# Patient Record
Sex: Male | Born: 1945
Health system: Southern US, Community
[De-identification: ages and names within clinical notes are randomized; demographics above are authoritative.]

## PROBLEM LIST (undated history)

## (undated) ENCOUNTER — Emergency Department (HOSPITAL_COMMUNITY): Admission: EM | Payer: Medicare Other

## (undated) ENCOUNTER — Emergency Department (HOSPITAL_COMMUNITY): Payer: Medicare Other

## (undated) DIAGNOSIS — Z8719 Personal history of other diseases of the digestive system: Secondary | ICD-10-CM

## (undated) DIAGNOSIS — F1910 Other psychoactive substance abuse, uncomplicated: Secondary | ICD-10-CM

## (undated) DIAGNOSIS — I10 Essential (primary) hypertension: Secondary | ICD-10-CM

## (undated) DIAGNOSIS — E119 Type 2 diabetes mellitus without complications: Secondary | ICD-10-CM

## (undated) DIAGNOSIS — M1990 Unspecified osteoarthritis, unspecified site: Secondary | ICD-10-CM

## (undated) DIAGNOSIS — F32A Depression, unspecified: Secondary | ICD-10-CM

## (undated) DIAGNOSIS — F329 Major depressive disorder, single episode, unspecified: Secondary | ICD-10-CM

## (undated) DIAGNOSIS — R569 Unspecified convulsions: Secondary | ICD-10-CM

## (undated) DIAGNOSIS — Z8711 Personal history of peptic ulcer disease: Secondary | ICD-10-CM

## (undated) DIAGNOSIS — C801 Malignant (primary) neoplasm, unspecified: Secondary | ICD-10-CM

## (undated) DIAGNOSIS — D649 Anemia, unspecified: Secondary | ICD-10-CM

## (undated) HISTORY — DX: Personal history of other diseases of the digestive system: Z87.19

## (undated) HISTORY — DX: Other psychoactive substance abuse, uncomplicated: F19.10

## (undated) HISTORY — DX: Anemia, unspecified: D64.9

## (undated) HISTORY — DX: Major depressive disorder, single episode, unspecified: F32.9

## (undated) HISTORY — PX: OTHER SURGICAL HISTORY: SHX169

## (undated) HISTORY — DX: Depression, unspecified: F32.A

## (undated) HISTORY — DX: Unspecified convulsions: R56.9

## (undated) HISTORY — DX: Unspecified osteoarthritis, unspecified site: M19.90

## (undated) HISTORY — DX: Malignant (primary) neoplasm, unspecified: C80.1

## (undated) HISTORY — DX: Type 2 diabetes mellitus without complications: E11.9

## (undated) HISTORY — DX: Personal history of peptic ulcer disease: Z87.11

## (undated) HISTORY — DX: Essential (primary) hypertension: I10

## (undated) HISTORY — PX: KNEE SURGERY: SHX244

---

## 2004-10-24 ENCOUNTER — Ambulatory Visit: Payer: Self-pay | Admitting: Family Medicine

## 2004-10-24 ENCOUNTER — Ambulatory Visit: Payer: Self-pay | Admitting: Internal Medicine

## 2004-10-31 ENCOUNTER — Ambulatory Visit: Payer: Self-pay | Admitting: Internal Medicine

## 2004-10-31 ENCOUNTER — Ambulatory Visit (HOSPITAL_COMMUNITY): Admission: RE | Admit: 2004-10-31 | Discharge: 2004-10-31 | Payer: Self-pay | Admitting: Internal Medicine

## 2004-11-03 ENCOUNTER — Ambulatory Visit: Payer: Self-pay | Admitting: *Deleted

## 2004-11-28 ENCOUNTER — Ambulatory Visit: Payer: Self-pay | Admitting: Internal Medicine

## 2004-11-30 ENCOUNTER — Ambulatory Visit: Payer: Self-pay | Admitting: Internal Medicine

## 2005-05-19 ENCOUNTER — Ambulatory Visit: Payer: Self-pay | Admitting: Internal Medicine

## 2005-07-03 ENCOUNTER — Ambulatory Visit: Payer: Self-pay | Admitting: Internal Medicine

## 2006-02-09 ENCOUNTER — Ambulatory Visit: Payer: Self-pay | Admitting: Internal Medicine

## 2008-02-11 ENCOUNTER — Ambulatory Visit: Payer: Self-pay | Admitting: Internal Medicine

## 2008-02-11 DIAGNOSIS — K029 Dental caries, unspecified: Secondary | ICD-10-CM | POA: Insufficient documentation

## 2008-02-11 DIAGNOSIS — F1010 Alcohol abuse, uncomplicated: Secondary | ICD-10-CM | POA: Insufficient documentation

## 2008-02-11 DIAGNOSIS — M069 Rheumatoid arthritis, unspecified: Secondary | ICD-10-CM | POA: Insufficient documentation

## 2008-02-13 DIAGNOSIS — I10 Essential (primary) hypertension: Secondary | ICD-10-CM | POA: Insufficient documentation

## 2008-02-22 LAB — CONVERTED CEMR LAB
ANA Titer 1: 1:160 {titer} — ABNORMAL HIGH
Anti Nuclear Antibody(ANA): POSITIVE — AB
Rheumatoid fact SerPl-aCnc: <20 intl units/mL (ref 0–20)
Sed Rate: 7 mm/hr (ref 0–16)

## 2008-05-29 ENCOUNTER — Ambulatory Visit: Payer: Self-pay | Admitting: Internal Medicine

## 2008-05-29 DIAGNOSIS — R1013 Epigastric pain: Secondary | ICD-10-CM

## 2008-05-29 LAB — CONVERTED CEMR LAB
Bilirubin Urine: NEGATIVE
Blood in Urine, dipstick: NEGATIVE
Glucose, Urine, Semiquant: NEGATIVE
Ketones, urine, test strip: NEGATIVE
Nitrite: NEGATIVE
Protein, U semiquant: 30
Specific Gravity, Urine: 1.02
Urobilinogen, UA: 0.2
WBC Urine, dipstick: NEGATIVE
pH: 5

## 2008-06-05 LAB — CONVERTED CEMR LAB
ALT: 35 units/L (ref 0–53)
AST: 66 units/L — ABNORMAL HIGH (ref 0–37)
Albumin: 4.5 g/dL (ref 3.5–5.2)
Alkaline Phosphatase: 93 units/L (ref 39–117)
BUN: 5 mg/dL — ABNORMAL LOW (ref 6–23)
Basophils Absolute: 0 10*3/uL (ref 0.0–0.1)
Basophils Relative: 1 % (ref 0–1)
CO2: 21 meq/L (ref 19–32)
Calcium: 8.6 mg/dL (ref 8.4–10.5)
Chloride: 103 meq/L (ref 96–112)
Cholesterol: 147 mg/dL (ref 0–200)
Creatinine, Ser: 0.63 mg/dL (ref 0.40–1.50)
Eosinophils Absolute: 0.1 10*3/uL (ref 0.0–0.7)
Eosinophils Relative: 1 % (ref 0–5)
Glucose, Bld: 94 mg/dL (ref 70–99)
HCT: 36.2 % — ABNORMAL LOW (ref 39.0–52.0)
HDL: 63 mg/dL (ref >39)
Hemoglobin: 11.6 g/dL — ABNORMAL LOW (ref 13.0–17.0)
LDL Cholesterol: 21 mg/dL (ref 0–99)
Lymphocytes Relative: 65 % — ABNORMAL HIGH (ref 12–46)
Lymphs Abs: 3.8 10*3/uL (ref 0.7–4.0)
MCHC: 32 g/dL (ref 30.0–36.0)
MCV: 105.8 fL — ABNORMAL HIGH (ref 78.0–100.0)
Monocytes Absolute: 0.3 10*3/uL (ref 0.1–1.0)
Monocytes Relative: 6 % (ref 3–12)
Neutro Abs: 1.6 10*3/uL — ABNORMAL LOW (ref 1.7–7.7)
Neutrophils Relative %: 28 % — ABNORMAL LOW (ref 43–77)
PSA: 3 ng/mL (ref 0.10–4.00)
Platelets: 202 10*3/uL (ref 150–400)
Potassium: 4.2 meq/L (ref 3.5–5.3)
RBC: 3.42 M/uL — ABNORMAL LOW (ref 4.22–5.81)
RDW: 17.3 % — ABNORMAL HIGH (ref 11.5–15.5)
Sodium: 144 meq/L (ref 135–145)
Total Bilirubin: 1 mg/dL (ref 0.3–1.2)
Total CHOL/HDL Ratio: 2.3
Total Protein: 8.3 g/dL (ref 6.0–8.3)
Triglycerides: 315 mg/dL — ABNORMAL HIGH (ref <150)
VLDL: 63 mg/dL — ABNORMAL HIGH (ref 0–40)
WBC: 5.8 10*3/uL (ref 4.0–10.5)

## 2008-06-09 ENCOUNTER — Telehealth (INDEPENDENT_AMBULATORY_CARE_PROVIDER_SITE_OTHER): Payer: Self-pay | Admitting: Internal Medicine

## 2008-06-10 ENCOUNTER — Ambulatory Visit: Payer: Self-pay | Admitting: Internal Medicine

## 2008-06-10 LAB — CONVERTED CEMR LAB
Ferritin: 1386 ng/mL — ABNORMAL HIGH (ref 22–322)
Iron: 26 ug/dL — ABNORMAL LOW (ref 42–165)
Iron: 26 ug/dL — ABNORMAL LOW (ref 42–165)
RBC Folate: 413 ng/mL (ref 180–600)
Saturation Ratios: 11 % — ABNORMAL LOW (ref 20–55)
Saturation Ratios: 11 % — ABNORMAL LOW (ref 20–55)
TIBC: 242 ug/dL (ref 215–435)
TIBC: 242 ug/dL (ref 215–435)
UIBC: 216 ug/dL
UIBC: 216 ug/dL
Vitamin B-12: 920 pg/mL — ABNORMAL HIGH (ref 211–911)

## 2008-06-15 ENCOUNTER — Telehealth (INDEPENDENT_AMBULATORY_CARE_PROVIDER_SITE_OTHER): Payer: Self-pay | Admitting: Internal Medicine

## 2008-06-23 ENCOUNTER — Ambulatory Visit: Payer: Self-pay | Admitting: Internal Medicine

## 2008-06-23 DIAGNOSIS — D509 Iron deficiency anemia, unspecified: Secondary | ICD-10-CM

## 2008-06-26 ENCOUNTER — Telehealth (INDEPENDENT_AMBULATORY_CARE_PROVIDER_SITE_OTHER): Payer: Self-pay | Admitting: Internal Medicine

## 2008-07-09 ENCOUNTER — Emergency Department: Payer: Self-pay | Admitting: Emergency Medicine

## 2008-07-13 ENCOUNTER — Emergency Department (HOSPITAL_COMMUNITY): Admission: EM | Admit: 2008-07-13 | Discharge: 2008-07-13 | Payer: Self-pay | Admitting: Emergency Medicine

## 2008-07-14 ENCOUNTER — Telehealth (INDEPENDENT_AMBULATORY_CARE_PROVIDER_SITE_OTHER): Payer: Self-pay | Admitting: Internal Medicine

## 2008-07-14 ENCOUNTER — Encounter (INDEPENDENT_AMBULATORY_CARE_PROVIDER_SITE_OTHER): Payer: Self-pay | Admitting: *Deleted

## 2008-07-20 ENCOUNTER — Telehealth (INDEPENDENT_AMBULATORY_CARE_PROVIDER_SITE_OTHER): Payer: Self-pay | Admitting: Internal Medicine

## 2008-07-21 ENCOUNTER — Emergency Department (HOSPITAL_COMMUNITY): Admission: EM | Admit: 2008-07-21 | Discharge: 2008-07-22 | Payer: Self-pay | Admitting: Emergency Medicine

## 2008-07-21 ENCOUNTER — Emergency Department (HOSPITAL_COMMUNITY): Admission: EM | Admit: 2008-07-21 | Discharge: 2008-07-21 | Payer: Self-pay | Admitting: Emergency Medicine

## 2008-07-22 ENCOUNTER — Encounter (INDEPENDENT_AMBULATORY_CARE_PROVIDER_SITE_OTHER): Payer: Self-pay | Admitting: Internal Medicine

## 2008-08-06 ENCOUNTER — Ambulatory Visit: Payer: Self-pay | Admitting: Internal Medicine

## 2008-09-11 ENCOUNTER — Ambulatory Visit: Payer: Self-pay | Admitting: Internal Medicine

## 2008-10-06 LAB — CONVERTED CEMR LAB
ALT: 19 units/L (ref 0–53)
AST: 25 units/L (ref 0–37)
Albumin: 4 g/dL (ref 3.5–5.2)
Alkaline Phosphatase: 81 units/L (ref 39–117)
BUN: 8 mg/dL (ref 6–23)
Basophils Absolute: 0 10*3/uL (ref 0.0–0.1)
Basophils Relative: 1 % (ref 0–1)
CO2: 25 meq/L (ref 19–32)
Calcium: 9.2 mg/dL (ref 8.4–10.5)
Chloride: 103 meq/L (ref 96–112)
Creatinine, Ser: 0.7 mg/dL (ref 0.40–1.50)
Eosinophils Absolute: 0 10*3/uL (ref 0.0–0.7)
Eosinophils Relative: 1 % (ref 0–5)
Glucose, Bld: 86 mg/dL (ref 70–99)
HCT: 36.2 % — ABNORMAL LOW (ref 39.0–52.0)
Hemoglobin: 10.8 g/dL — ABNORMAL LOW (ref 13.0–17.0)
Lymphocytes Relative: 38 % (ref 12–46)
Lymphs Abs: 2.3 10*3/uL (ref 0.7–4.0)
MCHC: 29.8 g/dL — ABNORMAL LOW (ref 30.0–36.0)
MCV: 97.8 fL (ref 78.0–100.0)
Monocytes Absolute: 0.6 10*3/uL (ref 0.1–1.0)
Monocytes Relative: 10 % (ref 3–12)
Neutro Abs: 3.2 10*3/uL (ref 1.7–7.7)
Neutrophils Relative %: 52 % (ref 43–77)
Platelets: 229 10*3/uL (ref 150–400)
Potassium: 4.3 meq/L (ref 3.5–5.3)
RBC: 3.7 M/uL — ABNORMAL LOW (ref 4.22–5.81)
RDW: 14.1 % (ref 11.5–15.5)
Sodium: 143 meq/L (ref 135–145)
Total Bilirubin: 0.7 mg/dL (ref 0.3–1.2)
Total Protein: 7.3 g/dL (ref 6.0–8.3)
WBC: 6.2 10*3/uL (ref 4.0–10.5)

## 2009-06-11 ENCOUNTER — Encounter: Payer: Self-pay | Admitting: Family Medicine

## 2009-06-11 ENCOUNTER — Ambulatory Visit: Payer: Self-pay | Admitting: Internal Medicine

## 2009-06-11 DIAGNOSIS — M25569 Pain in unspecified knee: Secondary | ICD-10-CM | POA: Insufficient documentation

## 2009-06-11 DIAGNOSIS — M542 Cervicalgia: Secondary | ICD-10-CM | POA: Insufficient documentation

## 2009-06-11 DIAGNOSIS — R413 Other amnesia: Secondary | ICD-10-CM | POA: Insufficient documentation

## 2009-06-14 DIAGNOSIS — E079 Disorder of thyroid, unspecified: Secondary | ICD-10-CM | POA: Insufficient documentation

## 2009-06-16 ENCOUNTER — Encounter (INDEPENDENT_AMBULATORY_CARE_PROVIDER_SITE_OTHER): Payer: Self-pay | Admitting: Internal Medicine

## 2009-06-17 LAB — CONVERTED CEMR LAB
Free T4: 0.94 ng/dL
T3 Uptake Ratio: 32.7 %

## 2009-06-25 LAB — CONVERTED CEMR LAB
ALT: 22 units/L (ref 0–53)
AST: 25 units/L (ref 0–37)
Albumin: 3.7 g/dL (ref 3.5–5.2)
Alkaline Phosphatase: 65 units/L (ref 39–117)
BUN: 9 mg/dL (ref 6–23)
Basophils Absolute: 0 10*3/uL (ref 0.0–0.1)
Basophils Relative: 0 % (ref 0–1)
CO2: 26 meq/L (ref 19–32)
Calcium: 9.1 mg/dL (ref 8.4–10.5)
Chloride: 99 meq/L (ref 96–112)
Cholesterol: 144 mg/dL (ref 0–200)
Creatinine, Ser: 0.63 mg/dL (ref 0.40–1.50)
Eosinophils Absolute: 0.1 10*3/uL (ref 0.0–0.7)
Eosinophils Relative: 2 % (ref 0–5)
Glucose, Bld: 82 mg/dL (ref 70–99)
HCT: 37.3 % — ABNORMAL LOW (ref 39.0–52.0)
HDL: 27 mg/dL — ABNORMAL LOW (ref >39)
Hemoglobin: 11.7 g/dL — ABNORMAL LOW (ref 13.0–17.0)
LDL Cholesterol: 103 mg/dL — ABNORMAL HIGH (ref 0–99)
Lymphocytes Relative: 34 % (ref 12–46)
Lymphs Abs: 1.8 10*3/uL (ref 0.7–4.0)
MCHC: 31.4 g/dL (ref 30.0–36.0)
MCV: 100 fL (ref 78.0–100.0)
Monocytes Absolute: 0.6 10*3/uL (ref 0.1–1.0)
Monocytes Relative: 11 % (ref 3–12)
Neutro Abs: 2.9 10*3/uL (ref 1.7–7.7)
Neutrophils Relative %: 53 % (ref 43–77)
Platelets: 238 10*3/uL (ref 150–400)
Potassium: 3.9 meq/L (ref 3.5–5.3)
RBC: 3.73 M/uL — ABNORMAL LOW (ref 4.22–5.81)
RDW: 13.1 % (ref 11.5–15.5)
Sodium: 141 meq/L (ref 135–145)
TSH: 5.173 microintl units/mL — ABNORMAL HIGH (ref 0.350–4.500)
Total Bilirubin: 0.9 mg/dL (ref 0.3–1.2)
Total CHOL/HDL Ratio: 5.3
Total Protein: 7.7 g/dL (ref 6.0–8.3)
Triglycerides: 70 mg/dL (ref <150)
VLDL: 14 mg/dL (ref 0–40)
WBC: 5.5 10*3/uL (ref 4.0–10.5)

## 2009-06-29 ENCOUNTER — Ambulatory Visit: Payer: Self-pay | Admitting: Internal Medicine

## 2009-07-08 ENCOUNTER — Encounter (INDEPENDENT_AMBULATORY_CARE_PROVIDER_SITE_OTHER): Payer: Self-pay | Admitting: Internal Medicine

## 2009-07-22 LAB — CONVERTED CEMR LAB: T3, Free: 2.5 pg/mL (ref 2.3–4.2)

## 2011-09-15 LAB — CULTURE, BLOOD (ROUTINE X 2)
Culture: NO GROWTH
Culture: NO GROWTH

## 2011-09-15 LAB — DIFFERENTIAL
Basophils Absolute: 0.1
Basophils Absolute: 0.1
Basophils Relative: 1
Basophils Relative: 1
Eosinophils Absolute: 0
Eosinophils Absolute: 0.1
Eosinophils Relative: 0
Eosinophils Relative: 1
Lymphocytes Relative: 15
Lymphocytes Relative: 27
Lymphs Abs: 1.4
Lymphs Abs: 2.1
Monocytes Absolute: 1.4 — ABNORMAL HIGH
Monocytes Absolute: 0.5
Monocytes Relative: 15 — ABNORMAL HIGH
Monocytes Relative: 6
Neutro Abs: 6.5
Neutro Abs: 4.9
Neutrophils Relative %: 69
Neutrophils Relative %: 64

## 2011-09-15 LAB — POCT I-STAT, CHEM 8
BUN: 9
Calcium, Ion: 1.17
Chloride: 99
Creatinine, Ser: 0.8
Glucose, Bld: 132 — ABNORMAL HIGH
HCT: 31 — ABNORMAL LOW
Hemoglobin: 10.5 — ABNORMAL LOW
Potassium: 3.4 — ABNORMAL LOW
Sodium: 140
TCO2: 30

## 2011-09-15 LAB — CBC
HCT: 28.3 — ABNORMAL LOW
HCT: 27.6 — ABNORMAL LOW
Hemoglobin: 9.3 — ABNORMAL LOW
Hemoglobin: 9.1 — ABNORMAL LOW
MCHC: 33
MCHC: 32.8
MCV: 104.3 — ABNORMAL HIGH
MCV: 102 — ABNORMAL HIGH
Platelets: 196
Platelets: 503 — ABNORMAL HIGH
RBC: 2.71 — ABNORMAL LOW
RBC: 2.71 — ABNORMAL LOW
RDW: 18.1 — ABNORMAL HIGH
RDW: 19.6 — ABNORMAL HIGH
WBC: 9.3
WBC: 7.6

## 2011-09-15 LAB — SYNOVIAL CELL COUNT + DIFF, W/ CRYSTALS
Eosinophils-Synovial: 0
Lymphocytes-Synovial Fld: 1
Monocyte-Macrophage-Synovial Fluid: 19 — ABNORMAL LOW
Neutrophil, Synovial: 80 — ABNORMAL HIGH
Other Cells-SYN: 0
WBC, Synovial: 30000 — ABNORMAL HIGH

## 2011-09-15 LAB — PROTEIN, BODY FLUID: Total protein, fluid: 4.5

## 2011-09-15 LAB — URINALYSIS, ROUTINE W REFLEX MICROSCOPIC
Bilirubin Urine: NEGATIVE
Glucose, UA: NEGATIVE
Glucose, UA: NEGATIVE
Hgb urine dipstick: NEGATIVE
Ketones, ur: NEGATIVE
Ketones, ur: NEGATIVE
Nitrite: NEGATIVE
Nitrite: NEGATIVE
Protein, ur: NEGATIVE
Protein, ur: NEGATIVE
Specific Gravity, Urine: 1.011
Urobilinogen, UA: 4 — ABNORMAL HIGH
pH: 6
pH: 6.5

## 2011-09-15 LAB — COMPREHENSIVE METABOLIC PANEL
ALT: 21
AST: 53 — ABNORMAL HIGH
Albumin: 3.1 — ABNORMAL LOW
Alkaline Phosphatase: 65
BUN: 5 — ABNORMAL LOW
CO2: 26
Calcium: 7.2 — ABNORMAL LOW
Chloride: 88 — ABNORMAL LOW
Creatinine, Ser: 0.7
GFR calc Af Amer: >60
GFR calc non Af Amer: >60
Glucose, Bld: 112 — ABNORMAL HIGH
Potassium: 2.9 — ABNORMAL LOW
Sodium: 127 — ABNORMAL LOW
Total Bilirubin: 1.9 — ABNORMAL HIGH
Total Protein: 7.4

## 2011-09-15 LAB — GRAM STAIN

## 2011-09-15 LAB — BODY FLUID CULTURE: Culture: NO GROWTH

## 2011-09-15 LAB — GLUCOSE, SEROUS FLUID: Glucose, Fluid: 73

## 2011-09-15 LAB — OCCULT BLOOD X 1 CARD TO LAB, STOOL: Fecal Occult Bld: NEGATIVE

## 2015-12-19 DIAGNOSIS — C801 Malignant (primary) neoplasm, unspecified: Secondary | ICD-10-CM

## 2015-12-19 HISTORY — DX: Malignant (primary) neoplasm, unspecified: C80.1

## 2016-09-21 DIAGNOSIS — N401 Enlarged prostate with lower urinary tract symptoms: Secondary | ICD-10-CM | POA: Diagnosis not present

## 2016-09-21 DIAGNOSIS — R972 Elevated prostate specific antigen [PSA]: Secondary | ICD-10-CM | POA: Diagnosis not present

## 2016-09-28 ENCOUNTER — Encounter: Payer: Self-pay | Admitting: Physician Assistant

## 2016-09-28 DIAGNOSIS — R972 Elevated prostate specific antigen [PSA]: Secondary | ICD-10-CM | POA: Diagnosis not present

## 2016-10-05 DIAGNOSIS — C61 Malignant neoplasm of prostate: Secondary | ICD-10-CM | POA: Diagnosis not present

## 2016-10-11 DIAGNOSIS — C61 Malignant neoplasm of prostate: Secondary | ICD-10-CM | POA: Diagnosis not present

## 2016-10-24 DIAGNOSIS — C61 Malignant neoplasm of prostate: Secondary | ICD-10-CM | POA: Diagnosis not present

## 2016-10-26 DIAGNOSIS — C61 Malignant neoplasm of prostate: Secondary | ICD-10-CM | POA: Diagnosis not present

## 2016-10-27 DIAGNOSIS — C61 Malignant neoplasm of prostate: Secondary | ICD-10-CM | POA: Diagnosis not present

## 2016-10-30 DIAGNOSIS — C61 Malignant neoplasm of prostate: Secondary | ICD-10-CM | POA: Diagnosis not present

## 2016-10-31 DIAGNOSIS — C61 Malignant neoplasm of prostate: Secondary | ICD-10-CM | POA: Diagnosis not present

## 2017-04-20 DIAGNOSIS — M7071 Other bursitis of hip, right hip: Secondary | ICD-10-CM | POA: Diagnosis not present

## 2017-04-20 DIAGNOSIS — M25551 Pain in right hip: Secondary | ICD-10-CM | POA: Diagnosis not present

## 2017-04-20 DIAGNOSIS — I10 Essential (primary) hypertension: Secondary | ICD-10-CM | POA: Diagnosis not present

## 2017-04-20 DIAGNOSIS — M160 Bilateral primary osteoarthritis of hip: Secondary | ICD-10-CM | POA: Diagnosis not present

## 2017-07-13 ENCOUNTER — Encounter: Payer: Self-pay | Admitting: Physician Assistant

## 2017-07-13 ENCOUNTER — Ambulatory Visit (INDEPENDENT_AMBULATORY_CARE_PROVIDER_SITE_OTHER): Payer: Medicare Other | Admitting: Physician Assistant

## 2017-07-13 VITALS — BP 148/78 | HR 74 | Temp 98.7°F | Resp 18 | Ht 74.0 in | Wt 158.8 lb

## 2017-07-13 DIAGNOSIS — I10 Essential (primary) hypertension: Secondary | ICD-10-CM

## 2017-07-13 MED ORDER — AMLODIPINE BESYLATE 2.5 MG PO TABS: 2.5000 mg | ORAL_TABLET | Freq: Every day | ORAL | 0 refills | Status: DC

## 2017-07-13 NOTE — Progress Notes (Signed)
07/16/2017 8:52 AM   DOB: 07/23/1946 / MRN: [id number]  SUBJECTIVE:  Brad [name] is a 71 y.o. male presenting to establish care. Has a history of prostate cancer but does not know what has been done for him. Tells me that he has 1-2 episodes of nocturia nightly. Has a history of diabetes. Is not sure if he has been on medication for this as well.  Tells me he has not really lost much weight in the last six month. Denies an blood in the stool, heartburn, epigastric pain, nausea, emesis. He eats well.  He lives with his sister and is new to [place]. Has had multiple "seizures" however this was not observed.  Has 1-2 beers daily.  Has a history of alcoholism.   Has a history of arthritis in hand, knees and hips and takes OTC meds and takes Aleve for this.     He has No Known Allergies.   He  has a past medical history of Anemia; Arthritis; Cancer ([place]) (2017); Depression; Diabetes mellitus without complication ([place]); History of stomach ulcers; Hypertension; Seizures ([place]); and Substance abuse.    He  reports that he has been smoking.  He has never used smokeless tobacco. He reports that he drinks about 1.2 - 1.8 oz of alcohol per week . He reports that he uses drugs, including Marijuana. He  has no sexual activity history on file. The patient  has a past surgical history that includes herniated disc and Knee surgery (Right).  His family history is not on file.  Review of Systems  Constitutional: Negative for chills, diaphoresis and fever.  Eyes: Negative.   Respiratory: Negative for cough, hemoptysis, sputum production, shortness of breath and wheezing.   Cardiovascular: Negative for chest pain, orthopnea and leg swelling.  Gastrointestinal: Negative for abdominal pain, blood in stool, constipation, diarrhea, heartburn, melena, nausea and vomiting.  Genitourinary: Negative for flank pain.  Skin: Negative for rash.  Neurological: Negative for dizziness, sensory change, speech change,  focal weakness and headaches.    The problem list and medications were reviewed and updated by myself where necessary and exist elsewhere in the encounter.   OBJECTIVE:  BP (!) 148/78   Pulse 74   Temp 98.7 F (37.1 C) (Oral)   Resp 18   Ht '6\' 2"'  (1.88 m)   Wt 158 lb 12.8 oz (72 kg)   SpO2 99%   BMI 20.39 kg/m   Physical Exam  Constitutional: He is oriented to person, place, and time. He appears well-developed. He is active and cooperative.  Non-toxic appearance.  HENT:  Right Ear: Hearing, tympanic membrane, external ear and ear canal normal.  Left Ear: Hearing, tympanic membrane, external ear and ear canal normal.  Nose: Nose normal. Right sinus exhibits no maxillary sinus tenderness and no frontal sinus tenderness. Left sinus exhibits no maxillary sinus tenderness and no frontal sinus tenderness.  Mouth/Throat: Uvula is midline, oropharynx is clear and moist and mucous membranes are normal. No oropharyngeal exudate, posterior oropharyngeal edema or tonsillar abscesses.  Eyes: Pupils are equal, round, and reactive to light. Conjunctivae and EOM are normal.  Cardiovascular: Normal rate, regular rhythm, S1 normal, S2 normal, normal heart sounds, intact distal pulses and normal pulses.  Exam reveals no gallop and no friction rub.   No murmur heard. Pulmonary/Chest: Effort normal. No stridor. No tachypnea. No respiratory distress. He has no wheezes. He has no rales.  Abdominal: He exhibits no distension.  Musculoskeletal: He exhibits no edema.  Lymphadenopathy:  Head (right side): No submandibular and no tonsillar adenopathy present.       Head (left side): No submandibular and no tonsillar adenopathy present.    He has no cervical adenopathy.  Neurological: He is alert and oriented to person, place, and time. He has normal strength and normal reflexes. He is not disoriented. No cranial nerve deficit or sensory deficit. He exhibits normal muscle tone. Coordination and gait  normal.  Skin: Skin is warm and dry. He is not diaphoretic. No pallor.  Psychiatric: His behavior is normal. Thought content normal. His mood appears not anxious. His affect is not angry. He exhibits a depressed mood.  Vitals reviewed.   Results for orders placed or performed in visit on 07/13/17 (from the past 72 hour(s))  CBC     Status: Abnormal   Collection Time: 07/13/17 12:53 PM  Result Value Ref Range   WBC 7.1 3.4 - 10.8 x10E3/uL   RBC 4.07 (L) 4.14 - 5.80 x10E6/uL   Hemoglobin 12.5 (L) 13.0 - 17.7 g/dL   Hematocrit 38.7 37.5 - 51.0 %   MCV 95 79 - 97 fL   MCH 30.7 26.6 - 33.0 pg   MCHC 32.3 31.5 - 35.7 g/dL   RDW 12.6 12.3 - 15.4 %   Platelets 199 150 - 379 x10E3/uL  TSH     Status: None   Collection Time: 07/13/17 12:53 PM  Result Value Ref Range   TSH 3.610 0.450 - 4.500 uIU/mL  CMP14+EGFR     Status: None   Collection Time: 07/13/17 12:53 PM  Result Value Ref Range   Glucose 85 65 - 99 mg/dL   BUN 12 8 - 27 mg/dL   Creatinine, Ser 1.00 0.76 - 1.27 mg/dL   GFR calc non Af Amer 76 >59 mL/min/1.73   GFR calc Af Amer 88 >59 mL/min/1.73   BUN/Creatinine Ratio 12 10 - 24   Sodium 142 134 - 144 mmol/L   Potassium 4.0 3.5 - 5.2 mmol/L   Chloride 101 96 - 106 mmol/L   CO2 23 20 - 29 mmol/L   Calcium 9.6 8.6 - 10.2 mg/dL   Total Protein 7.8 6.0 - 8.5 g/dL   Albumin 4.5 3.6 - 4.8 g/dL   Globulin, Total 3.3 1.5 - 4.5 g/dL   Albumin/Globulin Ratio 1.4 1.2 - 2.2   Bilirubin Total 0.6 0.0 - 1.2 mg/dL   Alkaline Phosphatase 100 39 - 117 IU/L   AST 17 0 - 40 IU/L   ALT 11 0 - 44 IU/L  Lipid panel     Status: Abnormal   Collection Time: 07/13/17 12:53 PM  Result Value Ref Range   Cholesterol, Total 215 (H) 100 - 199 mg/dL   Triglycerides 66 0 - 149 mg/dL   HDL 57 >39 mg/dL   VLDL Cholesterol Cal 13 5 - 40 mg/dL   LDL Calculated 145 (H) 0 - 99 mg/dL   Chol/HDL Ratio 3.8 0.0 - 5.0 ratio    Comment:                                   T. Chol/HDL Ratio                                              Men  Women  1/2 Avg.Risk  3.4    3.3                                   Avg.Risk  5.0    4.4                                2X Avg.Risk  9.6    7.1                                3X Avg.Risk 23.4   11.0      No results found.  ASSESSMENT AND PLAN:  Alquan was seen today for establish care.  Diagnoses and all orders for this visit:  Hypertension, unspecified type: 86 year cachectic male here today to establish care.  He likely has some mild MR and has been under the care of his brothers and sisters his entire life.  I will check some basic labs and see him back in a week.  I have asked his sister who is with him today to gather as much medical information as possible as I have no previous work up for the problems discussed in HPI.  Will start him on low dose norvasc and recheck pressure next week.  -     CBC -     TSH -     CMP14+EGFR -     amLODipine (NORVASC) 2.5 MG tablet; Take 1 tablet (2.5 mg total) by mouth daily. -     Lipid panel -     Lipid panel    The patient is advised to call or return to clinic if he does not see an improvement in symptoms, or to seek the care of the closest emergency department if he worsens with the above plan.   Philis Fendt, MHS, PA-C [facility] at [facility] 07/16/2017 8:52 AM

## 2017-07-13 NOTE — Patient Instructions (Signed)
Take 1000 mg of tylenol every 8 hours for aches and pain as needed. Avoid Aleve, Ibuprofen, and aspirin and Goodies.

## 2017-07-14 LAB — CBC
HEMOGLOBIN: 12.5 g/dL — AB (ref 13.0–17.7)
Hematocrit: 38.7 % (ref 37.5–51.0)
MCH: 30.7 pg (ref 26.6–33.0)
MCHC: 32.3 g/dL (ref 31.5–35.7)
MCV: 95 fL (ref 79–97)
Platelets: 199 10*3/uL (ref 150–379)
RBC: 4.07 x10E6/uL — AB (ref 4.14–5.80)
RDW: 12.6 % (ref 12.3–15.4)
WBC: 7.1 10*3/uL (ref 3.4–10.8)

## 2017-07-14 LAB — CMP14+EGFR
ALK PHOS: 100 IU/L (ref 39–117)
ALT: 11 IU/L (ref 0–44)
AST: 17 IU/L (ref 0–40)
Albumin/Globulin Ratio: 1.4 (ref 1.2–2.2)
Albumin: 4.5 g/dL (ref 3.6–4.8)
BILIRUBIN TOTAL: 0.6 mg/dL (ref 0.0–1.2)
BUN/Creatinine Ratio: 12 (ref 10–24)
BUN: 12 mg/dL (ref 8–27)
CHLORIDE: 101 mmol/L (ref 96–106)
CO2: 23 mmol/L (ref 20–29)
Calcium: 9.6 mg/dL (ref 8.6–10.2)
Creatinine, Ser: 1 mg/dL (ref 0.76–1.27)
GFR calc Af Amer: 88 mL/min/{1.73_m2} (ref >59)
GFR calc non Af Amer: 76 mL/min/{1.73_m2} (ref >59)
GLOBULIN, TOTAL: 3.3 g/dL (ref 1.5–4.5)
Glucose: 85 mg/dL (ref 65–99)
POTASSIUM: 4 mmol/L (ref 3.5–5.2)
SODIUM: 142 mmol/L (ref 134–144)
Total Protein: 7.8 g/dL (ref 6.0–8.5)

## 2017-07-14 LAB — LIPID PANEL
CHOL/HDL RATIO: 3.8 ratio (ref 0.0–5.0)
Cholesterol, Total: 215 mg/dL — ABNORMAL HIGH (ref 100–199)
HDL: 57 mg/dL (ref >39)
LDL Calculated: 145 mg/dL — ABNORMAL HIGH (ref 0–99)
TRIGLYCERIDES: 66 mg/dL (ref 0–149)
VLDL Cholesterol Cal: 13 mg/dL (ref 5–40)

## 2017-07-14 LAB — TSH: TSH: 3.61 u[IU]/mL (ref 0.450–4.500)

## 2017-07-20 ENCOUNTER — Encounter: Payer: Self-pay | Admitting: Physician Assistant

## 2017-07-20 ENCOUNTER — Ambulatory Visit (INDEPENDENT_AMBULATORY_CARE_PROVIDER_SITE_OTHER): Payer: Medicare Other

## 2017-07-20 ENCOUNTER — Ambulatory Visit (INDEPENDENT_AMBULATORY_CARE_PROVIDER_SITE_OTHER): Payer: Medicare Other | Admitting: Physician Assistant

## 2017-07-20 VITALS — BP 132/70 | HR 79 | Temp 98.0°F | Resp 18 | Ht 71.65 in | Wt 160.0 lb

## 2017-07-20 DIAGNOSIS — G8929 Other chronic pain: Secondary | ICD-10-CM

## 2017-07-20 DIAGNOSIS — M79641 Pain in right hand: Secondary | ICD-10-CM | POA: Diagnosis not present

## 2017-07-20 DIAGNOSIS — Z136 Encounter for screening for cardiovascular disorders: Secondary | ICD-10-CM | POA: Diagnosis not present

## 2017-07-20 DIAGNOSIS — M79642 Pain in left hand: Secondary | ICD-10-CM | POA: Diagnosis not present

## 2017-07-20 DIAGNOSIS — M25561 Pain in right knee: Secondary | ICD-10-CM | POA: Diagnosis not present

## 2017-07-20 DIAGNOSIS — Z87898 Personal history of other specified conditions: Secondary | ICD-10-CM

## 2017-07-20 DIAGNOSIS — M25562 Pain in left knee: Secondary | ICD-10-CM | POA: Diagnosis not present

## 2017-07-20 MED ORDER — MELOXICAM 15 MG PO TABS
7.5000 mg | ORAL_TABLET | Freq: Every day | ORAL | 0 refills | Status: DC
Start: 2017-07-20 — End: 2017-10-17

## 2017-07-20 NOTE — Progress Notes (Signed)
07/20/2017 3:29 PM   DOB: October 13, 1946 / MRN: [id number]  SUBJECTIVE:  Brad [name] is a 71 y.o. male presenting for BP check.  Feels well today. Denies leg swelling today.  Has been taking amlodipine 2.5 mg since last visit.    Has been smoking for for 50 years but would only smoke 2-3 cigs daily.  Has a history of GERD but does not really have any symptoms today but has a difficulty time telling me what his symptoms are like.     Tells me he has a history of depression but this does not really bother him. Feels happy about 80% of the time.  He likes to fish and watch TV, particularly baseball and likes the Mets.  Goes to bed at about 11:30 pm nightly.  He tends to wake up at about 5:30 or 6.  Tells me he sleeps well.    He denies difficulty with ambulating long distances.   Tells me that he has been medicated for seizures in the past.   Takes tylenol and other OTC meds for pain.   He has No Known Allergies.   He  has a past medical history of Anemia; Arthritis; Cancer ([place]) (2017); Depression; Diabetes mellitus without complication ([place]); History of stomach ulcers; Hypertension; Seizures ([place]); and Substance abuse.    He  reports that he has been smoking.  He has never used smokeless tobacco. He reports that he drinks about 1.2 - 1.8 oz of alcohol per week . He reports that he uses drugs, including Marijuana. He  has no sexual activity history on file. The patient  has a past surgical history that includes herniated disc and Knee surgery (Right).  His family history is not on file.  Review of Systems  Constitutional: Negative for chills and fever.  Musculoskeletal: Positive for back pain and joint pain.  Skin: Negative for itching and rash.    The problem list and medications were reviewed and updated by myself where necessary and exist elsewhere in the encounter.   OBJECTIVE:  BP 132/70   Pulse 79   Temp 98 F (36.7 C) (Oral)   Resp 18   Ht 5' 11.65" (1.82 m)   Wt 160  lb (72.6 kg)   SpO2 98%   BMI 21.91 kg/m   BP Readings from Last 3 Encounters:  07/20/17 132/70  07/13/17 (!) 148/78  06/29/09 140/78   Lab Results  Component Value Date   WBC 7.1 07/13/2017   HGB 12.5 (L) 07/13/2017   HCT 38.7 07/13/2017   MCV 95 07/13/2017   PLT 199 07/13/2017    Lab Results  Component Value Date   NA 142 07/13/2017   K 4.0 07/13/2017   CL 101 07/13/2017   CO2 23 07/13/2017    Lab Results  Component Value Date   CREATININE 1.00 07/13/2017    Lab Results  Component Value Date   ALT 11 07/13/2017   AST 17 07/13/2017   ALKPHOS 100 07/13/2017   BILITOT 0.6 07/13/2017    Lab Results  Component Value Date   TSH 3.610 07/13/2017    Lab Results  Component Value Date   CHOL 215 (H) 07/13/2017   HDL 57 07/13/2017   LDLCALC 145 (H) 07/13/2017   TRIG 66 07/13/2017   CHOLHDL 3.8 07/13/2017    Physical Exam  Constitutional: He is oriented to person, place, and time. He appears well-developed. He is active and cooperative.  Non-toxic appearance.  Eyes: Pupils are equal,  round, and reactive to light. EOM are normal.  Cardiovascular: Normal rate.   Pulmonary/Chest: Effort normal. No stridor. No tachypnea. No respiratory distress. He has no wheezes. He has no rales.  Abdominal: Soft. Normal appearance and bowel sounds are normal. He exhibits no distension and no mass. There is no tenderness. There is no rigidity, no rebound, no guarding and no CVA tenderness. No hernia.  Musculoskeletal: He exhibits no edema, tenderness or deformity.       Legs: Neurological: He is alert and oriented to person, place, and time. He has normal strength and normal reflexes. He is not disoriented. No cranial nerve deficit or sensory deficit. He exhibits normal muscle tone. Coordination and gait normal.  Skin: Skin is warm and dry. He is not diaphoretic. No pallor.  Psychiatric: His behavior is normal.  Vitals reviewed.   No results found for this or any previous visit  (from the past 72 hour(s)).  Dg Knee 1-2 Views Left  Result Date: 07/20/2017 CLINICAL DATA:  Chronic left knee pain.  No reported injury. EXAM: LEFT KNEE - 1-2 VIEW COMPARISON:  None. FINDINGS: Elongated area calcification in the medullary cavity of the proximal tibia. Extensive arterial calcifications. No spur formation or effusion. IMPRESSION: 1. No acute abnormality. 2. Probable calcified infarct or enchondroma in the proximal tibia. 3. Extensive atheromatous arterial calcifications. Electronically Signed   By: Claudie Revering M.D.   On: 07/20/2017 15:05   Dg Knee 1-2 Views Right  Result Date: 07/20/2017 CLINICAL DATA:  Chronic knee pain EXAM: RIGHT KNEE - 1-2 VIEW COMPARISON:  None. FINDINGS: Previous patella fracture with ORIF including pins and cerclage wires. The fracture is healed. No noted accelerated patellofemoral osteoarthritis. There is geographic sclerosis in the proximal tibial metadiaphysis, also seen in the contralateral leg and consistent with bone infarct. Arterial calcification that is diffuse. IMPRESSION: 1. Remote patella fracture and ORIF. No evidence of posttraumatic osteoarthritis. 2. Proximal tibia bone infarct. Electronically Signed   By: Monte Fantasia M.D.   On: 07/20/2017 15:06   Dg Hand Complete Left  Result Date: 07/20/2017 CLINICAL DATA:  Chronic hand pain. EXAM: LEFT HAND - COMPLETE 3+ VIEW COMPARISON:  None. FINDINGS: Negative for fracture. No erosive changes or focal notable arthritic change. Osteopenia and arterial calcification IMPRESSION: 1. No acute or erosive finding.  No notable degenerative change. 2. Osteopenia and arterial calcification. Electronically Signed   By: Monte Fantasia M.D.   On: 07/20/2017 15:04   Dg Hand Complete Right  Result Date: 07/20/2017 CLINICAL DATA:  Chronic right hand pain.  No reported injury. EXAM: RIGHT HAND - COMPLETE 3+ VIEW COMPARISON:  None. FINDINGS: Extensive arterial calcifications. Otherwise, normal appearing bones and soft  tissues. IMPRESSION: No acute abnormality.  Atheromatous arterial calcifications. Electronically Signed   By: Claudie Revering M.D.   On: 07/20/2017 15:04    ASSESSMENT AND PLAN:  Thoma was seen today for medical management of chronic issues.  Diagnoses and all orders for this visit:  Other chronic pain: No findings of OA however he is showing osteopenia as well as arterial calcifications on all images.  [name] tells me he has been diagnosed with dementia and seizures in the past, however this may be secondary to alcohol.  I'd like him to see Dr. Jaynee Eagles or colleague for problem 3 and for further quantification of dementia so he can be followed. Of note, Hilmar does seem to get a little deffensive when trying to obtain an specific HPI as he will generally only answer in  generalities, however he is not upset, and tells me he just has a difficult time giving specifics.I continue to wait on his medical history to arrive.  -     DG Hand Complete Left; Future -     DG Hand Complete Right; Future -     DG Knee 1-2 Views Left; Future -     DG Knee 1-2 Views Right; Future       -     meloxicam (MOBIC) 15 MG tablet; Take 0.5-1 tablets (7.5-15 mg total) by mouth daily.       Take with food. Do not take Ibuprofen, Goody's, or Aleve while taking this medication.  Screening for cardiovascular condition: EKG NSR without hyptertorphy, infarction, q-waves.  -     EKG 12-Lead  History of seizure: See problem 1.  -     Ambulatory referral to Neurology     The patient is advised to call or return to clinic if he does not see an improvement in symptoms, or to seek the care of the closest emergency department if he worsens with the above plan.   Philis Fendt, MHS, PA-C [facility] at [facility] 07/20/2017 3:29 PM

## 2017-07-20 NOTE — Patient Instructions (Addendum)
     IF you received an x-ray today, you will receive an invoice from [facility]. Please contact [facility] at [phone] with questions or concerns regarding your invoice.   IF you received labwork today, you will receive an invoice from [facility]. Please contact [facility] at [phone] with questions or concerns regarding your invoice.   Our billing staff will not be able to assist you with questions regarding bills from these companies.  You will be contacted with the lab results as soon as they are available. The fastest way to get your results is to activate your My Chart account. Instructions are located on the last page of this paperwork. If you have not heard from us regarding the results in 2 weeks, please contact this office.     

## 2017-08-06 ENCOUNTER — Other Ambulatory Visit: Payer: Self-pay | Admitting: Physician Assistant

## 2017-08-06 DIAGNOSIS — C61 Malignant neoplasm of prostate: Secondary | ICD-10-CM

## 2017-08-06 NOTE — Progress Notes (Signed)
     Documentation from Brad [name] received confirming history of prostate cancer with biopsy.  Will refer to [facility].   Lab Results  Component Value Date   PSA 3.00 05/29/2008

## 2017-08-17 ENCOUNTER — Encounter: Payer: Self-pay | Admitting: Family Medicine

## 2017-08-17 ENCOUNTER — Ambulatory Visit (INDEPENDENT_AMBULATORY_CARE_PROVIDER_SITE_OTHER): Payer: Medicare Other | Admitting: Family Medicine

## 2017-08-17 VITALS — BP 162/80 | HR 64 | Temp 98.0°F | Resp 18 | Ht 71.6 in | Wt 160.0 lb

## 2017-08-17 DIAGNOSIS — R238 Other skin changes: Secondary | ICD-10-CM | POA: Diagnosis not present

## 2017-08-17 NOTE — Patient Instructions (Signed)
     IF you received an x-ray today, you will receive an invoice from [facility]. Please contact  Radiology at [phone] with questions or concerns regarding your invoice.   IF you received labwork today, you will receive an invoice from [facility]. Please contact [facility] at [phone] with questions or concerns regarding your invoice.   Our billing staff will not be able to assist you with questions regarding bills from these companies.  You will be contacted with the lab results as soon as they are available. The fastest way to get your results is to activate your My Chart account. Instructions are located on the last page of this paperwork. If you have not heard from us regarding the results in 2 weeks, please contact this office.     

## 2017-08-17 NOTE — Progress Notes (Signed)
8/31/20182:31 PM  Brad [name] [birth year], 71 y.o. male [id number]  Chief Complaint  Patient presents with  . Blisters    on ankles/feet, per patient sister, noticed appx 1 week ago; patient states that some have burst, white drainage    HPI:   Patient is a 71 y.o. male who presents today for about a week of blisters on his left ankle, originally just had 2-3 but during the past 3 days there has been significant increase in blisters. He states that they do not hurt nor itch. He has never had similar. He denies any recent exposures, trauma, new medications.  Depression screen [facility] 2/9 08/17/2017 07/20/2017 07/13/2017  Decreased Interest 0 0 1  Down, Depressed, Hopeless 1 0 1  PHQ - 2 Score 1 0 2  Altered sleeping 1 - 1  Tired, decreased energy 1 - 1  Change in appetite 1 - 1  Feeling bad or failure about yourself  0 - 0  Trouble concentrating 0 - 0  Moving slowly or fidgety/restless 0 - 0  Suicidal thoughts 0 - 0  PHQ-9 Score 4 - 5  Difficult doing work/chores Somewhat difficult - Somewhat difficult    No Known Allergies  Current Outpatient Prescriptions on File Prior to Visit  Medication Sig Dispense Refill  . amLODipine (NORVASC) 2.5 MG tablet Take 1 tablet (2.5 mg total) by mouth daily. 90 tablet 0  . meloxicam (MOBIC) 15 MG tablet Take 0.5-1 tablets (7.5-15 mg total) by mouth daily. Take with food. Do not take Ibuprofen, Goody's, or Aleve while taking this medication. 60 tablet 0   No current facility-administered medications on file prior to visit.     Past Medical History:  Diagnosis Date  . Anemia   . Arthritis   . Cancer [facility]) 2017   prostate  . Depression   . Diabetes mellitus without complication ([place])   . History of stomach ulcers   . Hypertension   . Seizures ([place])   . Substance abuse     Past Surgical History:  Procedure Laterality Date  . herniated disc    . KNEE SURGERY Right     Social History  Substance Use Topics  . Smoking status:  Light Tobacco Smoker  . Smokeless tobacco: Never Used     Comment: occasional  . Alcohol use 1.2 - 1.8 oz/week    2 - 3 Cans of beer per week     Comment: occasional    No family history on file.  Review of Systems  Constitutional: Negative for chills, fever and malaise/fatigue.  Respiratory: Negative for cough and shortness of breath.   Cardiovascular: Positive for leg swelling. Negative for chest pain and palpitations.  Skin: Positive for rash.     OBJECTIVE:  Blood pressure (!) 162/80, pulse 64, temperature 98 F (36.7 C), temperature source Oral, resp. rate 18, height 5' 11.6" (1.819 m), weight 160 lb (72.6 kg), SpO2 98 %.  Physical Exam  Constitutional: He is oriented to person, place, and time and well-developed, well-nourished, and in no distress.  Musculoskeletal: He exhibits edema (trace pitting bilaterally).  Neurological: He is alert and oriented to person, place, and time.  Skin: Skin is warm and dry. No erythema.  Scattered vesicles and bullae coalescing clear filled covering Left foot and ankle      ASSESSMENT and PLAN:  1. Skin bulla Unknown etiology. Referring to derm. Discussed keeping blisters intact. - Ambulatory referral to Dermatology       [place],  MD Primary Care at [facility] [place], [place] [address] Ph.  [phone] Fax [phone]

## 2017-09-03 ENCOUNTER — Ambulatory Visit: Payer: Medicare Other | Admitting: Physician Assistant

## 2017-09-14 DIAGNOSIS — L139 Bullous disorder, unspecified: Secondary | ICD-10-CM | POA: Diagnosis not present

## 2017-10-03 ENCOUNTER — Encounter: Payer: Self-pay | Admitting: Physician Assistant

## 2017-10-03 ENCOUNTER — Ambulatory Visit (INDEPENDENT_AMBULATORY_CARE_PROVIDER_SITE_OTHER): Payer: Medicare Other | Admitting: Physician Assistant

## 2017-10-03 VITALS — BP 122/68 | HR 76 | Temp 97.9°F | Resp 18 | Ht 71.6 in | Wt 159.6 lb

## 2017-10-03 DIAGNOSIS — F0390 Unspecified dementia without behavioral disturbance: Secondary | ICD-10-CM

## 2017-10-03 DIAGNOSIS — R55 Syncope and collapse: Secondary | ICD-10-CM

## 2017-10-03 DIAGNOSIS — Z23 Encounter for immunization: Secondary | ICD-10-CM

## 2017-10-03 NOTE — Patient Instructions (Addendum)
  Lets get the head CT first, then will likely be starting you on a medication for depression called duloxetine which will also help with pain.    If you pain pill is not helping you enough then take 1000 mg of tylenol every 8 hours for pain as needed.    IF you received an x-ray today, you will receive an invoice from [facility]. Please contact [facility] at [phone] with questions or concerns regarding your invoice.   IF you received labwork today, you will receive an invoice from [facility]. Please contact [facility] at [phone] with questions or concerns regarding your invoice.   Our billing staff will not be able to assist you with questions regarding bills from these companies.  You will be contacted with the lab results as soon as they are available. The fastest way to get your results is to activate your My Chart account. Instructions are located on the last page of this paperwork. If you have not heard from [place] regarding the results in 2 weeks, please contact this office.

## 2017-10-03 NOTE — Progress Notes (Signed)
10/03/2017 11:11 AM   DOB: Aug 08, 1946 / MRN: [id number]  SUBJECTIVE:  Brad [name] is a 71 y.o. male presenting for for multiple complaints.   Brad [name], his sister is with him today and does most of the talking, which is what Brad [name] wants.  Tells me that he "done fell out twice" in the last month.  Once while at home sitting in the recliner and once while they were out fishing.  She tells me that she can get Brad [name] to "come around" but he seems a little disoriented.  His chart does reveal a history of seizure.  Brad [name] has full memory of both episodes, and both episodes were negative for clonus. Brad [name] tells me that "I just fell asleep" in regard to both episodes.  Brad [name] also thinks Brad [name] is depressed because he "just gets up, goes and sits in the recliner and only gets up to use the bathroom."  He likes to watch sports all day and loves to read the paper.  He tried to get Brad [name] to do most of the chores. He sleeps very late into the day and goes to bed late at night.   Brad [name] complains of joint pain. This has been persistent.   Is taking meloxicam.    Immunization History  Administered Date(s) Administered  . Influenza,inj,Quad PF,6+ Mos 10/03/2017  . Pneumococcal Polysaccharide-23 06/17/2005  . Td 01/18/2006    He has No Known Allergies.   He  has a past medical history of Anemia; Arthritis; Cancer ([place]) (2017); Depression; Diabetes mellitus without complication ([place]); History of stomach ulcers; Hypertension; Seizures ([place]); and Substance abuse ([place]).    He  reports that he has been smoking.  He has never used smokeless tobacco. He reports that he drinks about 1.2 - 1.8 oz of alcohol per week . He reports that he uses drugs, including Marijuana. He  has no sexual activity history on file. The patient  has a past surgical history that includes herniated disc and Knee surgery (Right).  His family history is not on file.  Review of Systems  Constitutional: Negative for chills,  diaphoresis and fever.  Eyes: Negative.   Respiratory: Negative for cough, hemoptysis, sputum production, shortness of breath and wheezing.   Cardiovascular: Negative for chest pain, orthopnea and leg swelling.  Gastrointestinal: Negative for nausea.  Skin: Negative for rash.  Neurological: Negative for dizziness, sensory change, speech change, focal weakness and headaches.  Psychiatric/Behavioral: Positive for depression. Negative for hallucinations, memory loss, substance abuse and suicidal ideas. The patient is not nervous/anxious and does not have insomnia.     The problem list and medications were reviewed and updated by myself where necessary and exist elsewhere in the encounter.   OBJECTIVE:  BP 122/68 (BP Location: Left Arm, Patient Position: Sitting, Cuff Size: Normal)   Pulse 76   Temp 97.9 F (36.6 C) (Oral)   Resp 18   Ht 5' 11.6" (1.819 m)   Wt 159 lb 9.6 oz (72.4 kg)   SpO2 98%   BMI 21.89 kg/m   Physical Exam  Constitutional: He appears well-developed. He is active and cooperative.  Non-toxic appearance.  Cardiovascular: Normal rate and regular rhythm.   Pulmonary/Chest: Effort normal and breath sounds normal. No tachypnea.  Musculoskeletal: Normal range of motion.  Neurological: He is alert.  Skin: Skin is warm and dry. He is not diaphoretic. No pallor.  Vitals reviewed.      Lab Results  Component Value Date   WBC 7.1 07/13/2017  HGB 12.5 (L) 07/13/2017   HCT 38.7 07/13/2017   MCV 95 07/13/2017   PLT 199 07/13/2017    Lab Results  Component Value Date   CREATININE 1.00 07/13/2017   BUN 12 07/13/2017   NA 142 07/13/2017   K 4.0 07/13/2017   CL 101 07/13/2017   CO2 23 07/13/2017    Lab Results  Component Value Date   ALT 11 07/13/2017   AST 17 07/13/2017   ALKPHOS 100 07/13/2017   BILITOT 0.6 07/13/2017    Lab Results  Component Value Date   TSH 3.610 07/13/2017    Lab Results  Component Value Date   CHOL 215 (H) 07/13/2017    HDL 57 07/13/2017   LDLCALC 145 (H) 07/13/2017   TRIG 66 07/13/2017   CHOLHDL 3.8 07/13/2017       No results found for this or any previous visit (from the past 72 hour(s)).  No results found.  ASSESSMENT AND PLAN:  Brad [name] was seen today for follow-up.  Diagnoses and all orders for this visit:  Syncope, unspecified syncope type: Brad [name] tells me that has been going on for quite a while. I feel certain that Brad [name] has some dementia and this is evident on his [place].  Will get a CT scan to rule out a structural lesion that might be causing his syncope, however it sounds more like he is simply falling asleep. He has a history of alcoholism.  I need to check a B12, folate and an RPR.    -     CT Head Wo Contrast; Future  Dementia without behavioral disturbance, unspecified dementia type -     CT Head Wo Contrast; Future  Need for prophylactic vaccination and inoculation against influenza -     Flu Vaccine QUAD 36+ mos IM    The patient is advised to call or return to clinic if he does not see an improvement in symptoms, or to seek the care of the closest emergency department if he worsens with the above plan.   Philis Fendt, MHS, PA-C [facility] 10/03/2017 11:11 AM

## 2017-10-10 ENCOUNTER — Emergency Department (HOSPITAL_COMMUNITY)
Admission: EM | Admit: 2017-10-10 | Discharge: 2017-10-10 | Disposition: A | Discharge status: Home / Self Care | Payer: Medicare Other | Attending: Emergency Medicine | Admitting: Emergency Medicine

## 2017-10-10 ENCOUNTER — Emergency Department (HOSPITAL_COMMUNITY): Payer: Medicare Other

## 2017-10-10 ENCOUNTER — Encounter (HOSPITAL_COMMUNITY): Payer: Self-pay | Admitting: *Deleted

## 2017-10-10 DIAGNOSIS — Z79899 Other long term (current) drug therapy: Secondary | ICD-10-CM | POA: Diagnosis not present

## 2017-10-10 DIAGNOSIS — R4189 Other symptoms and signs involving cognitive functions and awareness: Secondary | ICD-10-CM

## 2017-10-10 DIAGNOSIS — I10 Essential (primary) hypertension: Secondary | ICD-10-CM | POA: Insufficient documentation

## 2017-10-10 DIAGNOSIS — R55 Syncope and collapse: Secondary | ICD-10-CM | POA: Diagnosis not present

## 2017-10-10 DIAGNOSIS — F17200 Nicotine dependence, unspecified, uncomplicated: Secondary | ICD-10-CM | POA: Insufficient documentation

## 2017-10-10 DIAGNOSIS — E119 Type 2 diabetes mellitus without complications: Secondary | ICD-10-CM | POA: Diagnosis not present

## 2017-10-10 DIAGNOSIS — R4020 Unspecified coma: Secondary | ICD-10-CM | POA: Diagnosis not present

## 2017-10-10 LAB — URINALYSIS, ROUTINE W REFLEX MICROSCOPIC
Bilirubin Urine: NEGATIVE
Glucose, UA: NEGATIVE mg/dL
Hgb urine dipstick: NEGATIVE
KETONES UR: NEGATIVE mg/dL
LEUKOCYTES UA: NEGATIVE
NITRITE: NEGATIVE
Protein, ur: NEGATIVE mg/dL
Specific Gravity, Urine: 1.021 (ref 1.005–1.030)
pH: 5 (ref 5.0–8.0)

## 2017-10-10 LAB — CBC
HCT: 38.3 % — ABNORMAL LOW (ref 39.0–52.0)
Hemoglobin: 12.4 g/dL — ABNORMAL LOW (ref 13.0–17.0)
MCH: 30.5 pg (ref 26.0–34.0)
MCHC: 32.4 g/dL (ref 30.0–36.0)
MCV: 94.3 fL (ref 78.0–100.0)
Platelets: 210 10*3/uL (ref 150–400)
RBC: 4.06 MIL/uL — ABNORMAL LOW (ref 4.22–5.81)
RDW: 12.8 % (ref 11.5–15.5)
WBC: 8.5 10*3/uL (ref 4.0–10.5)

## 2017-10-10 LAB — BASIC METABOLIC PANEL
Anion gap: 10 (ref 5–15)
BUN: 15 mg/dL (ref 6–20)
CHLORIDE: 104 mmol/L (ref 101–111)
CO2: 25 mmol/L (ref 22–32)
CREATININE: 1.04 mg/dL (ref 0.61–1.24)
Calcium: 9.4 mg/dL (ref 8.9–10.3)
GFR calc Af Amer: >60 mL/min (ref >60)
GFR calc non Af Amer: >60 mL/min (ref >60)
Glucose, Bld: 116 mg/dL — ABNORMAL HIGH (ref 65–99)
Potassium: 3.6 mmol/L (ref 3.5–5.1)
Sodium: 139 mmol/L (ref 135–145)

## 2017-10-10 LAB — CBG MONITORING, ED: GLUCOSE-CAPILLARY: 119 mg/dL — AB (ref 65–99)

## 2017-10-10 NOTE — ED Notes (Signed)
Patient transported to CT 

## 2017-10-10 NOTE — ED Notes (Signed)
Walked PT around POD E, no issues found however I do feel PT would benefit from a walking cain.

## 2017-10-10 NOTE — ED Notes (Signed)
Provided PT with Ginger Ale

## 2017-10-10 NOTE — ED Provider Notes (Signed)
[facility] EMERGENCY DEPARTMENT Provider Note   CSN: [id number] Arrival date & time: 10/10/17  0355     History   Chief Complaint Chief Complaint  Patient presents with  . Loss of Consciousness    possible seizure    HPI Brad [name] is a 71 y.o. male.  Patient with episode of unresponsiveness yesterday, while sitting at home.  Family was with him, walked away for a couple minutes, and when when returned pt was unresponsive.  Pt did not lose postural tone or fall. No generalized tonic clonic seizure activity was noted. They indicate pt remained poorly responsive/confused for 3-4 minutes but was able to go into house w assistance. Pt is very limited historian, does not recall events.  Family notes a few similar episodes over the course of the past 3-6 months.  Pt indicates today feels fine.  Denies headaches. No chest pain or sob. No cough or uri c/o. No abd pain. No vomiting or diarrhea. No gu c/o. No recent wt loss. No trauma or fall. No numbness/weakness or abrupt change in functional ability. No incontinence.    The history is provided by the patient and a relative. The history is limited by the condition of the patient.  Loss of Consciousness   Pertinent negatives include abdominal pain, back pain, chest pain, confusion, fever, headaches and weakness.    Past Medical History:  Diagnosis Date  . Anemia   . Arthritis   . Cancer [facility]) 2017   prostate  . Depression   . Diabetes mellitus without complication ([place])   . History of stomach ulcers   . Hypertension   . Seizures ([place])   . Substance abuse [facility])     Patient Active Problem List   Diagnosis Date Noted  . THYROID STIMULATING HORMONE, ABNORMAL 06/14/2009  . KNEE PAIN, RIGHT 06/11/2009  . NECK PAIN 06/11/2009  . MEMORY LOSS 06/11/2009  . ANEMIA, IRON DEFICIENCY 06/23/2008  . ABDOMINAL PAIN, EPIGASTRIC 05/29/2008  . HYPERTENSION 02/13/2008  . DENTAL CARIES 02/11/2008  . RHEUMATOID  ARTHRITIS, SERONEGATIVE 02/11/2008    Past Surgical History:  Procedure Laterality Date  . herniated disc    . KNEE SURGERY Right        Home Medications    Prior to Admission medications   Medication Sig Start Date End Date Taking? Authorizing Provider  amLODipine (NORVASC) 2.5 MG tablet Take 1 tablet (2.5 mg total) by mouth daily. 07/13/17   Tereasa Coop, PA-C    Family History No family history on file.  Social History Social History  Substance Use Topics  . Smoking status: Light Tobacco Smoker  . Smokeless tobacco: Never Used     Comment: occasional  . Alcohol use 1.2 - 1.8 oz/week    2 - 3 Cans of beer per week     Comment: occasional     Allergies   Patient has no known allergies.   Review of Systems Review of Systems  Constitutional: Negative for fever.  HENT: Negative for sore throat.   Eyes: Negative for visual disturbance.  Respiratory: Negative for shortness of breath.   Cardiovascular: Positive for syncope. Negative for chest pain.  Gastrointestinal: Negative for abdominal pain.  Genitourinary: Negative for flank pain.  Musculoskeletal: Negative for back pain and neck pain.  Skin: Negative for rash.  Neurological: Negative for weakness, numbness and headaches.  Hematological: Does not bruise/bleed easily.  Psychiatric/Behavioral: Negative for confusion.     Physical Exam Updated Vital Signs BP Marland Kitchen)  158/50   Pulse 71   Temp 98 F (36.7 C) (Oral)   Resp 16   Ht 1.803 m (5\' 11" )   Wt 72.1 kg (159 lb)   SpO2 93%   BMI 22.18 kg/m   Physical Exam  Constitutional: He appears well-developed and well-nourished. No distress.  HENT:  Head: Atraumatic.  Mouth/Throat: Oropharynx is clear and moist.  No oral/tongue injury.   Eyes: Pupils are equal, round, and reactive to light. Conjunctivae are normal.  Neck: Neck supple. No tracheal deviation present. No thyromegaly present.  No bruits.   Cardiovascular: Normal rate, regular rhythm, normal  heart sounds and intact distal pulses.  Exam reveals no gallop and no friction rub.   No murmur heard. Pulmonary/Chest: Effort normal and breath sounds normal. No accessory muscle usage. No respiratory distress.  Abdominal: Soft. Bowel sounds are normal. He exhibits no distension. There is no tenderness.  Genitourinary:  Genitourinary Comments: No cva tenderness  Musculoskeletal: He exhibits no edema.  Neurological: He is alert.  Speech quiet, but clear. Motor intact bil, stre 5/5. No pronator drift. sens grossly intact.   Skin: Skin is warm and dry. He is not diaphoretic.  Psychiatric: He has a normal mood and affect.  Nursing note and vitals reviewed.    ED Treatments / Results  Labs (all labs ordered are listed, but only abnormal results are displayed) Results for orders placed or performed during the hospital encounter of 29/56/21  Basic metabolic panel  Result Value Ref Range   Sodium 139 135 - 145 mmol/L   Potassium 3.6 3.5 - 5.1 mmol/L   Chloride 104 101 - 111 mmol/L   CO2 25 22 - 32 mmol/L   Glucose, Bld 116 (H) 65 - 99 mg/dL   BUN 15 6 - 20 mg/dL   Creatinine, Ser 1.04 0.61 - 1.24 mg/dL   Calcium 9.4 8.9 - 10.3 mg/dL   GFR calc non Af Amer >60 >60 mL/min   GFR calc Af Amer >60 >60 mL/min   Anion gap 10 5 - 15  CBC  Result Value Ref Range   WBC 8.5 4.0 - 10.5 K/uL   RBC 4.06 (L) 4.22 - 5.81 MIL/uL   Hemoglobin 12.4 (L) 13.0 - 17.0 g/dL   HCT 38.3 (L) 39.0 - 52.0 %   MCV 94.3 78.0 - 100.0 fL   MCH 30.5 26.0 - 34.0 pg   MCHC 32.4 30.0 - 36.0 g/dL   RDW 12.8 11.5 - 15.5 %   Platelets 210 150 - 400 K/uL  Urinalysis, Routine w reflex microscopic  Result Value Ref Range   Color, Urine YELLOW YELLOW   APPearance CLEAR CLEAR   Specific Gravity, Urine 1.021 1.005 - 1.030   pH 5.0 5.0 - 8.0   Glucose, UA NEGATIVE NEGATIVE mg/dL   Hgb urine dipstick NEGATIVE NEGATIVE   Bilirubin Urine NEGATIVE NEGATIVE   Ketones, ur NEGATIVE NEGATIVE mg/dL   Protein, ur NEGATIVE  NEGATIVE mg/dL   Nitrite NEGATIVE NEGATIVE   Leukocytes, UA NEGATIVE NEGATIVE  CBG monitoring, ED  Result Value Ref Range   Glucose-Capillary 119 (H) 65 - 99 mg/dL   Comment 1 Notify RN    Comment 2 Document in Chart     EKG  EKG Interpretation  Date/Time:  Wednesday October 10 2017 09:49:33 EDT Ventricular Rate:  83 PR Interval:  160 QRS Duration: 84 QT Interval:  400 QTC Calculation: 470 R Axis:   -12 Text Interpretation:  Normal sinus rhythm with sinus arrhythmia Nonspecific  T wave abnormality No previous tracing Confirmed by Lajean Saver [phone]) on 10/10/2017 11:05:14 AM       Radiology Ct Head Wo Contrast  Result Date: 10/10/2017 CLINICAL DATA:  71 year old male with multiple syncopal episodes over several months. Altered level of consciousness. EXAM: CT HEAD WITHOUT CONTRAST TECHNIQUE: Contiguous axial images were obtained from the base of the skull through the vertex without intravenous contrast. COMPARISON:  07/21/2008 head CT a FINDINGS: Brain: No evidence of acute infarction, hemorrhage, hydrocephalus, extra-axial collection or mass lesion/mass effect. Atrophy, chronic small-vessel white matter ischemic changes and remote basal ganglia infarcts again noted. Vascular: Atherosclerotic calcifications noted. Skull: Normal. Negative for fracture or focal lesion. Sinuses/Orbits: No acute finding. Other: None IMPRESSION: 1. No evidence of acute intracranial abnormality 2. Atrophy, chronic small-vessel white matter ischemic changes and remote basal ganglia infarcts. Electronically Signed   By: Margarette Canada M.D.   On: 10/10/2017 13:36    Procedures Procedures (including critical care time)  Medications Ordered in ED Medications - No data to display   Initial Impression / Assessment and Plan / ED Course  I have reviewed the triage vital signs and the nursing notes.  Pertinent labs & imaging results that were available during my care of the patient were reviewed by me and  considered in my medical decision making (see chart for details).  Iv ns. Labs. Ct.  Reviewed nursing notes and prior charts for additional history.   No seizure activity in ed.    pts mental status and functional ability remains c/w baseline.  Po fluids. Ambulates in hall.  Patient remains asymptomatic, and appears stable for d/c.  Will refer to neurology f/u.     Final Clinical Impressions(s) / ED Diagnoses   Final diagnoses:  None    New Prescriptions New Prescriptions   No medications on file     Lajean Saver, MD 10/10/17 1446

## 2017-10-10 NOTE — Discharge Instructions (Addendum)
It was our pleasure to provide your ER care today - we hope that you feel better.  Follow up with your doctor in the coming week.  Given recent symptoms, also follow up with neurology in 1 week - see referral - call office to arrange appointment.   Also, at times your heart rate is low (50's) - it is possible this is related to recent symptoms, follow up with cardiologist in 1-2 weeks, discuss possible outpatient monitor.   Return to ER if worse, new symptoms, fevers, trouble breathing, seizure, fainting, other concern.

## 2017-10-10 NOTE — ED Triage Notes (Signed)
Pt brought here by sister for multiple syncopal episodes x several months.  States in last 3 months pt would be sitting and just lose consciousness with eyes open x 7 min, and then when he came to he would be altered for quite some time.  Yesterday he states his legs gave out and he fell.  Bystanders state he was "spaced out".

## 2017-10-11 ENCOUNTER — Encounter: Payer: Self-pay | Admitting: Neurology

## 2017-10-11 ENCOUNTER — Ambulatory Visit (INDEPENDENT_AMBULATORY_CARE_PROVIDER_SITE_OTHER): Payer: Medicare Other | Admitting: Neurology

## 2017-10-11 ENCOUNTER — Telehealth: Payer: Self-pay | Admitting: *Deleted

## 2017-10-11 VITALS — BP 139/79 | HR 67 | Ht 74.0 in | Wt 159.0 lb

## 2017-10-11 DIAGNOSIS — G40909 Epilepsy, unspecified, not intractable, without status epilepticus: Secondary | ICD-10-CM

## 2017-10-11 MED ORDER — DIVALPROEX SODIUM 500 MG PO DR TAB: 500.0000 mg | DELAYED_RELEASE_TABLET | Freq: Every day | ORAL | 3 refills | Status: DC

## 2017-10-11 NOTE — Progress Notes (Signed)
Thanks for seeing him.  He has a base line dementia and last MOCA was about 22 if I remember correctly. His sister wants him treated for depression.

## 2017-10-11 NOTE — Patient Instructions (Signed)
Valproic Acid, Divalproex Sodium delayed or extended-release tablets What is this medicine? DIVALPROEX SODIUM (dye VAL pro ex SO dee um) is used to prevent seizures caused by some forms of epilepsy. It is also used to treat bipolar mania and to prevent migraine headaches. This medicine may be used for other purposes; ask your health care provider or pharmacist if you have questions. COMMON BRAND NAME(S): Depakote, Depakote ER What should I tell my health care provider before I take this medicine? They need to know if you have any of these conditions: -if you often drink alcohol -kidney disease -liver disease -low platelet counts -mitochondrial disease -suicidal thoughts, plans, or attempt; a previous suicide attempt by you or a family member -urea cycle disorder (UCD) -an unusual or allergic reaction to divalproex sodium, sodium valproate, valproic acid, other medicines, foods, dyes, or preservatives -pregnant or trying to get pregnant -breast-feeding How should I use this medicine? Take this medicine by mouth with a drink of water. Follow the directions on the prescription label. Do not cut, crush or chew this medicine. You can take it with or without food. If it upsets your stomach, take it with food. Take your medicine at regular intervals. Do not take it more often than directed. Do not stop taking except on your doctor's advice. A special MedGuide will be given to you by the pharmacist with each prescription and refill. Be sure to read this information carefully each time. Talk to your pediatrician regarding the use of this medicine in children. While this drug may be prescribed for children as young as 10 years for selected conditions, precautions do apply. Overdosage: If you think you have taken too much of this medicine contact a poison control center or emergency room at once. NOTE: This medicine is only for you. Do not share this medicine with others. What if I miss a dose? If you  miss a dose, take it as soon as you can. If it is almost time for your next dose, take only that dose. Do not take double or extra doses. What may interact with this medicine? Do not take this medicine with any of the following medications: -sodium phenylbutyrate This medicine may also interact with the following medications: -aspirin -certain antibiotics like ertapenem, imipenem, meropenem -certain medicines for depression, anxiety, or psychotic disturbances -certain medicines for seizures like carbamazepine, clonazepam, diazepam, ethosuximide, felbamate, lamotrigine, phenobarbital, phenytoin, primidone, rufinamide, topiramate -certain medicines that treat or prevent blood clots like warfarin -cholestyramine -male hormones, like estrogens and birth control pills, patches, or rings -propofol -rifampin -ritonavir -tolbutamide -zidovudine This list may not describe all possible interactions. Give your health care provider a list of all the medicines, herbs, non-prescription drugs, or dietary supplements you use. Also tell them if you smoke, drink alcohol, or use illegal drugs. Some items may interact with your medicine. What should I watch for while using this medicine? Tell your doctor or healthcare professional if your symptoms do not get better or they start to get worse. Wear a medical ID bracelet or chain, and carry a card that describes your disease and details of your medicine and dosage times. You may get drowsy, dizzy, or have blurred vision. Do not drive, use machinery, or do anything that needs mental alertness until you know how this medicine affects you. To reduce dizzy or fainting spells, do not sit or stand up quickly, especially if you are an older patient. Alcohol can increase drowsiness and dizziness. Avoid alcoholic drinks. This medicine can make you   more sensitive to the sun. Keep out of the sun. If you cannot avoid being in the sun, wear protective clothing and use  sunscreen. Do not use sun lamps or tanning beds/booths. Patients and their families should watch out for new or worsening depression or thoughts of suicide. Also watch out for sudden changes in feelings such as feeling anxious, agitated, panicky, irritable, hostile, aggressive, impulsive, severely restless, overly excited and hyperactive, or not being able to sleep. If this happens, especially at the beginning of treatment or after a change in dose, call your health care professional. Women should inform their doctor if they wish to become pregnant or think they might be pregnant. There is a potential for serious side effects to an unborn child. Talk to your health care professional or pharmacist for more information. Women who become pregnant while using this medicine may enroll in the [place] Antiepileptic Drug Pregnancy Registry by calling 1-888-233-2334. This registry collects information about the safety of antiepileptic drug use during pregnancy. What side effects may I notice from receiving this medicine? Side effects that you should report to your doctor or health care professional as soon as possible: -allergic reactions like skin rash, itching or hives, swelling of the face, lips, or tongue -changes in vision -redness, blistering, peeling or loosening of the skin, including inside the mouth -signs and symptoms of liver injury like dark yellow or brown urine; general ill feeling or flu-like symptoms; light-colored stools; loss of appetite; nausea; right upper belly pain; unusually weak or tired; yellowing of the eyes or skin -suicidal thoughts or other mood changes -unusual bleeding or bruising Side effects that usually do not require medical attention (report to your doctor or health care professional if they continue or are bothersome): -constipation -diarrhea -dizziness -hair loss -headache -loss of appetite -weight gain This list may not describe all possible side effects. Call  your doctor for medical advice about side effects. You may report side effects to FDA at 1-800-FDA-1088. Where should I keep my medicine? Keep out of reach of children. Store at room temperature between 15 and 30 degrees C (59 and 86 degrees F). Keep container tightly closed. Throw away any unused medicine after the expiration date. NOTE: This sheet is a summary. It may not cover all possible information. If you have questions about this medicine, talk to your doctor, pharmacist, or health care provider.  2018 Elsevier/Gold Standard (2016-03-09 07:11:40)  

## 2017-10-11 NOTE — Telephone Encounter (Addendum)
Needs appt 6-8wks with mm/np, call sister, [name].  Pt saw Dr. Jaynee Eagles today.

## 2017-10-11 NOTE — Progress Notes (Signed)
[facility]    Provider:  [name] Referring Provider: Tereasa Coop, PA-C Primary Care Physician:  Tereasa Coop, PA-C  CC:  Syncope and seizures  HPI:  Brad [name] is a 71 y.o. male here as a referral from Dr. Carlis Abbott for syncope and seizures. Past medical history of substance abuse, seizures, hypertension, diabetes, depression, arthritis, anemia, dementia. He is with his sister and brother in law. Patient says he is sitting around, he feels tired, he "blanks out". He denies remembering while he is blanked out. At least 3 times since July or august. Lasts a few minutes. He loses consciousness and falls. He becomes incoherant, "eyes open but not there" staring into space per brother in law. They help him off the ground and he comes to. Per sister he had "fallen out" in the past and one time he was taken to the local hospital for observation but unclear what the outcome was. She doesn't know, but has seen these staring, incoherant episodes. Last one was Tuesday. Previously 3-4 weeks ago. Then a month beforehand. The first one he was standing on the sidewalk and he just "started going down". No FHx of seizures. Previous drug and alcohol use not anymore. But not drug or alcoholr elated. No other focal neurologic deficits, associated symptoms, inciting events or modifiable factors.  Reviewed notes, labs and imaging from outside physicians, which showed:  Personally reviewed CT of the head images 10/10/2017 and agree with the following:  1. No evidence of acute intracranial abnormality 2. Atrophy, chronic small-vessel white matter ischemic changes and remote basal ganglia infarcts.  TSH July 2018 was normal 3.61 CBC 10/10/2017 showed anemia hemoglobin 12.4, BMP unremarkable.   Review of Systems: Patient complains of symptoms per HPI as well as the following symptoms memory loss, confusion, numbness, dizziness, seizure, passing out, depression, too much sleep,  disinterest in activities. Pertinent negatives and positives per HPI. All others negative.   Social History   Social History  . Marital status: Single    Spouse name: N/A  . Number of children: N/A  . Years of education: N/A   Occupational History  . Not on file.   Social History Main Topics  . Smoking status: Light Tobacco Smoker  . Smokeless tobacco: Never Used     Comment: occasional  . Alcohol use 1.2 - 1.8 oz/week    2 - 3 Cans of beer per week     Comment: occasional  . Drug use: Yes    Types: Marijuana     Comment: occasional  . Sexual activity: Not on file   Other Topics Concern  . Not on file   Social History Narrative  . No narrative on file    Family History  Problem Relation Age of Onset  . Seizures Neg Hx     Past Medical History:  Diagnosis Date  . Anemia   . Arthritis   . Cancer [facility] [place]) 2017   prostate  . Depression   . Diabetes mellitus without complication ([place])   . History of stomach ulcers   . Hypertension   . Seizures ([place])   . Substance abuse [facility])     Past Surgical History:  Procedure Laterality Date  . herniated disc    . KNEE SURGERY Right     Current Outpatient Prescriptions  Medication Sig Dispense Refill  . acetaminophen (TYLENOL) 325 MG tablet Take 650 mg by mouth every 6 (six) hours as needed for mild pain.    Marland Kitchen  amLODipine (NORVASC) 2.5 MG tablet Take 1 tablet (2.5 mg total) by mouth daily. 90 tablet 0  . lovastatin (MEVACOR) 10 MG tablet Take 10 mg by mouth at bedtime.    . divalproex (DEPAKOTE) 500 MG DR tablet Take 1 tablet (500 mg total) by mouth daily. 30 tablet 3   No current facility-administered medications for this visit.     Allergies as of 10/11/2017  . (No Known Allergies)    Vitals: BP 139/79   Pulse 67   Ht 6\' 2"  (1.88 m)   Wt 159 lb (72.1 kg)   BMI 20.41 kg/m  Last Weight:  Wt Readings from Last 1 Encounters:  10/11/17 159 lb (72.1 kg)   Last Height:   Ht Readings from Last 1 Encounters:    10/11/17 6\' 2"  (1.88 m)   Physical exam: Exam: Gen: NAD, , thin, poorly groomed                     CV: RRR, no MRG. No Carotid Bruits. No peripheral edema, warm, nontender Eyes: Conjunctivae clear without exudates or hemorrhage  Neuro: Detailed Neurologic Exam  Speech:    Speech is dysarthric due to adentulous Cognition:   .mmse    The patient is oriented to person    recent and remote memory impaired;     language fluent;     Impaired attention, concentration,  fund of knowledge Cranial Nerves:    The pupils are small and minimally reactive. Attempted fundoscopic exam could not visualize. . Visual fields are full to finger confrontation. Extraocular movements are intact saccades, impaired smooth pursuit. Trigeminal sensation is intact and the muscles of mastication are normal. The face is symmetric. The palate elevates in the midline. Hearing intact. Voice is normal. Shoulder shrug is normal. The tongue has normal motion without fasciculations.   Coordination:    Normal finger to nose    Gait:    Difficulty getting out of seat, uses a cane, short strides, mildly stooped  Motor Observation:    no involuntary movements noted. Tone:    Normal muscle tone.     Strength: right LE proximal weakness otherwise strength is V/V in the upper and lower limbs.      Sensation: intact to LT     Reflex Exam:  DTR's:    Deep tendon reflexes in the upper and lower extremities are symetricalbilaterally.   Toes:    The toes are withdrawal bilaterally.   Clonus:    Clonus is absent.       Assessment/Plan:  71 year old male here as a referral for syncope, family and patient state it is more that he "blamks out", loses consciousness, falls, becomes incoherent, eyes are open, no family history of seizures, there is a previous history of drug and alcohol use remotely but not coincident with symptoms.  MRI brain with and without contrast seizure protocol eeg routine and then possibly  an extended EEG Start Depakote, returned fairly soon to discuss results, discussed side effects of Depakote, will check labs on follow-up visit  Discussed possible sequela of untreated epilepsy, significant morbidity and mortality, discussed seizure precautions.  Patient is unable to drive, operate heavy machinery, perform activities at heights or participate in water activities until 6 months seizure free  Discussed Patients with epilepsy have a small risk of sudden unexpected death, a condition referred to as sudden unexpected death in epilepsy (SUDEP). SUDEP is defined specifically as the sudden, unexpected, witnessed or unwitnessed, nontraumatic and nondrowning death  in patients with epilepsy with or without evidence for a seizure, and excluding documented status epilepticus, in which post mortem examination does not reveal a structural or toxicologic cause for death    Orders Placed This Encounter  Procedures  . MR BRAIN W WO CONTRAST  . EEG      Sarina Ill, MD  [facility] [address] [place], [place] [address]  Phone [phone] Fax [phone]

## 2017-10-15 ENCOUNTER — Telehealth: Payer: Self-pay | Admitting: Physician Assistant

## 2017-10-15 ENCOUNTER — Encounter: Payer: Self-pay | Admitting: Neurology

## 2017-10-15 NOTE — Telephone Encounter (Signed)
Patient needs his amlodipine and meloxicam called into  [facility] on [address]. States he is out completely.

## 2017-10-16 NOTE — Telephone Encounter (Signed)
LMVM (ok per DPR) at home # Brad [name], sister that appt made for pt with CM/NP for 11/28/17 at 1045 be here 1015 for check in.  (6-8wk f/u for sz).

## 2017-10-17 ENCOUNTER — Other Ambulatory Visit: Payer: Self-pay | Admitting: Physician Assistant

## 2017-10-17 ENCOUNTER — Other Ambulatory Visit: Payer: Self-pay

## 2017-10-17 DIAGNOSIS — I10 Essential (primary) hypertension: Secondary | ICD-10-CM

## 2017-10-17 MED ORDER — AMLODIPINE BESYLATE 2.5 MG PO TABS: 2.5000 mg | ORAL_TABLET | Freq: Every day | ORAL | 0 refills | Status: DC

## 2017-10-17 NOTE — Progress Notes (Unsigned)
Amlodipine refilled.   No refill on Meloxicam.  Taken off his list at last visit.

## 2017-10-18 ENCOUNTER — Encounter: Payer: Self-pay | Admitting: *Deleted

## 2017-10-18 NOTE — Telephone Encounter (Signed)
Sent letter re: appointment.

## 2017-10-20 ENCOUNTER — Other Ambulatory Visit: Payer: Self-pay | Admitting: Physician Assistant

## 2017-10-20 MED ORDER — MELOXICAM 15 MG PO TABS: 7.5000 mg | ORAL_TABLET | Freq: Every day | ORAL | 1 refills | Status: DC | PRN

## 2017-10-20 NOTE — Progress Notes (Unsigned)
CBC Latest Ref Rng & Units 10/10/2017 07/13/2017 06/11/2009  WBC 4.0 - 10.5 K/uL 8.5 7.1 5.5  Hemoglobin 13.0 - 17.0 g/dL 12.4(L) 12.5(L) 11.7(L)  Hematocrit 39.0 - 52.0 % 38.3(L) 38.7 37.3(L)  Platelets 150 - 400 K/uL 210 199 238

## 2017-10-24 ENCOUNTER — Ambulatory Visit
Admission: RE | Admit: 2017-10-24 | Discharge: 2017-10-24 | Disposition: A | Discharge status: Home / Self Care | Payer: Medicare Other | Source: Ambulatory Visit | Attending: Neurology | Admitting: Neurology

## 2017-10-24 ENCOUNTER — Telehealth: Payer: Self-pay

## 2017-10-24 DIAGNOSIS — G40909 Epilepsy, unspecified, not intractable, without status epilepticus: Secondary | ICD-10-CM

## 2017-10-24 MED ORDER — GADOBENATE DIMEGLUMINE 529 MG/ML IV SOLN
14.0000 mL | Freq: Once | INTRAVENOUS | Status: AC | PRN
  Administered 2017-10-24: 14 mL via INTRAVENOUS

## 2017-10-24 NOTE — Telephone Encounter (Signed)
Called pt to schedule Medicare Annual Wellness Visit with Nurse Health Advisor anytime for any available appt with [facility].   Josepha Pigg, B.A.  Care Guide - [facility] at [place]

## 2017-10-29 ENCOUNTER — Ambulatory Visit (INDEPENDENT_AMBULATORY_CARE_PROVIDER_SITE_OTHER): Payer: Medicare Other | Admitting: Neurology

## 2017-10-29 ENCOUNTER — Telehealth: Payer: Self-pay | Admitting: Neurology

## 2017-10-29 DIAGNOSIS — I639 Cerebral infarction, unspecified: Secondary | ICD-10-CM

## 2017-10-29 DIAGNOSIS — I480 Paroxysmal atrial fibrillation: Secondary | ICD-10-CM

## 2017-10-29 DIAGNOSIS — G40909 Epilepsy, unspecified, not intractable, without status epilepticus: Secondary | ICD-10-CM | POA: Diagnosis not present

## 2017-10-29 DIAGNOSIS — I679 Cerebrovascular disease, unspecified: Secondary | ICD-10-CM

## 2017-10-29 NOTE — Telephone Encounter (Signed)
EEG normal, but given history seizures still a possibility continue Depakot  Discussed with his sister, MRI shows multiple strokes, one subacute and multiple chronic likely due to small vessel disease as he has many vascular risk factors. Need to complete stroke workup.  He is seeing Philis Fendt in a few days, they deny he is taking Lovastatin even though that is on his med list. Will defer to Philis Fendt to start a cholesterol medication and hopefully order a hgba1c since they are seeing him very soon. Will cc Phil[facility] Fendt on this.  He is not on ASA, no known contraindications per sister. He should take ASA 325mg  daily for stroke prevention, review with Philis Fendt for any contraindications  I will order CTA of the head and neck and echo with bubble.    IMPRESSION: 1. No acute intracranial abnormality. 2. Chronic small vessel ischemic disease with lacunar infarcts as above. Suspected small late subacute infarct in the right corona radiata. 3. Chronic ventriculomegaly favored to reflect cerebral atrophy rather than hydrocephalus.  EEG normal  Orders Placed This Encounter  Procedures  . CT ANGIO HEAD W OR WO CONTRAST  . CT ANGIO NECK W OR WO CONTRAST  . ECHOCARDIOGRAM COMPLETE BUBBLE STUDY

## 2017-10-29 NOTE — Telephone Encounter (Signed)
EEG normal, but given history seizures still a possibility continue Depakot  Discussed with his sister, MRI shows multiple strokes, one subacute and multiple chronic likely due to small vessel disease as he has many vascular risk factors. Need to complete stroke workup.  He is seeing Philis Fendt in a few days, they deny he is taking Lovastatin even though that is on his med list. Will defer to Philis Fendt to start a cholesterol medication and hopefully order a hgba1c since they are seeing him very soon. Will cc Philis Fendt on this.  He is not on ASA, no known contraindications per sister. He should take ASA 325mg  daily for stroke prevention.   I will order CTA of the head and neck and echo with bubble.      IMPRESSION: 1. No acute intracranial abnormality. 2. Chronic small vessel ischemic disease with lacunar infarcts as above. Suspected small late subacute infarct in the right corona radiata. 3. Chronic ventriculomegaly favored to reflect cerebral atrophy rather than hydrocephalus.  EEG normal

## 2017-10-29 NOTE — Procedures (Signed)
    History:  Brad [name] is a 71 year old gentleman with a history of substance abuse, seizures, and diabetes.  The patient is having episodes where he will "blank out" and have a lapse for memory during several minutes.  The patient is being evaluated for these events.  This is a routine EEG.  No skull defects are noted.  Medications include Tylenol, Norvasc, Mevacor, and Depakote.  EEG classification: Normal awake  Description of the recording: The background rhythms of this recording consists of a fairly well modulated medium amplitude alpha rhythm of 8 Hz that is reactive to eye opening and closure. As the record progresses, the patient appears to remain in the waking state throughout the recording. Photic stimulation was performed, resulting in a bilateral and symmetric photic driving response. Hyperventilation was also performed, resulting in a minimal buildup of the background rhythm activities without significant slowing seen. At no time during the recording does there appear to be evidence of spike or spike wave discharges or evidence of focal slowing. EKG monitor shows no evidence of cardiac rhythm abnormalities with a heart rate of 56.  Impression: This is a normal EEG recording in the waking state. No evidence of ictal or interictal discharges are seen.

## 2017-10-30 NOTE — Telephone Encounter (Signed)
FYI. I spoke to scheduling and they are going to get him in with you.

## 2017-11-01 ENCOUNTER — Ambulatory Visit (INDEPENDENT_AMBULATORY_CARE_PROVIDER_SITE_OTHER): Payer: Medicare Other

## 2017-11-01 ENCOUNTER — Ambulatory Visit (INDEPENDENT_AMBULATORY_CARE_PROVIDER_SITE_OTHER): Payer: Medicare Other | Admitting: Physician Assistant

## 2017-11-01 VITALS — BP 132/80 | HR 92 | Ht 74.0 in | Wt 161.1 lb

## 2017-11-01 VITALS — BP 138/84

## 2017-11-01 DIAGNOSIS — Z23 Encounter for immunization: Secondary | ICD-10-CM | POA: Diagnosis not present

## 2017-11-01 DIAGNOSIS — R4189 Other symptoms and signs involving cognitive functions and awareness: Secondary | ICD-10-CM | POA: Diagnosis not present

## 2017-11-01 DIAGNOSIS — Z87898 Personal history of other specified conditions: Secondary | ICD-10-CM | POA: Diagnosis not present

## 2017-11-01 DIAGNOSIS — Z0000 Encounter for general adult medical examination without abnormal findings: Secondary | ICD-10-CM

## 2017-11-01 NOTE — Progress Notes (Signed)
Subjective:   Brad [name] is a 71 y.o. male who presents for an Initial Medicare Annual Wellness Visit.  Review of Systems  N/A Cardiac Risk Factors include: advanced age (>44men, >55 women);hypertension;smoking/ tobacco exposure;sedentary lifestyle;male gender    Objective:    Today's Vitals   11/01/17 1127 11/01/17 1134  BP: 132/80   Pulse: 92   SpO2: 100%   Weight: 161 lb 2 oz (73.1 kg)   Height: 6\' 2"  (1.88 m)   PainSc:  5    Body mass index is 20.69 kg/m.  Current Medications (verified) Outpatient Encounter Medications as of 11/01/2017  Medication Sig  . acetaminophen (TYLENOL) 325 MG tablet Take 650 mg by mouth every 6 (six) hours as needed for mild pain.  Marland Kitchen amLODipine (NORVASC) 2.5 MG tablet Take 1 tablet (2.5 mg total) by mouth daily.  . divalproex (DEPAKOTE) 500 MG DR tablet Take 1 tablet (500 mg total) by mouth daily.  Marland Kitchen lovastatin (MEVACOR) 10 MG tablet Take 10 mg by mouth at bedtime.  . meloxicam (MOBIC) 15 MG tablet Take 0.5-1 tablets (7.5-15 mg total) by mouth daily as needed for pain. Always take the lowest effective dose.   No facility-administered encounter medications on file as of 11/01/2017.     Allergies (verified) Patient has no known allergies.   History: Past Medical History:  Diagnosis Date  . Anemia   . Arthritis   . Cancer [facility]) 2017   prostate  . Depression   . Diabetes mellitus without complication ([place])   . History of stomach ulcers   . Hypertension   . Seizures ([place])   . Substance abuse [facility])    Past Surgical History:  Procedure Laterality Date  . herniated disc    . KNEE SURGERY Right    Family History  Problem Relation Age of Onset  . Seizures Neg Hx    Social History   Occupational History  . Occupation: retired  Tobacco Use  . Smoking status: Light Tobacco Smoker    Types: Cigarettes  . Smokeless tobacco: Never Used  . Tobacco comment: occasional- patient smokes some marijauna from time to time  Substance  and Sexual Activity  . Alcohol use: Yes    Alcohol/week: 1.2 - 1.8 oz    Types: 2 - 3 Cans of beer per week    Comment: occasional  . Drug use: Yes    Types: Marijuana    Comment: occasional  . Sexual activity: Not on file   Tobacco Counseling Ready to quit: No Counseling given: Not Answered Comment: occasional- patient smokes some marijauna from time to time   Activities of Daily Living In your present state of health, do you have any difficulty performing the following activities: 11/01/2017  Hearing? N  Vision? N  Difficulty concentrating or making decisions? Y  Comment Patient states that he has some memory issues  Walking or climbing stairs? Y  Comment Patient has arthritis  Dressing or bathing? N  Doing errands, shopping? N  Preparing Food and eating ? N  Using the Toilet? N  In the past six months, have you accidently leaked urine? Y  Comment Patient has issues with urine leakage  Do you have problems with loss of bowel control? N  Managing your Medications? N  Managing your Finances? N  Housekeeping or managing your Housekeeping? N  Some recent data might be hidden    Immunizations and Health Maintenance Immunization History  Administered Date(s) Administered  . Influenza,inj,Quad PF,6+ Mos 10/03/2017  .  Pneumococcal Conjugate-13 11/01/2017  . Pneumococcal Polysaccharide-23 06/17/2005  . Td 01/18/2006   Health Maintenance Due  Topic Date Due  . Hepatitis C Screening  1946/08/08  . COLONOSCOPY  09/28/1997    Patient Care Team: Hillis Range as PCP - General (Urgent Care)  Indicate any recent Medical Services you may have received from other than [facility] providers in the past year (date may be approximate).    Assessment:   This is a routine wellness examination for Brad [name].   Hearing/Vision screen Hearing Screening Comments: Patient had a hearing exam about 15 years ago through his employer Vision Screening Comments: Patient saw his eye doctor  about 2 months ago and received his eye glasses. He will try to follow up yearly.   Dietary issues and exercise activities discussed: Current Exercise Habits: The patient does not participate in regular exercise at present, Exercise limited by: None identified  Goals    None     Depression Screen PHQ 2/9 Scores 11/01/2017 10/03/2017 08/17/2017 07/20/2017  PHQ - 2 Score 2 1 1  0  PHQ- 9 Score 6 4 4  -    Fall Risk Fall Risk  11/01/2017 10/03/2017 08/17/2017 07/20/2017 07/13/2017  Falls in the past year? Yes Yes Yes No Yes  Number falls in past yr: 2 or more 1 1 - 1  Injury with Fall? No No No - No  Risk Factor Category  High Fall Risk - - - -  Risk for fall due to : History of fall(s);Impaired balance/gait - - - -  Follow up Falls prevention discussed - - - -    Cognitive Function:     6CIT Screen 11/01/2017  What Year? 0 points  What month? 0 points  What time? 0 points  Count back from 20 0 points  Months in reverse 0 points  Repeat phrase 4 points  Total Score 4    Screening Tests Health Maintenance  Topic Date Due  . Hepatitis C Screening  Aug 15, 1946  . COLONOSCOPY  09/28/1997  . TETANUS/TDAP  11/01/2018 (Originally 01/19/2016)  . PNA vac Low Risk Adult (2 of 2 - PPSV23) 11/01/2018  . INFLUENZA VACCINE  Completed        Plan:   I have personally reviewed and noted the following in the patient's chart:   . Medical and social history . Use of alcohol, tobacco or illicit drugs  . Current medications and supplements . Functional ability and status . Nutritional status . Physical activity . Advanced directives . List of other physicians . Hospitalizations, surgeries, and ER visits in previous 12 months . Vitals . Screenings to include cognitive, depression, and falls . Referrals and appointments  In addition, I have reviewed and discussed with patient certain preventive protocols, quality metrics, and best practice recommendations. A written personalized care plan  for preventive services as well as general preventive health recommendations were provided to patient.    Patient states that he had a colonoscopy done about 2 years ago and he will contact the GI doctor and have them send over the report so we can update our system.  1. Encounter for Medicare annual wellness exam  2. Need for vaccination with 13-polyvalent pneumococcal conjugate vaccine - PCV 13 (Prevnar)    Andrez Grime, [place]   20/94/7096

## 2017-11-01 NOTE — Progress Notes (Addendum)
PRIMARY CARE AT [place], [place] [address] [phone]  Date:  11/01/2017   Name:  Brad [name]   DOB:  01/29/1946   MRN:  [id number]  PCP:  Tereasa Coop, PA-C    History of Present Illness:  Brad [name] is a 71 y.o. male patient who presents to PCP with  Chief Complaint  Patient presents with  . Follow-up    syncope     Patient is here for recheck of symptoms, and follow up after ED visit for unresponsive episode 10/10/2017.  After obtaining a history by neurology of episodes where... They have not started the Depakote medication at this time   He is with his sister and brother in law. Patient says he is sitting around, he feels tired, he "blanks out". He denies remembering while he is blanked out. At least 3 times since July or august. Lasts a few minutes. He loses consciousness and falls. He becomes incoherent, "eyes open but not there" staring into space per brother in law. They help him off the ground and he comes to. Per sister he had "fallen out" in the past and one time he was taken to the local hospital for observation but unclear what the outcome was. She doesn't know, but has seen these staring, incoherent episodes. Last one was Tuesday. Previously 3-4 weeks ago. " He reports that he has not had another episode since this prior visit to ED.  This was diagnosed as seizures, and he was advised to start Depakote, and return to his primary care for hemoglobin a1c and lipid panel, Brad [name].  They have scheduled with me at the request of Clark, in his absence.  Patient and sister report that they have not started the Depakote as of yet.  He has not had any syncopal episodes with family present.  Denies any recent history of incontinence.   Patient reports that he is not taking Mevacor as listed on medication list.   He is taking the novasc daily.   Patient is not on any aspirins.  He reports that decades ago, he was advised not to take aspirin, and only  tylenol--though he can not note why.  Denies any blood or black stool.  He does have a history of stomach ulcers noted in history.   Denies chest pain, palpitations, numbness or tingling. Currently has a little joint pain.  He has not picked up the meloxicam as of yet.  He is not taking any aspirin.    Patient Active Problem List   Diagnosis Date Noted  . THYROID STIMULATING HORMONE, ABNORMAL 06/14/2009  . KNEE PAIN, RIGHT 06/11/2009  . NECK PAIN 06/11/2009  . MEMORY LOSS 06/11/2009  . ANEMIA, IRON DEFICIENCY 06/23/2008  . ABDOMINAL PAIN, EPIGASTRIC 05/29/2008  . HYPERTENSION 02/13/2008  . DENTAL CARIES 02/11/2008  . RHEUMATOID ARTHRITIS, SERONEGATIVE 02/11/2008    Past Medical History:  Diagnosis Date  . Anemia   . Arthritis   . Cancer [facility]) 2017   prostate  . Depression   . Diabetes mellitus without complication ([place])   . History of stomach ulcers   . Hypertension   . Seizures ([place])   . Substance abuse [facility])     Past Surgical History:  Procedure Laterality Date  . herniated disc    . KNEE SURGERY Right     Social History   Tobacco Use  . Smoking status: Light Tobacco Smoker    Types: Cigarettes  . Smokeless tobacco: Never Used  .  Tobacco comment: occasional- patient smokes some marijauna from time to time  Substance Use Topics  . Alcohol use: Yes    Alcohol/week: 1.2 - 1.8 oz    Types: 2 - 3 Cans of beer per week    Comment: occasional  . Drug use: Yes    Types: Marijuana    Comment: occasional    Family History  Problem Relation Age of Onset  . Seizures Neg Hx     No Known Allergies  Medication list has been reviewed and updated.  Current Outpatient Medications on File Prior to Visit  Medication Sig Dispense Refill  . acetaminophen (TYLENOL) 325 MG tablet Take 650 mg by mouth every 6 (six) hours as needed for mild pain.    Marland Kitchen amLODipine (NORVASC) 2.5 MG tablet Take 1 tablet (2.5 mg total) by mouth daily. 90 tablet 0  . divalproex (DEPAKOTE) 500 MG  DR tablet Take 1 tablet (500 mg total) by mouth daily. 30 tablet 3  . meloxicam (MOBIC) 15 MG tablet Take 0.5-1 tablets (7.5-15 mg total) by mouth daily as needed for pain. Always take the lowest effective dose. 60 tablet 1   No current facility-administered medications on file prior to visit.     ROS ROS otherwise unremarkable unless listed above.  Physical Examination: BP 138/84  Ideal Body Weight:    Physical Exam  Constitutional: He is oriented to person, place, and time. He appears well-developed and well-nourished. No distress.  HENT:  Head: Normocephalic and atraumatic.  Eyes: Conjunctivae and EOM are normal. Pupils are equal, round, and reactive to light.  Cardiovascular: Normal rate.  Pulmonary/Chest: Effort normal. No respiratory distress.  Neurological: He is alert and oriented to person, place, and time.  Skin: Skin is warm and dry. He is not diaphoretic.  Psychiatric: He has a normal mood and affect. His behavior is normal.  vitals obtained today at medicare annual wellness visit.  BP: 132/80   Pulse: 92   SpO2: 100%   Weight: 161 lb 2 oz (73.1 kg)   Height: 6\' 2"  (1.88 m)   PainSc:       Assessment and Plan: Brad [name] is a 71 y.o. male who is here today for cc of  Chief Complaint  Patient presents with  . Follow-up    syncope  I am obtaining the lipid and the a1c today.   I have advised hydration will decide on additional statin and possible diabetic medication pending labs. There is concern for the Depakote.  The patient and the family are unsure that this is the proper diagnosis.  I am concerned that if he does not take the medication consistently, threshold of episodes may lower, and increase of episodes will ensue.  They report that they are willing to  Take any medication to avoid stroke risk.  Unresponsive episode - Plan: Hemoglobin A1c, Lipid panel  History of seizure  Ivar Drape, PA-C [facility] 11/16/201810:59 AM  Addendum: contacted to start atorvastatin 20mg  daily, as well as a baby aspirin.   Advised to follow the instruction of neurologist in terms of his start of anti-epileptics, and if there is concern, please revisit this with neurologist at next visit which is within couple weeks away.

## 2017-11-01 NOTE — Patient Instructions (Signed)
     IF you received an x-ray today, you will receive an invoice from [facility]. Please contact [facility] at [phone] with questions or concerns regarding your invoice.   IF you received labwork today, you will receive an invoice from [facility]. Please contact [facility] at [phone] with questions or concerns regarding your invoice.   Our billing staff will not be able to assist you with questions regarding bills from these companies.  You will be contacted with the lab results as soon as they are available. The fastest way to get your results is to activate your My Chart account. Instructions are located on the last page of this paperwork. If you have not heard from us regarding the results in 2 weeks, please contact this office.     

## 2017-11-01 NOTE — Patient Instructions (Addendum)
Brad [name] , Thank you for taking time to come for your Medicare Wellness Visit. I appreciate your ongoing commitment to your health goals. Please review the following plan we discussed and let me know if I can assist you in the future.   Screening recommendations/referrals: Colonoscopy: You state you have this done about 2 years ago, Check with your GI doctor and have them send [place] the report Recommended yearly ophthalmology/optometry visit for glaucoma screening and checkup Recommended yearly dental visit for hygiene and checkup  Vaccinations: Influenza vaccine: up to date Pneumococcal vaccine: administered today  Tdap vaccine: declined due to insurance Shingles vaccine: Check with your pharmacy about receiving this vaccine    Advanced directives: in chart  Conditions/risks identified:Try to eat more healthier foods.   Next appointment: today @ 1:40 pm with Brad [name]   Preventive Care 70 Years and Older, Male Preventive care refers to lifestyle choices and visits with your health care provider that can promote health and wellness. What does preventive care include?  A yearly physical exam. This is also called an annual well check.  Dental exams once or twice a year.  Routine eye exams. Ask your health care provider how often you should have your eyes checked.  Personal lifestyle choices, including:  Daily care of your teeth and gums.  Regular physical activity.  Eating a healthy diet.  Avoiding tobacco and drug use.  Limiting alcohol use.  Practicing safe sex.  Taking low doses of aspirin every day.  Taking vitamin and mineral supplements as recommended by your health care provider. What happens during an annual well check? The services and screenings done by your health care provider during your annual well check will depend on your age, overall health, lifestyle risk factors, and family history of disease. Counseling  Your health care provider may ask you  questions about your:  Alcohol use.  Tobacco use.  Drug use.  Emotional well-being.  Home and relationship well-being.  Sexual activity.  Eating habits.  History of falls.  Memory and ability to understand (cognition).  Work and work Statistician. Screening  You may have the following tests or measurements:  Height, weight, and BMI.  Blood pressure.  Lipid and cholesterol levels. These may be checked every 5 years, or more frequently if you are over 35 years old.  Skin check.  Lung cancer screening. You may have this screening every year starting at age 54 if you have a 30-pack-year history of smoking and currently smoke or have quit within the past 15 years.  Fecal occult blood test (FOBT) of the stool. You may have this test every year starting at age 39.  Flexible sigmoidoscopy or colonoscopy. You may have a sigmoidoscopy every 5 years or a colonoscopy every 10 years starting at age 58.  Prostate cancer screening. Recommendations will vary depending on your family history and other risks.  Hepatitis C blood test.  Hepatitis B blood test.  Sexually transmitted disease (STD) testing.  Diabetes screening. This is done by checking your blood sugar (glucose) after you have not eaten for a while (fasting). You may have this done every 1-3 years.  Abdominal aortic aneurysm (AAA) screening. You may need this if you are a current or former smoker.  Osteoporosis. You may be screened starting at age 25 if you are at high risk. Talk with your health care provider about your test results, treatment options, and if necessary, the need for more tests. Vaccines  Your health care provider may recommend  certain vaccines, such as:  Influenza vaccine. This is recommended every year.  Tetanus, diphtheria, and acellular pertussis (Tdap, Td) vaccine. You may need a Td booster every 10 years.  Zoster vaccine. You may need this after age 52.  Pneumococcal 13-valent conjugate  (PCV13) vaccine. One dose is recommended after age 56.  Pneumococcal polysaccharide (PPSV23) vaccine. One dose is recommended after age 34. Talk to your health care provider about which screenings and vaccines you need and how often you need them. This information is not intended to replace advice given to you by your health care provider. Make sure you discuss any questions you have with your health care provider. Document Released: 12/31/2015 Document Revised: 08/23/2016 Document Reviewed: 10/05/2015 Elsevier Interactive Patient Education  2017 [place] Prevention in the Home Falls can cause injuries. They can happen to people of all ages. There are many things you can do to make your home safe and to help prevent falls. What can I do on the outside of my home?  Regularly fix the edges of walkways and driveways and fix any cracks.  Remove anything that might make you trip as you walk through a door, such as a raised step or threshold.  Trim any bushes or trees on the path to your home.  Use bright outdoor lighting.  Clear any walking paths of anything that might make someone trip, such as rocks or tools.  Regularly check to see if handrails are loose or broken. Make sure that both sides of any steps have handrails.  Any raised decks and porches should have guardrails on the edges.  Have any leaves, snow, or ice cleared regularly.  Use sand or salt on walking paths during winter.  Clean up any spills in your garage right away. This includes oil or grease spills. What can I do in the bathroom?  Use night lights.  Install grab bars by the toilet and in the tub and shower. Do not use towel bars as grab bars.  Use non-skid mats or decals in the tub or shower.  If you need to sit down in the shower, use a plastic, non-slip stool.  Keep the floor dry. Clean up any water that spills on the floor as soon as it happens.  Remove soap buildup in the tub or shower  regularly.  Attach bath mats securely with double-sided non-slip rug tape.  Do not have throw rugs and other things on the floor that can make you trip. What can I do in the bedroom?  Use night lights.  Make sure that you have a light by your bed that is easy to reach.  Do not use any sheets or blankets that are too big for your bed. They should not hang down onto the floor.  Have a firm chair that has side arms. You can use this for support while you get dressed.  Do not have throw rugs and other things on the floor that can make you trip. What can I do in the kitchen?  Clean up any spills right away.  Avoid walking on wet floors.  Keep items that you use a lot in easy-to-reach places.  If you need to reach something above you, use a strong step stool that has a grab bar.  Keep electrical cords out of the way.  Do not use floor polish or wax that makes floors slippery. If you must use wax, use non-skid floor wax.  Do not have throw rugs and other  things on the floor that can make you trip. What can I do with my stairs?  Do not leave any items on the stairs.  Make sure that there are handrails on both sides of the stairs and use them. Fix handrails that are broken or loose. Make sure that handrails are as long as the stairways.  Check any carpeting to make sure that it is firmly attached to the stairs. Fix any carpet that is loose or worn.  Avoid having throw rugs at the top or bottom of the stairs. If you do have throw rugs, attach them to the floor with carpet tape.  Make sure that you have a light switch at the top of the stairs and the bottom of the stairs. If you do not have them, ask someone to add them for you. What else can I do to help prevent falls?  Wear shoes that:  Do not have high heels.  Have rubber bottoms.  Are comfortable and fit you well.  Are closed at the toe. Do not wear sandals.  If you use a stepladder:  Make sure that it is fully  opened. Do not climb a closed stepladder.  Make sure that both sides of the stepladder are locked into place.  Ask someone to hold it for you, if possible.  Clearly mark and make sure that you can see:  Any grab bars or handrails.  First and last steps.  Where the edge of each step is.  Use tools that help you move around (mobility aids) if they are needed. These include:  Canes.  Walkers.  Scooters.  Crutches.  Turn on the lights when you go into a dark area. Replace any light bulbs as soon as they burn out.  Set up your furniture so you have a clear path. Avoid moving your furniture around.  If any of your floors are uneven, fix them.  If there are any pets around you, be aware of where they are.  Review your medicines with your doctor. Some medicines can make you feel dizzy. This can increase your chance of falling. Ask your doctor what other things that you can do to help prevent falls. This information is not intended to replace advice given to you by your health care provider. Make sure you discuss any questions you have with your health care provider. Document Released: 09/30/2009 Document Revised: 05/11/2016 Document Reviewed: 01/08/2015 Elsevier Interactive Patient Education  2017 [employer].

## 2017-11-02 ENCOUNTER — Encounter: Payer: Self-pay | Admitting: Physician Assistant

## 2017-11-02 LAB — HEMOGLOBIN A1C
ESTIMATED AVERAGE GLUCOSE: 80 mg/dL
Hgb A1c MFr Bld: 4.4 % — ABNORMAL LOW (ref 4.8–5.6)

## 2017-11-02 LAB — LIPID PANEL
CHOL/HDL RATIO: 5 ratio (ref 0.0–5.0)
CHOLESTEROL TOTAL: 211 mg/dL — AB (ref 100–199)
HDL: 42 mg/dL (ref >39)
LDL CALC: 142 mg/dL — AB (ref 0–99)
Triglycerides: 137 mg/dL (ref 0–149)
VLDL CHOLESTEROL CAL: 27 mg/dL (ref 5–40)

## 2017-11-17 ENCOUNTER — Other Ambulatory Visit: Payer: Self-pay | Admitting: Physician Assistant

## 2017-11-17 ENCOUNTER — Telehealth: Payer: Self-pay | Admitting: Physician Assistant

## 2017-11-17 DIAGNOSIS — E7800 Pure hypercholesterolemia, unspecified: Secondary | ICD-10-CM

## 2017-11-17 MED ORDER — ATORVASTATIN CALCIUM 20 MG PO TABS: 20.0000 mg | ORAL_TABLET | Freq: Every day | ORAL | 1 refills | Status: DC

## 2017-11-17 NOTE — Telephone Encounter (Signed)
mychart message

## 2017-11-23 ENCOUNTER — Ambulatory Visit
Admission: RE | Admit: 2017-11-23 | Discharge: 2017-11-23 | Disposition: A | Discharge status: Home / Self Care | Payer: Medicare Other | Source: Ambulatory Visit | Attending: Neurology | Admitting: Neurology

## 2017-11-23 DIAGNOSIS — I679 Cerebrovascular disease, unspecified: Secondary | ICD-10-CM

## 2017-11-23 DIAGNOSIS — I639 Cerebral infarction, unspecified: Secondary | ICD-10-CM

## 2017-11-23 MED ORDER — IOPAMIDOL (ISOVUE-370) INJECTION 76%
75.0000 mL | Freq: Once | INTRAVENOUS | Status: AC | PRN
  Administered 2017-11-23: 75 mL via INTRAVENOUS

## 2017-11-28 ENCOUNTER — Ambulatory Visit: Payer: Self-pay | Admitting: Nurse Practitioner

## 2017-11-28 NOTE — Progress Notes (Deleted)
[facility]  PATIENT: Brad [name] DOB: 28-Oct-1946   REASON FOR VISIT: *** HISTORY FROM:    HISTORY OF PRESENT ILLNESS:  10/11/17 Brad [name] is a 71 y.o. male here as a referral from Dr. Carlis Abbott for syncope and seizures. Past medical history of substance abuse, seizures, hypertension, diabetes, depression, arthritis, anemia, dementia. He is with his sister and brother in law. Patient says he is sitting around, he feels tired, he "blanks out". He denies remembering while he is blanked out. At least 3 times since July or august. Lasts a few minutes. He loses consciousness and falls. He becomes incoherant, "eyes open but not there" staring into space per brother in law. They help him off the ground and he comes to. Per sister he had "fallen out" in the past and one time he was taken to the local hospital for observation but unclear what the outcome was. She doesn't know, but has seen these staring, incoherant episodes. Last one was Tuesday. Previously 3-4 weeks ago. Then a month beforehand. The first one he was standing on the sidewalk and he just "started going down". No FHx of seizures. Previous drug and alcohol use not anymore. But not drug or alcoholr elated. No other focal neurologic deficits, associated symptoms, inciting events or modifiable factors.   REVIEW OF SYSTEMS: Full 14 system review of systems performed and notable only for those listed, all others are neg:  Constitutional: neg  Cardiovascular: neg Ear/Nose/Throat: neg  Skin: neg Eyes: neg Respiratory: neg Gastroitestinal: neg  Hematology/Lymphatic: neg  Endocrine: neg Musculoskeletal:neg Allergy/Immunology: neg Neurological: neg Psychiatric: neg Sleep : neg   ALLERGIES: No Known Allergies  HOME MEDICATIONS: Outpatient Medications Prior to Visit  Medication Sig Dispense Refill  . acetaminophen (TYLENOL) 325 MG tablet Take 650 mg by mouth every 6 (six) hours as needed for mild pain.     Marland Kitchen amLODipine (NORVASC) 2.5 MG tablet Take 1 tablet (2.5 mg total) by mouth daily. 90 tablet 0  . atorvastatin (LIPITOR) 20 MG tablet Take 1 tablet (20 mg total) by mouth daily. 90 tablet 1  . divalproex (DEPAKOTE) 500 MG DR tablet Take 1 tablet (500 mg total) by mouth daily. 30 tablet 3  . meloxicam (MOBIC) 15 MG tablet Take 0.5-1 tablets (7.5-15 mg total) by mouth daily as needed for pain. Always take the lowest effective dose. 60 tablet 1   No facility-administered medications prior to visit.     PAST MEDICAL HISTORY: Past Medical History:  Diagnosis Date  . Anemia   . Arthritis   . Cancer [facility]) 2017   prostate  . Depression   . Diabetes mellitus without complication ([place])   . History of stomach ulcers   . Hypertension   . Seizures ([place])   . Substance abuse ([place])     PAST SURGICAL HISTORY: Past Surgical History:  Procedure Laterality Date  . herniated disc    . KNEE SURGERY Right     FAMILY HISTORY: Family History  Problem Relation Age of Onset  . Seizures Neg Hx     SOCIAL HISTORY: Social History   Socioeconomic History  . Marital status: Single    Spouse name: Not on file  . Number of children: Not on file  . Years of education: Not on file  . Highest education level: High school graduate  Social Needs  . Financial resource strain: Somewhat hard  . Food insecurity - worry: Never true  . Food insecurity - inability: Never true  .  Transportation needs - medical: No  . Transportation needs - non-medical: No  Occupational History  . Occupation: retired  Tobacco Use  . Smoking status: Light Tobacco Smoker    Types: Cigarettes  . Smokeless tobacco: Never Used  . Tobacco comment: occasional- patient smokes some marijauna from time to time  Substance and Sexual Activity  . Alcohol use: Yes    Alcohol/week: 1.2 - 1.8 oz    Types: 2 - 3 Cans of beer per week    Comment: occasional  . Drug use: Yes    Types: Marijuana    Comment: occasional  . Sexual  activity: Not on file  Other Topics Concern  . Not on file  Social History Narrative  . Not on file     PHYSICAL EXAM  There were no vitals filed for this visit. There is no height or weight on file to calculate BMI.  Generalized: Well developed, in no acute distress  Head: normocephalic and atraumatic,. Oropharynx benign  Neck: Supple, no carotid bruits  Cardiac: Regular rate rhythm, no murmur  Musculoskeletal: No deformity   Neurological examination   Mentation: Alert oriented to time, place, history taking. Attention span and concentration appropriate. Recent and remote memory intact.  Follows all commands speech and language fluent.   Cranial nerve II-XII: Fundoscopic exam reveals sharp disc margins.Pupils were equal round reactive to light extraocular movements were full, visual field were full on confrontational test. Facial sensation and strength were normal. hearing was intact to finger rubbing bilaterally. Uvula tongue midline. head turning and shoulder shrug were normal and symmetric.Tongue protrusion into cheek strength was normal. Motor: normal bulk and tone, full strength in the BUE, BLE, fine finger movements normal, no pronator drift. No focal weakness Sensory: normal and symmetric to light touch, pinprick, and  Vibration, proprioception  Coordination: finger-nose-finger, heel-to-shin bilaterally, no dysmetria Reflexes: Brachioradialis 2/2, biceps 2/2, triceps 2/2, patellar 2/2, Achilles 2/2, plantar responses were flexor bilaterally. Gait and Station: Rising up from seated position without assistance, normal stance,  moderate stride, good arm swing, smooth turning, able to perform tiptoe, and heel walking without difficulty. Tandem gait is steady  DIAGNOSTIC DATA (LABS, IMAGING, TESTING) - I reviewed patient records, labs, notes, testing and imaging myself where available.  Lab Results  Component Value Date   WBC 8.5 10/10/2017   HGB 12.4 (L) 10/10/2017   HCT 38.3  (L) 10/10/2017   MCV 94.3 10/10/2017   PLT 210 10/10/2017      Component Value Date/Time   NA 139 10/10/2017 1009   NA 142 07/13/2017 1253   K 3.6 10/10/2017 1009   CL 104 10/10/2017 1009   CO2 25 10/10/2017 1009   GLUCOSE 116 (H) 10/10/2017 1009   BUN 15 10/10/2017 1009   BUN 12 07/13/2017 1253   CREATININE 1.04 10/10/2017 1009   CALCIUM 9.4 10/10/2017 1009   PROT 7.8 07/13/2017 1253   ALBUMIN 4.5 07/13/2017 1253   AST 17 07/13/2017 1253   ALT 11 07/13/2017 1253   ALKPHOS 100 07/13/2017 1253   BILITOT 0.6 07/13/2017 1253   GFRNONAA >60 10/10/2017 1009   GFRAA >60 10/10/2017 1009   Lab Results  Component Value Date   CHOL 211 (H) 11/01/2017   HDL 42 11/01/2017   LDLCALC 142 (H) 11/01/2017   TRIG 137 11/01/2017   CHOLHDL 5.0 11/01/2017   Lab Results  Component Value Date   HGBA1C 4.4 (L) 11/01/2017   Lab Results  Component Value Date   VITAMINB12 920 (H)  06/10/2008   Lab Results  Component Value Date   TSH 3.610 07/13/2017    ***  ASSESSMENT AND PLAN  71 y.o. year old male  has a past medical history of Anemia, Arthritis, Cancer ([place]) (2017), Depression, Diabetes mellitus without complication ([place]), History of stomach ulcers, Hypertension, Seizures ([place]), and Substance abuse ([place]). here with *** 71 year old male here as a referral for syncope, family and patient state it is more that he "blamks out", loses consciousness, falls, becomes incoherent, eyes are open, no family history of seizures, there is a previous history of drug and alcohol use remotely but not coincident with symptoms.  MRI brain with and without contrast seizure protocol eeg routine and then possibly an extended EEG Start Depakote, returned fairly soon to discuss results, discussed side effects of Depakote, will check labs on follow-up visit  Discussed possible sequela of untreated epilepsy, significant morbidity and mortality, discussed seizure precautions.  Patient is unable to drive,  operate heavy machinery, perform activities at heights or participate in water activities until 6 months seizure free  Discussed Patients with epilepsy have a small risk of sudden unexpected death, a condition referred to as sudden unexpected death in epilepsy (SUDEP). SUDEP is defined specifically as the sudden, unexpected, witnessed or unwitnessed, nontraumatic and nondrowning death in patients with epilepsy with or without evidence for a seizure, and excluding documented status epilepticus, in which post mortem examination does not reveal a structural or toxicologic cause for death    Dennie Bible, [facility], [facility], [facility] [address], [place], [place] [address] [phone]

## 2017-11-29 ENCOUNTER — Telehealth: Payer: Self-pay | Admitting: Neurology

## 2017-11-29 NOTE — Telephone Encounter (Signed)
Was going to discuss at last appointment but he did not come, no showed.   - CTA of the head and neck showed that his carotid artery had some atherosclerosis on the left, about 60% so we need to repeat imaging yearly to follow. In the meantime he had to closely manage vascular risk factors such as DM, HTN, cholesterol with pcp.  He should also be on a daily aspirin prefer 325mg  as long as he has no contarindications like previous GI bleed or allergy or any other.   - however another significant problem may have been found.  it was seen that he may have a problem with his neck.  Looks like he has arthritis which may be pushing down on his spinal cord. If significant, could cause paralysis. Unfortunately CT is not as good at looking at his neck, recommend MRI of the cervical spine to better evaluate or direct referral to neurosurgery to decide what tests to do next or if NSY just ants to monitor. thanks    IMPRESSION: 1. Cervical carotid atherosclerosis with 60% left ICA stenosis. No flow limiting stenosis or definite ulceration on the right. 2. No proximal subclavian or vertebral stenosis. 3. Limited intracranial atherosclerosis without stenosis. 4. Atrophy and extensive chronic small vessel ischemia in the cerebral white matter. 5. Prominent cervical disc degeneration and ligamentum flavum thickening. Spinal stenosis with cord impingement at C4-5 and C5-6.

## 2017-12-03 NOTE — Telephone Encounter (Signed)
Called and spoke with pt's sister [name] answered the phone (on [place]). She is aware that per Dr. Jaynee Eagles, pt needs to f/u. She was very apologetic for missing the last appointment and said they will come. I r/s with NP for 12/12/17 @ 10:45 in the morning with an arrival time of 10:15. She verbalized understanding and appreciation for the call.

## 2017-12-07 ENCOUNTER — Encounter: Payer: Self-pay | Admitting: Physician Assistant

## 2017-12-11 NOTE — Progress Notes (Signed)
[facility]  PATIENT: Brad [name] DOB: 1946/01/27   REASON FOR VISIT: History of seizure disorder and lacunar infarcts on MRI of the brain, hyperlipidemia HISTORY FROM: Patient and sister   HISTORY OF PRESENT ILLNESS:UPDATE 12/26/2018CM Mr. Brad [name], 71 year old male returns for follow-up with his sister.  He was initially evaluated in October by Dr. Jaynee Eagles for seizures and syncope.  MRI of the brain on 11/7/20181. No acute intracranial abnormality. 2. Chronic small vessel ischemic disease with lacunar infarcts as above. Suspected small late subacute infarct in the right corona radiata. 3. Chronic ventriculomegaly favored to reflect cerebral atrophy rather than hydrocephalus. CTA head Anterior circulation: Mild atheromatous plaque on the carotid siphons. No branch occlusion or flow limiting stenosis, beading, or aneurysm. Posterior circulation: Codominant vertebral arteries. Dominant left PICA and right AICA. Small left AICA. Basilar and vertebral arteries are smooth and widely patent. Hypoplastic right and aplastic left P1 segments. No branch occlusion or flow limiting stenosis. Negative for aneurysm. CTA neck Cervical carotid atherosclerosis with 60% left ICA stenosis. No flow limiting stenosis or definite ulceration on the right. 2. No proximal subclavian or vertebral stenosis. 3. Limited intracranial atherosclerosis without stenosis. 4. Atrophy and extensive chronic small vessel ischemia in the cerebral white matter. 5. Prominent cervical disc degeneration and ligamentum flavum thickening. Spinal stenosis with cord impingement at C4-5 and C5-6. He is currently on aspirin for secondary stroke prevention.  He has no bruising and no bleeding cholesterol drawn by primary care on 11/01/2017 with LDL 142.  He was placed on Lipitor.  No complaints of myalgias.  Hemoglobin A1c 4.4.  Blood pressure in the office today 152/84.  He is on Depakote for seizure activity  and needs level drawn today.  EEG was normal.  No recent seizure activity per sister.  He does not exercise, pretty much sits in the chair all day and watches TV.  He has not had echocardiogram done.  He returns for reevaluation  10/11/17 Brad [name] is a 71 y.o. male here as a referral from Dr. Carlis Abbott for syncope and seizures. Past medical history of substance abuse, seizures, hypertension, diabetes, depression, arthritis, anemia, dementia. He is with his sister and brother in law. Patient says he is sitting around, he feels tired, he "blanks out". He denies remembering while he is blanked out. At least 3 times since July or august. Lasts a few minutes. He loses consciousness and falls. He becomes incoherant, "eyes open but not there" staring into space per brother in law. They help him off the ground and he comes to. Per sister he had "fallen out" in the past and one time he was taken to the local hospital for observation but unclear what the outcome was. She doesn't know, but has seen these staring, incoherant episodes. Last one was Tuesday. Previously 3-4 weeks ago. Then a month beforehand. The first one he was standing on the sidewalk and he just "started going down". No FHx of seizures. Previous drug and alcohol use not anymore. But not drug or alcoholr elated. No other focal neurologic deficits, associated symptoms, inciting events or modifiable factors.   REVIEW OF SYSTEMS: Full 14 system review of systems performed and notable only for those listed, all others are neg:  Constitutional: neg  Cardiovascular: neg Ear/Nose/Throat: neg  Skin: neg Eyes: neg Respiratory: neg Gastroitestinal: neg  Hematology/Lymphatic: neg  Endocrine: neg Musculoskeletal:neg Allergy/Immunology: neg Neurological: neg Psychiatric: neg Sleep : neg   ALLERGIES: No Known Allergies  HOME MEDICATIONS: Outpatient Medications  Prior to Visit  Medication Sig Dispense Refill  . acetaminophen (TYLENOL) 325 MG  tablet Take 650 mg by mouth every 6 (six) hours as needed for mild pain.    Marland Kitchen amLODipine (NORVASC) 2.5 MG tablet Take 1 tablet (2.5 mg total) by mouth daily. 90 tablet 0  . atorvastatin (LIPITOR) 20 MG tablet Take 1 tablet (20 mg total) by mouth daily. 90 tablet 1  . divalproex (DEPAKOTE) 500 MG DR tablet Take 1 tablet (500 mg total) by mouth daily. 30 tablet 3  . meloxicam (MOBIC) 15 MG tablet Take 0.5-1 tablets (7.5-15 mg total) by mouth daily as needed for pain. Always take the lowest effective dose. 60 tablet 1   No facility-administered medications prior to visit.     PAST MEDICAL HISTORY: Past Medical History:  Diagnosis Date  . Anemia   . Arthritis   . Cancer [facility]) 2017   prostate  . Depression   . Diabetes mellitus without complication ([place])   . History of stomach ulcers   . Hypertension   . Seizures ([place])   . Substance abuse ([place])     PAST SURGICAL HISTORY: Past Surgical History:  Procedure Laterality Date  . herniated disc    . KNEE SURGERY Right     FAMILY HISTORY: Family History  Problem Relation Age of Onset  . Seizures Neg Hx     SOCIAL HISTORY: Social History   Socioeconomic History  . Marital status: Single    Spouse name: Not on file  . Number of children: Not on file  . Years of education: Not on file  . Highest education level: High school graduate  Social Needs  . Financial resource strain: Somewhat hard  . Food insecurity - worry: Never true  . Food insecurity - inability: Never true  . Transportation needs - medical: No  . Transportation needs - non-medical: No  Occupational History  . Occupation: retired  Tobacco Use  . Smoking status: Light Tobacco Smoker    Types: Cigarettes  . Smokeless tobacco: Never Used  . Tobacco comment: occasional- patient smokes some marijauna from time to time  Substance and Sexual Activity  . Alcohol use: Yes    Alcohol/week: 1.2 - 1.8 oz    Types: 2 - 3 Cans of beer per week    Comment: occasional  .  Drug use: Yes    Types: Marijuana    Comment: occasional  . Sexual activity: Not on file  Other Topics Concern  . Not on file  Social History Narrative  . Not on file     PHYSICAL EXAM  Vitals:   12/12/17 1011  BP: (!) 152/84  Pulse: 64  Weight: 161 lb 9.6 oz (73.3 kg)  Height: 6\' 2"  (1.88 m)   Body mass index is 20.75 kg/m.  Generalized: Well developed, in no acute distress  Head: normocephalic and atraumatic,. Oropharynx benign  Neck: Supple, no carotid bruits  Cardiac: Regular rate rhythm, no murmur  Musculoskeletal: No deformity   Neurological examination   Mentation: Alert oriented to time, place, history taking. Attention span and concentration appropriate. Recent and remote memory intact.  Follows all commands speech is mildly dysarthric due to adentulous. Cranial nerve II-XII: Fundoscopic exam unable to visualize .Pupils were equal round reactive to light extraocular movements were full, visual field were full on confrontational test. Facial sensation and strength were normal. hearing was intact to finger rubbing bilaterally. Uvula tongue midline. head turning and shoulder shrug were normal and symmetric.Tongue protrusion into  cheek strength was normal. Motor: normal bulk and tone, full strength in the BUE, BLE, fine finger movements normal,  Sensory: normal and symmetric to light touch, pinprick, and  Vibration, in the upper and lower extremities Coordination: finger-nose-finger, heel-to-shin bilaterally, no dysmetria, no tremor Reflexes: Symmetric upper and lower, plantar responses were flexor bilaterally. Gait and Station: Rising up from seated position without assistance, mildly stooped posture no difficulty with turns does not use assistive device  DIAGNOSTIC DATA (LABS, IMAGING, TESTING) - I reviewed patient records, labs, notes, testing and imaging myself where available.  Lab Results  Component Value Date   WBC 8.5 10/10/2017   HGB 12.4 (L) 10/10/2017    HCT 38.3 (L) 10/10/2017   MCV 94.3 10/10/2017   PLT 210 10/10/2017      Component Value Date/Time   NA 139 10/10/2017 1009   NA 142 07/13/2017 1253   K 3.6 10/10/2017 1009   CL 104 10/10/2017 1009   CO2 25 10/10/2017 1009   GLUCOSE 116 (H) 10/10/2017 1009   BUN 15 10/10/2017 1009   BUN 12 07/13/2017 1253   CREATININE 1.04 10/10/2017 1009   CALCIUM 9.4 10/10/2017 1009   PROT 7.8 07/13/2017 1253   ALBUMIN 4.5 07/13/2017 1253   AST 17 07/13/2017 1253   ALT 11 07/13/2017 1253   ALKPHOS 100 07/13/2017 1253   BILITOT 0.6 07/13/2017 1253   GFRNONAA >60 10/10/2017 1009   GFRAA >60 10/10/2017 1009   Lab Results  Component Value Date   CHOL 211 (H) 11/01/2017   HDL 42 11/01/2017   LDLCALC 142 (H) 11/01/2017   TRIG 137 11/01/2017   CHOLHDL 5.0 11/01/2017   Lab Results  Component Value Date   HGBA1C 4.4 (L) 11/01/2017    Lab Results  Component Value Date   TSH 3.610 07/13/2017      ASSESSMENT AND PLAN 71 year old male here as a referral for syncope, family and patient state it is more that he "blamks out", loses consciousness, falls, becomes incoherent, eyes are open, no family history of seizures, there is a previous history of drug and alcohol use remotely but not coincident with symptoms.MRI of the brain on 11/7/20181. No acute intracranial abnormality. 2. Chronic small vessel ischemic disease with lacunar infarcts as above. Suspected small late subacute infarct in the right corona radiata. 3. Chronic ventriculomegaly favored to reflect cerebral atrophy rather than hydrocephalus. EEG normal CTA head Anterior circulation: Mild atheromatous plaque on the carotid siphons. No branch occlusion or flow limiting stenosis, beading, or aneurysm. Posterior circulation: Codominant vertebral arteries. Dominant left PICA and right AICA. Small left AICA. Basilar and vertebral arteries are smooth and widely patent. Hypoplastic right and aplastic left P1 segments. No branch  occlusion or flow limiting stenosis. Negative for aneurysm. CTA neck Cervical carotid atherosclerosis with 60% left ICA stenosis. No flow limiting stenosis or definite ulceration on the right. 2. No proximal subclavian or vertebral stenosis. 3. Limited intracranial atherosclerosis without stenosis. 4. Atrophy and extensive chronic small vessel ischemia in the cerebral white matter. 5. Prominent cervical disc degeneration and ligamentum flavum thickening. Spinal stenosis with cord impingement at C4-5 and C5-6.  PLAN: Stressed the importance of management of risk factors to prevent further stroke Continue aspirin for secondary stroke prevention Maintain strict control of hypertension with blood pressure goal below 130/90, today's reading 152/84 continue antihypertensive medications Control of diabetes with hemoglobin A1c below 6.5 followed by primary care most recent hemoglobin A1c4.4 Cholesterol with LDL cholesterol less than 70, followed by primary care,  most recent 142 continue statin drugs Lipitor  Exercise by walking, at least 30 min daily eat healthy diet with whole grains,  fresh fruits and vegetables lean meats  Continue Depakote for seizure activity will refill when level is  back We will check Depakote level today Reviewed recent hemoglobin A1C  and lipid panel drawn on 11/01/17 CBC and basic metabolic panel drawn on 5366/44 Follow-up in 6 months Patient has not had echo will check to see why this has not been scheduled Patient is unable to drive, operate heavy machinery, perform activities at heights or participate in water activities until 6 months seizure free I spent 30 minutes in total face to face time with the patient more than 50% of which was spent counseling and coordination of care, reviewing test results reviewing medications and discussing and reviewing the diagnosis of stroke and management of risk factors along with seizure disorder and further treatment options.  , Rayburn Ma, [facility], APRN  [facility] [address] [place], [place] [address] [phone]

## 2017-12-12 ENCOUNTER — Ambulatory Visit: Payer: Medicare Other | Admitting: Nurse Practitioner

## 2017-12-12 ENCOUNTER — Encounter: Payer: Self-pay | Admitting: Nurse Practitioner

## 2017-12-12 VITALS — BP 152/84 | HR 64 | Ht 74.0 in | Wt 161.6 lb

## 2017-12-12 DIAGNOSIS — G40909 Epilepsy, unspecified, not intractable, without status epilepticus: Secondary | ICD-10-CM | POA: Diagnosis not present

## 2017-12-12 DIAGNOSIS — I10 Essential (primary) hypertension: Secondary | ICD-10-CM

## 2017-12-12 DIAGNOSIS — Z8673 Personal history of transient ischemic attack (TIA), and cerebral infarction without residual deficits: Secondary | ICD-10-CM

## 2017-12-12 DIAGNOSIS — E785 Hyperlipidemia, unspecified: Secondary | ICD-10-CM | POA: Insufficient documentation

## 2017-12-12 DIAGNOSIS — Z5181 Encounter for therapeutic drug level monitoring: Secondary | ICD-10-CM

## 2017-12-12 NOTE — Patient Instructions (Signed)
Stressed the importance of management of risk factors to prevent further stroke Continue aspirin for secondary stroke prevention Maintain strict control of hypertension with blood pressure goal below 130/90, today's reading 152/84 continue antihypertensive medications Control of diabetes with hemoglobin A1c below 6.5 followed by primary care most recent hemoglobin A1c4.4 Cholesterol with LDL cholesterol less than 70, followed by primary care,  most recent 142 continue statin drugs Lipitor  Exercise by walking, at least 30 min daily eat healthy diet with whole grains,  fresh fruits and vegetables lean meats  Continue Depakote for seizure activity We will check Depakote level today Reviewed recent hemoglobin and lipid panel drawn on 11/01/17 CBC and basic metabolic panel drawn on 3220/25 Follow-up in 6 months

## 2017-12-13 ENCOUNTER — Telehealth: Payer: Self-pay | Admitting: *Deleted

## 2017-12-13 LAB — VALPROIC ACID LEVEL: VALPROIC ACID LVL: 68 ug/mL (ref 50–100)

## 2017-12-13 NOTE — Telephone Encounter (Signed)
Called patient's sister, Ms Colbert Coyer on [place] and advised her his lab showed a good level of Depakote, no change in his medication. She stated she gives him to him every day, verbalized understanding, appreciation of call.

## 2017-12-13 NOTE — Progress Notes (Addendum)
Brad [name], if you look at the CTA you will see the following: "Prominent cervical disc degeneration and ligamentum flavum thickening. Spinal stenosis with cord impingement at C4-5 and C5-6.".  Patient needs an MRI of his cervical spine. I am out until next week would you please call and discuss with him?   Personally  participated in, made any corrections needed, and agree with history, physical, neuro exam,assessment and plan as stated above.     Brad Ill, MD [facility]

## 2017-12-14 ENCOUNTER — Telehealth: Payer: Self-pay | Admitting: Nurse Practitioner

## 2017-12-14 DIAGNOSIS — M4802 Spinal stenosis, cervical region: Secondary | ICD-10-CM

## 2017-12-14 DIAGNOSIS — R9389 Abnormal findings on diagnostic imaging of other specified body structures: Secondary | ICD-10-CM

## 2017-12-14 NOTE — Telephone Encounter (Signed)
Brad [name] , Dr. Jaynee Eagles reviewed my last visit note and reviewed all the films again. CTA of the neck shows spinal stenosis with cord impingement she wants patient to get MRI of the cervical spine to bettter define this. Please call the pts family the pt has dementia, This was an incidental finding . Thanks.  I placed order

## 2017-12-14 NOTE — Telephone Encounter (Signed)
Called the patient's sister Ebubechukwu Jedlicka (on [place]) and informed her that Brad [name]'s CT of the neck showed spinal stenosis with cord impingement and that an MRI of his cervical spine was ordered to better define this. Her questions were answered, she verbalized understanding and will await a call for scheduling the MRI.

## 2017-12-31 ENCOUNTER — Ambulatory Visit (HOSPITAL_COMMUNITY)
Admission: RE | Admit: 2017-12-31 | Discharge: 2017-12-31 | Disposition: A | Discharge status: Home / Self Care | Payer: Medicare Other | Source: Ambulatory Visit | Attending: Neurology | Admitting: Neurology

## 2017-12-31 DIAGNOSIS — I480 Paroxysmal atrial fibrillation: Secondary | ICD-10-CM | POA: Insufficient documentation

## 2017-12-31 DIAGNOSIS — I351 Nonrheumatic aortic (valve) insufficiency: Secondary | ICD-10-CM | POA: Diagnosis not present

## 2017-12-31 DIAGNOSIS — I639 Cerebral infarction, unspecified: Secondary | ICD-10-CM | POA: Insufficient documentation

## 2017-12-31 DIAGNOSIS — I679 Cerebrovascular disease, unspecified: Secondary | ICD-10-CM | POA: Diagnosis not present

## 2017-12-31 NOTE — Progress Notes (Signed)
  Echocardiogram 2D Echocardiogram has been performed.  Brad [name] F 12/31/2017, 5:24 PM

## 2018-01-01 ENCOUNTER — Telehealth: Payer: Self-pay | Admitting: *Deleted

## 2018-01-01 NOTE — Telephone Encounter (Addendum)
Called and spoke with [name] (on [facility]). She is aware that Tamim's echo was unremarkable. She had no questions and verbalized appreciation.   ----- Message from Melvenia Beam, MD sent at 12/31/2017  6:28 PM EST ----- Echo unremarkable thanks

## 2018-01-03 ENCOUNTER — Inpatient Hospital Stay: Admission: RE | Admit: 2018-01-03 | Payer: Medicare Other | Source: Ambulatory Visit

## 2018-01-04 ENCOUNTER — Ambulatory Visit
Admission: RE | Admit: 2018-01-04 | Discharge: 2018-01-04 | Disposition: A | Discharge status: Home / Self Care | Payer: Medicare Other | Source: Ambulatory Visit | Attending: Nurse Practitioner | Admitting: Nurse Practitioner

## 2018-01-04 DIAGNOSIS — R9389 Abnormal findings on diagnostic imaging of other specified body structures: Secondary | ICD-10-CM

## 2018-01-04 DIAGNOSIS — M4802 Spinal stenosis, cervical region: Secondary | ICD-10-CM

## 2018-01-07 ENCOUNTER — Encounter: Payer: Self-pay | Admitting: Physician Assistant

## 2018-01-07 ENCOUNTER — Other Ambulatory Visit: Payer: Self-pay

## 2018-01-07 ENCOUNTER — Telehealth: Payer: Self-pay | Admitting: Nurse Practitioner

## 2018-01-07 ENCOUNTER — Ambulatory Visit (INDEPENDENT_AMBULATORY_CARE_PROVIDER_SITE_OTHER): Payer: Medicare Other | Admitting: Physician Assistant

## 2018-01-07 VITALS — BP 110/68 | HR 69 | Temp 99.2°F | Resp 18 | Ht 74.0 in | Wt 157.6 lb

## 2018-01-07 DIAGNOSIS — M4802 Spinal stenosis, cervical region: Secondary | ICD-10-CM

## 2018-01-07 DIAGNOSIS — B351 Tinea unguium: Secondary | ICD-10-CM | POA: Diagnosis not present

## 2018-01-07 DIAGNOSIS — R4584 Anhedonia: Secondary | ICD-10-CM

## 2018-01-07 MED ORDER — MELOXICAM 15 MG PO TABS: 7.5000 mg | ORAL_TABLET | Freq: Every day | ORAL | 1 refills | Status: DC | PRN

## 2018-01-07 MED ORDER — FLUCONAZOLE 150 MG PO TABS: 150.0000 mg | ORAL_TABLET | ORAL | 0 refills | Status: DC

## 2018-01-07 NOTE — Telephone Encounter (Signed)
Called sister again to leave  MRI results.  Had to leave message

## 2018-01-07 NOTE — Progress Notes (Signed)
01/07/2018 2:41 PM   DOB: Mar 23, 1946 / MRN: [id number]  SUBJECTIVE:  Brad [name] is a 72 y.o. male presenting for for follow-up of syncope.  Since being in neurology has been placed on Depakote and has had no episodes of LOC.  He takes his medication daily.  His sister would like to discuss his ongoing anhedonia today.  She tells me that he does not get up at any specific time during the day, often misses meals and less she sees him take his meds he may miss medications.  She says that he sits in front of the TV at the dinner table for hours and may not move.  He has been offered free haircuts and free shaves from extended friends of the family and he refuses these.  He may not eat because he does not want to prepare food.  He is not bathing regularly.  Does not have much to say about this and says that "sometimes I just do not feel like eating."He feels that his sister is nagging him him too much and the more he hears her telling him what to do the more he does not want to participate in daily life.  Brad [name] is agreeable to creating a daily schedule that he will try to follow on most days of the week.  Patient's sister would like me to look at his toenails today.  He has No Known Allergies.   He  has a past medical history of Anemia, Arthritis, Cancer ([place]) (2017), Depression, Diabetes mellitus without complication ([place]), History of stomach ulcers, Hypertension, Seizures ([place]), and Substance abuse ([place]).    He  reports that he has been smoking cigarettes.  he has never used smokeless tobacco. He reports that he drinks about 1.2 - 1.8 oz of alcohol per week. He reports that he uses drugs. Drug: Marijuana. He  has no sexual activity history on file. The patient  has a past surgical history that includes herniated disc and Knee surgery ([place]).  His family history is not on file.  Review of Systems  Psychiatric/Behavioral: Negative for hallucinations, memory loss, substance abuse and  suicidal ideas. The patient is not nervous/anxious and does not have insomnia.     The problem list and medications were reviewed and updated by myself where necessary and exist elsewhere in the encounter.   OBJECTIVE:  BP 110/68 (BP Location: Right Arm, Patient Position: Sitting, Cuff Size: Normal)   Pulse 69   Temp 99.2 F (37.3 C) (Oral)   Resp 18   Ht 6\' 2"  (1.88 m)   Wt 157 lb 9.6 oz (71.5 kg)   SpO2 98%   BMI 20.23 kg/m   Physical Exam  Psychiatric: His mood appears not anxious. His affect is not angry, not blunt, not labile and not inappropriate. His speech is not rapid and/or pressured, not delayed, not tangential and not slurred. He is slowed and withdrawn. He is not agitated, not aggressive, not hyperactive, not actively hallucinating and not combative. Thought content is not paranoid and not delusional. Cognition and memory are not impaired. He does not express impulsivity or inappropriate judgment. He expresses no homicidal and no suicidal ideation. He expresses no suicidal plans and no homicidal plans. He is communicative. He exhibits normal recent memory and normal remote memory.  Soft-spoken with good eye contact.  He is mildly argumentative at times. He is attentive.    Lab Results  Component Value Date   ALT 11 07/13/2017   AST 17  07/13/2017   ALKPHOS 100 07/13/2017   BILITOT 0.6 07/13/2017   Lab Results  Component Value Date   TSH 3.610 07/13/2017   Lab Results  Component Value Date   WBC 8.5 10/10/2017   HGB 12.4 (L) 10/10/2017   HCT 38.3 (L) 10/10/2017   MCV 94.3 10/10/2017   PLT 210 10/10/2017      No results found for this or any previous visit (from the past 72 hour(s)).  No results found.  ASSESSMENT AND PLAN:  Brad [name] was seen today for loss of consciousness.  Diagnoses and all orders for this visit:  Anhedonia: I will see him back in about a month.  If he seems to be doing well we will try to avoid medication.  Fortunately he is taking  Depakote now and thus would likely do fine on an SSRI, potentially Prozac, given that this can be somewhat activating.  He has no history of SI/HI.  Onychomycosis -     fluconazole (DIFLUCAN) 150 MG tablet; Take 1 tablet (150 mg total) by mouth once a week. Repeat if needed    The patient is advised to call or return to clinic if he does not see an improvement in symptoms, or to seek the care of the closest emergency department if he worsens with the above plan.   Brad [name], MHS, PA-C [facility] 01/07/2018 2:41 PM

## 2018-01-07 NOTE — Patient Instructions (Addendum)
  1.  Get up at 9 am every day and take your meds brush your teeth  2.  9:30ish go get the paper then eat breakfast.   3. Lunch around noon and then go for a walk 3-4 days a week and on days you don't walk you need to be in [employer] or senior center.   4: Try to eat at about 5-6 every day.   5: Personal care time.  From 4 to 9 pm you need to be taking a bath or shower every other day.     IF you received an x-ray today, you will receive an invoice from [facility]. Please contact [facility] at [phone] with questions or concerns regarding your invoice.   IF you received labwork today, you will receive an invoice from [facility]. Please contact [facility] at [phone] with questions or concerns regarding your invoice.   Our billing staff will not be able to assist you with questions regarding bills from these companies.  You will be contacted with the lab results as soon as they are available. The fastest way to get your results is to activate your My Chart account. Instructions are located on the last page of this paperwork. If you have not heard from [place] regarding the results in 2 weeks, please contact this office.

## 2018-01-07 NOTE — Telephone Encounter (Signed)
Reviewed MRI of cervical spine with Dr.Ahern.  Telephone call to sister regarding MRI.  Left message I would call her back.  MRI shows severe spinal stenosis and  cord impingement.  Patient needs to be seen by neurosurgery.  Will need to make referral

## 2018-01-08 NOTE — Telephone Encounter (Signed)
Please call sister regarding MRI results.  I have already called her twice.  I will set up referral to neurosurgery

## 2018-01-09 NOTE — Telephone Encounter (Signed)
Called [name] Bey-Yellock, pt's sister (on [place]) and discussed that Norwin's MRI of his cervical spine did show severe spinal stenosis with impingement of his spinal cord. A referral has been placed to Neurosurgery and they will be calling her soon to set up an appointment. She verbalized understanding and appreciation for the call.

## 2018-02-04 ENCOUNTER — Other Ambulatory Visit: Payer: Self-pay | Admitting: Physician Assistant

## 2018-02-04 DIAGNOSIS — I10 Essential (primary) hypertension: Secondary | ICD-10-CM

## 2018-02-08 ENCOUNTER — Ambulatory Visit (INDEPENDENT_AMBULATORY_CARE_PROVIDER_SITE_OTHER): Payer: Medicare Other | Admitting: Physician Assistant

## 2018-02-08 ENCOUNTER — Encounter: Payer: Self-pay | Admitting: Physician Assistant

## 2018-02-08 VITALS — BP 132/64 | HR 72 | Temp 97.4°F | Resp 17 | Ht 71.5 in | Wt 153.0 lb

## 2018-02-08 DIAGNOSIS — R4584 Anhedonia: Secondary | ICD-10-CM | POA: Diagnosis not present

## 2018-02-08 DIAGNOSIS — Z8546 Personal history of malignant neoplasm of prostate: Secondary | ICD-10-CM

## 2018-02-08 MED ORDER — FLUOXETINE HCL 20 MG PO CAPS: 20.0000 mg | ORAL_CAPSULE | Freq: Every day | ORAL | 0 refills | Status: DC

## 2018-02-08 NOTE — Patient Instructions (Addendum)
Start the Prozac  today.  Come back in 6 weeks for recheck.  Come back sooner if you are having any problems.  Please await phone call regarding urology.
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
## 2018-02-09 LAB — VALPROIC ACID LEVEL: Valproic Acid Lvl: 27 ug/mL — ABNORMAL LOW (ref 50–100)

## 2018-03-20 ENCOUNTER — Encounter: Payer: Self-pay | Admitting: Physician Assistant

## 2018-03-22 ENCOUNTER — Ambulatory Visit (INDEPENDENT_AMBULATORY_CARE_PROVIDER_SITE_OTHER): Payer: Medicare Other | Admitting: Physician Assistant

## 2018-03-22 ENCOUNTER — Encounter: Payer: Self-pay | Admitting: Physician Assistant

## 2018-03-22 VITALS — BP 156/88 | HR 80 | Temp 98.0°F | Resp 16 | Ht 71.0 in | Wt 148.0 lb

## 2018-03-22 DIAGNOSIS — R4584 Anhedonia: Secondary | ICD-10-CM

## 2018-03-22 DIAGNOSIS — R54 Age-related physical debility: Secondary | ICD-10-CM | POA: Diagnosis not present

## 2018-03-22 DIAGNOSIS — Z9181 History of falling: Secondary | ICD-10-CM | POA: Diagnosis not present

## 2018-03-22 MED ORDER — FLUOXETINE HCL 20 MG PO CAPS: 40.0000 mg | ORAL_CAPSULE | Freq: Every day | ORAL | 0 refills | Status: DC

## 2018-03-22 NOTE — Patient Instructions (Signed)
     IF you received an x-ray today, you will receive an invoice from [facility]. Please contact [facility] at [phone] with questions or concerns regarding your invoice.   IF you received labwork today, you will receive an invoice from [facility]. Please contact [facility] at [phone] with questions or concerns regarding your invoice.   Our billing staff will not be able to assist you with questions regarding bills from these companies.  You will be contacted with the lab results as soon as they are available. The fastest way to get your results is to activate your My Chart account. Instructions are located on the last page of this paperwork. If you have not heard from us regarding the results in 2 weeks, please contact this office.     

## 2018-03-22 NOTE — Progress Notes (Signed)
03/22/2018 2:15 PM   DOB: Sep 26, 1946 / MRN: [id number]  SUBJECTIVE:  Brad [name] is a 72 y.o. male presenting for recheck of anhednia.  His sister Brad [name] is with him today and delivers most of the HPI.  She tells me "Brad [name] is not any better, and still wants to lay in bed and keep away from the family." Brad [name] is concerned about Brad [name]'s safety in the home.  He has a shuffling gait and he has poor balance.  He go tripped up and fell on his left hip a few weeks ago.  Brad [name] would like someone to come into the home to provide medical modifications as needed.   He has No Known Allergies.   He  has a past medical history of Anemia, Arthritis, Cancer ([place]) (2017), Depression, Diabetes mellitus without complication ([place]), History of stomach ulcers, Hypertension, Seizures ([place]), and Substance abuse ([place]).    He  reports that he has been smoking cigarettes.  He has never used smokeless tobacco. He reports that he drinks about 1.2 - 1.8 oz of alcohol per week. He reports that he has current or past drug history. Drug: Marijuana. He  has no sexual activity history on file. The patient  has a past surgical history that includes herniated disc and Knee surgery ([place]).  His family history is not on file.  Review of Systems  Constitutional: Negative for chills, diaphoresis and fever.  Eyes: Negative.   Respiratory: Negative for cough, hemoptysis, sputum production, shortness of breath and wheezing.   Cardiovascular: Negative for chest pain, orthopnea and leg swelling.  Gastrointestinal: Negative for nausea.  Skin: Negative for rash.  Neurological: Negative for dizziness, sensory change, speech change, focal weakness and headaches.    The problem list and medications were reviewed and updated by myself where necessary and exist elsewhere in the encounter.   OBJECTIVE:  BP (!) 156/88   Pulse 80   Temp 98 F (36.7 C) (Oral)   Resp 16   Ht 5\' 11"  (1.803 m)   Wt 148 lb (67.1 kg)   SpO2  98%   BMI 20.64 kg/m   Wt Readings from Last 3 Encounters:  03/22/18 148 lb (67.1 kg)  02/08/18 153 lb (69.4 kg)  01/07/18 157 lb 9.6 oz (71.5 kg)     Physical Exam  Constitutional: He appears well-developed. He is active and cooperative.  Non-toxic appearance.  Cardiovascular: Normal rate, regular rhythm, S1 normal, S2 normal, normal heart sounds, intact distal pulses and normal pulses. Exam reveals no gallop and no friction rub.  No murmur heard. Pulmonary/Chest: Effort normal. No stridor. No tachypnea. No respiratory distress. He has no wheezes. He has no rales.  Abdominal: He exhibits no distension.  Musculoskeletal: He exhibits no edema.  Neurological: He is alert.  Shuffling gait.  He moves slowly and appears to be unsteady. He is frail and weak.   Skin: Skin is warm and dry. He is not diaphoretic. No pallor.  Vitals reviewed.   No results found for this or any previous visit (from the past 72 hour(s)).  No results found.  ASSESSMENT AND PLAN:  Brad [name] was seen today for anhedonia.  Diagnoses and all orders for this visit:  Anhedonia: He needs a higher dose of prozac.  Given physical exam he would benefit from PT.  He also needs to have home health come in and provide him with safety aids within the home.   -     FLUoxetine (PROZAC) 20 MG capsule; Take  2 capsules (40 mg total) by mouth daily. -     Ambulatory referral to Physical Therapy  Frailty -     Ambulatory referral to [place] for falls -     Ambulatory referral to [facility]    The patient is advised to call or return to clinic if he does not see an improvement in symptoms, or to seek the care of the closest emergency department if he worsens with the above plan.   Philis Fendt, MHS, PA-C [facility] 03/22/2018 2:15 PM

## 2018-04-03 ENCOUNTER — Telehealth: Payer: Self-pay | Admitting: Physician Assistant

## 2018-04-03 NOTE — Telephone Encounter (Signed)
Copied from [place]. Topic: Quick Communication - See Telephone Encounter >> Apr 03, 2018  4:04 PM Rutherford Nail, NT wrote: CRM for notification. See Telephone encounter for: 04/03/18. Patient's sister, Oren Binet, calling to see when Physical therapy would be out to work with her brother. Also wondering when someone would be out to bring shower bars? States that they have not heard anything from anyone about physical therapy or home health. Please advise. CB#: [phone]

## 2018-04-04 ENCOUNTER — Telehealth: Payer: Self-pay

## 2018-04-04 NOTE — Telephone Encounter (Signed)
Referral for both Home Health and PT were originally sent to [facility], however, they were unable to take pt's insurance so they cross referred to [facility]. Peter Congo spoke with [facility] and they said they did not receive the referral, so she has re-faxed this. I left pt's sister Oren Binet a message letting her know we had re-sent the referral and [facility] should contact them soon. Thanks!

## 2018-04-04 NOTE — Telephone Encounter (Signed)
Please advise 
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
## 2018-04-08 ENCOUNTER — Telehealth: Payer: Self-pay

## 2018-04-08 NOTE — Telephone Encounter (Signed)
Verbal given.  Copied from [place] [phone]. Topic: General - Other >> Apr 08, 2018 11:50 AM Carolyn Stare wrote:  Dan Europe a PT with [facility] call to ask for verbal orders for 1 time a week for 1 week 2 times a week for 4 weeks effective 04/06/18     Speech and language evaul,home health aide to assist with bath and dressing for 2 times a week for 4 weeks   [phone]  can leave a voice mail

## 2018-04-15 ENCOUNTER — Other Ambulatory Visit: Payer: Self-pay | Admitting: Neurology

## 2018-04-17 ENCOUNTER — Other Ambulatory Visit: Payer: Self-pay | Admitting: *Deleted

## 2018-04-19 ENCOUNTER — Ambulatory Visit (INDEPENDENT_AMBULATORY_CARE_PROVIDER_SITE_OTHER): Payer: Medicare Other | Admitting: Physician Assistant

## 2018-04-19 ENCOUNTER — Encounter: Payer: Self-pay | Admitting: Physician Assistant

## 2018-04-19 VITALS — BP 118/64 | HR 68 | Temp 97.9°F | Ht 73.0 in | Wt 144.2 lb

## 2018-04-19 DIAGNOSIS — R4584 Anhedonia: Secondary | ICD-10-CM

## 2018-04-19 DIAGNOSIS — M2550 Pain in unspecified joint: Secondary | ICD-10-CM | POA: Diagnosis not present

## 2018-04-19 DIAGNOSIS — G8929 Other chronic pain: Secondary | ICD-10-CM

## 2018-04-19 DIAGNOSIS — I10 Essential (primary) hypertension: Secondary | ICD-10-CM

## 2018-04-19 DIAGNOSIS — E7800 Pure hypercholesterolemia, unspecified: Secondary | ICD-10-CM | POA: Diagnosis not present

## 2018-04-19 DIAGNOSIS — L603 Nail dystrophy: Secondary | ICD-10-CM | POA: Diagnosis not present

## 2018-04-19 MED ORDER — ASPIRIN EC 81 MG PO TBEC: 81.0000 mg | DELAYED_RELEASE_TABLET | Freq: Every day | ORAL | 1 refills | Status: DC

## 2018-04-19 MED ORDER — ATORVASTATIN CALCIUM 20 MG PO TABS: 20.0000 mg | ORAL_TABLET | Freq: Every day | ORAL | 1 refills | Status: AC

## 2018-04-19 MED ORDER — MELOXICAM 15 MG PO TABS: 7.5000 mg | ORAL_TABLET | Freq: Every day | ORAL | 1 refills | Status: DC | PRN

## 2018-04-19 MED ORDER — FLUOXETINE HCL 20 MG PO CAPS: 40.0000 mg | ORAL_CAPSULE | Freq: Every day | ORAL | 1 refills | Status: AC

## 2018-04-19 MED ORDER — AMLODIPINE BESYLATE 2.5 MG PO TABS: 2.5000 mg | ORAL_TABLET | Freq: Every day | ORAL | 1 refills | Status: AC

## 2018-04-19 NOTE — Patient Instructions (Addendum)
  Please make an appointment with neurology as you are about to run out of your Depakote.   Please call [facility] [phone] to set up your apppointment.   You will get a call from podiatry to get an appointment.     IF you received an x-ray today, you will receive an invoice from [facility]. Please contact [facility] at [phone] with questions or concerns regarding your invoice.   IF you received labwork today, you will receive an invoice from [facility] [facility]. Please contact [facility] at [phone] with questions or concerns regarding your invoice.   Our billing staff will not be able to assist you with questions regarding bills from these companies.  You will be contacted with the lab results as soon as they are available. The fastest way to get your results is to activate your My Chart account. Instructions are located on the last page of this paperwork. If you have not heard from [place] regarding the results in 2 weeks, please contact this office.

## 2018-04-19 NOTE — Progress Notes (Signed)
04/19/2018 4:55 PM   DOB: Nov 15, 1946 / MRN: [id number]  SUBJECTIVE:  Brad [name] is a 72 y.o. male presenting for medication check.  Patient has a history of anhedonia and depression has been taking 40 mg of Prozac daily and notes no difference in his symptoms.  He denies SI and HI.  He has been having physical therapy come to the house along other home health services if this has been helpful for him.  Has a history of seizure and microvascular ischemic changes seen on head CT.  Patient is compliant with aspirin therapy, statin therapy.  Takes a low dose of Norvasc.  His sister is with him today and ensures that he does not miss any medications.  I have been trying to get them and to urology for over a year now given [name]'s history of prostate cancer however they have not presented despite 2 referrals.  I have asked the sister to call today so she can get him into urology.  He has No Known Allergies.   He  has a past medical history of Anemia, Arthritis, Cancer ([place]) (2017), Depression, Diabetes mellitus without complication ([place]), History of stomach ulcers, Hypertension, Seizures ([place]), and Substance abuse ([place]).    He  reports that he has been smoking cigarettes.  He has never used smokeless tobacco. He reports that he drinks about 1.2 - 1.8 oz of alcohol per week. He reports that he has current or past drug history. Drug: Marijuana. He  has no sexual activity history on file. The patient  has a past surgical history that includes herniated disc and Knee surgery (Right).  His family history is not on file.  Review of Systems  Constitutional: Negative for chills, diaphoresis and fever.  Eyes: Negative.   Respiratory: Negative for cough, hemoptysis, sputum production, shortness of breath and wheezing.   Cardiovascular: Negative for chest pain, orthopnea and leg swelling.  Gastrointestinal: Negative for blood in stool, melena and nausea.  Genitourinary: Negative for dysuria, flank  pain, frequency, hematuria and urgency.  Musculoskeletal: Positive for back pain, joint pain, myalgias and neck pain. Negative for falls.  Skin: Negative for rash.  Neurological: Negative for dizziness, sensory change, speech change, focal weakness and headaches.    The problem list and medications were reviewed and updated by myself where necessary and exist elsewhere in the encounter.   OBJECTIVE:  BP 118/64 (BP Location: Left Arm, Patient Position: Sitting, Cuff Size: Normal)   Pulse 68   Temp 97.9 F (36.6 C) (Oral)   Ht 6\' 1"  (1.854 m)   Wt 144 lb 3.2 oz (65.4 kg)   SpO2 97%   BMI 19.02 kg/m   Physical Exam  Constitutional: He is oriented to person, place, and time. He appears well-developed. He does not appear ill.  Eyes: Pupils are equal, round, and reactive to light. Conjunctivae and EOM are normal.  Cardiovascular: Normal rate, regular rhythm, S1 normal, S2 normal, normal heart sounds, intact distal pulses and normal pulses. Exam reveals no gallop and no friction rub.  No murmur heard. Pulmonary/Chest: Effort normal. No stridor. No respiratory distress. He has no wheezes. He has no rales.  Abdominal: He exhibits no distension.  Musculoskeletal: Normal range of motion. He exhibits no edema.  Neurological: He is alert and oriented to person, place, and time. No cranial nerve deficit or sensory deficit. Coordination normal.  Skin: Skin is warm and dry. He is not diaphoretic.  Psychiatric: Thought content normal. His mood appears not anxious.  His affect is not angry and not inappropriate. His speech is delayed. His speech is not rapid and/or pressured, not tangential and not slurred. He is slowed and withdrawn. He is not actively hallucinating. Cognition and memory are normal. He does not exhibit a depressed mood. He expresses no homicidal and no suicidal ideation. He expresses no suicidal plans and no homicidal plans. He is communicative. He is attentive.  Nursing note and vitals  reviewed.  CBC Latest Ref Rng & Units 10/10/2017 07/13/2017 06/11/2009  WBC 4.0 - 10.5 K/uL 8.5 7.1 5.5  Hemoglobin 13.0 - 17.0 g/dL 12.4(L) 12.5(L) 11.7(L)  Hematocrit 39.0 - 52.0 % 38.3(L) 38.7 37.3(L)  Platelets 150 - 400 K/uL 210 199 238     No results found for this or any previous visit (from the past 72 hour(s)).  No results found.  ASSESSMENT AND PLAN:  Brad [name] was seen today for anhednia and chronic conditions.  Diagnoses and all orders for this visit:  Hypertension, unspecified type Comments: Controlled.  Will continue current plan. Orders: -     amLODipine (NORVASC) 2.5 MG tablet; Take 1 tablet (2.5 mg total) by mouth daily. -     CBC -     Hemoglobin A1c -     Basic metabolic panel  Anhedonia Comments: The patient seems to be feeling somewhat better today.  He has little insight into this problem.  I will continue him at 20 of Prozac for now. Orders: -     FLUoxetine (PROZAC) 20 MG capsule; Take 2 capsules (40 mg total) by mouth daily.  Pure hypercholesterolemia Comments: We will recheck his lipid panel today. Orders: -     atorvastatin (LIPITOR) 20 MG tablet; Take 1 tablet (20 mg total) by mouth daily. -     aspirin EC 81 MG tablet; Take 1 tablet (81 mg total) by mouth daily. -     Hepatic function panel -     Lipid panel  Dystrophic nail -     Ambulatory referral to Podiatry  Chronic joint pain Comments: I am continuing his meloxicam 7.5-15.  He is never complained of any GERD. Orders: -     meloxicam (MOBIC) 15 MG tablet; Take 0.5-1 tablets (7.5-15 mg total) by mouth daily as needed for pain. Always take the lowest effective dose.    The patient is advised to call or return to clinic if he does not see an improvement in symptoms, or to seek the care of the closest emergency department if he worsens with the above plan.   Philis Fendt, MHS, PA-C [facility] 04/19/2018 4:55 PM

## 2018-04-20 NOTE — Progress Notes (Signed)
Please call patient's sister [name] and advise that we need to stop Zarcone Aspirin and Meloxicam due to a mildly decreased hemoglobin and hematocrit. For pain he can take Tylenol 1000 mg every 8 hours as needed. I want to see him back this coming Friday for a recheck H and H. If he becomes dizzy, Sob or starts having chest pain he should proceed to the ED. Please document phone call and route back to me.

## 2018-04-21 LAB — LIPID PANEL
Chol/HDL Ratio: 2.5 ratio (ref 0.0–5.0)
Cholesterol, Total: 109 mg/dL (ref 100–199)
HDL: 44 mg/dL (ref >39)
LDL Calculated: 55 mg/dL (ref 0–99)
Triglycerides: 49 mg/dL (ref 0–149)
VLDL Cholesterol Cal: 10 mg/dL (ref 5–40)

## 2018-04-21 LAB — BASIC METABOLIC PANEL
BUN / CREAT RATIO: 24 (ref 10–24)
BUN: 21 mg/dL (ref 8–27)
CO2: 25 mmol/L (ref 20–29)
CREATININE: 0.86 mg/dL (ref 0.76–1.27)
Calcium: 9.1 mg/dL (ref 8.6–10.2)
Chloride: 103 mmol/L (ref 96–106)
GFR, EST AFRICAN AMERICAN: 102 mL/min/{1.73_m2} (ref >59)
GFR, EST NON AFRICAN AMERICAN: 88 mL/min/{1.73_m2} (ref >59)
Glucose: 85 mg/dL (ref 65–99)
POTASSIUM: 4.2 mmol/L (ref 3.5–5.2)
SODIUM: 142 mmol/L (ref 134–144)

## 2018-04-21 LAB — HEPATIC FUNCTION PANEL
ALT: 14 IU/L (ref 0–44)
AST: 16 IU/L (ref 0–40)
Albumin: 4.2 g/dL (ref 3.5–4.8)
Alkaline Phosphatase: 64 IU/L (ref 39–117)
Bilirubin Total: 0.5 mg/dL (ref 0.0–1.2)
Bilirubin, Direct: 0.18 mg/dL (ref 0.00–0.40)
Total Protein: 6.9 g/dL (ref 6.0–8.5)

## 2018-04-21 LAB — CBC
Hematocrit: 31.6 % — ABNORMAL LOW (ref 37.5–51.0)
Hemoglobin: 10.1 g/dL — ABNORMAL LOW (ref 13.0–17.7)
MCH: 30.2 pg (ref 26.6–33.0)
MCHC: 32 g/dL (ref 31.5–35.7)
MCV: 95 fL (ref 79–97)
PLATELETS: 168 10*3/uL (ref 150–379)
RBC: 3.34 x10E6/uL — AB (ref 4.14–5.80)
RDW: 13.3 % (ref 12.3–15.4)
WBC: 4.7 10*3/uL (ref 3.4–10.8)

## 2018-04-21 LAB — HEMOGLOBIN A1C
Est. average glucose Bld gHb Est-mCnc: 85 mg/dL
Hgb A1c MFr Bld: 4.6 % — ABNORMAL LOW (ref 4.8–5.6)

## 2018-04-22 ENCOUNTER — Telehealth: Payer: Self-pay | Admitting: *Deleted

## 2018-04-22 NOTE — Telephone Encounter (Signed)
-----   Message from Tereasa Coop, PA-C sent at 04/20/2018 11:08 AM EDT ----- Please call patient's sister [name] and advise that we need to stop Gift Aspirin and Meloxicam due to a mildly decreased hemoglobin and hematocrit. For pain he can take Tylenol 1000 mg every 8 hours as needed. I want to see him back this coming Friday for a recheck H and H. If he becomes dizzy, Sob or starts having chest pain he should proceed to the ED. Please document phone call and route back to me.

## 2018-04-22 NOTE — Telephone Encounter (Signed)
I left a detailed message on Brad [name]'s voice mail with a number to call back with any questions.   Crm was put it.    Advised her to to call back with any questions she needs clarification on.

## 2018-04-26 ENCOUNTER — Encounter: Payer: Self-pay | Admitting: Physician Assistant

## 2018-04-26 ENCOUNTER — Ambulatory Visit (INDEPENDENT_AMBULATORY_CARE_PROVIDER_SITE_OTHER): Payer: Medicare Other | Admitting: Physician Assistant

## 2018-04-26 VITALS — BP 102/60 | HR 59 | Temp 97.8°F | Resp 17 | Ht 73.0 in | Wt 145.2 lb

## 2018-04-26 DIAGNOSIS — R7989 Other specified abnormal findings of blood chemistry: Secondary | ICD-10-CM | POA: Diagnosis not present

## 2018-04-26 LAB — POCT CBC
Granulocyte percent: 55.8 %G (ref 37–80)
HEMATOCRIT: 31.7 % — AB (ref 43.5–53.7)
HEMOGLOBIN: 10.3 g/dL — AB (ref 14.1–18.1)
Lymph, poc: 2.2 (ref 0.6–3.4)
MCH: 30.5 pg (ref 27–31.2)
MCHC: 32.5 g/dL (ref 31.8–35.4)
MCV: 93.6 fL (ref 80–97)
MID (cbc): 0.1 (ref 0–0.9)
MPV: 8.7 fL (ref 0–99.8)
POC GRANULOCYTE: 2.8 (ref 2–6.9)
POC LYMPH PERCENT: 43.1 %L (ref 10–50)
POC MID %: 1.1 % (ref 0–12)
Platelet Count, POC: 157 10*3/uL (ref 142–424)
RBC: 3.39 M/uL — AB (ref 4.69–6.13)
RDW, POC: 13.7 %
WBC: 5 10*3/uL (ref 4.6–10.2)

## 2018-04-26 MED ORDER — TRAMADOL-ACETAMINOPHEN 37.5-325 MG PO TABS
1.0000 | ORAL_TABLET | Freq: Two times a day (BID) | ORAL | 0 refills | Status: AC
Start: 2018-04-26 — End: 2018-05-03

## 2018-04-26 MED ORDER — PANTOPRAZOLE SODIUM 40 MG PO TBEC: 40.0000 mg | DELAYED_RELEASE_TABLET | Freq: Every day | ORAL | 0 refills | Status: DC

## 2018-04-26 NOTE — Patient Instructions (Signed)
     IF you received an x-ray today, you will receive an invoice from [facility]. Please contact [facility] at [phone] with questions or concerns regarding your invoice.   IF you received labwork today, you will receive an invoice from [facility]. Please contact [facility] at [phone] with questions or concerns regarding your invoice.   Our billing staff will not be able to assist you with questions regarding bills from these companies.  You will be contacted with the lab results as soon as they are available. The fastest way to get your results is to activate your My Chart account. Instructions are located on the last page of this paperwork. If you have not heard from us regarding the results in 2 weeks, please contact this office.     

## 2018-04-26 NOTE — Progress Notes (Signed)
04/26/2018 5:57 PM   DOB: 1946-08-14 / MRN: [id number]  SUBJECTIVE:  Brad [name] is a 72 y.o. male presenting for recheck of anemia.  Since his last visit here he stopped taking his ASA and meloxicam.  He denies dizziness, malaise, melena and hematochezia.  He tells me he feels well today.  He has No Known Allergies.   He  has a past medical history of Anemia, Arthritis, Cancer ([place]) (2017), Depression, Diabetes mellitus without complication ([place]), History of stomach ulcers, Hypertension, Seizures ([place]), and Substance abuse ([place]).    He  reports that he has quit smoking. His smoking use included cigarettes. He has never used smokeless tobacco. He reports that he drinks about 1.2 - 1.8 oz of alcohol per week. He reports that he has current or past drug history. Drug: Marijuana. He  has no sexual activity history on file. The patient  has a past surgical history that includes herniated disc and Knee surgery (Right).  His family history is not on file.  ROS  The problem list and medications were reviewed and updated by myself where necessary and exist elsewhere in the encounter.   OBJECTIVE:  BP 102/60 (BP Location: Left Arm, Patient Position: Sitting, Cuff Size: Normal)   Pulse (!) 59   Temp 97.8 F (36.6 C) (Oral)   Resp 17   Ht 6\' 1"  (1.854 m)   Wt 145 lb 3.2 oz (65.9 kg)   SpO2 97%   BMI 19.16 kg/m   CBC Latest Ref Rng & Units 04/26/2018 04/19/2018 10/10/2017  WBC 4.6 - 10.2 K/uL 5.0 4.7 8.5  Hemoglobin 14.1 - 18.1 g/dL 10.3(A) 10.1(L) 12.4(L)  Hematocrit 43.5 - 53.7 % 31.7(A) 31.6(L) 38.3(L)  Platelets 150 - 379 x10E3/uL - 168 210   CMP Latest Ref Rng & Units 04/19/2018 10/10/2017 07/13/2017  Glucose 65 - 99 mg/dL 85 116(H) 85  BUN 8 - 27 mg/dL 21 15 12   Creatinine 0.76 - 1.27 mg/dL 0.86 1.04 1.00  Sodium 134 - 144 mmol/L 142 139 142  Potassium 3.5 - 5.2 mmol/L 4.2 3.6 4.0  Chloride 96 - 106 mmol/L 103 104 101  CO2 20 - 29 mmol/L 25 25 23   Calcium 8.6 - 10.2 mg/dL 9.1  9.4 9.6  Total Protein 6.0 - 8.5 g/dL 6.9 - 7.8  Total Bilirubin 0.0 - 1.2 mg/dL 0.5 - 0.6  Alkaline Phos 39 - 117 IU/L 64 - 100  AST 0 - 40 IU/L 16 - 17  ALT 0 - 44 IU/L 14 - 11    Physical Exam  Constitutional: He is oriented to person, place, and time. He appears well-developed. He does not appear ill.  Eyes: Pupils are equal, round, and reactive to light. Conjunctivae and EOM are normal.  Cardiovascular: Normal rate.  Pulmonary/Chest: Effort normal.  Abdominal: He exhibits no distension.  Musculoskeletal: Normal range of motion.  Neurological: He is alert and oriented to person, place, and time. No cranial nerve deficit. Coordination normal.  Skin: Skin is warm and dry. He is not diaphoretic. No pallor.  Psychiatric: He has a normal mood and affect.  Nursing note and vitals reviewed.   Results for orders placed or performed in visit on 04/26/18 (from the past 72 hour(s))  POCT CBC     Status: Abnormal   Collection Time: 04/26/18  5:56 PM  Result Value Ref Range   WBC 5.0 4.6 - 10.2 K/uL   Lymph, poc 2.2 0.6 - 3.4   POC LYMPH PERCENT 43.1  10 - 50 %L   MID (cbc) 0.1 0 - 0.9   POC MID % 1.1 0 - 12 %M   POC Granulocyte 2.8 2 - 6.9   Granulocyte percent 55.8 37 - 80 %G   RBC 3.39 (A) 4.69 - 6.13 M/uL   Hemoglobin 10.3 (A) 14.1 - 18.1 g/dL   HCT, POC 31.7 (A) 43.5 - 53.7 %   MCV 93.6 80 - 97 fL   MCH, POC 30.5 27 - 31.2 pg   MCHC 32.5 31.8 - 35.4 g/dL   RDW, POC 13.7 %   Platelet Count, POC 157 142 - 424 K/uL   MPV 8.7 0 - 99.8 fL    No results found.  ASSESSMENT AND PLAN:  Brad [name] was seen today for medication recheck and bloodwork, dcontinued mobic.  Diagnoses and all orders for this visit:  Abnormal CBC: Patient asymptomatic and without bruising here.  CBC is stable and actually slightly improved since stopping ASA meloxicam.  I will start him on a 14-day course of pantoprazole along with tramadol as he needs for pain.  I will see him back in about 2 weeks for  recheck. -     POCT CBC    The patient is advised to call or return to clinic if he does not see an improvement in symptoms, or to seek the care of the closest emergency department if he worsens with the above plan.   Philis Fendt, MHS, PA-C [facility] 04/26/2018 5:57 PM

## 2018-05-06 ENCOUNTER — Telehealth: Payer: Self-pay | Admitting: Physician Assistant

## 2018-05-06 NOTE — Telephone Encounter (Signed)
Copied from [place] [id number]. Topic: Quick Communication - See Telephone Encounter >> May 06, 2018 11:42 AM Cleaster Corin, NT wrote: CRM for notification. See Telephone encounter for: 05/06/18. Marylin calling from [facility] home health to request verbal orders for pt. For PT 2 week 2 to start this week. And also wanted to let Dr. Nyoka Cowden know that pt. And family would like to start the pace program. Doran Clay can be reached at [phone] (can leave a vm)

## 2018-05-07 NOTE — Telephone Encounter (Signed)
Phone call to Leda Gauze at [facility], verbal orders given for PT.  Provider, FYI regarding PACE program.

## 2018-05-07 NOTE — Telephone Encounter (Signed)
[facility] would be wonderful for [employer]. I can sign an order or referral once it comes my way. Philis Fendt, MS, PA-C 4:35 PM, 05/07/2018

## 2018-05-16 ENCOUNTER — Ambulatory Visit: Payer: Medicare Other | Admitting: Podiatry

## 2018-05-16 DIAGNOSIS — M79609 Pain in unspecified limb: Secondary | ICD-10-CM

## 2018-05-16 DIAGNOSIS — B351 Tinea unguium: Secondary | ICD-10-CM

## 2018-05-16 NOTE — Progress Notes (Signed)
  Subjective:  Patient ID: Brad [name], male    DOB: 02/23/1946,  MRN: [id number]  Chief Complaint  Patient presents with  . Nail Problem    bilateral thick painful toenails   72 y.o. male returns for the above complaint. Reports thick painful toenails that he can't care for himself.  Past Medical History:  Diagnosis Date  . Anemia   . Arthritis   . Cancer [facility]) 2017   prostate  . Depression   . Diabetes mellitus without complication ([place])   . History of stomach ulcers   . Hypertension   . Seizures ([place])   . Substance abuse [facility])    Past Surgical History:  Procedure Laterality Date  . herniated disc    . KNEE SURGERY Right     Current Outpatient Medications:  .  amLODipine (NORVASC) 2.5 MG tablet, Take 1 tablet (2.5 mg total) by mouth daily., Disp: 90 tablet, Rfl: 1 .  atorvastatin (LIPITOR) 20 MG tablet, Take 1 tablet (20 mg total) by mouth daily., Disp: 90 tablet, Rfl: 1 .  divalproex (DEPAKOTE ER) 500 MG 24 hr tablet, Take 1 tablet (500 mg total) by mouth daily. **NEEDS APPOINTMENT FOR FURTHER REFILLS**, Disp: 30 tablet, Rfl: 2 .  FLUoxetine (PROZAC) 20 MG capsule, Take 2 capsules (40 mg total) by mouth daily., Disp: 180 capsule, Rfl: 1 .  pantoprazole (PROTONIX) 40 MG tablet, Take 1 tablet (40 mg total) by mouth daily., Disp: 14 tablet, Rfl: 0  No Known Allergies   Objective:  There were no vitals filed for this visit. General AA&O x3. Normal mood and affect.  Vascular Pedal pulses palpable.  Neurologic Epicritic sensation grossly intact.  Dermatologic No open lesions. Skin normal texture and turgor. Toenails x 10 elongated, thickened, dystrophic.  Orthopedic: Pain to palpation about the toenails.   Assessment & Plan:  Patient was evaluated and treated and all questions answered.  Onychomycosis with pain  -Nails palliatively debrided as below. -Educated on self-care  Procedure: Nail Debridement Rationale: pain  Type of Debridement: manual, sharp  debridement. Instrumentation: Nail nipper, rotary burr. Number of Nails: 10  No follow-ups on file.

## 2018-05-17 ENCOUNTER — Ambulatory Visit: Payer: Medicare Other | Admitting: Physician Assistant

## 2018-05-22 ENCOUNTER — Other Ambulatory Visit: Payer: Self-pay | Admitting: Neurology

## 2018-05-22 ENCOUNTER — Other Ambulatory Visit: Payer: Self-pay

## 2018-05-22 ENCOUNTER — Encounter: Payer: Self-pay | Admitting: Physician Assistant

## 2018-05-22 ENCOUNTER — Ambulatory Visit (INDEPENDENT_AMBULATORY_CARE_PROVIDER_SITE_OTHER): Payer: Medicare Other | Admitting: Physician Assistant

## 2018-05-22 VITALS — BP 114/60 | HR 72 | Temp 97.9°F | Ht 74.0 in | Wt 140.4 lb

## 2018-05-22 DIAGNOSIS — F0151 Vascular dementia with behavioral disturbance: Secondary | ICD-10-CM

## 2018-05-22 DIAGNOSIS — F01518 Vascular dementia, unspecified severity, with other behavioral disturbance: Secondary | ICD-10-CM

## 2018-05-22 DIAGNOSIS — F324 Major depressive disorder, single episode, in partial remission: Secondary | ICD-10-CM | POA: Diagnosis not present

## 2018-05-22 DIAGNOSIS — D649 Anemia, unspecified: Secondary | ICD-10-CM

## 2018-05-22 LAB — POCT CBC
GRANULOCYTE PERCENT: 62.9 % (ref 37–80)
HEMATOCRIT: 35 % — AB (ref 43.5–53.7)
Hemoglobin: 11.4 g/dL — AB (ref 14.1–18.1)
Lymph, poc: 1.7 (ref 0.6–3.4)
MCH: 30.4 pg (ref 27–31.2)
MCHC: 32.6 g/dL (ref 31.8–35.4)
MCV: 93.1 fL (ref 80–97)
MID (CBC): 0.4 (ref 0–0.9)
MPV: 9.7 fL (ref 0–99.8)
PLATELET COUNT, POC: 142 10*3/uL (ref 142–424)
POC GRANULOCYTE: 3.5 (ref 2–6.9)
POC LYMPH PERCENT: 30.6 %L (ref 10–50)
POC MID %: 6.5 %M (ref 0–12)
RBC: 3.76 M/uL — AB (ref 4.69–6.13)
RDW, POC: 13.1 %
WBC: 5.6 10*3/uL (ref 4.6–10.2)

## 2018-05-22 NOTE — Patient Instructions (Addendum)
Come back in 6 weeks.     IF you received an x-ray today, you will receive an invoice from [facility]. Please contact [facility] at [phone] with questions or concerns regarding your invoice.   IF you received labwork today, you will receive an invoice from [facility]. Please contact [facility] at [phone] with questions or concerns regarding your invoice.   Our billing staff will not be able to assist you with questions regarding bills from these companies.  You will be contacted with the lab results as soon as they are available. The fastest way to get your results is to activate your My Chart account. Instructions are located on the last page of this paperwork. If you have not heard from [place] regarding the results in 2 weeks, please contact this office.

## 2018-05-22 NOTE — Progress Notes (Signed)
05/22/2018 11:51 AM   DOB: 1946-08-26 / MRN: [id number]  SUBJECTIVE:  Brad [name] is a 72 y.o. male presenting for follow-up of low-grade anemia, depression, early dementia.  Patient has been losing some weight however recently had his teeth extracted secondary to poor dentition.  States he likes eating peaches and will eat pizza and likes soft fruits.  He does admit his appetite dropping somewhat.  His anhedonic lifestyle continues despite 40 mg of Prozac daily, coaching from his sister, as well as home health.  He will often not changes diaper daily and his sister who is with him today tells me "it is like pulling teeth to get him to take a bath."  He is recently been enrolled in the pace program and there is a start date set and Brad [name] is arguing with his sister at this time because he does not want to go.  He has well-controlled hypertension and takes very low-dose of Norvasc.  His aspirin was recently stopped secondary to a asymptomatic anemia which appears to be improving with the cessation of aspirin.  He was also taking meloxicam for chronic joint pain at that time.  This was also stopped.  He does have vascular plaques on most of the  x-rays that we have taken here.     He has No Known Allergies.   He  has a past medical history of Anemia, Arthritis, Cancer (Brad [name]) (2017), Depression, Diabetes mellitus without complication (Brad [name]), History of stomach ulcers, Hypertension, Seizures (Brad [name]), and Substance abuse (Brad [name]).    He  reports that he has quit smoking. His smoking use included cigarettes. He has never used smokeless tobacco. He reports that he drinks about 1.2 - 1.8 oz of alcohol per week. He reports that he has current or past drug history. Drug: Marijuana. He  has no sexual activity history on file. The patient  has a past surgical history that includes herniated disc and Knee surgery (Right).  His family history is not on file.  Review of Systems  Constitutional: Negative for  chills, diaphoresis and fever.  Gastrointestinal: Negative for nausea.  Skin: Negative for rash.  Neurological: Negative for dizziness.  Psychiatric/Behavioral: Positive for depression. Negative for hallucinations, memory loss, substance abuse and suicidal ideas. The patient is not nervous/anxious and does not have insomnia.     The problem list and medications were reviewed and updated by myself where necessary and exist elsewhere in the encounter.   OBJECTIVE:  BP 114/60 (BP Location: Left Arm, Patient Position: Sitting, Cuff Size: Normal)   Pulse 72   Temp 97.9 F (36.6 C) (Oral)   Ht 6\' 2"  (1.88 m)   Wt 140 lb 6.4 oz (63.7 kg)   SpO2 100%   BMI 18.03 kg/m   BP Readings from Last 3 Encounters:  05/22/18 114/60  04/26/18 102/60  04/19/18 118/64   Wt Readings from Last 3 Encounters:  05/22/18 140 lb 6.4 oz (63.7 kg)  04/26/18 145 lb 3.2 oz (65.9 kg)  04/19/18 144 lb 3.2 oz (65.4 kg)   Pulse Readings from Last 3 Encounters:  05/22/18 72  04/26/18 (!) 59  04/19/18 68     Physical Exam  Constitutional: He is oriented to person, place, and time. He appears well-developed. He does not appear ill.  Eyes: Pupils are equal, round, and reactive to light. Conjunctivae and EOM are normal.  Cardiovascular: Normal rate.  Pulmonary/Chest: Effort normal.  Abdominal: He exhibits no distension.  Musculoskeletal: Normal range of motion.  Neurological: He is alert and oriented to person, place, and time. No cranial nerve deficit. Coordination normal.  Skin: Skin is warm and dry. He is not diaphoretic.  Psychiatric: He has a normal mood and affect. His speech is delayed. His speech is not tangential and not slurred. He is slowed and withdrawn. He is not agitated, not aggressive, not hyperactive and not combative. Thought content is not paranoid and not delusional. Cognition and memory are not impaired. He expresses no homicidal and no suicidal ideation. He expresses no suicidal plans and  no homicidal plans. He is communicative. He exhibits normal recent memory and normal remote memory.  Seen smiling often during the interview on exam.  Nursing note and vitals reviewed.   CBC Latest Ref Rng & Units 05/22/2018 04/26/2018 04/19/2018  WBC 4.6 - 10.2 K/uL 5.6 5.0 4.7  Hemoglobin 14.1 - 18.1 g/dL 11.4(A) 10.3(A) 10.1(L)  Hematocrit 43.5 - 53.7 % 35.0(A) 31.7(A) 31.6(L)  Platelets 150 - 379 x10E3/uL - - 168     Lab Results  Component Value Date   WBC 5.6 05/22/2018   HGB 11.4 (A) 05/22/2018   HCT 35.0 (A) 05/22/2018   MCV 93.1 05/22/2018   PLT 168 04/19/2018    Lab Results  Component Value Date   CREATININE 0.86 04/19/2018   BUN 21 04/19/2018   NA 142 04/19/2018   K 4.2 04/19/2018   CL 103 04/19/2018   CO2 25 04/19/2018    Lab Results  Component Value Date   ALT 14 04/19/2018   AST 16 04/19/2018   ALKPHOS 64 04/19/2018   BILITOT 0.5 04/19/2018    Lab Results  Component Value Date   TSH 3.610 07/13/2017    Lab Results  Component Value Date   HGBA1C 4.6 (L) 04/19/2018    Lab Results  Component Value Date   CHOL 109 04/19/2018   HDL 44 04/19/2018   LDLCALC 55 04/19/2018   TRIG 49 04/19/2018   CHOLHDL 2.5 04/19/2018     Results for orders placed or performed in visit on 05/22/18 (from the past 72 hour(s))  POCT CBC     Status: Abnormal   Collection Time: 05/22/18 11:05 AM  Result Value Ref Range   WBC 5.6 4.6 - 10.2 K/uL   Lymph, poc 1.7 0.6 - 3.4   POC LYMPH PERCENT 30.6 10 - 50 %L   MID (cbc) 0.4 0 - 0.9   POC MID % 6.5 0 - 12 %M   POC Granulocyte 3.5 2 - 6.9   Granulocyte percent 62.9 37 - 80 %G   RBC 3.76 (A) 4.69 - 6.13 M/uL   Hemoglobin 11.4 (A) 14.1 - 18.1 g/dL   HCT, POC 35.0 (A) 43.5 - 53.7 %   MCV 93.1 80 - 97 fL   MCH, POC 30.4 27 - 31.2 pg   MCHC 32.6 31.8 - 35.4 g/dL   RDW, POC 13.1 %   Platelet Count, POC 142 142 - 424 K/uL   MPV 9.7 0 - 99.8 fL    No results found.  ASSESSMENT AND PLAN:  Brad [name] was seen today for  medication check.  Diagnoses and all orders for this visit:  Anemia, unspecified type -     POCT CBC  Major depressive disorder with single episode, in partial remission Brad [name])  Vascular dementia with behavior disturbance: His sister [name] stopped home health because Brison was not putting any effort into getting better.  This was mostly related to physical therapy it sounds.  I  worried that Lucciano may not be taking proper hygiene measures and I have encouraged Vyrnette to allow skilled nursing more home health aide to come in and care for Grigor at least 3 days a week.  She requests that I referred the patient.  The patient is advised to call or return to clinic if he does not see an improvement in symptoms, or to seek the care of the closest emergency department if he worsens with the above plan.   Philis Fendt, MHS, PA-C [facility] 05/22/2018 11:51 AM

## 2018-07-03 ENCOUNTER — Ambulatory Visit: Payer: Medicare Other | Admitting: Physician Assistant

## 2018-07-24 ENCOUNTER — Ambulatory Visit: Payer: Medicare Other | Admitting: Physician Assistant

## 2018-10-16 ENCOUNTER — Ambulatory Visit
Admission: RE | Admit: 2018-10-16 | Discharge: 2018-10-16 | Disposition: A | Discharge status: Home / Self Care | Payer: Medicare Other | Source: Ambulatory Visit | Attending: Family Medicine | Admitting: Family Medicine

## 2018-10-16 ENCOUNTER — Other Ambulatory Visit: Payer: Self-pay | Admitting: Family Medicine

## 2018-10-16 DIAGNOSIS — M25561 Pain in right knee: Secondary | ICD-10-CM

## 2018-10-21 ENCOUNTER — Telehealth: Payer: Self-pay | Admitting: *Deleted

## 2018-10-21 NOTE — Telephone Encounter (Signed)
Received call from Dr Lenise Herald, [facility] who stated she is now the patient's PCP.  She asked what ever happened to his referral to a neurosurgeon.  This RN advised her it was sent the day it was ordered to [place] [facility]. Advised her that sometimes the patient fails to follow up with this office if they don't hear back about the referal, or the patient doesn't return the call from the office to schedule the new appointment.  Advised her this RN is unable to see the neurosurgery office's notes or appointments. She then asked about his FU here. This RN stated he was to be seen in 6 months but no appointment was made. She stated she will have [facility] call and schedule his FU. This RN advised her his seizure medication was refilled in June x 3 months with note to call for appt. She stated she is now refilling his seizure medication.  She asked to be listed as his PCP, verbalized understanding, appreciation of information. She stated she is going to call neurosurgeon's office to check on referral.  She asked to be added as patient's PCP with her direct number [phone].

## 2018-10-24 ENCOUNTER — Other Ambulatory Visit: Payer: Self-pay | Admitting: Physician Assistant

## 2018-10-24 DIAGNOSIS — G8929 Other chronic pain: Secondary | ICD-10-CM

## 2018-10-24 DIAGNOSIS — M2550 Pain in unspecified joint: Principal | ICD-10-CM

## 2019-02-18 ENCOUNTER — Other Ambulatory Visit: Payer: Self-pay | Admitting: Gerontology

## 2019-02-18 DIAGNOSIS — M4802 Spinal stenosis, cervical region: Secondary | ICD-10-CM

## 2019-02-22 ENCOUNTER — Ambulatory Visit
Admission: RE | Admit: 2019-02-22 | Discharge: 2019-02-22 | Disposition: A | Discharge status: Home / Self Care | Payer: No Typology Code available for payment source | Source: Ambulatory Visit | Attending: Gerontology | Admitting: Gerontology

## 2019-02-22 DIAGNOSIS — M4802 Spinal stenosis, cervical region: Secondary | ICD-10-CM

## 2019-02-26 ENCOUNTER — Other Ambulatory Visit: Payer: Self-pay

## 2019-05-08 DIAGNOSIS — Z1159 Encounter for screening for other viral diseases: Secondary | ICD-10-CM | POA: Diagnosis not present

## 2019-05-09 ENCOUNTER — Encounter: Payer: Self-pay | Admitting: Internal Medicine

## 2019-05-09 NOTE — Progress Notes (Signed)
This encounter was created in error - please disregard.

## 2019-05-13 ENCOUNTER — Other Ambulatory Visit (HOSPITAL_COMMUNITY): Payer: Self-pay | Admitting: Gerontology

## 2019-05-13 ENCOUNTER — Other Ambulatory Visit: Payer: Self-pay | Admitting: Gerontology

## 2019-05-13 DIAGNOSIS — R1319 Other dysphagia: Secondary | ICD-10-CM

## 2019-05-15 ENCOUNTER — Other Ambulatory Visit (HOSPITAL_COMMUNITY): Payer: Self-pay

## 2019-05-15 DIAGNOSIS — R1310 Dysphagia, unspecified: Secondary | ICD-10-CM

## 2019-05-19 ENCOUNTER — Other Ambulatory Visit: Payer: Self-pay

## 2019-05-19 ENCOUNTER — Encounter (HOSPITAL_COMMUNITY): Payer: Self-pay

## 2019-05-19 ENCOUNTER — Ambulatory Visit (HOSPITAL_COMMUNITY)
Admission: RE | Admit: 2019-05-19 | Discharge: 2019-05-19 | Disposition: A | Discharge status: Home / Self Care | Payer: Medicaid Other | Source: Ambulatory Visit | Attending: Gerontology | Admitting: Gerontology

## 2019-05-19 DIAGNOSIS — R1319 Other dysphagia: Secondary | ICD-10-CM

## 2019-05-21 DIAGNOSIS — G8929 Other chronic pain: Secondary | ICD-10-CM | POA: Diagnosis not present

## 2019-05-21 DIAGNOSIS — M4722 Other spondylosis with radiculopathy, cervical region: Secondary | ICD-10-CM | POA: Diagnosis not present

## 2019-05-21 DIAGNOSIS — M4802 Spinal stenosis, cervical region: Secondary | ICD-10-CM | POA: Diagnosis not present

## 2019-05-21 DIAGNOSIS — R1312 Dysphagia, oropharyngeal phase: Secondary | ICD-10-CM | POA: Diagnosis not present

## 2019-05-21 DIAGNOSIS — R1310 Dysphagia, unspecified: Secondary | ICD-10-CM | POA: Diagnosis not present

## 2019-05-21 DIAGNOSIS — G9529 Other cord compression: Secondary | ICD-10-CM | POA: Diagnosis not present

## 2019-05-21 DIAGNOSIS — M4712 Other spondylosis with myelopathy, cervical region: Secondary | ICD-10-CM | POA: Diagnosis not present

## 2019-05-21 DIAGNOSIS — R569 Unspecified convulsions: Secondary | ICD-10-CM | POA: Diagnosis not present

## 2019-05-21 DIAGNOSIS — I10 Essential (primary) hypertension: Secondary | ICD-10-CM | POA: Diagnosis not present

## 2019-05-21 DIAGNOSIS — I69891 Dysphagia following other cerebrovascular disease: Secondary | ICD-10-CM | POA: Diagnosis not present

## 2019-05-21 DIAGNOSIS — R2681 Unsteadiness on feet: Secondary | ICD-10-CM | POA: Diagnosis not present

## 2019-05-21 DIAGNOSIS — E440 Moderate protein-calorie malnutrition: Secondary | ICD-10-CM | POA: Diagnosis not present

## 2019-05-29 ENCOUNTER — Other Ambulatory Visit (HOSPITAL_COMMUNITY): Payer: Self-pay

## 2019-05-29 ENCOUNTER — Other Ambulatory Visit: Payer: Self-pay

## 2019-05-29 ENCOUNTER — Ambulatory Visit (HOSPITAL_COMMUNITY)
Admission: RE | Admit: 2019-05-29 | Discharge: 2019-05-29 | Disposition: A | Discharge status: Home / Self Care | Payer: Medicare (Managed Care) | Source: Ambulatory Visit | Attending: Internal Medicine | Admitting: Internal Medicine

## 2019-05-29 ENCOUNTER — Ambulatory Visit (HOSPITAL_COMMUNITY)
Admission: RE | Admit: 2019-05-29 | Discharge: 2019-05-29 | Disposition: A | Discharge status: Home / Self Care | Payer: Medicare (Managed Care) | Source: Ambulatory Visit | Attending: Gerontology | Admitting: Gerontology

## 2019-05-29 ENCOUNTER — Other Ambulatory Visit (HOSPITAL_COMMUNITY): Payer: Self-pay | Admitting: Gerontology

## 2019-05-29 DIAGNOSIS — R1319 Other dysphagia: Secondary | ICD-10-CM

## 2019-05-29 DIAGNOSIS — R1310 Dysphagia, unspecified: Secondary | ICD-10-CM

## 2019-05-30 ENCOUNTER — Other Ambulatory Visit (HOSPITAL_COMMUNITY): Payer: Self-pay

## 2019-05-30 DIAGNOSIS — R1310 Dysphagia, unspecified: Secondary | ICD-10-CM

## 2019-06-04 ENCOUNTER — Encounter (HOSPITAL_COMMUNITY): Payer: Self-pay

## 2019-06-04 ENCOUNTER — Ambulatory Visit (HOSPITAL_COMMUNITY): Payer: Medicaid Other

## 2019-06-04 ENCOUNTER — Encounter (HOSPITAL_COMMUNITY): Payer: Medicaid Other

## 2019-09-16 DIAGNOSIS — Z79899 Other long term (current) drug therapy: Secondary | ICD-10-CM | POA: Diagnosis not present

## 2019-09-16 DIAGNOSIS — Z1159 Encounter for screening for other viral diseases: Secondary | ICD-10-CM | POA: Diagnosis not present

## 2019-10-13 DIAGNOSIS — R2681 Unsteadiness on feet: Secondary | ICD-10-CM | POA: Diagnosis not present

## 2019-10-13 DIAGNOSIS — Z636 Dependent relative needing care at home: Secondary | ICD-10-CM | POA: Diagnosis not present

## 2019-10-13 DIAGNOSIS — R5381 Other malaise: Secondary | ICD-10-CM | POA: Diagnosis not present

## 2019-11-03 DIAGNOSIS — N401 Enlarged prostate with lower urinary tract symptoms: Secondary | ICD-10-CM | POA: Diagnosis not present

## 2019-11-03 DIAGNOSIS — R3915 Urgency of urination: Secondary | ICD-10-CM | POA: Diagnosis not present

## 2019-11-03 DIAGNOSIS — R350 Frequency of micturition: Secondary | ICD-10-CM | POA: Diagnosis not present

## 2019-11-03 DIAGNOSIS — C61 Malignant neoplasm of prostate: Secondary | ICD-10-CM | POA: Diagnosis not present

## 2020-02-13 ENCOUNTER — Ambulatory Visit: Payer: Medicaid Other

## 2020-02-19 ENCOUNTER — Ambulatory Visit: Payer: Medicare Other

## 2020-03-11 ENCOUNTER — Encounter: Payer: Self-pay | Admitting: Gastroenterology

## 2020-03-25 ENCOUNTER — Telehealth: Payer: Self-pay | Admitting: *Deleted

## 2020-03-25 NOTE — Telephone Encounter (Signed)
Per Dr. Loletha Carrow, I called this pt's sister.  According to referral note, pt does not take care of himself- he does not bathe, change clothes, or even brush his teeth.  He only walks from his bed to the bathroom and kitchen.  Dr. Loletha Carrow reviewed all of this and asked me to call pt's sister to see if she feels he is even interested in having this procedure.  I explained the prep to her and she says he will definitely not do it.  I told her to call us back if he has any issues in the future and understanding voiced.

## 2020-04-19 ENCOUNTER — Encounter: Payer: Medicare Other | Admitting: Gastroenterology

## 2020-04-22 ENCOUNTER — Encounter (HOSPITAL_COMMUNITY): Payer: Self-pay

## 2020-04-22 ENCOUNTER — Emergency Department (HOSPITAL_COMMUNITY): Payer: Medicare Other

## 2020-04-22 ENCOUNTER — Other Ambulatory Visit: Payer: Self-pay

## 2020-04-22 ENCOUNTER — Emergency Department (HOSPITAL_COMMUNITY)
Admission: EM | Admit: 2020-04-22 | Discharge: 2020-04-23 | Disposition: A | Discharge status: Home / Self Care | Payer: Medicare Other | Attending: Emergency Medicine | Admitting: Emergency Medicine

## 2020-04-22 DIAGNOSIS — R569 Unspecified convulsions: Secondary | ICD-10-CM | POA: Insufficient documentation

## 2020-04-22 DIAGNOSIS — Z79899 Other long term (current) drug therapy: Secondary | ICD-10-CM | POA: Diagnosis not present

## 2020-04-22 DIAGNOSIS — I10 Essential (primary) hypertension: Secondary | ICD-10-CM | POA: Insufficient documentation

## 2020-04-22 DIAGNOSIS — M25559 Pain in unspecified hip: Secondary | ICD-10-CM

## 2020-04-22 DIAGNOSIS — M25552 Pain in left hip: Secondary | ICD-10-CM | POA: Insufficient documentation

## 2020-04-22 DIAGNOSIS — Z7984 Long term (current) use of oral hypoglycemic drugs: Secondary | ICD-10-CM | POA: Insufficient documentation

## 2020-04-22 DIAGNOSIS — E119 Type 2 diabetes mellitus without complications: Secondary | ICD-10-CM | POA: Insufficient documentation

## 2020-04-22 LAB — COMPREHENSIVE METABOLIC PANEL
ALT: 10 U/L (ref 0–44)
AST: 19 U/L (ref 15–41)
Albumin: 3.3 g/dL — ABNORMAL LOW (ref 3.5–5.0)
Alkaline Phosphatase: 54 U/L (ref 38–126)
Anion gap: 9 (ref 5–15)
BUN: 15 mg/dL (ref 8–23)
CO2: 23 mmol/L (ref 22–32)
Calcium: 7.7 mg/dL — ABNORMAL LOW (ref 8.9–10.3)
Chloride: 110 mmol/L (ref 98–111)
Creatinine, Ser: 0.9 mg/dL (ref 0.61–1.24)
GFR calc Af Amer: >60 mL/min (ref >60)
GFR calc non Af Amer: >60 mL/min (ref >60)
Glucose, Bld: 139 mg/dL — ABNORMAL HIGH (ref 70–99)
Potassium: 2.9 mmol/L — ABNORMAL LOW (ref 3.5–5.1)
Sodium: 142 mmol/L (ref 135–145)
Total Bilirubin: 0.4 mg/dL (ref 0.3–1.2)
Total Protein: 6 g/dL — ABNORMAL LOW (ref 6.5–8.1)

## 2020-04-22 LAB — CBC WITH DIFFERENTIAL/PLATELET
Abs Immature Granulocytes: 0.01 10*3/uL (ref 0.00–0.07)
Basophils Absolute: 0 10*3/uL (ref 0.0–0.1)
Basophils Relative: 0 %
Eosinophils Absolute: 0 10*3/uL (ref 0.0–0.5)
Eosinophils Relative: 1 %
HCT: 33.1 % — ABNORMAL LOW (ref 39.0–52.0)
Hemoglobin: 10.2 g/dL — ABNORMAL LOW (ref 13.0–17.0)
Immature Granulocytes: 0 %
Lymphocytes Relative: 16 %
Lymphs Abs: 0.8 10*3/uL (ref 0.7–4.0)
MCH: 30.4 pg (ref 26.0–34.0)
MCHC: 30.8 g/dL (ref 30.0–36.0)
MCV: 98.8 fL (ref 80.0–100.0)
Monocytes Absolute: 0.3 10*3/uL (ref 0.1–1.0)
Monocytes Relative: 6 %
Neutro Abs: 3.9 10*3/uL (ref 1.7–7.7)
Neutrophils Relative %: 77 %
Platelets: 138 10*3/uL — ABNORMAL LOW (ref 150–400)
RBC: 3.35 MIL/uL — ABNORMAL LOW (ref 4.22–5.81)
RDW: 12.5 % (ref 11.5–15.5)
WBC: 5.1 10*3/uL (ref 4.0–10.5)
nRBC: 0 % (ref 0.0–0.2)

## 2020-04-22 LAB — TROPONIN I (HIGH SENSITIVITY): Troponin I (High Sensitivity): 4 ng/L (ref <18)

## 2020-04-22 LAB — ETHANOL: Alcohol, Ethyl (B): <10 mg/dL (ref <10)

## 2020-04-22 LAB — VALPROIC ACID LEVEL: Valproic Acid Lvl: 13 ug/mL — ABNORMAL LOW (ref 50.0–100.0)

## 2020-04-22 MED ORDER — POTASSIUM CHLORIDE CRYS ER 20 MEQ PO TBCR
40.0000 meq | EXTENDED_RELEASE_TABLET | Freq: Once | ORAL | Status: AC
  Administered 2020-04-22: 20:00:00 40 meq via ORAL
  Filled 2020-04-22: qty 2

## 2020-04-22 MED ORDER — DIVALPROEX SODIUM ER 500 MG PO TB24: 500.0000 mg | ORAL_TABLET | Freq: Every day | ORAL | 1 refills | Status: DC

## 2020-04-22 MED ORDER — DIVALPROEX SODIUM ER 500 MG PO TB24
500.0000 mg | ORAL_TABLET | Freq: Every day | ORAL | Status: DC
  Administered 2020-04-22: 500 mg via ORAL
  Filled 2020-04-22: qty 1

## 2020-04-22 MED ORDER — ACETAMINOPHEN 500 MG PO TABS
1000.0000 mg | ORAL_TABLET | Freq: Once | ORAL | Status: AC
  Administered 2020-04-22: 1000 mg via ORAL
  Filled 2020-04-22: qty 2

## 2020-04-22 NOTE — ED Notes (Signed)
Called lab to see why labs weren't in process

## 2020-04-22 NOTE — ED Notes (Addendum)
This RN called and updated pts sister and confirmed address and some one will be home.    Pt provided with sandwich and water.

## 2020-04-22 NOTE — ED Triage Notes (Signed)
Pt arrived via [facility] from home CC syncope episode. Pts family reports "period of unresponsiveness and eyes rolling back in head" denies any convulsion, or falls/trauma. Pt reports left hip pain un related to recent injury.   20G left hand/wrist   Hx Keppra medication, CVA with lower extremity weakness, bursitis of left hip, diabetes

## 2020-04-22 NOTE — ED Provider Notes (Signed)
[facility] DEPT Provider Note   CSN: [id number] Arrival date & time: 04/22/20  1528     History Chief Complaint  Patient presents with  . Possible Seizure  . Near Syncope    Brad [name] is a 74 y.o. male.  HPI Patient had an episode while sitting at the kitchen table whereby he started leaning back really far in the chair and was not responding to his sister.  Patient reports the last thing he recalls is he was sitting at the kitchen table and thinking about going into his room.  His sister reports she had said some Tylenol out for him to take and then came back and checked on him.  He seemed to be sitting in a very odd position with his head turned slightly.  She then went to try to help him sit upright and he seemed to be collapsing back but pushing somewhat with his foot.  He reports his eyes were open but he could not respond at all.  She was able to get him to the floor without injury.  She reports the whole episode seem to last about 10 minutes until EMS came.  Patient reports he does not recall any of those things happening.  He reports his main concern is that he has a lot of pain in his left hip.  He does report that is been a chronic and persistent problem.  He reports sometimes the leg gives out on him.  His sister reports that has been an ongoing issue.  She tries to give him Tylenol for pain which he takes sometimes but not consistently.    Past Medical History:  Diagnosis Date  . Anemia   . Arthritis   . Cancer [facility]) 2017   prostate  . Depression   . Diabetes mellitus without complication ([place])   . History of stomach ulcers   . Hypertension   . Seizures ([place])   . Substance abuse [facility])     Patient Active Problem List   Diagnosis Date Noted  . Cervical spinal stenosis 12/14/2017  . Abnormal CT scan, neck 12/14/2017  . History of stroke 12/12/2017  . Hyperlipidemia 12/12/2017  . Seizure disorder ([place]) 12/12/2017  . KNEE PAIN,  RIGHT 06/11/2009  . NECK PAIN 06/11/2009  . Hypertension 02/13/2008  . DENTAL CARIES 02/11/2008    Past Surgical History:  Procedure Laterality Date  . herniated disc    . KNEE SURGERY Right        Family History  Problem Relation Age of Onset  . Seizures Neg Hx     Social History   Tobacco Use  . Smoking status: Former Smoker    Types: Cigarettes  . Smokeless tobacco: Never Used  . Tobacco comment: occasional- patient smokes some marijauna from time to time  Substance Use Topics  . Alcohol use: Yes    Alcohol/week: 2.0 - 3.0 standard drinks    Types: 2 - 3 Cans of beer per week    Comment: occasional  . Drug use: Yes    Types: Marijuana    Comment: occasional    Home Medications Prior to Admission medications   Medication Sig Start Date End Date Taking? Authorizing Provider  amLODipine (NORVASC) 2.5 MG tablet Take 1 tablet (2.5 mg total) by mouth daily. 04/19/18  Yes Tereasa Coop, PA-C  atorvastatin (LIPITOR) 20 MG tablet Take 1 tablet (20 mg total) by mouth daily. 04/19/18  Yes Tereasa Coop, PA-C  diclofenac Sodium (  VOLTAREN) 1 % GEL Apply 2 g topically 4 (four) times daily as needed (pain).  12/31/19  Yes [provider]  divalproex (DEPAKOTE ER) 500 MG 24 hr tablet Take 1 tablet (500 mg total) by mouth daily. Pt needs to call [phone] to schedule an appt for continued refills. 05/22/18  Yes Melvenia Beam, MD  FLUoxetine (PROZAC) 20 MG capsule Take 2 capsules (40 mg total) by mouth daily. 04/19/18  Yes Tereasa Coop, PA-C  meloxicam (MOBIC) 15 MG tablet Take 15 mg by mouth daily. 12/31/19  Yes [provider]  mirtazapine (REMERON) 15 MG tablet Take 15 mg by mouth at bedtime. 10/30/19  Yes [provider]  divalproex (DEPAKOTE ER) 500 MG 24 hr tablet Take 1 tablet (500 mg total) by mouth daily. 04/22/20   Charlesetta Shanks, MD  pantoprazole (PROTONIX) 40 MG tablet Take 1 tablet (40 mg total) by mouth daily. Patient not taking: Reported on  04/22/2020 04/26/18   Tereasa Coop, PA-C    Allergies    Patient has no known allergies.  Review of Systems   Review of Systems 10 Systems reviewed and are negative for acute change except as noted in the HPI.  Physical Exam Updated Vital Signs BP (!) 144/67   Pulse 62   Temp 98.4 F (36.9 C) (Oral)   Resp 15   SpO2 100%   Physical Exam Constitutional:      Comments: Alert and nontoxic.  No respiratory distress.  Mental status clear.  HENT:     Head: Normocephalic and atraumatic.     Mouth/Throat:     Mouth: Mucous membranes are moist.     Pharynx: Oropharynx is clear.  Eyes:     Extraocular Movements: Extraocular movements intact.     Pupils: Pupils are equal, round, and reactive to light.  Cardiovascular:     Rate and Rhythm: Normal rate and regular rhythm.  Pulmonary:     Effort: Pulmonary effort is normal.     Breath sounds: Normal breath sounds.  Abdominal:     General: There is no distension.     Palpations: Abdomen is soft.     Tenderness: There is no abdominal tenderness. There is no guarding.  Musculoskeletal:     Cervical back: Neck supple.     Comments: Patient reports he does have pain to some compression over the left hip.  He can however spontaneous flex and extend.  No significant contusions or abrasions to extremities.  No deformities.  Skin:    General: Skin is warm and dry.  Neurological:     General: No focal deficit present.     Mental Status: He is oriented to person, place, and time.     Cranial Nerves: No cranial nerve deficit.     Coordination: Coordination normal.  Psychiatric:        Mood and Affect: Mood normal.     ED Results / Procedures / Treatments   Labs (all labs ordered are listed, but only abnormal results are displayed) Labs Reviewed  COMPREHENSIVE METABOLIC PANEL - Abnormal; Notable for the following components:      Result Value   Potassium 2.9 (*)    Glucose, Bld 139 (*)    Calcium 7.7 (*)    Total Protein 6.0 (*)     Albumin 3.3 (*)    All other components within normal limits  CBC WITH DIFFERENTIAL/PLATELET - Abnormal; Notable for the following components:   RBC 3.35 (*)    Hemoglobin  10.2 (*)    HCT 33.1 (*)    Platelets 138 (*)    All other components within normal limits  VALPROIC ACID LEVEL - Abnormal; Notable for the following components:   Valproic Acid Lvl 13 (*)    All other components within normal limits  ETHANOL  URINALYSIS, ROUTINE W REFLEX MICROSCOPIC  RAPID URINE DRUG SCREEN, HOSP PERFORMED  TROPONIN I (HIGH SENSITIVITY)  TROPONIN I (HIGH SENSITIVITY)    EKG None  Radiology DG Hip Unilat With Pelvis 2-3 Views Left  Result Date: 04/22/2020 CLINICAL DATA:  Left hip pain EXAM: DG HIP (WITH OR WITHOUT PELVIS) 2-3V LEFT COMPARISON:  None. FINDINGS: Frontal view of the pelvis as well as frontal and frogleg lateral views of the left hip are obtained. There is bilateral hip osteoarthritis, left greater than right, moderate in severity. No fracture, subluxation, or dislocation within either hip. There is extensive spondylosis at the lumbosacral junction, with marked osteophyte formation at L5/S1. Sacroiliac joints are normal. IMPRESSION: 1. No acute fracture. 2. Significant bilateral hip osteoarthritis, left greater than right. 3. Significant lower lumbar spondylosis. Electronically Signed   By: Randa Ngo M.D.   On: 04/22/2020 17:35    Procedures Procedures (including critical care time)  Medications Ordered in ED Medications  divalproex (DEPAKOTE ER) 24 hr tablet 500 mg (500 mg Oral Given 04/22/20 2159)  potassium chloride SA (KLOR-CON) CR tablet 40 mEq (40 mEq Oral Given 04/22/20 2025)  acetaminophen (TYLENOL) tablet 1,000 mg (1,000 mg Oral Given 04/22/20 2159)    ED Course  I have reviewed the triage vital signs and the nursing notes.  Pertinent labs & imaging results that were available during my care of the patient were reviewed by me and considered in my medical decision  making (see chart for details).    MDM Rules/Calculators/A&P                      Patient was supposed to have Depakote per review of EMR and Dr. Chong Sicilian last note.  Patient sister reviewed his medications that she has and notes that he does not have any Depakote.  She reports that they had to change doctors couple of times due to doctors retiring and she does not think it was ever represcribed.  Patient reports he has not had a follow-up with Dr. Lavell Anchors for several years.  At this time the description of the event sounds most consistent with seizure.  Patient has been alert since arrival to the emergency department.  No focal motor deficits.  Patient's main focus is problems with back and hip pain.  This has been longstanding and persistent.  Have counseled the patient on necessity to follow-up with PCP and schedule appointment with [facility] neurosurgery.  Patient sister is his main caregiver.  All this has been reviewed with the sister over the phone. Final Clinical Impression(s) / ED Diagnoses Final diagnoses:  Seizure ([place])  Hip pain    Rx / DC Orders ED Discharge Orders         Ordered    divalproex (DEPAKOTE ER) 500 MG 24 hr tablet  Daily     04/22/20 2234    Ambulatory referral to Neurology    Comments: An appointment is requested in approximately:1 week   04/22/20 2235           Charlesetta Shanks, MD 04/22/20 2248

## 2020-04-22 NOTE — ED Notes (Signed)
Pt aware urine sample needed. Provided bedside urinal

## 2020-04-22 NOTE — ED Notes (Signed)
[facility] called for transport. Paperwork at beside. Awaiting DC paperwork from MD.

## 2020-04-22 NOTE — ED Notes (Signed)
Seizure pads placed on bed

## 2020-04-22 NOTE — Discharge Instructions (Signed)
1.  Start taking Depakote as prescribed.  This is your seizure medication.  Make an appointment to see Dr. Lavell Anchors for a follow-up as soon as possible. 2.  You need to be seen by your family doctor and [facility] for further evaluation of your hip and back pain.  Call the number in your discharge instructions to schedule a follow-up appointment. 3.  Take extra strength Tylenol per package instructions for back and hip pain.  You may also use over-the-counter pain patches.  Return to the emergency department if you develop weakness or numbness of your leg, loss of control of your bladder or other worsening and concerning symptoms.

## 2020-04-26 ENCOUNTER — Other Ambulatory Visit: Payer: Self-pay

## 2020-04-26 ENCOUNTER — Encounter: Payer: Self-pay | Admitting: Podiatrist

## 2020-04-26 ENCOUNTER — Ambulatory Visit (INDEPENDENT_AMBULATORY_CARE_PROVIDER_SITE_OTHER): Payer: Medicare Other | Admitting: Podiatrist

## 2020-04-26 DIAGNOSIS — M79609 Pain in unspecified limb: Secondary | ICD-10-CM

## 2020-04-26 DIAGNOSIS — M79676 Pain in unspecified toe(s): Secondary | ICD-10-CM

## 2020-04-26 DIAGNOSIS — B351 Tinea unguium: Secondary | ICD-10-CM

## 2020-04-26 NOTE — Patient Instructions (Signed)

## 2020-04-28 NOTE — Progress Notes (Signed)
  Chief Complaint  Patient presents with  . Nail Problem    pt is here for a nail trim, pt is also concerned about long and thick toenails as well.     HPI: Patient is 74 y.o. male who presents today for the concerns as listed above.    Review of Systems No fevers, chills, nausea, muscle aches, no difficulty breathing, no calf pain, no chest pain or shortness of breath.   Physical Exam  GENERAL APPEARANCE: Alert, conversant. Appropriately groomed. No acute distress.   VASCULAR: Pedal pulses palpable DP and PT bilateral.  Capillary refill time is immediate to all digits,  Proximal to distal cooling it warm to warm.  Digital hair growth is present bilateral   NEUROLOGIC: sensation is intact epicritically and protectively to 5.07 monofilament at 5/5 sites bilateral.  Light touch is intact bilateral, vibratory sensation intact bilateral, achilles tendon reflex is intact bilateral.   MUSCULOSKELETAL: acceptable muscle strength, tone and stability bilateral.  No gross boney pedal deformities noted.  No pain, crepitus or limitation noted with foot and ankle range of motion bilateral.   DERMATOLOGIC: skin is warm, supple, and dry.  Generalized xerosis of bilateral feet and lower legs is noted.  Patient's digital nails are significantly thickened, discolored, dystrophic, incurvated and discolored.  They are painful with palpation and debridement.    Assessment     ICD-10-CM   1. Pain due to onychomycosis of nail  B35.1    M79.609      Plan  Debridement of toenails was recommended.  Onychoreduction of symptomatic toenails was performed via nail nipper and power burr without iatrogenic incident.  Patient was instructed on signs and symptoms of infection and was told to call immediately should any of these arise.

## 2020-05-19 ENCOUNTER — Telehealth: Payer: Self-pay | Admitting: Neurology

## 2020-05-19 NOTE — Telephone Encounter (Signed)
Yes that is fine we have seen him before for seizures so he can go to NP thanks

## 2020-05-19 NOTE — Telephone Encounter (Signed)
He has a one year follow up with Amy already scheduled for 06/10/20 at 2:00 pm. Wonder if this would be acceptable since he has been seen for seizures here before?

## 2020-05-19 NOTE — Telephone Encounter (Signed)
Dr. Jaynee Eagles pt is being referred urgently back to you for Seizures. Could you please review the patients chart and advise when patient can be seen?  Thank you

## 2020-05-19 NOTE — Telephone Encounter (Signed)
Noted TY

## 2020-05-29 ENCOUNTER — Encounter (HOSPITAL_COMMUNITY): Payer: Self-pay

## 2020-05-29 ENCOUNTER — Emergency Department (HOSPITAL_COMMUNITY): Payer: Medicare Other

## 2020-05-29 ENCOUNTER — Other Ambulatory Visit: Payer: Self-pay

## 2020-05-29 ENCOUNTER — Emergency Department (HOSPITAL_COMMUNITY)
Admission: EM | Admit: 2020-05-29 | Discharge: 2020-05-30 | Disposition: A | Discharge status: Home / Self Care | Payer: Medicare Other | Attending: Emergency Medicine | Admitting: Emergency Medicine

## 2020-05-29 DIAGNOSIS — E11649 Type 2 diabetes mellitus with hypoglycemia without coma: Secondary | ICD-10-CM | POA: Insufficient documentation

## 2020-05-29 DIAGNOSIS — I10 Essential (primary) hypertension: Secondary | ICD-10-CM | POA: Insufficient documentation

## 2020-05-29 DIAGNOSIS — Z79899 Other long term (current) drug therapy: Secondary | ICD-10-CM | POA: Insufficient documentation

## 2020-05-29 DIAGNOSIS — Z8546 Personal history of malignant neoplasm of prostate: Secondary | ICD-10-CM | POA: Diagnosis not present

## 2020-05-29 DIAGNOSIS — W19XXXA Unspecified fall, initial encounter: Secondary | ICD-10-CM

## 2020-05-29 DIAGNOSIS — Y92019 Unspecified place in single-family (private) house as the place of occurrence of the external cause: Secondary | ICD-10-CM | POA: Diagnosis not present

## 2020-05-29 DIAGNOSIS — S93401A Sprain of unspecified ligament of right ankle, initial encounter: Secondary | ICD-10-CM

## 2020-05-29 DIAGNOSIS — E162 Hypoglycemia, unspecified: Secondary | ICD-10-CM

## 2020-05-29 DIAGNOSIS — Z87891 Personal history of nicotine dependence: Secondary | ICD-10-CM | POA: Diagnosis not present

## 2020-05-29 DIAGNOSIS — Y939 Activity, unspecified: Secondary | ICD-10-CM | POA: Insufficient documentation

## 2020-05-29 DIAGNOSIS — Y999 Unspecified external cause status: Secondary | ICD-10-CM | POA: Diagnosis not present

## 2020-05-29 DIAGNOSIS — S0990XA Unspecified injury of head, initial encounter: Secondary | ICD-10-CM | POA: Diagnosis present

## 2020-05-29 DIAGNOSIS — S92404A Nondisplaced unspecified fracture of right great toe, initial encounter for closed fracture: Secondary | ICD-10-CM

## 2020-05-29 LAB — COMPREHENSIVE METABOLIC PANEL
ALT: 14 U/L (ref 0–44)
AST: 25 U/L (ref 15–41)
Albumin: 3.9 g/dL (ref 3.5–5.0)
Alkaline Phosphatase: 107 U/L (ref 38–126)
Anion gap: 12 (ref 5–15)
BUN: 21 mg/dL (ref 8–23)
CO2: 26 mmol/L (ref 22–32)
Calcium: 8.8 mg/dL — ABNORMAL LOW (ref 8.9–10.3)
Chloride: 104 mmol/L (ref 98–111)
Creatinine, Ser: 0.78 mg/dL (ref 0.61–1.24)
GFR calc Af Amer: >60 mL/min (ref >60)
GFR calc non Af Amer: >60 mL/min (ref >60)
Glucose, Bld: 63 mg/dL — ABNORMAL LOW (ref 70–99)
Potassium: 3.6 mmol/L (ref 3.5–5.1)
Sodium: 142 mmol/L (ref 135–145)
Total Bilirubin: 1 mg/dL (ref 0.3–1.2)
Total Protein: 7.3 g/dL (ref 6.5–8.1)

## 2020-05-29 LAB — VALPROIC ACID LEVEL: Valproic Acid Lvl: 40 ug/mL — ABNORMAL LOW (ref 50.0–100.0)

## 2020-05-29 LAB — CBC WITH DIFFERENTIAL/PLATELET
Abs Immature Granulocytes: 0.01 10*3/uL (ref 0.00–0.07)
Basophils Absolute: 0 10*3/uL (ref 0.0–0.1)
Basophils Relative: 0 %
Eosinophils Absolute: 0 10*3/uL (ref 0.0–0.5)
Eosinophils Relative: 0 %
HCT: 34.6 % — ABNORMAL LOW (ref 39.0–52.0)
Hemoglobin: 10.6 g/dL — ABNORMAL LOW (ref 13.0–17.0)
Immature Granulocytes: 0 %
Lymphocytes Relative: 21 %
Lymphs Abs: 1.1 10*3/uL (ref 0.7–4.0)
MCH: 30.1 pg (ref 26.0–34.0)
MCHC: 30.6 g/dL (ref 30.0–36.0)
MCV: 98.3 fL (ref 80.0–100.0)
Monocytes Absolute: 0.3 10*3/uL (ref 0.1–1.0)
Monocytes Relative: 7 %
Neutro Abs: 3.6 10*3/uL (ref 1.7–7.7)
Neutrophils Relative %: 72 %
Platelets: 159 10*3/uL (ref 150–400)
RBC: 3.52 MIL/uL — ABNORMAL LOW (ref 4.22–5.81)
RDW: 12.8 % (ref 11.5–15.5)
WBC: 5.1 10*3/uL (ref 4.0–10.5)
nRBC: 0 % (ref 0.0–0.2)

## 2020-05-29 LAB — CBG MONITORING, ED: Glucose-Capillary: 90 mg/dL (ref 70–99)

## 2020-05-29 NOTE — Discharge Instructions (Signed)
Work-up here today without any acute findings.  No evidence of fracture to the right ankle but there is probably likely an ankle sprain.  There is a fracture to the right great toe.  Keep the cam walker on at all times except for when bathing.  Make an appointment to follow-up with orthopedics.  Make an appointment to follow-up with your primary care doctor to have your blood pressures rechecked.  You will need to call for the follow-up with orthopedics.  Return for any new or worse symptoms.  Also recommend to have your blood sugar rechecked by your primary care doctor.

## 2020-05-29 NOTE — ED Provider Notes (Signed)
[facility] DEPT Provider Note   CSN: [id number] Arrival date & time: 05/29/20  1411     History Chief Complaint  Patient presents with  . Weakness    Brad [name] is a 74 y.o. male.  Patient brought in by EMS.  Patient's had multiple falls recently.  Patient had a fall last night denied hitting his head at that time.  But he said he had a fall about a week ago where he did hit the back of his head.  There has not been any loss of consciousness with any of the falls.  Patient was brought in from home.  Patient's sister called out due to failure to thrive.  Patient becoming weaker concerns over patient falling.  Family wanted him evaluated.  Patient denies any complaints other than pain to his right ankle and right great toe.  Also with some pain to his hip area.  Patient alert and cooperative.  Able to answer questions.        Past Medical History:  Diagnosis Date  . Anemia   . Arthritis   . Cancer [facility]) 2017   prostate  . Depression   . Diabetes mellitus without complication ([place])   . History of stomach ulcers   . Hypertension   . Seizures ([place])   . Substance abuse [facility])     Patient Active Problem List   Diagnosis Date Noted  . Cervical spinal stenosis 12/14/2017  . Abnormal CT scan, neck 12/14/2017  . History of stroke 12/12/2017  . Hyperlipidemia 12/12/2017  . Seizure disorder ([place]) 12/12/2017  . KNEE PAIN, RIGHT 06/11/2009  . NECK PAIN 06/11/2009  . Hypertension 02/13/2008  . DENTAL CARIES 02/11/2008    Past Surgical History:  Procedure Laterality Date  . herniated disc    . KNEE SURGERY Right        Family History  Problem Relation Age of Onset  . Seizures Neg Hx     Social History   Tobacco Use  . Smoking status: Former Smoker    Types: Cigarettes  . Smokeless tobacco: Never Used  . Tobacco comment: occasional- patient smokes some marijauna from time to time  Vaping Use  . Vaping Use: Never used  Substance  Use Topics  . Alcohol use: Yes    Alcohol/week: 2.0 - 3.0 standard drinks    Types: 2 - 3 Cans of beer per week    Comment: occasional  . Drug use: Yes    Types: Marijuana    Comment: occasional    Home Medications Prior to Admission medications   Medication Sig Start Date End Date Taking? Authorizing Provider  acetaminophen (TYLENOL) 500 MG tablet Take 1,000 mg by mouth every 6 (six) hours as needed for moderate pain.   Yes [provider]  amLODipine (NORVASC) 2.5 MG tablet Take 1 tablet (2.5 mg total) by mouth daily. 04/19/18  Yes Tereasa Coop, PA-C  atorvastatin (LIPITOR) 20 MG tablet Take 1 tablet (20 mg total) by mouth daily. 04/19/18  Yes Tereasa Coop, PA-C  clotrimazole (LOTRIMIN) 1 % cream Apply 1 application topically daily.  05/04/20  Yes [provider]  diclofenac Sodium (VOLTAREN) 1 % GEL Apply 2 g topically 4 (four) times daily as needed (pain).  12/31/19  Yes [provider]  divalproex (DEPAKOTE ER) 500 MG 24 hr tablet Take 1 tablet (500 mg total) by mouth daily. Pt needs to call [phone] to schedule an appt for continued refills. Patient taking differently:  Take 500 mg by mouth daily.  05/22/18  Yes Melvenia Beam, MD  FLUoxetine (PROZAC) 20 MG capsule Take 2 capsules (40 mg total) by mouth daily. Patient taking differently: Take 20 mg by mouth daily.  04/19/18  Yes Tereasa Coop, PA-C  meloxicam (MOBIC) 15 MG tablet Take 15 mg by mouth daily. 12/31/19  Yes [provider]  mirtazapine (REMERON) 15 MG tablet Take 15 mg by mouth at bedtime. 10/30/19  Yes [provider]  pantoprazole (PROTONIX) 40 MG tablet Take 1 tablet (40 mg total) by mouth daily. 04/26/18  Yes Tereasa Coop, PA-C  tamsulosin (FLOMAX) 0.4 MG CAPS capsule Take 0.4 mg by mouth daily. 05/03/20  Yes [provider]  divalproex (DEPAKOTE ER) 500 MG 24 hr tablet Take 1 tablet (500 mg total) by mouth daily. Patient not taking: Reported on 05/29/2020  04/22/20   Charlesetta Shanks, MD    Allergies    Patient has no known allergies.  Review of Systems   Review of Systems  Constitutional: Negative for chills and fever.  HENT: Negative for congestion, rhinorrhea and sore throat.   Eyes: Negative for visual disturbance.  Respiratory: Negative for cough and shortness of breath.   Cardiovascular: Negative for chest pain and leg swelling.  Gastrointestinal: Negative for abdominal pain, diarrhea, nausea and vomiting.  Genitourinary: Negative for dysuria.  Musculoskeletal: Positive for joint swelling. Negative for back pain and neck pain.  Skin: Negative for rash.  Neurological: Negative for dizziness, light-headedness and headaches.  Hematological: Does not bruise/bleed easily.  Psychiatric/Behavioral: Negative for confusion.    Physical Exam Updated Vital Signs BP (!) 184/76   Pulse 63   Temp 97.7 F (36.5 C) (Oral)   Resp 15   Ht 1.854 m (6\' 1" )   Wt 70.3 kg   SpO2 100%   BMI 20.45 kg/m   Physical Exam Vitals and nursing note reviewed.  Constitutional:      Appearance: Normal appearance. He is well-developed.  HENT:     Head: Normocephalic and atraumatic.  Eyes:     Conjunctiva/sclera: Conjunctivae normal.     Pupils: Pupils are equal, round, and reactive to light.  Cardiovascular:     Rate and Rhythm: Normal rate and regular rhythm.     Heart sounds: No murmur heard.   Pulmonary:     Effort: Pulmonary effort is normal. No respiratory distress.     Breath sounds: Normal breath sounds.  Abdominal:     Palpations: Abdomen is soft.     Tenderness: There is no abdominal tenderness.  Musculoskeletal:        General: Tenderness present. Normal range of motion.     Cervical back: Normal range of motion and neck supple. No tenderness.     Comments: Some tenderness and swelling to the right ankle laterally.  Good cap refill to toes.  Right great toe with some bruising.  And tenderness to palpation.  No proximal leg tenderness.   Some questionable left hip discomfort with range of motion.  No tenderness to the thoracic or lumbar spine.  No posterior tenderness to cervical spine.  Skin:    General: Skin is warm and dry.  Neurological:     General: No focal deficit present.     Mental Status: He is alert and oriented to person, place, and time.     ED Results / Procedures / Treatments   Labs (all labs ordered are listed, but only abnormal results are displayed) Labs Reviewed  CBC  WITH DIFFERENTIAL/PLATELET - Abnormal; Notable for the following components:      Result Value   RBC 3.52 (*)    Hemoglobin 10.6 (*)    HCT 34.6 (*)    All other components within normal limits  COMPREHENSIVE METABOLIC PANEL - Abnormal; Notable for the following components:   Glucose, Bld 63 (*)    Calcium 8.8 (*)    All other components within normal limits  URINALYSIS, ROUTINE W REFLEX MICROSCOPIC    EKG EKG Interpretation  Date/Time:  Saturday May 29 2020 16:16:26 EDT Ventricular Rate:  58 PR Interval:    QRS Duration: 91 QT Interval:  460 QTC Calculation: 452 R Axis:   13 Text Interpretation: Sinus rhythm Atrial premature complexes in couplets Probable left atrial enlargement Abnormal R-wave progression, early transition ST elevation, consider inferior injury Baseline wander in lead(s) V4 Confirmed by Fredia Sorrow [phone]) on 05/29/2020 4:40:58 PM    ED ECG REPORT   Date: 05/29/2020  Rate: 58  Rhythm: premature atrial contractions (PAC)  QRS Axis: normal  Intervals: normal  ST/T Wave abnormalities: ST elevations inferiorly  Conduction Disutrbances:none  Narrative Interpretation:   Old EKG Reviewed: none available EKG has baseline wandering particularly in lead four. There is artifact as well. Making it difficult to ascertain the inferior ST segment elevation but does not appear to represent acute infarct. Recent old EKGs are not opening up.   I have personally reviewed the EKG tracing and agree with the  computerized printout as noted.   Radiology DG Chest 2 View  Result Date: 05/29/2020 CLINICAL DATA:  Multiple falls recently. EXAM: CHEST - 2 VIEW COMPARISON:  Chest x-ray dated July 21, 2008. FINDINGS: The heart size and mediastinal contours are within normal limits. Both lungs are clear. The visualized skeletal structures are unremarkable. IMPRESSION: No active cardiopulmonary disease. Electronically Signed   By: Titus Dubin M.D.   On: 05/29/2020 16:27   DG Ankle Complete Right  Result Date: 05/29/2020 CLINICAL DATA:  Multiple falls recently.  Lateral ankle swelling. EXAM: RIGHT ANKLE - COMPLETE 3+ VIEW COMPARISON:  None. FINDINGS: No acute fracture or dislocation. The ankle mortise is symmetric. The talar dome is intact. No tibiotalar joint effusion. Joint spaces are preserved. Osteopenia. Vascular calcifications. IMPRESSION: 1. No acute osseous abnormality. Electronically Signed   By: Titus Dubin M.D.   On: 05/29/2020 16:31   CT Head Wo Contrast  Result Date: 05/29/2020 CLINICAL DATA:  Multiple falls recently. EXAM: CT HEAD WITHOUT CONTRAST TECHNIQUE: Contiguous axial images were obtained from the base of the skull through the vertex without intravenous contrast. COMPARISON:  CTA head and neck dated November 24, 2017. MRI brain dated October 24, 2017. FINDINGS: Brain: No evidence of acute infarction, hemorrhage, hydrocephalus, extra-axial collection or mass lesion/mass effect. Unchanged moderate atrophy with ventriculomegaly. Stable chronic microvascular ischemic changes and old lacunar infarcts in both basal ganglia and the right thalamus. Vascular: No hyperdense vessel or unexpected calcification. Skull: Normal. Negative for fracture or focal lesion. Sinuses/Orbits: No acute finding. Other: None. IMPRESSION: 1. No acute intracranial abnormality. 2. Stable atrophy and chronic microvascular ischemic changes. Electronically Signed   By: Titus Dubin M.D.   On: 05/29/2020 16:20   DG Hips  Bilat W or Wo Pelvis 3-4 Views  Result Date: 05/29/2020 CLINICAL DATA:  Multiple falls recently. EXAM: DG HIP (WITH OR WITHOUT PELVIS) 3-4V BILAT COMPARISON:  Pelvis and left hip x-rays dated Apr 22, 2020. FINDINGS: No acute fracture or dislocation. Mild left greater than right hip  osteoarthritis. The pubic symphysis and sacroiliac joints are intact. IMPRESSION: 1.  No acute osseous abnormality. Electronically Signed   By: Titus Dubin M.D.   On: 05/29/2020 16:30    Procedures Procedures (including critical care time)  Medications Ordered in ED Medications - No data to display  ED Course  I have reviewed the triage vital signs and the nursing notes.  Pertinent labs & imaging results that were available during my care of the patient were reviewed by me and considered in my medical decision making (see chart for details).    MDM Rules/Calculators/A&P                         Patient urinating here.  But keeps urinating and were not able to get a sample.  Work-up which was extensive without acute findings.  Included x-rays of hip and pelvis the right ankle which shows no evidence of fracture.  There is evidence of a fracture of the right great toe.  Head CT without any acute findings.  Chest x-ray negative.  Patient nontoxic no acute distress.  No leukocytosis.  No significant anemia.  Initially patient's blood sugar was a little low.  Patient was fed here.  Blood sugar stabilized out and is now normal.  No significant electrolyte abnormalities.  Patient's blood pressure here has been consistently high.  So close follow-up with primary care provider for recheck of that will be important.  Patient nontoxic no indication for admission.  Patient stable for discharge home.  Patient followed by [facility].  Patient family could contact them may be for some home health needs.  As needed.  Final Clinical Impression(s) / ED Diagnoses Final diagnoses:  Fall, initial encounter  Hypoglycemia  Sprain of right  ankle, unspecified ligament, initial encounter    Rx / DC Orders ED Discharge Orders    None       Fredia Sorrow, MD 05/29/20 2235

## 2020-05-29 NOTE — ED Triage Notes (Addendum)
Pt states that he has had multiple falls recently. Pt had a fall last night, denies hitting his head or LOC. Pt did state he had a fall apprx one week ago and hit the back of his head, but denies LOC. Pt Right ankle appears to be swollen, and possible deformity.

## 2020-05-29 NOTE — ED Notes (Signed)
Urinal placed between pt's legs and pt given water to help facilitate urinae sample

## 2020-05-29 NOTE — ED Notes (Signed)
Patient is aware that urine sample is needed. Urinal at bedside. 

## 2020-05-29 NOTE — ED Notes (Signed)
[facility] called  

## 2020-05-29 NOTE — ED Triage Notes (Signed)
Per EMS, Pt is coming from home. Pts sister called out due to failure to thrive. Pt becoming weaker, concerns over pt falling. Pt is also becoming incontinent. Family of pt wanted him evaluated.

## 2020-06-10 ENCOUNTER — Encounter: Payer: Self-pay | Admitting: Family Medicine

## 2020-06-10 ENCOUNTER — Telehealth: Payer: Self-pay | Admitting: Family Medicine

## 2020-06-10 ENCOUNTER — Ambulatory Visit (INDEPENDENT_AMBULATORY_CARE_PROVIDER_SITE_OTHER): Payer: Medicare Other | Admitting: Family Medicine

## 2020-06-10 ENCOUNTER — Other Ambulatory Visit: Payer: Self-pay

## 2020-06-10 VITALS — BP 141/74 | HR 75

## 2020-06-10 DIAGNOSIS — R627 Adult failure to thrive: Secondary | ICD-10-CM

## 2020-06-10 DIAGNOSIS — R9389 Abnormal findings on diagnostic imaging of other specified body structures: Secondary | ICD-10-CM

## 2020-06-10 DIAGNOSIS — R413 Other amnesia: Secondary | ICD-10-CM

## 2020-06-10 DIAGNOSIS — R296 Repeated falls: Secondary | ICD-10-CM

## 2020-06-10 DIAGNOSIS — R29898 Other symptoms and signs involving the musculoskeletal system: Secondary | ICD-10-CM

## 2020-06-10 DIAGNOSIS — G40909 Epilepsy, unspecified, not intractable, without status epilepticus: Secondary | ICD-10-CM

## 2020-06-10 MED ORDER — DIVALPROEX SODIUM ER 500 MG PO TB24: 500.0000 mg | ORAL_TABLET | Freq: Every day | ORAL | 3 refills | Status: DC

## 2020-06-10 NOTE — Patient Instructions (Signed)
I will call in Depakote. Please take medication every day and avoid missed doses.   Stay well hydrated. I need you to drink 40-50 ounces of water daily.  I am going to refer you to PT. I am also going to order a scan of your brain. We will call with results once we have those.   Follow up in 3 months, sooner if needed   Fall Prevention in the Home, Adult Falls can cause injuries. They can happen to people of all ages. There are many things you can do to make your home safe and to help prevent falls. Ask for help when making these changes, if needed. What actions can I take to prevent falls? General Instructions  Use good lighting in all rooms. Replace any light bulbs that burn out.  Turn on the lights when you go into a dark area. Use night-lights.  Keep items that you use often in easy-to-reach places. Lower the shelves around your home if necessary.  Set up your furniture so you have a clear path. Avoid moving your furniture around.  Do not have throw rugs and other things on the floor that can make you trip.  Avoid walking on wet floors.  If any of your floors are uneven, fix them.  Add color or contrast paint or tape to clearly mark and help you see: ? Any grab bars or handrails. ? First and last steps of stairways. ? Where the edge of each step is.  If you use a stepladder: ? Make sure that it is fully opened. Do not climb a closed stepladder. ? Make sure that both sides of the stepladder are locked into place. ? Ask someone to hold the stepladder for you while you use it.  If there are any pets around you, be aware of where they are. What can I do in the bathroom?      Keep the floor dry. Clean up any water that spills onto the floor as soon as it happens.  Remove soap buildup in the tub or shower regularly.  Use non-skid mats or decals on the floor of the tub or shower.  Attach bath mats securely with double-sided, non-slip rug tape.  If you need to sit  down in the shower, use a plastic, non-slip stool.  Install grab bars by the toilet and in the tub and shower. Do not use towel bars as grab bars. What can I do in the bedroom?  Make sure that you have a light by your bed that is easy to reach.  Do not use any sheets or blankets that are too big for your bed. They should not hang down onto the floor.  Have a firm chair that has side arms. You can use this for support while you get dressed. What can I do in the kitchen?  Clean up any spills right away.  If you need to reach something above you, use a strong step stool that has a grab bar.  Keep electrical cords out of the way.  Do not use floor polish or wax that makes floors slippery. If you must use wax, use non-skid floor wax. What can I do with my stairs?  Do not leave any items on the stairs.  Make sure that you have a light switch at the top of the stairs and the bottom of the stairs. If you do not have them, ask someone to add them for you.  Make sure that there  are handrails on both sides of the stairs, and use them. Fix handrails that are broken or loose. Make sure that handrails are as long as the stairways.  Install non-slip stair treads on all stairs in your home.  Avoid having throw rugs at the top or bottom of the stairs. If you do have throw rugs, attach them to the floor with carpet tape.  Choose a carpet that does not hide the edge of the steps on the stairway.  Check any carpeting to make sure that it is firmly attached to the stairs. Fix any carpet that is loose or worn. What can I do on the outside of my home?  Use bright outdoor lighting.  Regularly fix the edges of walkways and driveways and fix any cracks.  Remove anything that might make you trip as you walk through a door, such as a raised step or threshold.  Trim any bushes or trees on the path to your home.  Regularly check to see if handrails are loose or broken. Make sure that both sides of any  steps have handrails.  Install guardrails along the edges of any raised decks and porches.  Clear walking paths of anything that might make someone trip, such as tools or rocks.  Have any leaves, snow, or ice cleared regularly.  Use sand or salt on walking paths during winter.  Clean up any spills in your garage right away. This includes grease or oil spills. What other actions can I take?  Wear shoes that: ? Have a low heel. Do not wear high heels. ? Have rubber bottoms. ? Are comfortable and fit you well. ? Are closed at the toe. Do not wear open-toe sandals.  Use tools that help you move around (mobility aids) if they are needed. These include: ? Canes. ? Walkers. ? Scooters. ? Crutches.  Review your medicines with your doctor. Some medicines can make you feel dizzy. This can increase your chance of falling. Ask your doctor what other things you can do to help prevent falls. Where to find more information  Centers for Disease Control and Prevention, [facility]: https://garcia.biz/  [employer] on Aging: BrainJudge.co.uk Contact a doctor if:  You are afraid of falling at home.  You feel weak, drowsy, or dizzy at home.  You fall at home. Summary  There are many simple things that you can do to make your home safe and to help prevent falls.  Ways to make your home safe include removing tripping hazards and installing grab bars in the bathroom.  Ask for help when making these changes in your home. This information is not intended to replace advice given to you by your health care provider. Make sure you discuss any questions you have with your health care provider. Document Revised: 03/27/2019 Document Reviewed: 07/19/2017 Elsevier Patient Education  [employer].   It is important to avoid accidents which may result in broken bones.  Here are a few ideas on how to make your home safer so you will be less likely to trip or fall.  1. Use nonskid mats  or non slip strips in your shower or tub, on your bathroom floor and around sinks.  If you know that you have spilled water, wipe it up! 2. In the bathroom, it is important to have properly installed grab bars on the walls or on the edge of the tub.  Towel racks are NOT strong enough for you to hold onto or to pull on for  support. 3. Stairs and hallways should have enough light.  Add lamps or night lights if you need ore light. 4. It is good to have handrails on both sides of the stairs if possible.  Always fix broken handrails right away. 5. It is important to see the edges of steps.  Paint the edges of outdoor steps white so you can see them better.  Put colored tape on the edge of inside steps. 6. Throw-rugs are dangerous because they can slide.  Removing the rugs is the best idea, but if they must stay, add adhesive carpet tape to prevent slipping. 7. Do not keep things on stairs or in the halls.  Remove small furniture that blocks the halls as it may cause you to trip.  Keep telephone and electrical cords out of the way where you walk. 8. Always were sturdy, rubber-soled shoes for good support.  Never wear just socks, especially on the stairs.  Socks may cause you to slip or fall.  Do not wear full-length housecoats as you can easily trip on the bottom.  9. Place the things you use the most on the shelves that are the easiest to reach.  If you use a stepstool, make sure it is in good condition.  If you feel unsteady, DO NOT climb, ask for help. 10. If a health professional advises you to use a cane or walker, do not be ashamed.  These items can keep you from falling and breaking your bones.  Seizure, Adult A seizure is a sudden burst of abnormal electrical activity in the brain. Seizures usually last from 30 seconds to 2 minutes. They can cause many different symptoms. Usually, seizures are not harmful unless they last a long time. What are the causes? Common causes of this condition  include:  Fever or infection.  Conditions that affect the brain, such as: ? A brain abnormality that you were born with. ? A brain or head injury. ? Bleeding in the brain. ? A tumor. ? Stroke. ? Brain disorders such as autism or cerebral palsy.  Low blood sugar.  Conditions that are passed from parent to child (are inherited).  Problems with substances, such as: ? Having a reaction to a drug or a medicine. ? Suddenly stopping the use of a substance (withdrawal). In some cases, the cause may not be known. A person who has repeated seizures over time without a clear cause has a condition called epilepsy. What increases the risk? You are more likely to get this condition if you have:  A family history of epilepsy.  Had a seizure in the past.  A brain disorder.  A history of head injury, lack of oxygen at birth, or strokes. What are the signs or symptoms? There are many types of seizures. The symptoms vary depending on the type of seizure you have. Examples of symptoms during a seizure include:  Shaking (convulsions).  Stiffness in the body.  Passing out (losing consciousness).  Head nodding.  Staring.  Not responding to sound or touch.  Loss of bladder control and bowel control. Some people have symptoms right before and right after a seizure happens. Symptoms before a seizure may include:  Fear.  Worry (anxiety).  Feeling like you may vomit (nauseous).  Feeling like the room is spinning (vertigo).  Feeling like you saw or heard something before (dj vu).  Odd tastes or smells.  Changes in how you see. You may see flashing lights or spots. Symptoms after a seizure happens  can include:  Confusion.  Sleepiness.  Headache.  Weakness on one side of the body. How is this treated? Most seizures will stop on their own in under 5 minutes. In these cases, no treatment is needed. Seizures that last longer than 5 minutes will usually need treatment.  Treatment can include:  Medicines given through an IV tube.  Avoiding things that are known to cause your seizures. These can include medicines that you take for another condition.  Medicines to treat epilepsy.  Surgery to stop the seizures. This may be needed if medicines do not help. Follow these instructions at home: Medicines  Take over-the-counter and prescription medicines only as told by your doctor.  Do not eat or drink anything that may keep your medicine from working, such as alcohol. Activity  Do not do any activities that would be dangerous if you had another seizure, like driving or swimming. Wait until your doctor says it is safe for you to do them.  If you live in the [place] ask your local DMV (department of motor vehicles) when you can drive.  Get plenty of rest. Teaching others Teach friends and family what to do when you have a seizure. They should:  Lay you on the ground.  Protect your head and body.  Loosen any tight clothing around your neck.  Turn you on your side.  Not hold you down.  Not put anything into your mouth.  Know whether or not you need emergency care.  Stay with you until you are better.  General instructions  Contact your doctor each time you have a seizure.  Avoid anything that gives you seizures.  Keep a seizure diary. Write down: ? What you think caused each seizure. ? What you remember about each seizure.  Keep all follow-up visits as told by your doctor. This is important. Contact a doctor if:  You have another seizure.  You have seizures more often.  There is any change in what happens during your seizures.  You keep having seizures with treatment.  You have symptoms of being sick or having an infection. Get help right away if:  You have a seizure that: ? Lasts longer than 5 minutes. ? Is different than seizures you had before. ? Makes it harder to breathe. ? Happens after you hurt your head.  You have any  of these symptoms after a seizure: ? Not being able to speak. ? Not being able to use a part of your body. ? Confusion. ? A bad headache.  You have two or more seizures in a row.  You do not wake up right after a seizure.  You get hurt during a seizure. These symptoms may be an emergency. Do not wait to see if the symptoms will go away. Get medical help right away. Call your local emergency services (911 in the [place] Do not drive yourself to the hospital. Summary  Seizures usually last from 30 seconds to 2 minutes. Usually, they are not harmful unless they last a long time.  Do not eat or drink anything that may keep your medicine from working, such as alcohol.  Teach friends and family what to do when you have a seizure.  Contact your doctor each time you have a seizure. This information is not intended to replace advice given to you by your health care provider. Make sure you discuss any questions you have with your health care provider. Document Revised: 02/21/2019 Document Reviewed: 02/21/2019 Elsevier Patient Education  2020  [employer].

## 2020-06-10 NOTE — Telephone Encounter (Signed)
[facility] medicare/medicaid order sent to [facility]. No auth they will reach out to the patient to schedule.

## 2020-06-10 NOTE — Progress Notes (Addendum)
PATIENT: Brad [name] DOB: March 20, 1946  REASON FOR VISIT: follow up HISTORY FROM: patient  Chief Complaint  Patient presents with  . Follow-up    Rm 2 here for a f/u on seizures. Pts sister says he is a little slow when responding     HISTORY OF PRESENT ILLNESS: Today 06/10/20 Brad [name] is a 74 y.o. male here today for follow up for seizure.  He was last seen in our office in December 2018.  At that time there were concerns for continued seizure and syncopal episodes.  MRI performed in 2018 showed chronic small vessel ischemia with lacunar infarcts and chronic ventriculomegaly favored to reflect cerebral atrophy.  He was prescribed Depakote ER 500 mg daily.  His sister is here with him today and aids in history.  She states that he had taken this medication fairly consistently but over the past year dosing has been inconsistent.  Last prescription was written in 6 of 2019.  She reports that she will find medication plan by his chair in the living room.  Unfortunately, he did have a seizure in May, 2021.  ER evaluation was unremarkable and he was restarted on Depakote.  He reports that he had taken medication consistently until 3 days ago when he ran out.  He was also seen in June for a fall with head injury.  CT scan was unremarkable.  His sister reports that he has a very difficult time with strength.  She states that over the past year he has been less interested in doing things around the home.  She reports that he is either sitting or lying down for 23 hours a day.  He does have some right-sided knee pain and today in a walking boot due to the fall in June.  She reports that he is eating well.  He states that he may have 2 glasses of some sort of fluid throughout the day.  He does say that he feels sad sometimes but overall does not feel depressed.  He is followed closely by primary care.  He was just seen a couple weeks ago.  There was some concerns of hypoglycemia.  He has had  multiple falls over the past year.  He was previously working with physical therapy in 2019 but his sister states that he refused to continue care.  He is interested in restarting therapy at this time.  He is able to walk around the home with his walker.  He feels that all of his falls are due to feeling off balance.  He denies dizziness with standing.  He lives at home with his sister.  He does not drive.  He does have some memory loss.  HISTORY: (copied from Brad [name] note on 12/12/2017)  UPDATE 12/26/2018CM Brad [name], 74 year old male returns for follow-up with his sister.  He was initially evaluated in October by Brad [name] for seizures and syncope.  MRI of the brain on 11/7/20181. No acute intracranial abnormality. 2. Chronic small vessel ischemic disease with lacunar infarcts as above. Suspected small late subacute infarct in the right corona radiata. 3. Chronic ventriculomegaly favored to reflect cerebral atrophy rather than hydrocephalus. CTA head Anterior circulation: Mild atheromatous plaque on the carotid siphons. No branch occlusion or flow limiting stenosis, beading, or aneurysm. Posterior circulation: Codominant vertebral arteries. Dominant left PICA and right AICA. Small left AICA. Basilar and vertebral arteries are smooth and widely patent. Hypoplastic right and aplastic left P1 segments. No branch occlusion or flow  limiting stenosis. Negative for aneurysm. CTA neck Cervical carotid atherosclerosis with 60% left ICA stenosis. No flow limiting stenosis or definite ulceration on the right. 2. No proximal subclavian or vertebral stenosis. 3. Limited intracranial atherosclerosis without stenosis. 4. Atrophy and extensive chronic small vessel ischemia in the cerebral white matter. 5. Prominent cervical disc degeneration and ligamentum flavum thickening. Spinal stenosis with cord impingement at C4-5 and C5-6. He is currently on aspirin for secondary stroke prevention.  He  has no bruising and no bleeding cholesterol drawn by primary care on 11/01/2017 with LDL 142.  He was placed on Lipitor.  No complaints of myalgias.  Hemoglobin A1c 4.4.  Blood pressure in the office today 152/84.  He is on Depakote for seizure activity and needs level drawn today.  EEG was normal.  No recent seizure activity per sister.  He does not exercise, pretty much sits in the chair all day and watches TV.  He has not had echocardiogram done.  He returns for reevaluation  10/11/17 Brad [name] a 74 y.o.malehere as a referral from Brad [name] syncopeand seizures.Past medical history of substance abuse, seizures, hypertension, diabetes, depression, arthritis, anemia, dementia.He is with his sister and brother in law. Patient says he is sitting around, he feels tired, he "blanks out". He denies remembering while he is blanked out. At least 3 times since July or august. Lasts a few minutes. He loses consciousness and falls. He becomes incoherant, "eyes open but not there" staring into space per brother in law. They help him off the ground and he comes to. Per sister he had "fallen out" in the past and one time he was taken to the local hospital for observation but unclear what the outcome was. She doesn't know, but has seen these staring, incoherant episodes. Last one was Tuesday. Previously 3-4 weeks ago. Then a month beforehand. The first one he was standing on the sidewalk and he just "started going down". No FHx of seizures. Previous drug and alcohol use not anymore. But not drug or alcoholr elated.No other focal neurologic deficits, associated symptoms, inciting events or modifiable factors.   REVIEW OF SYSTEMS: Out of a complete 14 system review of symptoms, the patient complains only of the following symptoms, seizure, right-sided weakness, right knee pain, fatigue, memory loss and all other reviewed systems are negative.  ALLERGIES: No Known Allergies  HOME  MEDICATIONS: Outpatient Medications Prior to Visit  Medication Sig Dispense Refill  . acetaminophen (TYLENOL) 500 MG tablet Take 1,000 mg by mouth every 6 (six) hours as needed for moderate pain.    Marland Kitchen amLODipine (NORVASC) 2.5 MG tablet Take 1 tablet (2.5 mg total) by mouth daily. 90 tablet 1  . atorvastatin (LIPITOR) 20 MG tablet Take 1 tablet (20 mg total) by mouth daily. 90 tablet 1  . clotrimazole (LOTRIMIN) 1 % cream Apply 1 application topically daily.     . diclofenac Sodium (VOLTAREN) 1 % GEL Apply 2 g topically 4 (four) times daily as needed (pain).     Marland Kitchen FLUoxetine (PROZAC) 20 MG capsule Take 2 capsules (40 mg total) by mouth daily. (Patient taking differently: Take 20 mg by mouth daily. ) 180 capsule 1  . meloxicam (MOBIC) 15 MG tablet Take 15 mg by mouth daily.    . mirtazapine (REMERON) 15 MG tablet Take 15 mg by mouth at bedtime.    . tamsulosin (FLOMAX) 0.4 MG CAPS capsule Take 0.4 mg by mouth daily.    . divalproex (DEPAKOTE ER) 500 MG  24 hr tablet Take 1 tablet (500 mg total) by mouth daily. Pt needs to call [phone] to schedule an appt for continued refills. (Patient taking differently: Take 500 mg by mouth daily. ) 90 tablet 0  . divalproex (DEPAKOTE ER) 500 MG 24 hr tablet Take 1 tablet (500 mg total) by mouth daily. 30 tablet 1  . pantoprazole (PROTONIX) 40 MG tablet Take 1 tablet (40 mg total) by mouth daily. 14 tablet 0   No facility-administered medications prior to visit.    PAST MEDICAL HISTORY: Past Medical History:  Diagnosis Date  . Anemia   . Arthritis   . Cancer [facility]) 2017   prostate  . Depression   . Diabetes mellitus without complication ([place])   . History of stomach ulcers   . Hypertension   . Seizures ([place])   . Substance abuse ([place])     PAST SURGICAL HISTORY: Past Surgical History:  Procedure Laterality Date  . herniated disc    . KNEE SURGERY Right     FAMILY HISTORY: Family History  Problem Relation Age of Onset  . Seizures Neg Hx      SOCIAL HISTORY: Social History   Socioeconomic History  . Marital status: Single    Spouse name: Not on file  . Number of children: Not on file  . Years of education: Not on file  . Highest education level: High school graduate  Occupational History  . Occupation: retired  Tobacco Use  . Smoking status: Former Smoker    Types: Cigarettes  . Smokeless tobacco: Never Used  . Tobacco comment: occasional- patient smokes some marijauna from time to time  Vaping Use  . Vaping Use: Never used  Substance and Sexual Activity  . Alcohol use: Yes    Alcohol/week: 2.0 - 3.0 standard drinks    Types: 2 - 3 Cans of beer per week    Comment: occasional  . Drug use: Yes    Types: Marijuana    Comment: occasional  . Sexual activity: Not on file  Other Topics Concern  . Not on file  Social History Narrative  . Not on file   Social Determinants of Health   Financial Resource Strain:   . Difficulty of Paying Living Expenses:   Food Insecurity:   . Worried About Charity fundraiser in the Last Year:   . Arboriculturist in the Last Year:   Transportation Needs:   . Film/video editor (Medical):   Marland Kitchen Lack of Transportation (Non-Medical):   Physical Activity:   . Days of Exercise per Week:   . Minutes of Exercise per Session:   Stress:   . Feeling of Stress :   Social Connections:   . Frequency of Communication with Friends and Family:   . Frequency of Social Gatherings with Friends and Family:   . Attends Religious Services:   . Active Member of Clubs or Organizations:   . Attends Archivist Meetings:   Marland Kitchen Marital Status:   Intimate Partner Violence:   . Fear of Current or Ex-Partner:   . Emotionally Abused:   Marland Kitchen Physically Abused:   . Sexually Abused:       PHYSICAL EXAM  Vitals:   06/10/20 1320  BP: (!) 141/74  Pulse: 75   There is no height or weight on file to calculate BMI.  Generalized: Well developed, in no acute distress  Cardiology: normal  rate and rhythm, no murmur noted Respiratory: clear to auscultation bilaterally  Neurological examination  Mentation: Alert oriented to month and year, not oriented to date, place, history taking. Follows all commands speech and language fluent Cranial nerve II-XII: Pupils were equal round reactive to light. Extraocular movements were full, visual field were full on confrontational test. Facial sensation and strength were normal. Uvula tongue midline. Head turning and shoulder shrug  were normal and symmetric. Motor: The motor testing reveals 5 over 5 strength of bilateral upper and left lower extremity, 3+ right hip flexion, 4/5 right extension and flexion. Sensory: Sensory testing is intact to soft touch on all 4 extremities. No evidence of extinction is noted.  Coordination: Cerebellar testing reveals good finger-nose-finger bilaterally and of left lower extremity, reduced on the right lower extremity Gait and station: Not assessed today as patient is in a wheelchair due to weakness and has a walking boot placed on the right lower extremity. Reflexes: Deep tendon reflexes are symmetric and normal bilaterally.   DIAGNOSTIC DATA (LABS, IMAGING, TESTING) - I reviewed patient records, labs, notes, testing and imaging myself where available.  No flowsheet data found.   Lab Results  Component Value Date   WBC 5.1 05/29/2020   HGB 10.6 (L) 05/29/2020   HCT 34.6 (L) 05/29/2020   MCV 98.3 05/29/2020   PLT 159 05/29/2020      Component Value Date/Time   NA 142 05/29/2020 1618   NA 142 04/19/2018 1629   K 3.6 05/29/2020 1618   CL 104 05/29/2020 1618   CO2 26 05/29/2020 1618   GLUCOSE 63 (L) 05/29/2020 1618   BUN 21 05/29/2020 1618   BUN 21 04/19/2018 1629   CREATININE 0.78 05/29/2020 1618   CALCIUM 8.8 (L) 05/29/2020 1618   PROT 7.3 05/29/2020 1618   PROT 6.9 04/19/2018 1629   ALBUMIN 3.9 05/29/2020 1618   ALBUMIN 4.2 04/19/2018 1629   AST 25 05/29/2020 1618   ALT 14 05/29/2020 1618    ALKPHOS 107 05/29/2020 1618   BILITOT 1.0 05/29/2020 1618   BILITOT 0.5 04/19/2018 1629   GFRNONAA >60 05/29/2020 1618   GFRAA >60 05/29/2020 1618   Lab Results  Component Value Date   CHOL 109 04/19/2018   HDL 44 04/19/2018   LDLCALC 55 04/19/2018   TRIG 49 04/19/2018   CHOLHDL 2.5 04/19/2018   Lab Results  Component Value Date   HGBA1C 4.6 (L) 04/19/2018   Lab Results  Component Value Date   VITAMINB12 920 (H) 06/10/2008   Lab Results  Component Value Date   TSH 3.610 07/13/2017       ASSESSMENT AND PLAN 74 y.o. year old male  has a past medical history of Anemia, Arthritis, Cancer ([place]) (2017), Depression, Diabetes mellitus without complication ([place]), History of stomach ulcers, Hypertension, Seizures ([place]), and Substance abuse ([place]). here with     ICD-10-CM   1. Seizure disorder ([place])  G40.909 MR BRAIN W WO CONTRAST  2. Right leg weakness  R29.898 Ambulatory referral to Physical Therapy    MR BRAIN W WO CONTRAST  3. Failure to thrive in adult  R62.7 Ambulatory referral to Physical Therapy  4. Multiple falls  R29.6   5. Memory loss  R41.3   6. Abnormal MRI  R93.89     Smitty presents today with his sister with breakthrough seizure in the setting of noncompliance and concerns of weakness, specifically of the right leg.  He has had increased memory loss and loss of interest over the past 12 to 18 months.  I have discussed recent ER  evaluations and current concerns with he and his sister.  I have educated them on the importance of medication compliance to prevent breakthrough seizures.  We will continue Depakote ER 500 mg daily.  He was encouraged to take this medication every day and avoid missed doses.  I have also discussed concerns of right-sided weakness.  I do feel that this may be related to pain as he is reporting significant right-sided knee pain today from a recent fall.  He has a walking boot placed on the right foot.  Due to the concerns mentioned in HPI and  history of abnormal MRI, I would like to repeat imaging.  Orders for MRI with and without place today.  He does state that he is willing to participate in physical therapy.  I feel that this would be very helpful for strengthening.  His sister asked that physical therapy be performed at home as they have patient concerns.  We have discussed safety concerns in detail.  Seizure and fall precautions reviewed.  Additional information provided in AVS.  I have encouraged him to continue very close follow-up with primary care.  We have discussed healthy lifestyle habits including well-balanced diet and regular exercise.  I have asked that he try to drink 40 to 50 ounces of water daily.  I will have him return to see [place] in 3 months, sooner if needed.  He and his sister both verbalized understanding and agreement with this plan.   Orders Placed This Encounter  Procedures  . MR BRAIN W WO CONTRAST    Standing Status:   Future    Standing Expiration Date:   06/10/2021    Order Specific Question:   If indicated for the ordered procedure, I authorize the administration of contrast media per Radiology protocol    Answer:   Yes    Order Specific Question:   What is the patient's sedation requirement?    Answer:   No Sedation    Order Specific Question:   Does the patient have a pacemaker or implanted devices?    Answer:   No    Order Specific Question:   Radiology Contrast Protocol - do NOT remove file path    Answer:   [email or web address]    Order Specific Question:   Preferred imaging location?    Answer:   External  . Ambulatory referral to Physical Therapy    Referral Priority:   Routine    Referral Type:   Physical Medicine    Referral Reason:   Specialty Services Required    Requested Specialty:   Physical Therapy    Number of Visits Requested:   1     Meds ordered this encounter  Medications  . divalproex (DEPAKOTE ER) 500 MG 24 hr tablet    Sig: Take 1 tablet (500 mg  total) by mouth daily.    Dispense:  90 tablet    Refill:  3    Pt needs to call [phone] to schedule an appt for continued refills.    Order Specific Question:   Supervising Provider    Answer:   Melvenia Beam [[id number]]      I spent 45 minutes with the patient. 50% of this time was spent counseling and educating patient on plan of care and medications.    Debbora Presto, FNP-C 06/10/2020, 2:31 PM [facility] [address], [place], [place] [address] [phone]   Made any corrections needed, and agree with history, physical, neuro exam,assessment and  plan as stated.     Sarina Ill, MD [facility]

## 2020-07-06 ENCOUNTER — Other Ambulatory Visit: Payer: Medicare Other

## 2020-09-12 NOTE — Progress Notes (Deleted)
[facility]    Provider:  [name] Referring Provider: [name], DO Primary Care Physician:  [name], DO  CC:  Syncope and seizures  This is a patient is here for follow-up for seizure, he was initially seen in our office in December 2018, last visit was with NP [name] approximately 3 months ago June 10, 2020, to summarize when he was seen in 2018 there were concerns for continued seizure and syncopal episodes, MRI 2018 showed chronic small vessel ischemia with lacunar infarcts and chronic ventriculomegaly favored to reflect cerebral atrophy, he was prescribed Depakote extended release 500 mg daily, he had taken his medication fairly consistently but over the past year dosing had been inconsistent, last prescription was written in June 2019, sister would find medication by his chair in the living room, he did have a seizure in May 2021, he was restarted on Depakote, when he was seen by Debbora Presto he reported he had taken the medication consistently until 3 days prior when he ran out, he was also seen in June for a fall with head injury, CT scan was negative for acute abnormality, having a very difficult time with strength, over the past year has been less interested in doing things around the home, not interested in doing things, adjusting at home all day, decrease fluid intake but eating well despite some concerns for hypoglycemia he stated he was eating well, multiple falls over the past year, he refused to continue care with physical therapy in 2019, [name] restarted therapy, he stated imbalance.  [name] gave him refills of the Depakote, she discussed medication compliance, his weakness may be related to pain due to right-sided knee pain from a recent fall, she repeated imaging, ordered physical therapy at home and they discussed safety concerns.  HPI 11/2017:  Brad [name] is a 74 y.o. male here as a referral from Dr. Posey Pronto for syncope and seizures. Past  medical history of substance abuse, seizures, hypertension, diabetes, depression, arthritis, anemia, dementia. He is with his sister and brother in law. Patient says he is sitting around, he feels tired, he "blanks out". He denies remembering while he is blanked out. At least 3 times since July or august. Lasts a few minutes. He loses consciousness and falls. He becomes incoherant, "eyes open but not there" staring into space per brother in law. They help him off the ground and he comes to. Per sister he had "fallen out" in the past and one time he was taken to the local hospital for observation but unclear what the outcome was. She doesn't know, but has seen these staring, incoherant episodes. Last one was Tuesday. Previously 3-4 weeks ago. Then a month beforehand. The first one he was standing on the sidewalk and he just "started going down". No FHx of seizures. Previous drug and alcohol use not anymore. But not drug or alcoholr elated. No other focal neurologic deficits, associated symptoms, inciting events or modifiable factors.  Reviewed notes, labs and imaging from outside physicians, which showed:  Personally reviewed CT of the head images 10/10/2017 and agree with the following:  1. No evidence of acute intracranial abnormality 2. Atrophy, chronic small-vessel white matter ischemic changes and remote basal ganglia infarcts.  TSH July 2018 was normal 3.61 CBC 10/10/2017 showed anemia hemoglobin 12.4, BMP unremarkable.   Review of Systems: Patient complains of symptoms per HPI as well as the following symptoms memory loss, confusion, numbness, dizziness, seizure, passing out, depression, too much sleep,  disinterest in activities. Pertinent negatives and positives per HPI. All others negative.   Social History   Socioeconomic History  . Marital status: Single    Spouse name: Not on file  . Number of children: Not on file  . Years of education: Not on file  . Highest education level:  High school graduate  Occupational History  . Occupation: retired  Tobacco Use  . Smoking status: Former Smoker    Types: Cigarettes  . Smokeless tobacco: Never Used  . Tobacco comment: occasional- patient smokes some marijauna from time to time  Vaping Use  . Vaping Use: Never used  Substance and Sexual Activity  . Alcohol use: Yes    Alcohol/week: 2.0 - 3.0 standard drinks    Types: 2 - 3 Cans of beer per week    Comment: occasional  . Drug use: Yes    Types: Marijuana    Comment: occasional  . Sexual activity: Not on file  Other Topics Concern  . Not on file  Social History Narrative  . Not on file   Social Determinants of Health   Financial Resource Strain:   . Difficulty of Paying Living Expenses: Not on file  Food Insecurity:   . Worried About Charity fundraiser in the Last Year: Not on file  . Ran Out of Food in the Last Year: Not on file  Transportation Needs:   . Lack of Transportation (Medical): Not on file  . Lack of Transportation (Non-Medical): Not on file  Physical Activity:   . Days of Exercise per Week: Not on file  . Minutes of Exercise per Session: Not on file  Stress:   . Feeling of Stress : Not on file  Social Connections:   . Frequency of Communication with Friends and Family: Not on file  . Frequency of Social Gatherings with Friends and Family: Not on file  . Attends Religious Services: Not on file  . Active Member of Clubs or Organizations: Not on file  . Attends Archivist Meetings: Not on file  . Marital Status: Not on file  Intimate Partner Violence:   . Fear of Current or Ex-Partner: Not on file  . Emotionally Abused: Not on file  . Physically Abused: Not on file  . Sexually Abused: Not on file    Family History  Problem Relation Age of Onset  . Seizures Neg Hx     Past Medical History:  Diagnosis Date  . Anemia   . Arthritis   . Cancer [facility]) 2017   prostate  . Depression   . Diabetes mellitus without complication  ([place])   . History of stomach ulcers   . Hypertension   . Seizures ([place])   . Substance abuse [facility])     Past Surgical History:  Procedure Laterality Date  . herniated disc    . KNEE SURGERY Right     Current Outpatient Medications  Medication Sig Dispense Refill  . acetaminophen (TYLENOL) 500 MG tablet Take 1,000 mg by mouth every 6 (six) hours as needed for moderate pain.    Marland Kitchen amLODipine (NORVASC) 2.5 MG tablet Take 1 tablet (2.5 mg total) by mouth daily. 90 tablet 1  . atorvastatin (LIPITOR) 20 MG tablet Take 1 tablet (20 mg total) by mouth daily. 90 tablet 1  . clotrimazole (LOTRIMIN) 1 % cream Apply 1 application topically daily.     . diclofenac Sodium (VOLTAREN) 1 % GEL Apply 2 g topically 4 (four) times daily as needed (  pain).     . divalproex (DEPAKOTE ER) 500 MG 24 hr tablet Take 1 tablet (500 mg total) by mouth daily. 90 tablet 3  . FLUoxetine (PROZAC) 20 MG capsule Take 2 capsules (40 mg total) by mouth daily. (Patient taking differently: Take 20 mg by mouth daily. ) 180 capsule 1  . meloxicam (MOBIC) 15 MG tablet Take 15 mg by mouth daily.    . mirtazapine (REMERON) 15 MG tablet Take 15 mg by mouth at bedtime.    . tamsulosin (FLOMAX) 0.4 MG CAPS capsule Take 0.4 mg by mouth daily.     No current facility-administered medications for this visit.    Allergies as of 09/13/2020  . (No Known Allergies)    Vitals: There were no vitals taken for this visit. Last Weight:  Wt Readings from Last 1 Encounters:  05/29/20 155 lb (70.3 kg)   Last Height:   Ht Readings from Last 1 Encounters:  05/29/20 6\' 1"  (1.854 m)   Physical exam: Exam: Gen: NAD, , thin, poorly groomed                     CV: RRR, no MRG. No Carotid Bruits. No peripheral edema, warm, nontender Eyes: Conjunctivae clear without exudates or hemorrhage  Neuro: Detailed Neurologic Exam  Speech:    Speech is dysarthric due to adentulous Cognition:   .mmse    The patient is oriented to person     recent and remote memory impaired;     language fluent;     Impaired attention, concentration,  fund of knowledge Cranial Nerves:    The pupils are small and minimally reactive. Attempted fundoscopic exam could not visualize. . Visual fields are full to finger confrontation. Extraocular movements are intact saccades, impaired smooth pursuit. Trigeminal sensation is intact and the muscles of mastication are normal. The face is symmetric. The palate elevates in the midline. Hearing intact. Voice is normal. Shoulder shrug is normal. The tongue has normal motion without fasciculations.   Coordination:    Normal finger to nose    Gait:    Difficulty getting out of seat, uses a cane, short strides, mildly stooped  Motor Observation:    no involuntary movements noted. Tone:    Normal muscle tone.     Strength: right LE proximal weakness otherwise strength is V/V in the upper and lower limbs.      Sensation: intact to LT     Reflex Exam:  DTR's:    Deep tendon reflexes in the upper and lower extremities are symetricalbilaterally.   Toes:    The toes are withdrawal bilaterally.   Clonus:    Clonus is absent.       Assessment/Plan: 74 year old male here as a referral for syncope, family and patient state it is more that he "blamks out", loses consciousness, falls, becomes incoherent, eyes are open, no family history of seizures, there is a previous history of drug and alcohol use remotely but not coincident with symptoms.  MRI brain with and without contrast seizure protocol eeg routine and then possibly an extended EEG Start Depakote, returned fairly soon to discuss results, discussed side effects of Depakote, will check labs on follow-up visit  Discussed possible sequela of untreated epilepsy, significant morbidity and mortality, discussed seizure precautions.  Patient is unable to drive, operate heavy machinery, perform activities at heights or participate in water activities until  6 months seizure free  Discussed Patients with epilepsy have a small risk of sudden  unexpected death, a condition referred to as sudden unexpected death in epilepsy (SUDEP). SUDEP is defined specifically as the sudden, unexpected, witnessed or unwitnessed, nontraumatic and nondrowning death in patients with epilepsy with or without evidence for a seizure, and excluding documented status epilepticus, in which post mortem examination does not reveal a structural or toxicologic cause for death    No orders of the defined types were placed in this encounter.     Sarina Ill, MD  [facility] [address] [place], [place] [address]  Phone [phone] Fax [phone]

## 2020-09-13 ENCOUNTER — Encounter: Payer: Self-pay | Admitting: Neurology

## 2020-09-13 ENCOUNTER — Ambulatory Visit: Payer: Medicare Other | Admitting: Neurology

## 2020-09-17 ENCOUNTER — Ambulatory Visit (HOSPITAL_COMMUNITY)
Admission: EM | Admit: 2020-09-17 | Discharge: 2020-09-17 | Disposition: A | Discharge status: Home / Self Care | Payer: Medicare Other | Attending: Family Medicine | Admitting: Family Medicine

## 2020-09-17 ENCOUNTER — Other Ambulatory Visit: Payer: Self-pay

## 2020-09-17 ENCOUNTER — Encounter (HOSPITAL_COMMUNITY): Payer: Self-pay | Admitting: *Deleted

## 2020-09-17 DIAGNOSIS — R319 Hematuria, unspecified: Secondary | ICD-10-CM | POA: Insufficient documentation

## 2020-09-17 DIAGNOSIS — M7989 Other specified soft tissue disorders: Secondary | ICD-10-CM | POA: Diagnosis not present

## 2020-09-17 DIAGNOSIS — N390 Urinary tract infection, site not specified: Secondary | ICD-10-CM | POA: Diagnosis present

## 2020-09-17 LAB — POCT URINALYSIS DIPSTICK, ED / UC
Bilirubin Urine: NEGATIVE
Glucose, UA: NEGATIVE mg/dL
Ketones, ur: NEGATIVE mg/dL
Nitrite: POSITIVE — AB
Protein, ur: NEGATIVE mg/dL
Specific Gravity, Urine: 1.025 (ref 1.005–1.030)
Urobilinogen, UA: 2 mg/dL — ABNORMAL HIGH (ref 0.0–1.0)
pH: 7.5 (ref 5.0–8.0)

## 2020-09-17 LAB — CBC
HCT: 37 % — ABNORMAL LOW (ref 39.0–52.0)
Hemoglobin: 11.3 g/dL — ABNORMAL LOW (ref 13.0–17.0)
MCH: 29.1 pg (ref 26.0–34.0)
MCHC: 30.5 g/dL (ref 30.0–36.0)
MCV: 95.4 fL (ref 80.0–100.0)
Platelets: 179 10*3/uL (ref 150–400)
RBC: 3.88 MIL/uL — ABNORMAL LOW (ref 4.22–5.81)
RDW: 12.2 % (ref 11.5–15.5)
WBC: 6.1 10*3/uL (ref 4.0–10.5)
nRBC: 0 % (ref 0.0–0.2)

## 2020-09-17 LAB — BASIC METABOLIC PANEL
Anion gap: 10 (ref 5–15)
BUN: 13 mg/dL (ref 8–23)
CO2: 30 mmol/L (ref 22–32)
Calcium: 8.9 mg/dL (ref 8.9–10.3)
Chloride: 101 mmol/L (ref 98–111)
Creatinine, Ser: 0.82 mg/dL (ref 0.61–1.24)
GFR calc Af Amer: >60 mL/min (ref >60)
GFR calc non Af Amer: >60 mL/min (ref >60)
Glucose, Bld: 78 mg/dL (ref 70–99)
Potassium: 3.7 mmol/L (ref 3.5–5.1)
Sodium: 141 mmol/L (ref 135–145)

## 2020-09-17 MED ORDER — SULFAMETHOXAZOLE-TRIMETHOPRIM 800-160 MG PO TABS: 1.0000 | ORAL_TABLET | Freq: Two times a day (BID) | ORAL | 0 refills | Status: AC

## 2020-09-17 NOTE — ED Triage Notes (Signed)
Patient in with blood in urine on yesterday. Bilateral foot swelling x 3 months. Fx to right foot 4-5 months ago. Patient denies any pain.

## 2020-09-17 NOTE — ED Provider Notes (Signed)
[place]    CSN: [id number] Arrival date & time: 09/17/20  1133      History   Chief Complaint Chief Complaint  Patient presents with  . Hematuria  . Bilateral Foot Swelling    HPI Brad [name] is a 74 y.o. male.   HPI  Pleasant 74 year old gentleman who is here with his sister.  She does most of the talking.  He has multiple diagnoses including failure to thrive.  He has had multiple falls.  He does very little walking, with a wheeled chair or walker.  He has apathy, is being treated for depression, and also some memory impairment. He is here for a couple of problems.  He had blood in his urine yesterday.  When asked if he had blood in his urine today he said "I have a lot".  No history of kidney stones or kidney problems.  He does have a remote history of prostate cancer.  Does not know when his prostate has been checked last. He also has bilateral foot swelling for about 3 months.  It is minor.  Sister is concerned because it is a new finding.  He has close follow-up with his primary care doctor.  Recently saw neurology as well. Currently compliant in taking all of his medications including Depakote for seizure disorder.  Past Medical History:  Diagnosis Date  . Anemia   . Arthritis   . Cancer [facility]) 2017   prostate  . Depression   . Diabetes mellitus without complication ([place])   . History of stomach ulcers   . Hypertension   . Seizures ([place])   . Substance abuse [facility])     Patient Active Problem List   Diagnosis Date Noted  . Cervical spinal stenosis 12/14/2017  . Abnormal CT scan, neck 12/14/2017  . History of stroke 12/12/2017  . Hyperlipidemia 12/12/2017  . Seizure disorder ([place]) 12/12/2017  . KNEE PAIN, RIGHT 06/11/2009  . NECK PAIN 06/11/2009  . Hypertension 02/13/2008  . DENTAL CARIES 02/11/2008    Past Surgical History:  Procedure Laterality Date  . herniated disc    . KNEE SURGERY Right        Home Medications    Prior to  Admission medications   Medication Sig Start Date End Date Taking? Authorizing Provider  acetaminophen (TYLENOL) 500 MG tablet Take 1,000 mg by mouth every 6 (six) hours as needed for moderate pain.   Yes [provider]  amLODipine (NORVASC) 2.5 MG tablet Take 1 tablet (2.5 mg total) by mouth daily. 04/19/18  Yes Tereasa Coop, PA-C  atorvastatin (LIPITOR) 20 MG tablet Take 1 tablet (20 mg total) by mouth daily. 04/19/18  Yes Tereasa Coop, PA-C  clotrimazole (LOTRIMIN) 1 % cream Apply 1 application topically daily.  05/04/20  Yes [provider]  divalproex (DEPAKOTE ER) 500 MG 24 hr tablet Take 1 tablet (500 mg total) by mouth daily. 06/10/20  Yes Lomax, Amy, NP  FLUoxetine (PROZAC) 20 MG capsule Take 2 capsules (40 mg total) by mouth daily. Patient taking differently: Take 20 mg by mouth daily.  04/19/18  Yes Tereasa Coop, PA-C  meloxicam (MOBIC) 15 MG tablet Take 15 mg by mouth daily. 12/31/19  Yes [provider]  mirtazapine (REMERON) 15 MG tablet Take 15 mg by mouth at bedtime. 10/30/19  Yes [provider]  tamsulosin (FLOMAX) 0.4 MG CAPS capsule Take 0.4 mg by mouth daily. 05/03/20  Yes [provider]  diclofenac Sodium (VOLTAREN) 1 %  GEL Apply 2 g topically 4 (four) times daily as needed (pain).  12/31/19   [provider]  sulfamethoxazole-trimethoprim (BACTRIM DS) 800-160 MG tablet Take 1 tablet by mouth 2 (two) times daily for 7 days. 09/17/20 09/24/20  Raylene Everts, MD    Family History Family History  Problem Relation Age of Onset  . Seizures Neg Hx     Social History Social History   Tobacco Use  . Smoking status: Former Smoker    Types: Cigarettes  . Smokeless tobacco: Never Used  . Tobacco comment: occasional- patient smokes some marijauna from time to time  Vaping Use  . Vaping Use: Never used  Substance Use Topics  . Alcohol use: Not Currently    Alcohol/week: 2.0 - 3.0 standard drinks    Types: 2 - 3  Cans of beer per week    Comment: occasional  . Drug use: Not Currently    Types: Marijuana    Comment: occasional     Allergies   Patient has no known allergies.   Review of Systems Review of Systems See HPI  Physical Exam Triage Vital Signs ED Triage Vitals  Enc Vitals Group     BP 09/17/20 1311 (!) 195/91     Pulse Rate 09/17/20 1311 (!) 105     Resp 09/17/20 1311 16     Temp 09/17/20 1311 98.2 F (36.8 C)     Temp Source 09/17/20 1311 Oral     SpO2 09/17/20 1311 100 %     Weight --      Height --      Head Circumference --      Peak Flow --      Pain Score 09/17/20 1315 0     Pain Loc --      Pain Edu? --      Excl. in [place]? --    No data found.  Updated Vital Signs BP (!) 195/91 (BP Location: Right Arm)   Pulse (!) 105   Temp 98.2 F (36.8 C) (Oral)   Resp 16   SpO2 100%      Physical Exam Constitutional:      General: He is not in acute distress.    Appearance: He is well-developed.     Comments: Very thin man.  Muscle wasting.  Appears almost cachectic.  Lacks facial expression.  In wheelchair.  Very little vocalization but will answer direct questions  HENT:     Head: Normocephalic and atraumatic.     Nose: Nose normal. No congestion.     Mouth/Throat:     Mouth: Mucous membranes are moist.  Eyes:     Conjunctiva/sclera: Conjunctivae normal.     Pupils: Pupils are equal, round, and reactive to light.  Cardiovascular:     Rate and Rhythm: Rhythm irregular.     Heart sounds: No murmur heard.   Pulmonary:     Effort: Pulmonary effort is normal. No respiratory distress.     Breath sounds: Normal breath sounds. No rhonchi or rales.  Abdominal:     General: There is no distension.     Palpations: Abdomen is soft.     Tenderness: There is no abdominal tenderness.  Musculoskeletal:        General: Normal range of motion.     Cervical back: Normal range of motion.     Comments: Right hand has flexion deformities at all the MP joints.  Patient  states this is from arthritis and not because of  stroke or paresis.  Otherwise moves all extremities well Trace pedal edema bilaterally  Skin:    General: Skin is warm and dry.  Neurological:     Mental Status: He is alert.     Motor: Weakness present.     Gait: Gait abnormal.  Psychiatric:     Comments: Bland affect, apathetic      UC Treatments / Results  Labs (all labs ordered are listed, but only abnormal results are displayed) Labs Reviewed  CBC - Abnormal; Notable for the following components:      Result Value   RBC 3.88 (*)    Hemoglobin 11.3 (*)    HCT 37.0 (*)    All other components within normal limits  POCT URINALYSIS DIPSTICK, ED / UC - Abnormal; Notable for the following components:   Hgb urine dipstick TRACE (*)    Urobilinogen, UA 2.0 (*)    Nitrite POSITIVE (*)    Leukocytes,Ua SMALL (*)    All other components within normal limits  URINE CULTURE  BASIC METABOLIC PANEL    EKG-normal rate and rhythm.  No ST or T wave changes.  PACs noted   Radiology No results found.  Procedures Procedures (including critical care time)  Medications Ordered in UC Medications - No data to display  Initial Impression / Assessment and Plan / UC Course  I have reviewed the triage vital signs and the nursing notes.  Pertinent labs & imaging results that were available during my care of the patient were reviewed by me and considered in my medical decision making (see chart for details).     CBC shows mild anemia.  Improved from prior.  Basic metabolic panel is normal.  Urinalysis is positive for nitrites and blood.  We will do urine culture.  Results discussed with patient and sister Final Clinical Impressions(s) / UC Diagnoses   Final diagnoses:  Urinary tract infection with hematuria, site unspecified     Discharge Instructions     Drink more water Take the antibiotic as directed Follow up with your primary care doctor    ED Prescriptions     Medication Sig Dispense Auth. Provider   sulfamethoxazole-trimethoprim (BACTRIM DS) 800-160 MG tablet Take 1 tablet by mouth 2 (two) times daily for 7 days. 14 tablet Raylene Everts, MD     PDMP not reviewed this encounter.   Raylene Everts, MD 09/17/20 [phone]

## 2020-09-17 NOTE — Discharge Instructions (Signed)
Drink more water Take the antibiotic as directed Follow up with your primary care doctor

## 2020-09-17 NOTE — ED Notes (Signed)
Patient attempted to urinate but was unable to at this time.  

## 2020-09-19 LAB — URINE CULTURE
Culture: >=100000 — AB
Special Requests: NORMAL

## 2020-11-19 ENCOUNTER — Institutional Professional Consult (permissible substitution): Payer: Medicare Other | Admitting: Neurology

## 2020-11-29 ENCOUNTER — Encounter: Payer: Self-pay | Admitting: Family Medicine

## 2020-11-29 ENCOUNTER — Institutional Professional Consult (permissible substitution): Payer: Medicare Other | Admitting: Family Medicine

## 2020-11-29 NOTE — Progress Notes (Deleted)
No chief complaint on file.    HISTORY OF PRESENT ILLNESS: Today 11/29/20  Brad [name] is a 74 y.o. male here today for follow up for seizures.    HISTORY (copied from previous note)  Brad [name] is a 74 y.o. male here today for follow up for seizure.  He was last seen in our office in December 2018.  At that time there were concerns for continued seizure and syncopal episodes.  MRI performed in 2018 showed chronic small vessel ischemia with lacunar infarcts and chronic ventriculomegaly favored to reflect cerebral atrophy.  He was prescribed Depakote ER 500 mg daily.  His sister is here with him today and aids in history.  She states that he had taken this medication fairly consistently but over the past year dosing has been inconsistent.  Last prescription was written in 6 of 2019.  She reports that she will find medication plan by his chair in the living room.  Unfortunately, he did have a seizure in May, 2021.  ER evaluation was unremarkable and he was restarted on Depakote.  He reports that he had taken medication consistently until 3 days ago when he ran out.  He was also seen in June for a fall with head injury.  CT scan was unremarkable.  His sister reports that he has a very difficult time with strength.  She states that over the past year he has been less interested in doing things around the home.  She reports that he is either sitting or lying down for 23 hours a day.  He does have some right-sided knee pain and today in a walking boot due to the fall in June.  She reports that he is eating well.  He states that he may have 2 glasses of some sort of fluid throughout the day.  He does say that he feels sad sometimes but overall does not feel depressed.  He is followed closely by primary care.  He was just seen a couple weeks ago.  There was some concerns of hypoglycemia.  He has had multiple falls over the past year.  He was previously working with physical therapy in 2019 but his  sister states that he refused to continue care.  He is interested in restarting therapy at this time.  He is able to walk around the home with his walker.  He feels that all of his falls are due to feeling off balance.  He denies dizziness with standing.  He lives at home with his sister.  He does not drive.  He does have some memory loss.  HISTORY: (copied from [employer] note on 12/12/2017)  UPDATE12/26/2018CMMr. [name],74 year old male returns for follow-up with his sister. He was initially evaluated in October by Dr. Jaynee Eagles forseizures and syncope.MRI of the brain on 11/7/20181. No acute intracranial abnormality. 2. Chronic small vessel ischemic disease with lacunar infarcts as above. Suspected small late subacute infarct in the right corona radiata. 3. Chronic ventriculomegaly favored to reflect cerebral atrophy rather than hydrocephalus. CTA headAnterior circulation: Mild atheromatous plaque on the carotid siphons. No branch occlusion or flow limiting stenosis, beading, or aneurysm. Posterior circulation: Codominant vertebral arteries. Dominant left PICA and right AICA. Small left AICA. Basilar and vertebral arteries are smooth and widely patent. Hypoplastic right and aplastic left P1 segments. No branch occlusion or flow limiting stenosis. Negative for aneurysm. CTA neckCervical carotid atherosclerosis with 60% left ICA stenosis. No flow limiting stenosis or definite ulceration on the right. 2.  No proximal subclavian or vertebral stenosis. 3. Limited intracranial atherosclerosis without stenosis. 4. Atrophy and extensive chronic small vessel ischemia in the cerebral white matter. 5. Prominent cervical disc degeneration and ligamentum flavum thickening. Spinal stenosis with cord impingement at C4-5 and C5-6. He is currently on aspirin for secondary stroke prevention. He has no bruising and no bleeding cholesterol drawn by primary care on 11/01/2017 with LDL 142. He  was placed on Lipitor. No complaints of myalgias. Hemoglobin A1c 4.4. Blood pressure in the office today 152/84. He is on Depakote for seizure activity and needs level drawn today. EEG was normal. No recent seizure activity per sister. He does not exercise, pretty much sits in the chair all day and watches TV.He has not had echocardiogram done. He returns for reevaluation  10/11/17 Brad [name] a 74 y.o.malehere as a referral from Dr. Anthoney Harada syncopeand seizures.Past medical history of substance abuse, seizures, hypertension, diabetes, depression, arthritis, anemia, dementia.He is with his sister and brother in law. Patient says he is sitting around, he feels tired, he "blanks out". He denies remembering while he is blanked out. At least 3 times since July or august. Lasts a few minutes. He loses consciousness and falls. He becomes incoherant, "eyes open but not there" staring into space per brother in law. They help him off the ground and he comes to. Per sister he had "fallen out" in the past and one time he was taken to the local hospital for observation but unclear what the outcome was. She doesn't know, but has seen these staring, incoherant episodes. Last one was Tuesday. Previously 3-4 weeks ago. Then a month beforehand. The first one he was standing on the sidewalk and he just "started going down". No FHx of seizures. Previous drug and alcohol use not anymore. But not drug or alcoholr elated.No other focal neurologic deficits, associated symptoms, inciting events or modifiable factors.   REVIEW OF SYSTEMS: Out of a complete 14 system review of symptoms, the patient complains only of the following symptoms, and all other reviewed systems are negative.   ALLERGIES: No Known Allergies   HOME MEDICATIONS: Outpatient Medications Prior to Visit  Medication Sig Dispense Refill  . acetaminophen (TYLENOL) 500 MG tablet Take 1,000 mg by mouth every 6 (six) hours as needed for  moderate pain.    Marland Kitchen amLODipine (NORVASC) 2.5 MG tablet Take 1 tablet (2.5 mg total) by mouth daily. 90 tablet 1  . atorvastatin (LIPITOR) 20 MG tablet Take 1 tablet (20 mg total) by mouth daily. 90 tablet 1  . clotrimazole (LOTRIMIN) 1 % cream Apply 1 application topically daily.     . diclofenac Sodium (VOLTAREN) 1 % GEL Apply 2 g topically 4 (four) times daily as needed (pain).     Marland Kitchen divalproex (DEPAKOTE ER) 500 MG 24 hr tablet Take 1 tablet (500 mg total) by mouth daily. 90 tablet 3  . FLUoxetine (PROZAC) 20 MG capsule Take 2 capsules (40 mg total) by mouth daily. (Patient taking differently: Take 20 mg by mouth daily. ) 180 capsule 1  . meloxicam (MOBIC) 15 MG tablet Take 15 mg by mouth daily.    . mirtazapine (REMERON) 15 MG tablet Take 15 mg by mouth at bedtime.    . tamsulosin (FLOMAX) 0.4 MG CAPS capsule Take 0.4 mg by mouth daily.     No facility-administered medications prior to visit.     PAST MEDICAL HISTORY: Past Medical History:  Diagnosis Date  . Anemia   . Arthritis   .  Cancer [facility]) 2017   prostate  . Depression   . Diabetes mellitus without complication ([place])   . History of stomach ulcers   . Hypertension   . Seizures ([place])   . Substance abuse ([place])      PAST SURGICAL HISTORY: Past Surgical History:  Procedure Laterality Date  . herniated disc    . KNEE SURGERY Right      FAMILY HISTORY: Family History  Problem Relation Age of Onset  . Seizures Neg Hx      SOCIAL HISTORY: Social History   Socioeconomic History  . Marital status: Single    Spouse name: Not on file  . Number of children: Not on file  . Years of education: Not on file  . Highest education level: High school graduate  Occupational History  . Occupation: retired  Tobacco Use  . Smoking status: Former Smoker    Types: Cigarettes  . Smokeless tobacco: Never Used  . Tobacco comment: occasional- patient smokes some marijauna from time to time  Vaping Use  . Vaping Use: Never used   Substance and Sexual Activity  . Alcohol use: Not Currently    Alcohol/week: 2.0 - 3.0 standard drinks    Types: 2 - 3 Cans of beer per week    Comment: occasional  . Drug use: Not Currently    Types: Marijuana    Comment: occasional  . Sexual activity: Not on file  Other Topics Concern  . Not on file  Social History Narrative  . Not on file   Social Determinants of Health   Financial Resource Strain: Not on file  Food Insecurity: Not on file  Transportation Needs: Not on file  Physical Activity: Not on file  Stress: Not on file  Social Connections: Not on file  Intimate Partner Violence: Not on file      PHYSICAL EXAM  There were no vitals filed for this visit. There is no height or weight on file to calculate BMI.   Generalized: Well developed, in no acute distress  Cardiology: normal rate and rhythm, no murmur auscultated  Respiratory: clear to auscultation bilaterally    Neurological examination  Mentation: Alert oriented to time, place, history taking. Follows all commands speech and language fluent Cranial nerve II-XII: Pupils were equal round reactive to light. Extraocular movements were full, visual field were full on confrontational test. Facial sensation and strength were normal. Uvula tongue midline. Head turning and shoulder shrug  were normal and symmetric. Motor: The motor testing reveals 5 over 5 strength of all 4 extremities. Good symmetric motor tone is noted throughout.  Sensory: Sensory testing is intact to soft touch on all 4 extremities. No evidence of extinction is noted.  Coordination: Cerebellar testing reveals good finger-nose-finger and heel-to-shin bilaterally.  Gait and station: Gait is normal. Tandem gait is normal. Romberg is negative. No drift is seen.  Reflexes: Deep tendon reflexes are symmetric and normal bilaterally.     DIAGNOSTIC DATA (LABS, IMAGING, TESTING) - I reviewed patient records, labs, notes, testing and imaging myself  where available.  Lab Results  Component Value Date   WBC 6.1 09/17/2020   HGB 11.3 (L) 09/17/2020   HCT 37.0 (L) 09/17/2020   MCV 95.4 09/17/2020   PLT 179 09/17/2020      Component Value Date/Time   NA 141 09/17/2020 1346   NA 142 04/19/2018 1629   K 3.7 09/17/2020 1346   CL 101 09/17/2020 1346   CO2 30 09/17/2020 1346   GLUCOSE  78 09/17/2020 1346   BUN 13 09/17/2020 1346   BUN 21 04/19/2018 1629   CREATININE 0.82 09/17/2020 1346   CALCIUM 8.9 09/17/2020 1346   PROT 7.3 05/29/2020 1618   PROT 6.9 04/19/2018 1629   ALBUMIN 3.9 05/29/2020 1618   ALBUMIN 4.2 04/19/2018 1629   AST 25 05/29/2020 1618   ALT 14 05/29/2020 1618   ALKPHOS 107 05/29/2020 1618   BILITOT 1.0 05/29/2020 1618   BILITOT 0.5 04/19/2018 1629   GFRNONAA >60 09/17/2020 1346   GFRAA >60 09/17/2020 1346   Lab Results  Component Value Date   CHOL 109 04/19/2018   HDL 44 04/19/2018   LDLCALC 55 04/19/2018   TRIG 49 04/19/2018   CHOLHDL 2.5 04/19/2018   Lab Results  Component Value Date   HGBA1C 4.6 (L) 04/19/2018   Lab Results  Component Value Date   VITAMINB12 920 (H) 06/10/2008   Lab Results  Component Value Date   TSH 3.610 07/13/2017    No flowsheet data found.   ASSESSMENT AND PLAN  74 y.o. year old male  has a past medical history of Anemia, Arthritis, Cancer ([place]) (2017), Depression, Diabetes mellitus without complication ([place]), History of stomach ulcers, Hypertension, Seizures ([place]), and Substance abuse ([place]). here with   No diagnosis found.    No orders of the defined types were placed in this encounter.     I spent 20 minutes of face-to-face and non-face-to-face time with patient.  This included previsit chart review, lab review, study review, order entry, electronic health record documentation, patient education.    Debbora Presto, MSN, FNP-C 11/29/2020, 7:31 AM  [facility] [address], [place], [place] [address] [phone]

## 2020-12-04 ENCOUNTER — Inpatient Hospital Stay (HOSPITAL_COMMUNITY)
Admission: EM | Admit: 2020-12-04 | Discharge: 2020-12-31 | DRG: 100 | Disposition: A | Discharge status: Skilled Nursing Facility | Payer: Medicare Other | Attending: Infectious Disease | Admitting: Infectious Disease

## 2020-12-04 ENCOUNTER — Emergency Department (HOSPITAL_COMMUNITY): Payer: Medicare Other

## 2020-12-04 DIAGNOSIS — I10 Essential (primary) hypertension: Secondary | ICD-10-CM | POA: Diagnosis present

## 2020-12-04 DIAGNOSIS — G40919 Epilepsy, unspecified, intractable, without status epilepticus: Secondary | ICD-10-CM | POA: Diagnosis present

## 2020-12-04 DIAGNOSIS — R1310 Dysphagia, unspecified: Secondary | ICD-10-CM | POA: Diagnosis present

## 2020-12-04 DIAGNOSIS — G40909 Epilepsy, unspecified, not intractable, without status epilepticus: Principal | ICD-10-CM | POA: Diagnosis present

## 2020-12-04 DIAGNOSIS — N400 Enlarged prostate without lower urinary tract symptoms: Secondary | ICD-10-CM | POA: Diagnosis present

## 2020-12-04 DIAGNOSIS — R627 Adult failure to thrive: Secondary | ICD-10-CM | POA: Diagnosis present

## 2020-12-04 DIAGNOSIS — L89516 Pressure-induced deep tissue damage of right ankle: Secondary | ICD-10-CM | POA: Diagnosis not present

## 2020-12-04 DIAGNOSIS — R4701 Aphasia: Secondary | ICD-10-CM | POA: Diagnosis present

## 2020-12-04 DIAGNOSIS — Z87891 Personal history of nicotine dependence: Secondary | ICD-10-CM

## 2020-12-04 DIAGNOSIS — R159 Full incontinence of feces: Secondary | ICD-10-CM | POA: Diagnosis present

## 2020-12-04 DIAGNOSIS — F05 Delirium due to known physiological condition: Secondary | ICD-10-CM | POA: Diagnosis not present

## 2020-12-04 DIAGNOSIS — L8990 Pressure ulcer of unspecified site, unspecified stage: Secondary | ICD-10-CM | POA: Diagnosis present

## 2020-12-04 DIAGNOSIS — Z8546 Personal history of malignant neoplasm of prostate: Secondary | ICD-10-CM

## 2020-12-04 DIAGNOSIS — T426X6A Underdosing of other antiepileptic and sedative-hypnotic drugs, initial encounter: Secondary | ICD-10-CM | POA: Diagnosis present

## 2020-12-04 DIAGNOSIS — R569 Unspecified convulsions: Secondary | ICD-10-CM | POA: Diagnosis not present

## 2020-12-04 DIAGNOSIS — R471 Dysarthria and anarthria: Secondary | ICD-10-CM | POA: Diagnosis present

## 2020-12-04 DIAGNOSIS — Z7401 Bed confinement status: Secondary | ICD-10-CM

## 2020-12-04 DIAGNOSIS — R54 Age-related physical debility: Secondary | ICD-10-CM | POA: Diagnosis present

## 2020-12-04 DIAGNOSIS — R414 Neurologic neglect syndrome: Secondary | ICD-10-CM | POA: Diagnosis present

## 2020-12-04 DIAGNOSIS — G8191 Hemiplegia, unspecified affecting right dominant side: Secondary | ICD-10-CM | POA: Diagnosis present

## 2020-12-04 DIAGNOSIS — R64 Cachexia: Secondary | ICD-10-CM | POA: Diagnosis present

## 2020-12-04 DIAGNOSIS — Z1611 Resistance to penicillins: Secondary | ICD-10-CM | POA: Diagnosis present

## 2020-12-04 DIAGNOSIS — L89626 Pressure-induced deep tissue damage of left heel: Secondary | ICD-10-CM | POA: Diagnosis not present

## 2020-12-04 DIAGNOSIS — E785 Hyperlipidemia, unspecified: Secondary | ICD-10-CM | POA: Diagnosis present

## 2020-12-04 DIAGNOSIS — Z79899 Other long term (current) drug therapy: Secondary | ICD-10-CM

## 2020-12-04 DIAGNOSIS — E876 Hypokalemia: Secondary | ICD-10-CM | POA: Diagnosis present

## 2020-12-04 DIAGNOSIS — I160 Hypertensive urgency: Secondary | ICD-10-CM | POA: Diagnosis present

## 2020-12-04 DIAGNOSIS — D638 Anemia in other chronic diseases classified elsewhere: Secondary | ICD-10-CM | POA: Diagnosis present

## 2020-12-04 DIAGNOSIS — Z66 Do not resuscitate: Secondary | ICD-10-CM | POA: Diagnosis not present

## 2020-12-04 DIAGNOSIS — B961 Klebsiella pneumoniae [K. pneumoniae] as the cause of diseases classified elsewhere: Secondary | ICD-10-CM | POA: Diagnosis not present

## 2020-12-04 DIAGNOSIS — E43 Unspecified severe protein-calorie malnutrition: Secondary | ICD-10-CM | POA: Insufficient documentation

## 2020-12-04 DIAGNOSIS — N390 Urinary tract infection, site not specified: Secondary | ICD-10-CM | POA: Diagnosis not present

## 2020-12-04 DIAGNOSIS — D696 Thrombocytopenia, unspecified: Secondary | ICD-10-CM | POA: Diagnosis present

## 2020-12-04 DIAGNOSIS — Z681 Body mass index (BMI) 19 or less, adult: Secondary | ICD-10-CM

## 2020-12-04 DIAGNOSIS — J9811 Atelectasis: Secondary | ICD-10-CM | POA: Diagnosis not present

## 2020-12-04 DIAGNOSIS — R0602 Shortness of breath: Secondary | ICD-10-CM

## 2020-12-04 DIAGNOSIS — Z20822 Contact with and (suspected) exposure to covid-19: Secondary | ICD-10-CM | POA: Diagnosis present

## 2020-12-04 DIAGNOSIS — Z91138 Patient's unintentional underdosing of medication regimen for other reason: Secondary | ICD-10-CM

## 2020-12-04 DIAGNOSIS — M1990 Unspecified osteoarthritis, unspecified site: Secondary | ICD-10-CM | POA: Diagnosis present

## 2020-12-04 DIAGNOSIS — I6522 Occlusion and stenosis of left carotid artery: Secondary | ICD-10-CM | POA: Diagnosis present

## 2020-12-04 DIAGNOSIS — E871 Hypo-osmolality and hyponatremia: Secondary | ICD-10-CM | POA: Diagnosis present

## 2020-12-04 DIAGNOSIS — L89152 Pressure ulcer of sacral region, stage 2: Secondary | ICD-10-CM | POA: Diagnosis present

## 2020-12-04 DIAGNOSIS — L89616 Pressure-induced deep tissue damage of right heel: Secondary | ICD-10-CM | POA: Diagnosis not present

## 2020-12-04 DIAGNOSIS — F0390 Unspecified dementia without behavioral disturbance: Secondary | ICD-10-CM | POA: Diagnosis present

## 2020-12-04 DIAGNOSIS — F32A Depression, unspecified: Secondary | ICD-10-CM | POA: Diagnosis present

## 2020-12-04 LAB — COMPREHENSIVE METABOLIC PANEL
ALT: 17 U/L (ref 0–44)
AST: 28 U/L (ref 15–41)
Albumin: 3.6 g/dL (ref 3.5–5.0)
Alkaline Phosphatase: 70 U/L (ref 38–126)
Anion gap: 15 (ref 5–15)
BUN: 20 mg/dL (ref 8–23)
CO2: 22 mmol/L (ref 22–32)
Calcium: 9.1 mg/dL (ref 8.9–10.3)
Chloride: 101 mmol/L (ref 98–111)
Creatinine, Ser: 0.94 mg/dL (ref 0.61–1.24)
GFR, Estimated: >60 mL/min (ref >60)
Glucose, Bld: 75 mg/dL (ref 70–99)
Potassium: 4.4 mmol/L (ref 3.5–5.1)
Sodium: 138 mmol/L (ref 135–145)
Total Bilirubin: 1.1 mg/dL (ref 0.3–1.2)
Total Protein: 7 g/dL (ref 6.5–8.1)

## 2020-12-04 LAB — DIFFERENTIAL
Abs Immature Granulocytes: 0.05 10*3/uL (ref 0.00–0.07)
Basophils Absolute: 0 10*3/uL (ref 0.0–0.1)
Basophils Relative: 0 %
Eosinophils Absolute: 0 10*3/uL (ref 0.0–0.5)
Eosinophils Relative: 0 %
Immature Granulocytes: 1 %
Lymphocytes Relative: 13 %
Lymphs Abs: 1.3 10*3/uL (ref 0.7–4.0)
Monocytes Absolute: 0.3 10*3/uL (ref 0.1–1.0)
Monocytes Relative: 3 %
Neutro Abs: 8.3 10*3/uL — ABNORMAL HIGH (ref 1.7–7.7)
Neutrophils Relative %: 83 %

## 2020-12-04 LAB — CBC
HCT: 36.5 % — ABNORMAL LOW (ref 39.0–52.0)
Hemoglobin: 11.7 g/dL — ABNORMAL LOW (ref 13.0–17.0)
MCH: 31.4 pg (ref 26.0–34.0)
MCHC: 32.1 g/dL (ref 30.0–36.0)
MCV: 97.9 fL (ref 80.0–100.0)
Platelets: 206 10*3/uL (ref 150–400)
RBC: 3.73 MIL/uL — ABNORMAL LOW (ref 4.22–5.81)
RDW: 12.3 % (ref 11.5–15.5)
WBC: 9.9 10*3/uL (ref 4.0–10.5)
nRBC: 0 % (ref 0.0–0.2)

## 2020-12-04 LAB — I-STAT CHEM 8, ED
BUN: 28 mg/dL — ABNORMAL HIGH (ref 8–23)
Calcium, Ion: 1.2 mmol/L (ref 1.15–1.40)
Chloride: 102 mmol/L (ref 98–111)
Creatinine, Ser: 0.9 mg/dL (ref 0.61–1.24)
Glucose, Bld: 73 mg/dL (ref 70–99)
HCT: 38 % — ABNORMAL LOW (ref 39.0–52.0)
Hemoglobin: 12.9 g/dL — ABNORMAL LOW (ref 13.0–17.0)
Potassium: 4.5 mmol/L (ref 3.5–5.1)
Sodium: 140 mmol/L (ref 135–145)
TCO2: 30 mmol/L (ref 22–32)

## 2020-12-04 LAB — APTT: aPTT: 28 seconds (ref 24–36)

## 2020-12-04 LAB — PROTIME-INR
INR: 1.2 (ref 0.8–1.2)
Prothrombin Time: 14.3 seconds (ref 11.4–15.2)

## 2020-12-04 MED ORDER — SODIUM CHLORIDE 0.9% FLUSH
3.0000 mL | Freq: Once | INTRAVENOUS | Status: AC
  Administered 2020-12-05: 3 mL via INTRAVENOUS

## 2020-12-04 MED ORDER — IOHEXOL 350 MG/ML SOLN
110.0000 mL | Freq: Once | INTRAVENOUS | Status: AC | PRN
  Administered 2020-12-04: 110 mL via INTRAVENOUS

## 2020-12-04 NOTE — ED Notes (Signed)
Patient transported to CT 

## 2020-12-04 NOTE — ED Triage Notes (Signed)
Pt coming with EMS from home after family called due to pt having AMS. LKW was 0745 this morning and per sister he was slowly acting differently; last checked on pt at 2130. Sister states she went to go check on him at that time to ask him to eat and he was just staring at her and not responding. Sister states he was difficult to understand and pt was unable to get out of the bed.

## 2020-12-04 NOTE — ED Notes (Signed)
Pt arrived in CT 

## 2020-12-04 NOTE — ED Provider Notes (Addendum)
TIME SEEN: 11:16 PM  CHIEF COMPLAINT: Code stroke  HPI: Patient is a 74 year old male with history of hypertension, diabetes, seizures on Depakote, substance abuse who presents to the emergency department with EMS as a code stroke. Last seen normal at 9 PM. Family told EMS that patient had garbled speech. They report that he seemed weaker throughout the day. It seems that patient is bedbound at baseline. EMS reported right-sided weakness. Patient having a hard time answering questions. Blood glucose in the 90s with EMS. Blood pressure only 160s/100s.  EMS reports patient felt warm to touch and "smelled like a UTI".  ROS: Level five caveat for acuity  PAST MEDICAL HISTORY/PAST SURGICAL HISTORY:  Past Medical History:  Diagnosis Date   Anemia    Arthritis    Cancer ([place]) 2017   prostate   Depression    Diabetes mellitus without complication ([place])    History of stomach ulcers    Hypertension    Seizures ([place])    Substance abuse ([place])     MEDICATIONS:  Prior to Admission medications   Medication Sig Start Date End Date Taking? Authorizing Provider  acetaminophen (TYLENOL) 500 MG tablet Take 1,000 mg by mouth every 6 (six) hours as needed for moderate pain.    [provider]  amLODipine (NORVASC) 2.5 MG tablet Take 1 tablet (2.5 mg total) by mouth daily. 04/19/18   Tereasa Coop, PA-C  atorvastatin (LIPITOR) 20 MG tablet Take 1 tablet (20 mg total) by mouth daily. 04/19/18   Tereasa Coop, PA-C  clotrimazole (LOTRIMIN) 1 % cream Apply 1 application topically daily.  05/04/20   [provider]  diclofenac Sodium (VOLTAREN) 1 % GEL Apply 2 g topically 4 (four) times daily as needed (pain).  12/31/19   [provider]  divalproex (DEPAKOTE ER) 500 MG 24 hr tablet Take 1 tablet (500 mg total) by mouth daily. 06/10/20   Lomax, Amy, NP  FLUoxetine (PROZAC) 20 MG capsule Take 2 capsules (40 mg total) by mouth daily. Patient taking differently: Take 20 mg by  mouth daily.  04/19/18   Tereasa Coop, PA-C  meloxicam (MOBIC) 15 MG tablet Take 15 mg by mouth daily. 12/31/19   [provider]  mirtazapine (REMERON) 15 MG tablet Take 15 mg by mouth at bedtime. 10/30/19   [provider]  tamsulosin (FLOMAX) 0.4 MG CAPS capsule Take 0.4 mg by mouth daily. 05/03/20   [provider]    ALLERGIES:  No Known Allergies  SOCIAL HISTORY:  Social History   Tobacco Use   Smoking status: Former Smoker    Types: Cigarettes   Smokeless tobacco: Never Used   Tobacco comment: occasional- patient smokes some marijauna from time to time  Substance Use Topics   Alcohol use: Not Currently    Alcohol/week: 2.0 - 3.0 standard drinks    Types: 2 - 3 Cans of beer per week    Comment: occasional    FAMILY HISTORY: Family History  Problem Relation Age of Onset   Seizures Neg Hx     EXAM: BP (!) 178/98    Pulse 83    Temp 97.8 F (36.6 C) (Rectal)    Resp 17    Ht 6\' 1"  (1.854 m)    Wt 58.3 kg    SpO2 94%    BMI 16.96 kg/m  CONSTITUTIONAL: Alert and oriented to person but does not answer all questions appropriately or follow commands. Elderly. Thin. HEAD: Normocephalic, appears atraumatic EYES: Conjunctivae clear,  pupils appear equal, EOM appear intact ENT: normal nose; moist mucous membranes NECK: Supple, normal ROM CARD: RRR; S1 and S2 appreciated; no murmurs, no clicks, no rubs, no gallops RESP: Normal chest excursion without splinting or tachypnea; breath sounds clear and equal bilaterally; no wheezes, no rhonchi, no rales, no hypoxia or respiratory distress, speaking full sentences ABD/GI: Normal bowel sounds; non-distended; soft, non-tender, no rebound, no guarding, no peritoneal signs, no hepatosplenomegaly BACK:  The back appears normal EXT: Normal ROM in all joints; no deformity noted, no edema; no cyanosis SKIN: Normal color for age and race; warm; no rash on exposed skin NEURO: Normal movement of the left upper  extremity but unable to move the right arm. No obvious facial asymmetry. Speech does appear garbled. Patient has a difficult time following commands.  Patient appears to have right-sided neglect.  His extremities are very rigid. PSYCH: The patient's mood and manner are appropriate.   MEDICAL DECISION MAKING: Patient here as a code stroke. Dr. Cheral Marker with neurology at bedside upon patient's arrival. We will take emergently to CT imaging. Blood glucose here 73.  ED PROGRESS: Patient's CT imaging is unremarkable. Dr. Cheral Marker had further discussion with patient's family and this sounds more like a seizure.  He suspects his right-sided weakness, neglect and rigidity are due to postictal state and being bedbound.  Patient missed his home Depakote this morning. Depakote level pending. Dr. Cheral Marker will place patient on extended release Depakote and will load with valproic acid in the ED and recommends admission to medicine for an EEG in the morning.   12:58 AM Discussed patient's case with hospitalist, Dr. Cheral Marker.  I have recommended admission and patient (and family if present) agree with this plan. Admitting physician will place admission orders.   I reviewed all nursing notes, vitals, pertinent previous records and reviewed/interpreted all EKGs, lab and urine results, imaging (as available).    Date: 12/05/2020 00:57  Rate: 91  Rhythm: normal sinus rhythm  QRS Axis: normal  Intervals: IVCD  ST/T Wave abnormalities: anterior ST flattening  Conduction Disutrbances: none  Narrative Interpretation: IVCD, artifact, ST flattening anteriorly, no change compared to prior       Brad [name] was evaluated in Emergency Department on 12/04/2020 for the symptoms described in the history of present illness. He was evaluated in the context of the global COVID-19 pandemic, which necessitated consideration that the patient might be at risk for infection with the SARS-CoV-2 virus that causes COVID-19.  Institutional protocols and algorithms that pertain to the evaluation of patients at risk for COVID-19 are in a state of rapid change based on information released by regulatory bodies including the CDC and federal and state organizations. These policies and algorithms were followed during the patient's care in the ED.      Velvie Thomaston, Delice Bison, DO 12/05/20 [place], Delice Bison, DO 12/05/20 1517

## 2020-12-04 NOTE — Consult Note (Signed)
Referring Physician: Dr. Leonides Schanz    Chief Complaint: Acute onset of right sided weakness and AMS  HPI: Brad [name] is an 74 y.o. male with a PMHx of prostate cancer, anemia, arthritis, dementia, depression, DM, HTN, seizures and substance abuse who presents with acute onset of an awake unresponsive state. He was LKN this morning at 0730 when he asked for some cookies. He was noted to be abnormal at 8 to 9 PM when his sister gave him some food to eat - she put his food down for him at that time but he was not responding well. When she came back at 9:30 PM he was in his bed lying back with his right arm shaking. His eyes were partially closed. When she asked him to look at her, he opened tham a little bit but his eyes appeared glassy and foggy. He was unable to say anything to her at that time - some verbal output but was unintelligible except for "yeah" in response to "are you ok?". She spent several minutes trying to feed him but he continued to be poorly responsive, so she called EMS. His hands were "clenched together" resembling "kind of shivering". This continued until EMS arrived. Total duration of witnessed seizure-like activity was about 20 minutes. She felt that it was probably a seizure. He has been incontinent of urine and bowel for about the last year and a half.   Per EMS, he was not paying his attention to his right side when they arrived. No gaze deviation or active jerking/twitching noted by EMS. Vitals were as follows: BP 174/100, HR 78 irregular, SPO2 89 on RA, temp 98.9. On arrival to the ED, he continued to be poorly responsive. No seizure-like activity noted.   For months, he has been non-ambulatory and bedbound except for daily transfers to a chair with help of family. He no longer ambulates. He was last ambulatory in 2018; in the Winter of that year he "seemed to lose all motivation" and stopped walking. He used to be active and would go fishing with his sister. Per his sister, he was  an "alcoholic for years". Currently his main activity during the day is watching TV. He essentially refuses to engage in all activities offered to him by family. Per sister, his gait was not magnetic or shuffling prior to becoming bedbound.   For his seizures he is on Depakote. He did not take his medication today.   Sister states that he is a full code.   LSN: 0730 tPA Given: No: Out of time window. Presentation most consistent with breakthrough seizure followed by postictal state.   Past Medical History:  Diagnosis Date  . Anemia   . Arthritis   . Cancer [facility]) 2017   prostate  . Depression   . Diabetes mellitus without complication ([place])   . History of stomach ulcers   . Hypertension   . Seizures ([place])   . Substance abuse [facility])     Past Surgical History:  Procedure Laterality Date  . herniated disc    . KNEE SURGERY Right     Family History  Problem Relation Age of Onset  . Seizures Neg Hx    Social History:  reports that he has quit smoking. His smoking use included cigarettes. He has never used smokeless tobacco. He reports previous alcohol use of about 2.0 - 3.0 standard drinks of alcohol per week. He reports previous drug use. Drug: Marijuana.  Allergies: No Known Allergies  Medications:  No  current facility-administered medications on file prior to encounter.   Current Outpatient Medications on File Prior to Encounter  Medication Sig Dispense Refill  . acetaminophen (TYLENOL) 500 MG tablet Take 1,000 mg by mouth every 6 (six) hours as needed for moderate pain.    Marland Kitchen amLODipine (NORVASC) 2.5 MG tablet Take 1 tablet (2.5 mg total) by mouth daily. 90 tablet 1  . atorvastatin (LIPITOR) 20 MG tablet Take 1 tablet (20 mg total) by mouth daily. 90 tablet 1  . clotrimazole (LOTRIMIN) 1 % cream Apply 1 application topically daily.     . diclofenac Sodium (VOLTAREN) 1 % GEL Apply 2 g topically 4 (four) times daily as needed (pain).     Marland Kitchen divalproex (DEPAKOTE ER) 500 MG 24  hr tablet Take 1 tablet (500 mg total) by mouth daily. 90 tablet 3  . FLUoxetine (PROZAC) 20 MG capsule Take 2 capsules (40 mg total) by mouth daily. (Patient taking differently: Take 20 mg by mouth daily. ) 180 capsule 1  . meloxicam (MOBIC) 15 MG tablet Take 15 mg by mouth daily.    . mirtazapine (REMERON) 15 MG tablet Take 15 mg by mouth at bedtime.    . tamsulosin (FLOMAX) 0.4 MG CAPS capsule Take 0.4 mg by mouth daily.      ROS: Unable to obtain due to AMS.   Physical Examination: Weight 58.3 kg.  HEENT: Ethan/AT Lungs: Respirations unlabored Ext: No edema Skin: Dry skin with diffusely distributed small hypopigmented patches throughout. Onychomycosis is also noted.   Neurologic Examination: Mental Status: Awake. Right sided neglect. Will verbalize with hypophonic utterances that are unintelligible. Does not follow commands but will attempt to verbalize an answer some questions. Will track examiner when examiner walks side to side while facing patient.  Cranial Nerves: II:  Blinks to threat on the left, but not on the right. PERRL.  III,IV, VI: Left sided gaze preference. Will track conjugately to the right with some difficulty. No nystagmus. No ptosis. Eye movements during tracking are saccadic.  V,VII: Face is symmetric. Hypomimia noted. Reacts to tactile stimulation bilaterally.  VIII: hearing intact to voice.  IX,X: Hypophonic speech XI: Head preferentially rotated to the left.  XII: midline tongue is noted when mouth is open, but does not protrude to command.  Motor: Flexor and extensor rigidity noted in all tested muscle groups of upper and lower extremities bilaterally. Waxy rigidity of upper extremities is noted; when arms are passively raised, they remain antigravity where they are positioned for > 60 seconds after examiner releases them.  Does not follow any motor commands. Some slow volitional movements of upper extremities are noted. Withdraws BLE to noxious plantar  stimulation, left > right. Movement to tactile stimulation of upper extremities is less frequent on the right compared to the left.   Deep Tendon Reflexes:  Hypoactive x 4 in the context of limb rigidity.  Plantars: Mute bilaterally  Cerebellar/Gait: Unable to assess   CT head: 1. No acute intracranial abnormality. 2. ASPECTS is 10.  CTA of head and neck with CTP: 1. No emergent large vessel occlusion or high-grade stenosis of the intracranial arteries. 2. Unchanged 60% stenosis of the proximal left internal carotid artery secondary to mixed density atherosclerosis. 3. Normal CT perfusion.  Assessment: 74 y.o. male presenting after seizure-like activity at home.  1. No seizure activity seen during ED assessment.  2. Exam reveals diffuse rigidity, including waxy rigidity, suggestive of a catatonic state. This would be consistent with his chronic bedbound state  of nearly complete passivity as described by his sister over the telephone.  3. Components of his exam, including unintelligible speech, right sided neglect and decreased right sided movement to noxious, are most likely secondary to postictal state. Of note, seizure-like motor activity described by sister as occurring at home involved his RUE, most consistent with a partial-complex seizure.   Recommendations: 1. Obtaining valproic acid level.  2. Continue scheduled Depakote ER 500 mg qAM.  3. As the patient missed his AM dose of Depakote today, will administer a supplemental IV load of 10 mg/kg x 1 now.  4. Seizure precautions.  5. EEG in AM (ordered).  6. May benefit from a trial of Ambien for possible catatonic state if he has not improved by tomorrow and if EEG shows no seizure recurrence.  7. IV Ativan 2 mg PRN seizure and call Neurology.   @Electronically  signed: Dr. Kerney Elbe  12/04/2020, 11:15 PM

## 2020-12-04 NOTE — ED Notes (Signed)
Pt labwork walked to mini lab

## 2020-12-05 ENCOUNTER — Other Ambulatory Visit: Payer: Self-pay

## 2020-12-05 ENCOUNTER — Observation Stay (HOSPITAL_COMMUNITY): Payer: Medicare Other

## 2020-12-05 DIAGNOSIS — G40919 Epilepsy, unspecified, intractable, without status epilepticus: Secondary | ICD-10-CM | POA: Diagnosis present

## 2020-12-05 DIAGNOSIS — F32A Depression, unspecified: Secondary | ICD-10-CM | POA: Diagnosis present

## 2020-12-05 DIAGNOSIS — R54 Age-related physical debility: Secondary | ICD-10-CM | POA: Diagnosis present

## 2020-12-05 DIAGNOSIS — E43 Unspecified severe protein-calorie malnutrition: Secondary | ICD-10-CM | POA: Diagnosis present

## 2020-12-05 DIAGNOSIS — G40909 Epilepsy, unspecified, not intractable, without status epilepticus: Secondary | ICD-10-CM | POA: Diagnosis present

## 2020-12-05 DIAGNOSIS — Z20822 Contact with and (suspected) exposure to covid-19: Secondary | ICD-10-CM | POA: Diagnosis present

## 2020-12-05 DIAGNOSIS — Z1611 Resistance to penicillins: Secondary | ICD-10-CM | POA: Diagnosis present

## 2020-12-05 DIAGNOSIS — M1990 Unspecified osteoarthritis, unspecified site: Secondary | ICD-10-CM | POA: Diagnosis present

## 2020-12-05 DIAGNOSIS — E119 Type 2 diabetes mellitus without complications: Secondary | ICD-10-CM | POA: Insufficient documentation

## 2020-12-05 DIAGNOSIS — Z66 Do not resuscitate: Secondary | ICD-10-CM | POA: Diagnosis not present

## 2020-12-05 DIAGNOSIS — R5381 Other malaise: Secondary | ICD-10-CM | POA: Diagnosis not present

## 2020-12-05 DIAGNOSIS — J9811 Atelectasis: Secondary | ICD-10-CM | POA: Diagnosis not present

## 2020-12-05 DIAGNOSIS — F05 Delirium due to known physiological condition: Secondary | ICD-10-CM | POA: Diagnosis not present

## 2020-12-05 DIAGNOSIS — D638 Anemia in other chronic diseases classified elsewhere: Secondary | ICD-10-CM | POA: Diagnosis present

## 2020-12-05 DIAGNOSIS — R1310 Dysphagia, unspecified: Secondary | ICD-10-CM | POA: Diagnosis not present

## 2020-12-05 DIAGNOSIS — E871 Hypo-osmolality and hyponatremia: Secondary | ICD-10-CM | POA: Diagnosis present

## 2020-12-05 DIAGNOSIS — Z515 Encounter for palliative care: Secondary | ICD-10-CM | POA: Diagnosis not present

## 2020-12-05 DIAGNOSIS — R64 Cachexia: Secondary | ICD-10-CM | POA: Diagnosis present

## 2020-12-05 DIAGNOSIS — F0390 Unspecified dementia without behavioral disturbance: Secondary | ICD-10-CM | POA: Diagnosis present

## 2020-12-05 DIAGNOSIS — R627 Adult failure to thrive: Secondary | ICD-10-CM | POA: Diagnosis not present

## 2020-12-05 DIAGNOSIS — R4701 Aphasia: Secondary | ICD-10-CM | POA: Diagnosis present

## 2020-12-05 DIAGNOSIS — R414 Neurologic neglect syndrome: Secondary | ICD-10-CM | POA: Diagnosis present

## 2020-12-05 DIAGNOSIS — I160 Hypertensive urgency: Secondary | ICD-10-CM | POA: Diagnosis present

## 2020-12-05 DIAGNOSIS — Z681 Body mass index (BMI) 19 or less, adult: Secondary | ICD-10-CM | POA: Diagnosis not present

## 2020-12-05 DIAGNOSIS — R471 Dysarthria and anarthria: Secondary | ICD-10-CM | POA: Diagnosis present

## 2020-12-05 DIAGNOSIS — E785 Hyperlipidemia, unspecified: Secondary | ICD-10-CM | POA: Diagnosis present

## 2020-12-05 DIAGNOSIS — I10 Essential (primary) hypertension: Secondary | ICD-10-CM | POA: Diagnosis present

## 2020-12-05 DIAGNOSIS — G8191 Hemiplegia, unspecified affecting right dominant side: Secondary | ICD-10-CM | POA: Diagnosis present

## 2020-12-05 DIAGNOSIS — L89152 Pressure ulcer of sacral region, stage 2: Secondary | ICD-10-CM | POA: Diagnosis present

## 2020-12-05 DIAGNOSIS — N390 Urinary tract infection, site not specified: Secondary | ICD-10-CM | POA: Diagnosis not present

## 2020-12-05 DIAGNOSIS — R569 Unspecified convulsions: Secondary | ICD-10-CM | POA: Diagnosis present

## 2020-12-05 LAB — URINALYSIS, ROUTINE W REFLEX MICROSCOPIC
Bacteria, UA: NONE SEEN
Bilirubin Urine: NEGATIVE
Glucose, UA: NEGATIVE mg/dL
Ketones, ur: 5 mg/dL — AB
Leukocytes,Ua: NEGATIVE
Nitrite: NEGATIVE
Protein, ur: NEGATIVE mg/dL
Specific Gravity, Urine: >1.046 — ABNORMAL HIGH (ref 1.005–1.030)
pH: 5 (ref 5.0–8.0)

## 2020-12-05 LAB — HEMOGLOBIN A1C
Hgb A1c MFr Bld: 4.3 % — ABNORMAL LOW (ref 4.8–5.6)
Mean Plasma Glucose: 76.71 mg/dL

## 2020-12-05 LAB — RAPID URINE DRUG SCREEN, HOSP PERFORMED
Amphetamines: NOT DETECTED
Barbiturates: NOT DETECTED
Benzodiazepines: NOT DETECTED
Cocaine: NOT DETECTED
Opiates: NOT DETECTED
Tetrahydrocannabinol: NOT DETECTED

## 2020-12-05 LAB — VALPROIC ACID LEVEL
Valproic Acid Lvl: 13 ug/mL — ABNORMAL LOW (ref 50.0–100.0)
Valproic Acid Lvl: 42 ug/mL — ABNORMAL LOW (ref 50.0–100.0)

## 2020-12-05 LAB — RESP PANEL BY RT-PCR (FLU A&B, COVID) ARPGX2
Influenza A by PCR: NEGATIVE
Influenza B by PCR: NEGATIVE
SARS Coronavirus 2 by RT PCR: NEGATIVE

## 2020-12-05 LAB — CBG MONITORING, ED: Glucose-Capillary: 86 mg/dL (ref 70–99)

## 2020-12-05 MED ORDER — HYDRALAZINE HCL 20 MG/ML IJ SOLN: 5.0000 mg | INTRAMUSCULAR | Status: DC | PRN

## 2020-12-05 MED ORDER — ACETAMINOPHEN 325 MG PO TABS
650.0000 mg | ORAL_TABLET | Freq: Four times a day (QID) | ORAL | Status: DC | PRN
  Administered 2020-12-14 – 2020-12-29 (×8): 650 mg via ORAL
  Filled 2020-12-05 (×13): qty 2

## 2020-12-05 MED ORDER — VALPROATE SODIUM 500 MG/5ML IV SOLN
250.0000 mg | Freq: Two times a day (BID) | INTRAVENOUS | Status: DC
  Administered 2020-12-05 – 2020-12-06 (×3): 250 mg via INTRAVENOUS
  Filled 2020-12-05 (×5): qty 2.5

## 2020-12-05 MED ORDER — LORAZEPAM 2 MG/ML IJ SOLN: 2.0000 mg | Freq: Once | INTRAMUSCULAR | Status: DC | PRN

## 2020-12-05 MED ORDER — ENOXAPARIN SODIUM 40 MG/0.4ML ~~LOC~~ SOLN
40.0000 mg | Freq: Every day | SUBCUTANEOUS | Status: DC
  Administered 2020-12-05 – 2020-12-21 (×17): 40 mg via SUBCUTANEOUS
  Filled 2020-12-05 (×17): qty 0.4

## 2020-12-05 MED ORDER — SODIUM CHLORIDE 0.9 % IV SOLN: 75.0000 mL/h | INTRAVENOUS | Status: DC

## 2020-12-05 MED ORDER — HYDRALAZINE HCL 20 MG/ML IJ SOLN
10.0000 mg | INTRAMUSCULAR | Status: DC | PRN
  Administered 2020-12-05 – 2020-12-17 (×9): 10 mg via INTRAVENOUS
  Filled 2020-12-05 (×9): qty 1

## 2020-12-05 MED ORDER — DIVALPROEX SODIUM ER 500 MG PO TB24
500.0000 mg | ORAL_TABLET | Freq: Every day | ORAL | Status: DC
  Filled 2020-12-05: qty 1

## 2020-12-05 MED ORDER — VALPROATE SODIUM 500 MG/5ML IV SOLN
600.0000 mg | Freq: Once | INTRAVENOUS | Status: AC
  Administered 2020-12-05: 01:00:00 600 mg via INTRAVENOUS
  Filled 2020-12-05: qty 6

## 2020-12-05 MED ORDER — ACETAMINOPHEN 325 MG PO TABS
650.0000 mg | ORAL_TABLET | Freq: Once | ORAL | Status: AC
  Administered 2020-12-05: 08:00:00 650 mg via ORAL

## 2020-12-05 MED ORDER — DEXTROSE IN LACTATED RINGERS 5 % IV SOLN: INTRAVENOUS | Status: DC

## 2020-12-05 NOTE — ED Notes (Signed)
RN notified about vitals 

## 2020-12-05 NOTE — Progress Notes (Signed)
PROGRESS NOTE  Brad [name]:[id number] DOB: 10-23-1946 DOA: 12/04/2020 PCP: [name], DO  Brief History   Brad [name] is a 74 y.o. male with medical history significant of prostate cancer, depression, type II diabetes, hypertension, hyperlipidemia, seizures, substance abuse presented to the ED via EMS for evaluation of altered mental status.  Per triage note, last known well at 7:45 AM this morning.  When the patient's sister went to check on him at 2130 he was just staring at her and not responding.  He was difficult to understand and unable to get out of the bed.  No history could be obtained from the patient as his speech is incomprehensible.  No family available at this time.  ED Course: Blood pressure elevated with systolic up to 767M.  Remainder of vital signs stable.  WBC 9.9, hemoglobin 11.7 (at baseline), platelet 206K.  Sodium 138, potassium 4.4, chloride 101, bicarb 22, BUN 20, creatinine 0.9, glucose 75.  Valproic acid level subtherapeutic at 13.  Screening SARS-CoV-2 PCR test and influenza panel pending.  UA pending.  Head CT negative for acute intracranial abnormality.  CTA head and neck negative for LVO or high-grade stenosis of the intracranial arteries.  Unchanged 60% stenosis of the proximal left internal carotid artery secondary to mixed density atherosclerosis.  Normal CT perfusion.  Patient was seen by neurology and after speaking to his family, patient's presentation was felt to be due to seizure-like activity at home.  Admitted for further work-up.  Patient was given IV valproate 600 mg.  The patient is being roomed in the ED pending availability of a bed upstairs.  Consultants   Neurology  Procedures   EEG  Antibiotics   Anti-infectives (From admission, onward)   None      Subjective  The patient is a cachectic and chronically ill appearing elderly man. He is complaining of a headache.  Objective   Vitals:  Vitals:   12/05/20 1100  12/05/20 1130  BP: (!) 154/84 (!) 164/81  Pulse:  84  Resp: 19 (!) 28  Temp:    SpO2:  100%   Exam:  Constitutional:   The patient is awake and alert. His speech is very dysarthric.No acute distress. He is elderly, weak and frail.  Respiratory:   No increased work of breathing.  No wheezes, rales, or rhonchi  No tactile fremitus Cardiovascular:   Regular rate and rhythm  No murmurs, ectopy, or gallups.  No lateral PMI. No thrills. Abdomen:   Abdomen is soft, non-tender, non-distended  No hernias, masses, or organomegaly  Normoactive bowel sounds.  Musculoskeletal:   No cyanosis, clubbing, or edema Skin:   No rashes, lesions, ulcers  palpation of skin: no induration or nodules Neurologic:   CN 2-12 intact  Sensation all 4 extremities intact Psychiatric:   I am unable to evaluate the patient due to his inability to cooperate with exam.  I have personally reviewed the following:   Today's Data   Vitals,  Imaging   CT Cerebral perfusion  CT Head  CTA head and neck  Cardiology Data   EKG  Echocardiogram  Scheduled Meds:  enoxaparin (LOVENOX) injection  40 mg Subcutaneous Daily   Continuous Infusions:  valproate sodium 250 mg (12/05/20 1224)    Principal Problem:   Seizure ([place]) Active Problems:   Hypertension   Hyperlipidemia   Diabetes ([place])   LOS: 0 days   A & P  Breakthrough seizure: Likely due to Depakote nonadherence.  Valproic acid level  subtherapeutic on labs.  Seen by neurology and after speaking to his family, neurology feels that his presentation is due to seizure-like activity at home.  Head CT negative for acute intracranial abnormality.  CTA head and neck negative for LVO or high-grade stenosis of the intracranial arteries.  Unchanged 60% stenosis of the proximal left internal carotid artery secondary to mixed density atherosclerosis.  Normal CT perfusion. Neurology following, appreciate recommendations.  Patient was  given loading dose IV valproate 600 mg in the ED.  Continue scheduled Depakote ER 500 mg every morning.  IV Ativan 2 mg as needed if there is recurrence of seizure-like activity.  Seizure precautions. EEG is pending.  Type II diabetes: Diabetes listed in the chart but patient is not on any medications.  A1c was 4.4 in 2018 and 4.6 in 2019; no recent values in the chart. Repeat A1c is 4.3. There is no evidence to support the diagnosis of DM II.   Hypertensive urgency: Blood pressure elevated with systolic above 710. IV hydralazine PRN SBP >170.  Resume home med(s) after pharmacy med rec is done and patient has passed a swallow eval.  History of substance abuse: UDS is negative.  Depression/Hyperlipidemia/History of prostate cancer: Unable to safely order home medications at this time as pharmacy med rec is pending and patient has not passed swallow eval.  I have seen and examined this patient myself. I have spent 35 minutes in his evaluation and care.  DVT prophylaxis: Lovenox Code Status: Full code Family Communication: No family available at this time. Disposition Plan: Status is: Observation  The patient remains OBS appropriate and will d/c before 2 midnights.  Dispo: The patient is from: Home  Anticipated d/c is to: Home  Anticipated d/c date is: 2 days  Patient currently is not medically stable to d/c.  Jassiah Viviano, DO [facility] Direct contact: see [email or web address]  7PM-7AM contact night coverage as above 12/05/2020, 12:30 PM  LOS: 0 days

## 2020-12-05 NOTE — ED Notes (Signed)
Pt's sister would like a callback with an update, branette [phone]

## 2020-12-05 NOTE — ED Notes (Signed)
Assumed care of patient. Patient alert with garbled speech which was reported from previous RN.Patient medicated as ordered. Warm blankets provided and lights turned down for comfort. Cardiac, bp, and pulse ox monitoring in place. Call bell in reach. Will continue to monitor.

## 2020-12-05 NOTE — ED Notes (Signed)
Pt appears to be more alert at this time. Pt responds to this RN with smile. Speech is mostly incomprehensible, however he did say "I'm alright". Unable to answer situational questions.

## 2020-12-05 NOTE — ED Notes (Signed)
Dr Benny Lennert secured messaged for IV fluids as pt is NPO

## 2020-12-05 NOTE — Progress Notes (Signed)
EEG complete - results pending 

## 2020-12-05 NOTE — ED Notes (Signed)
Notified MD in regards to patient's elevated temp. Will await further orders. Pt in NAD.  Resp even and unlabored. Call bell within reach.

## 2020-12-05 NOTE — ED Notes (Signed)
EEG being done at bedside.

## 2020-12-05 NOTE — ED Notes (Signed)
RN notified about pt vitals.

## 2020-12-05 NOTE — Procedures (Addendum)
Patient Name: Brad [name]  MRN: [id number]  Epilepsy Attending: Lora Havens  Referring Physician/Provider: Dr Derrick Ravel Date: 12/05/2020 Duration: 25.58 mins  Patient history: 74 y.o.malewith a PMHx of prostate cancer, anemia, arthritis, dementia, depression, DM, HTN, seizures on Depakote, and substance abuse who presents with acute onset of AMS and right side weakness. EEG to evaluate for seizure.  Level of alertness: Awake  AEDs during EEG study: VPA  Technical aspects: This EEG study was done with scalp electrodes positioned according to the 10-20 International system of electrode placement. Electrical activity was acquired at a sampling rate of 500Hz  and reviewed with a high frequency filter of 70Hz  and a low frequency filter of 1Hz . EEG data were recorded continuously and digitally stored.   Description: No posterior dominant rhythm was seen. EEG showed continuous generalized and maximal bilateral posterior quadrant 3 to 6 Hz theta-delta slowing. Spikes were also noted in left>right parieto-occipital region, at times periodic at 1.5-2hz  as well as rhythmic. Two seizures were noted at 1120 and 1131 during which patient was noted to have right arm rhythmic jerking, lasted about 22 and 24 seconds respectively. Concomitant eeg showed 2-3Hz  delta slowing in left>right parieto-occipital region which evolved into 5-6Hz  theta slowing.   Hyperventilation did not show any EEG change.  Physiology photic driving was not seen during photic stimulation.  Hyperventilation and photic stimulation were not performed.     ABNORMALITY - Focal Motor seizure, left>right parieto-occipital region -Continuous slow, generalized and maximal bilateral posterior quadrant -Spike,left>right parieto-occipital region  IMPRESSION: This study showed two focal motor seizure during which patient was noted to have right arm rhythmic twitching, arising from left>right parieto-occipital region at 1120 and  1131, lasted about 22 and 24 seconds respectively. Additionally, there was cortical dysfunction in bilateral posterior quadrant suggestive of underlying structural abnormality, PRES. There is also moderate diffuse encephalopathy, non specific to etiology.   Dr Theda Sers was notified.   [name]

## 2020-12-05 NOTE — ED Notes (Signed)
Cleaned patient of urinary and feces. Clean brief in place with clean lines. Pt in NAD.

## 2020-12-05 NOTE — ED Notes (Signed)
Attempted to perform NIH on patient but patient is too altered. Pt has garbled speech and upper extremities are trembling.

## 2020-12-05 NOTE — Progress Notes (Signed)
Neurology Progress Note   S:// Patient is lying in bed, leaning to left side, in no acute distress.  No family is present.  Patient will track, does not follow commands, has garbled unintelligible speech. Had a temp of 101.3 this am.  RN at bedside, states patient is not able to take PO meds. No seizure like activity noted by RN  O:// Current vital signs: BP (!) 160/92   Pulse 96   Temp 100.2 F (37.9 C) (Oral)   Resp (!) 21   Ht 6\' 1"  (1.854 m)   Wt 58.3 kg   SpO2 100%   BMI 16.96 kg/m  Vital signs in last 24 hours: Temp:  [97.8 F (36.6 C)-101.3 F (38.5 C)] 100.2 F (37.9 C) (12/19 0956) Pulse Rate:  [83-98] 96 (12/19 0800) Resp:  [15-21] 21 (12/19 0800) BP: (135-192)/(87-101) 160/92 (12/19 0800) SpO2:  [94 %-100 %] 100 % (12/19 0800) Weight:  [58.3 kg] 58.3 kg (12/18 2312)   ROS: Unable to obtain due to AMS  GENERAL: Awake, alert in no distress, frail gentleman HEENT: - Normocephalic and atraumatic, dry mm,  LUNGS - Clear to auscultation bilaterally with no wheezes CV - S1S2 RRR, no m/r/g, equal pulses bilaterally. ABDOMEN - Soft, nontender, nondistended with normoactive BS Ext: dry flaky skin with hypopigmentation throughout body, warm, well perfused, intact peripheral pulses, no edema  NEURO:  Mental Status: awake, alert, appears to have right side neglect. Does not follow commands, will track.  Attempts to speak when asked questions, however is garbled and unintelligible.   Language: speech is garbled and unintelligible.  Cranial Nerves: PERRL 54mm/brisk.  EOM not intact, has a left gaze preference, will track and cross midline  visual fields blinks to threat on left, right absent  facial asymmetry appears symmetric  facial sensation appears to be intact  hearing intact tongue/uvula/soft palate midline No evidence of tongue atrophy or fibrillations Motor: Right side appears to be weaker than left, withdraws to pain in all 4 extremities.  No spontaneous movement  of right lower leg noted  Tone: atrophy in all extremities  Sensation- responds to noxious stimuli  Coordination: not able to assess Gait- deferred, not able to assess, per records patient has been bed bound for some time    Medications  Current Facility-Administered Medications:  .  acetaminophen (TYLENOL) tablet 650 mg, 650 mg, Oral, Q6H PRN, Shela Leff, MD .  enoxaparin (LOVENOX) injection 40 mg, 40 mg, Subcutaneous, Daily, Rathore, Wandra Feinstein, MD .  hydrALAZINE (APRESOLINE) injection 10 mg, 10 mg, Intravenous, Q4H PRN, Shela Leff, MD, 10 mg at 12/05/20 0215 .  LORazepam (ATIVAN) injection 2 mg, 2 mg, Intravenous, Once PRN, Shela Leff, MD .  valproate (DEPACON) 250 mg in dextrose 5 % 50 mL IVPB, 250 mg, Intravenous, Q12H, Beulah Gandy A, NP  Current Outpatient Medications:  .  acetaminophen (TYLENOL) 500 MG tablet, Take 1,000 mg by mouth every 6 (six) hours as needed for moderate pain., Disp: , Rfl:  .  amLODipine (NORVASC) 2.5 MG tablet, Take 1 tablet (2.5 mg total) by mouth daily., Disp: 90 tablet, Rfl: 1 .  atorvastatin (LIPITOR) 20 MG tablet, Take 1 tablet (20 mg total) by mouth daily., Disp: 90 tablet, Rfl: 1 .  clotrimazole (LOTRIMIN) 1 % cream, Apply 1 application topically daily. , Disp: , Rfl:  .  diclofenac Sodium (VOLTAREN) 1 % GEL, Apply 2 g topically 4 (four) times daily as needed (pain). , Disp: , Rfl:  .  divalproex (DEPAKOTE ER) 500  MG 24 hr tablet, Take 1 tablet (500 mg total) by mouth daily., Disp: 90 tablet, Rfl: 3 .  FLUoxetine (PROZAC) 20 MG capsule, Take 2 capsules (40 mg total) by mouth daily. (Patient taking differently: Take 20 mg by mouth daily. ), Disp: 180 capsule, Rfl: 1 .  meloxicam (MOBIC) 15 MG tablet, Take 15 mg by mouth daily., Disp: , Rfl:  .  mirtazapine (REMERON) 15 MG tablet, Take 15 mg by mouth at bedtime., Disp: , Rfl:  .  tamsulosin (FLOMAX) 0.4 MG CAPS capsule, Take 0.4 mg by mouth daily., Disp: , Rfl:  Labs CBC     Component Value Date/Time   WBC 9.9 12/04/2020 2315   RBC 3.73 (L) 12/04/2020 2315   HGB 11.7 (L) 12/04/2020 2315   HGB 10.1 (L) 04/19/2018 1629   HCT 36.5 (L) 12/04/2020 2315   HCT 31.6 (L) 04/19/2018 1629   PLT 206 12/04/2020 2315   PLT 168 04/19/2018 1629   MCV 97.9 12/04/2020 2315   MCV 93.1 05/22/2018 1105   MCV 95 04/19/2018 1629   MCH 31.4 12/04/2020 2315   MCHC 32.1 12/04/2020 2315   RDW 12.3 12/04/2020 2315   RDW 13.3 04/19/2018 1629   LYMPHSABS 1.3 12/04/2020 2315   MONOABS 0.3 12/04/2020 2315   EOSABS 0.0 12/04/2020 2315   BASOSABS 0.0 12/04/2020 2315    CMP     Component Value Date/Time   NA 138 12/04/2020 2315   NA 142 04/19/2018 1629   K 4.4 12/04/2020 2315   CL 101 12/04/2020 2315   CO2 22 12/04/2020 2315   GLUCOSE 75 12/04/2020 2315   BUN 20 12/04/2020 2315   BUN 21 04/19/2018 1629   CREATININE 0.94 12/04/2020 2315   CALCIUM 9.1 12/04/2020 2315   PROT 7.0 12/04/2020 2315   PROT 6.9 04/19/2018 1629   ALBUMIN 3.6 12/04/2020 2315   ALBUMIN 4.2 04/19/2018 1629   AST 28 12/04/2020 2315   ALT 17 12/04/2020 2315   ALKPHOS 70 12/04/2020 2315   BILITOT 1.1 12/04/2020 2315   BILITOT 0.5 04/19/2018 1629   GFRNONAA >60 12/04/2020 2315   GFRAA >60 09/17/2020 1346    glycosylated hemoglobin  Lipid Panel     Component Value Date/Time   CHOL 109 04/19/2018 1629   TRIG 49 04/19/2018 1629   HDL 44 04/19/2018 1629   CHOLHDL 2.5 04/19/2018 1629   CHOLHDL 5.3 Ratio 06/11/2009 2129   VLDL 14 06/11/2009 2129   LDLCALC 55 04/19/2018 1629     Imaging I have reviewed images in epic and the results pertinent to this consultation are:  CT-scan of the brain- No acute abnormality  CTA head/Neck - No LVO, normal perfusion, unchanged 60% stenosis of the proximal left internal carotid artery secondary to mixed density atherosclerosis.   Assessment:  Brad [name] is an 74 y.o. male with a PMHx of prostate cancer, anemia, arthritis, dementia, depression, DM,  HTN, seizures on Depakote, and substance abuse who presents with acute onset of AMS and right side weakness. Per record was LKW @ 0730 and noted to be abnormal @ ~ 2000 by family member.  ~2130 patient was found in bed with right arm shaking and unable to communicate.  Seizure like activity lasted about 20 minutes per family.  EMS reports no seizure like activity, but endorses right side neglect.  PEr record he has been bedbound since 2018 On exam, No seizure activity noticed by myself. Possibly could still be in a postical state due to AMS, unintelligible  speech and right side neglect.  This could be breakthrough seizures due to missed doses of depakote, Valproic acid level is 13 and/or infection. Received a depakote load in the ED  Recommendations: - Check Valproic acid level now - change Depakote to IV as patient not able to take Po's currently due to AMS  - Seizure and aspiration precautions - EEG to be obtained today - tele - work up for infection - Call Neurology if concerns of seizure activity - Will follow up on EEG results and labs  Beulah Gandy, [place], [place] [facility] [phone]

## 2020-12-05 NOTE — H&P (Signed)
History and Physical    Brad [name] ZLD:[id number] DOB: January 07, 1946 DOA: 12/04/2020  PCP: [name], DO Patient coming from: Home  Chief Complaint: Altered mental status  HPI: Brad [name] is a 74 y.o. male with medical history significant of prostate cancer, depression, type II diabetes, hypertension, hyperlipidemia, seizures, substance abuse presented to the ED via EMS for evaluation of altered mental status.  Per triage note, last known well at 7:45 AM this morning.  When the patient's sister went to check on him at 2130 he was just staring at her and not responding.  He was difficult to understand and unable to get out of the bed.  No history could be obtained from the patient as his speech is incomprehensible.  No family available at this time.  ED Course: Blood pressure elevated with systolic up to 903E.  Remainder of vital signs stable.  WBC 9.9, hemoglobin 11.7 (at baseline), platelet 206K.  Sodium 138, potassium 4.4, chloride 101, bicarb 22, BUN 20, creatinine 0.9, glucose 75.  Valproic acid level subtherapeutic at 13.  Screening SARS-CoV-2 PCR test and influenza panel pending.  UA pending.  Head CT negative for acute intracranial abnormality.  CTA head and neck negative for LVO or high-grade stenosis of the intracranial arteries.  Unchanged 60% stenosis of the proximal left internal carotid artery secondary to mixed density atherosclerosis.  Normal CT perfusion.  Patient was seen by neurology and after speaking to his family, patient's presentation was felt to be due to seizure-like activity at home.  Admitted for further work-up.  Patient was given IV valproate 600 mg.  Review of Systems:  All systems reviewed and apart from history of presenting illness, are negative.  Past Medical History:  Diagnosis Date  . Anemia   . Arthritis   . Cancer [facility]) 2017   prostate  . Depression   . Diabetes mellitus without complication ([place])   . History of stomach ulcers   .  Hypertension   . Seizures ([place])   . Substance abuse [facility])     Past Surgical History:  Procedure Laterality Date  . herniated disc    . KNEE SURGERY Right      reports that he has quit smoking. His smoking use included cigarettes. He has never used smokeless tobacco. He reports previous alcohol use of about 2.0 - 3.0 standard drinks of alcohol per week. He reports previous drug use. Drug: Marijuana.  No Known Allergies  Family History  Problem Relation Age of Onset  . Seizures Neg Hx     Prior to Admission medications   Medication Sig Start Date End Date Taking? Authorizing Provider  acetaminophen (TYLENOL) 500 MG tablet Take 1,000 mg by mouth every 6 (six) hours as needed for moderate pain.    [provider]  amLODipine (NORVASC) 2.5 MG tablet Take 1 tablet (2.5 mg total) by mouth daily. 04/19/18   Tereasa Coop, PA-C  atorvastatin (LIPITOR) 20 MG tablet Take 1 tablet (20 mg total) by mouth daily. 04/19/18   Tereasa Coop, PA-C  clotrimazole (LOTRIMIN) 1 % cream Apply 1 application topically daily.  05/04/20   [provider]  diclofenac Sodium (VOLTAREN) 1 % GEL Apply 2 g topically 4 (four) times daily as needed (pain).  12/31/19   [provider]  divalproex (DEPAKOTE ER) 500 MG 24 hr tablet Take 1 tablet (500 mg total) by mouth daily. 06/10/20   Lomax, Amy, NP  FLUoxetine (PROZAC) 20 MG capsule Take 2 capsules (40  mg total) by mouth daily. Patient taking differently: Take 20 mg by mouth daily.  04/19/18   Tereasa Coop, PA-C  meloxicam (MOBIC) 15 MG tablet Take 15 mg by mouth daily. 12/31/19   [provider]  mirtazapine (REMERON) 15 MG tablet Take 15 mg by mouth at bedtime. 10/30/19   [provider]  tamsulosin (FLOMAX) 0.4 MG CAPS capsule Take 0.4 mg by mouth daily. 05/03/20   [provider]    Physical Exam: Vitals:   12/04/20 2312 12/04/20 2316 12/04/20 2349 12/05/20 0000  BP:    (!) 192/101  Pulse:    98  Resp:     17  Temp:   97.8 F (36.6 C)   TempSrc:   Rectal   SpO2:  94%  97%  Weight: 58.3 kg     Height:        Physical Exam Constitutional:      General: He is not in acute distress. HENT:     Head: Normocephalic and atraumatic.  Eyes:     General:        Right eye: No discharge.        Left eye: No discharge.     Conjunctiva/sclera: Conjunctivae normal.  Cardiovascular:     Rate and Rhythm: Normal rate and regular rhythm.     Pulses: Normal pulses.  Pulmonary:     Effort: Pulmonary effort is normal. No respiratory distress.     Breath sounds: Normal breath sounds. No wheezing or rales.  Abdominal:     General: Bowel sounds are normal. There is no distension.     Palpations: Abdomen is soft.     Tenderness: There is no abdominal tenderness.  Musculoskeletal:        General: No swelling or tenderness.     Cervical back: Normal range of motion and neck supple.  Skin:    General: Skin is warm and dry.  Neurological:     Mental Status: He is alert.     Comments: Awake and alert Speech incomprehensible Confused and not able to follow commands     Labs on Admission: I have personally reviewed following labs and imaging studies  CBC: Recent Labs  Lab 12/04/20 2314 12/04/20 2315  WBC  --  9.9  NEUTROABS  --  8.3*  HGB 12.9* 11.7*  HCT 38.0* 36.5*  MCV  --  97.9  PLT  --  656   Basic Metabolic Panel: Recent Labs  Lab 12/04/20 2314 12/04/20 2315  NA 140 138  K 4.5 4.4  CL 102 101  CO2  --  22  GLUCOSE 73 75  BUN 28* 20  CREATININE 0.90 0.94  CALCIUM  --  9.1   GFR: Estimated Creatinine Clearance: 57.7 mL/min (by C-G formula based on SCr of 0.94 mg/dL). Liver Function Tests: Recent Labs  Lab 12/04/20 2315  AST 28  ALT 17  ALKPHOS 70  BILITOT 1.1  PROT 7.0  ALBUMIN 3.6   No results for input(s): LIPASE, AMYLASE in the last 168 hours. No results for input(s): AMMONIA in the last 168 hours. Coagulation Profile: Recent Labs  Lab 12/04/20 2315  INR  1.2   Cardiac Enzymes: No results for input(s): CKTOTAL, CKMB, CKMBINDEX, TROPONINI in the last 168 hours. BNP (last 3 results) No results for input(s): PROBNP in the last 8760 hours. HbA1C: No results for input(s): HGBA1C in the last 72 hours. CBG: No results for input(s): GLUCAP in the last 168 hours. Lipid Profile: No  results for input(s): CHOL, HDL, LDLCALC, TRIG, CHOLHDL, LDLDIRECT in the last 72 hours. Thyroid Function Tests: No results for input(s): TSH, T4TOTAL, FREET4, T3FREE, THYROIDAB in the last 72 hours. Anemia Panel: No results for input(s): VITAMINB12, FOLATE, FERRITIN, TIBC, IRON, RETICCTPCT in the last 72 hours. Urine analysis:    Component Value Date/Time   COLORURINE STRAW (A) 12/05/2020 0106   APPEARANCEUR CLEAR 12/05/2020 0106   LABSPEC >1.046 (H) 12/05/2020 0106   PHURINE 5.0 12/05/2020 0106   GLUCOSEU NEGATIVE 12/05/2020 0106   HGBUR MODERATE (A) 12/05/2020 0106   HGBUR negative 05/29/2008 1402   BILIRUBINUR NEGATIVE 12/05/2020 0106   KETONESUR 5 (A) 12/05/2020 0106   PROTEINUR NEGATIVE 12/05/2020 0106   UROBILINOGEN 2.0 (H) 09/17/2020 1417   NITRITE NEGATIVE 12/05/2020 0106   LEUKOCYTESUR NEGATIVE 12/05/2020 0106    Radiological Exams on Admission: CT CEREBRAL PERFUSION W CONTRAST  Result Date: 12/04/2020 CLINICAL DATA:  Right-sided weakness EXAM: CT ANGIOGRAPHY HEAD AND NECK CT PERFUSION BRAIN TECHNIQUE: Multidetector CT imaging of the head and neck was performed using the standard protocol during bolus administration of intravenous contrast. Multiplanar CT image reconstructions and MIPs were obtained to evaluate the vascular anatomy. Carotid stenosis measurements (when applicable) are obtained utilizing NASCET criteria, using the distal internal carotid diameter as the denominator. Multiphase CT imaging of the brain was performed following IV bolus contrast injection. Subsequent parametric perfusion maps were calculated using RAPID software. CONTRAST:   120mL OMNIPAQUE IOHEXOL 350 MG/ML SOLN COMPARISON:  11/24/2017 CTA head neck FINDINGS: CTA NECK FINDINGS SKELETON: There is no bony spinal canal stenosis. No lytic or blastic lesion. OTHER NECK: Normal pharynx, larynx and major salivary glands. No cervical lymphadenopathy. Unremarkable thyroid gland. UPPER CHEST: No pneumothorax or pleural effusion. No nodules or masses. AORTIC ARCH: There is calcific atherosclerosis of the aortic arch. There is no aneurysm, dissection or hemodynamically significant stenosis of the visualized portion of the aorta. Conventional 3 vessel aortic branching pattern. The visualized proximal subclavian arteries are widely patent. RIGHT CAROTID SYSTEM: Normal without aneurysm, dissection or stenosis. LEFT CAROTID SYSTEM: No dissection, occlusion or aneurysm. There is mixed density atherosclerosis extending into the proximal ICA, resulting in unchanged 60% stenosis. VERTEBRAL ARTERIES: Codominant configuration. Both origins are clearly patent. There is no dissection, occlusion or flow-limiting stenosis to the skull base (V1-V3 segments). CTA HEAD FINDINGS POSTERIOR CIRCULATION: --Vertebral arteries: Normal V4 segments. --Inferior cerebellar arteries: Normal. --Basilar artery: Normal. --Superior cerebellar arteries: Normal. --Posterior cerebral arteries (PCA): Normal. ANTERIOR CIRCULATION: --Intracranial internal carotid arteries: Normal. --Anterior cerebral arteries (ACA): Normal. Both A1 segments are present. Patent anterior communicating artery (a-comm). --Middle cerebral arteries (MCA): Normal. VENOUS SINUSES: As permitted by contrast timing, patent. ANATOMIC VARIANTS: Fetal origins of both posterior cerebral arteries. Review of the MIP images confirms the above findings. CT Brain Perfusion Findings: ASPECTS: 10 CBF (<30%) Volume: 17mL Perfusion (Tmax>6.0s) volume: 3mL Mismatch Volume: 104mL Infarction Location:None IMPRESSION: 1. No emergent large vessel occlusion or high-grade stenosis of  the intracranial arteries. 2. Unchanged 60% stenosis of the proximal left internal carotid artery secondary to mixed density atherosclerosis. 3. Normal CT perfusion. Aortic Atherosclerosis (ICD10-I70.0). Electronically Signed   By: Ulyses Jarred M.D.   On: 12/04/2020 23:50   CT HEAD CODE STROKE WO CONTRAST  Result Date: 12/04/2020 CLINICAL DATA:  Code stroke.  Right-sided weakness EXAM: CT HEAD WITHOUT CONTRAST TECHNIQUE: Contiguous axial images were obtained from the base of the skull through the vertex without intravenous contrast. COMPARISON:  Head CT 05/29/2020 FINDINGS: Brain: There is no  mass, hemorrhage or extra-axial collection. There is generalized atrophy without lobar predilection. Hypodensity of the white matter is most commonly associated with chronic microvascular disease. Multiple old small vessel infarcts of the basal ganglia. Vascular: No abnormal hyperdensity of the major intracranial arteries or dural venous sinuses. No intracranial atherosclerosis. Skull: The visualized skull base, calvarium and extracranial soft tissues are normal. Sinuses/Orbits: No fluid levels or advanced mucosal thickening of the visualized paranasal sinuses. No mastoid or middle ear effusion. The orbits are normal. ASPECTS (Logan Stroke Program Early CT Score) - Ganglionic level infarction (caudate, lentiform nuclei, internal capsule, insula, M1-M3 cortex): 7 - Supraganglionic infarction (M4-M6 cortex): 3 Total score (0-10 with 10 being normal): 10 Review of the MIP images confirms the above findings IMPRESSION: 1. No acute intracranial abnormality. 2. ASPECTS is 10. These results were communicated to Dr. Kerney Elbe at 11:23 pm on 12/04/2020 by text page via the [facility] messaging system. Electronically Signed   By: Ulyses Jarred M.D.   On: 12/04/2020 23:23   CT ANGIO HEAD CODE STROKE  Result Date: 12/04/2020 CLINICAL DATA:  Right-sided weakness EXAM: CT ANGIOGRAPHY HEAD AND NECK CT PERFUSION BRAIN TECHNIQUE:  Multidetector CT imaging of the head and neck was performed using the standard protocol during bolus administration of intravenous contrast. Multiplanar CT image reconstructions and MIPs were obtained to evaluate the vascular anatomy. Carotid stenosis measurements (when applicable) are obtained utilizing NASCET criteria, using the distal internal carotid diameter as the denominator. Multiphase CT imaging of the brain was performed following IV bolus contrast injection. Subsequent parametric perfusion maps were calculated using RAPID software. CONTRAST:  1101mL OMNIPAQUE IOHEXOL 350 MG/ML SOLN COMPARISON:  11/24/2017 CTA head neck FINDINGS: CTA NECK FINDINGS SKELETON: There is no bony spinal canal stenosis. No lytic or blastic lesion. OTHER NECK: Normal pharynx, larynx and major salivary glands. No cervical lymphadenopathy. Unremarkable thyroid gland. UPPER CHEST: No pneumothorax or pleural effusion. No nodules or masses. AORTIC ARCH: There is calcific atherosclerosis of the aortic arch. There is no aneurysm, dissection or hemodynamically significant stenosis of the visualized portion of the aorta. Conventional 3 vessel aortic branching pattern. The visualized proximal subclavian arteries are widely patent. RIGHT CAROTID SYSTEM: Normal without aneurysm, dissection or stenosis. LEFT CAROTID SYSTEM: No dissection, occlusion or aneurysm. There is mixed density atherosclerosis extending into the proximal ICA, resulting in unchanged 60% stenosis. VERTEBRAL ARTERIES: Codominant configuration. Both origins are clearly patent. There is no dissection, occlusion or flow-limiting stenosis to the skull base (V1-V3 segments). CTA HEAD FINDINGS POSTERIOR CIRCULATION: --Vertebral arteries: Normal V4 segments. --Inferior cerebellar arteries: Normal. --Basilar artery: Normal. --Superior cerebellar arteries: Normal. --Posterior cerebral arteries (PCA): Normal. ANTERIOR CIRCULATION: --Intracranial internal carotid arteries: Normal.  --Anterior cerebral arteries (ACA): Normal. Both A1 segments are present. Patent anterior communicating artery (a-comm). --Middle cerebral arteries (MCA): Normal. VENOUS SINUSES: As permitted by contrast timing, patent. ANATOMIC VARIANTS: Fetal origins of both posterior cerebral arteries. Review of the MIP images confirms the above findings. CT Brain Perfusion Findings: ASPECTS: 10 CBF (<30%) Volume: 83mL Perfusion (Tmax>6.0s) volume: 36mL Mismatch Volume: 57mL Infarction Location:None IMPRESSION: 1. No emergent large vessel occlusion or high-grade stenosis of the intracranial arteries. 2. Unchanged 60% stenosis of the proximal left internal carotid artery secondary to mixed density atherosclerosis. 3. Normal CT perfusion. Aortic Atherosclerosis (ICD10-I70.0). Electronically Signed   By: Ulyses Jarred M.D.   On: 12/04/2020 23:50   CT ANGIO NECK CODE STROKE  Result Date: 12/04/2020 CLINICAL DATA:  Right-sided weakness EXAM: CT ANGIOGRAPHY HEAD AND NECK CT PERFUSION  BRAIN TECHNIQUE: Multidetector CT imaging of the head and neck was performed using the standard protocol during bolus administration of intravenous contrast. Multiplanar CT image reconstructions and MIPs were obtained to evaluate the vascular anatomy. Carotid stenosis measurements (when applicable) are obtained utilizing NASCET criteria, using the distal internal carotid diameter as the denominator. Multiphase CT imaging of the brain was performed following IV bolus contrast injection. Subsequent parametric perfusion maps were calculated using RAPID software. CONTRAST:  172mL OMNIPAQUE IOHEXOL 350 MG/ML SOLN COMPARISON:  11/24/2017 CTA head neck FINDINGS: CTA NECK FINDINGS SKELETON: There is no bony spinal canal stenosis. No lytic or blastic lesion. OTHER NECK: Normal pharynx, larynx and major salivary glands. No cervical lymphadenopathy. Unremarkable thyroid gland. UPPER CHEST: No pneumothorax or pleural effusion. No nodules or masses. AORTIC ARCH:  There is calcific atherosclerosis of the aortic arch. There is no aneurysm, dissection or hemodynamically significant stenosis of the visualized portion of the aorta. Conventional 3 vessel aortic branching pattern. The visualized proximal subclavian arteries are widely patent. RIGHT CAROTID SYSTEM: Normal without aneurysm, dissection or stenosis. LEFT CAROTID SYSTEM: No dissection, occlusion or aneurysm. There is mixed density atherosclerosis extending into the proximal ICA, resulting in unchanged 60% stenosis. VERTEBRAL ARTERIES: Codominant configuration. Both origins are clearly patent. There is no dissection, occlusion or flow-limiting stenosis to the skull base (V1-V3 segments). CTA HEAD FINDINGS POSTERIOR CIRCULATION: --Vertebral arteries: Normal V4 segments. --Inferior cerebellar arteries: Normal. --Basilar artery: Normal. --Superior cerebellar arteries: Normal. --Posterior cerebral arteries (PCA): Normal. ANTERIOR CIRCULATION: --Intracranial internal carotid arteries: Normal. --Anterior cerebral arteries (ACA): Normal. Both A1 segments are present. Patent anterior communicating artery (a-comm). --Middle cerebral arteries (MCA): Normal. VENOUS SINUSES: As permitted by contrast timing, patent. ANATOMIC VARIANTS: Fetal origins of both posterior cerebral arteries. Review of the MIP images confirms the above findings. CT Brain Perfusion Findings: ASPECTS: 10 CBF (<30%) Volume: 9mL Perfusion (Tmax>6.0s) volume: 25mL Mismatch Volume: 62mL Infarction Location:None IMPRESSION: 1. No emergent large vessel occlusion or high-grade stenosis of the intracranial arteries. 2. Unchanged 60% stenosis of the proximal left internal carotid artery secondary to mixed density atherosclerosis. 3. Normal CT perfusion. Aortic Atherosclerosis (ICD10-I70.0). Electronically Signed   By: Ulyses Jarred M.D.   On: 12/04/2020 23:50    EKG: Independently reviewed.  Sinus rhythm, QTC 491, artifact in anterior  leads.  Assessment/Plan Principal Problem:   Seizure ([place]) Active Problems:   Hypertension   Hyperlipidemia   Diabetes ([place])   Breakthrough seizure: Likely due to Depakote nonadherence.  Valproic acid level subtherapeutic on labs.  Seen by neurology and after speaking to his family, neurology feels that his presentation is due to seizure-like activity at home.  Head CT negative for acute intracranial abnormality.  CTA head and neck negative for LVO or high-grade stenosis of the intracranial arteries.  Unchanged 60% stenosis of the proximal left internal carotid artery secondary to mixed density atherosclerosis.  Normal CT perfusion. -Neurology following, appreciate recommendations.  Patient was given loading dose IV valproate 600 mg in the ED.  Continue scheduled Depakote ER 500 mg every morning.  IV Ativan 2 mg as needed if there is recurrence of seizure-like activity.  Seizure precautions.  Routine EEG in the morning.   ?Type II diabetes: Diabetes listed in the chart but patient is not on any medications.  A1c was 4.4 in 2018 and 4.6 in 2019; no recent values in the chart. -Repeat A1c  Hypertensive urgency: Blood pressure elevated with systolic above 025. -IV hydralazine PRN SBP >170.  Resume home med(s) after pharmacy  med rec is done.  History of substance abuse -UDS  Depression Hyperlipidemia History of prostate cancer -Unable to safely order home medications at this time as pharmacy med rec is pending.  DVT prophylaxis: Lovenox Code Status: Full code Family Communication: No family available at this time. Disposition Plan: Status is: Observation  The patient remains OBS appropriate and will d/c before 2 midnights.  Dispo: The patient is from: Home              Anticipated d/c is to: Home              Anticipated d/c date is: 2 days              Patient currently is not medically stable to d/c.  The medical decision making on this patient was of high complexity and the  patient is at high risk for clinical deterioration, therefore this is a level 3 visit.  Shela Leff MD [facility]  If 7PM-7AM, please contact night-coverage [email or web address]  12/05/2020, 1:20 AM

## 2020-12-06 DIAGNOSIS — L8990 Pressure ulcer of unspecified site, unspecified stage: Secondary | ICD-10-CM | POA: Diagnosis present

## 2020-12-06 LAB — CBC WITH DIFFERENTIAL/PLATELET
Abs Immature Granulocytes: 0.05 10*3/uL (ref 0.00–0.07)
Basophils Absolute: 0 10*3/uL (ref 0.0–0.1)
Basophils Relative: 0 %
Eosinophils Absolute: 0 10*3/uL (ref 0.0–0.5)
Eosinophils Relative: 0 %
HCT: 33.2 % — ABNORMAL LOW (ref 39.0–52.0)
Hemoglobin: 10.7 g/dL — ABNORMAL LOW (ref 13.0–17.0)
Immature Granulocytes: 0 %
Lymphocytes Relative: 15 %
Lymphs Abs: 1.7 10*3/uL (ref 0.7–4.0)
MCH: 30.5 pg (ref 26.0–34.0)
MCHC: 32.2 g/dL (ref 30.0–36.0)
MCV: 94.6 fL (ref 80.0–100.0)
Monocytes Absolute: 0.7 10*3/uL (ref 0.1–1.0)
Monocytes Relative: 6 %
Neutro Abs: 9.4 10*3/uL — ABNORMAL HIGH (ref 1.7–7.7)
Neutrophils Relative %: 79 %
Platelets: 182 10*3/uL (ref 150–400)
RBC: 3.51 MIL/uL — ABNORMAL LOW (ref 4.22–5.81)
RDW: 12.2 % (ref 11.5–15.5)
WBC: 11.9 10*3/uL — ABNORMAL HIGH (ref 4.0–10.5)
nRBC: 0 % (ref 0.0–0.2)

## 2020-12-06 LAB — GLUCOSE, CAPILLARY: Glucose-Capillary: 73 mg/dL (ref 70–99)

## 2020-12-06 LAB — BASIC METABOLIC PANEL
Anion gap: 11 (ref 5–15)
BUN: 15 mg/dL (ref 8–23)
CO2: 28 mmol/L (ref 22–32)
Calcium: 8.9 mg/dL (ref 8.9–10.3)
Chloride: 100 mmol/L (ref 98–111)
Creatinine, Ser: 0.94 mg/dL (ref 0.61–1.24)
GFR, Estimated: >60 mL/min (ref >60)
Glucose, Bld: 128 mg/dL — ABNORMAL HIGH (ref 70–99)
Potassium: 3.7 mmol/L (ref 3.5–5.1)
Sodium: 139 mmol/L (ref 135–145)

## 2020-12-06 LAB — VALPROIC ACID LEVEL: Valproic Acid Lvl: 49 ug/mL — ABNORMAL LOW (ref 50.0–100.0)

## 2020-12-06 LAB — AMMONIA: Ammonia: 32 umol/L (ref 9–35)

## 2020-12-06 MED ORDER — LUBRIDERM SERIOUSLY SENSITIVE EX LOTN
TOPICAL_LOTION | Freq: Every day | CUTANEOUS | Status: DC
  Administered 2020-12-12 – 2020-12-20 (×4): 1 via TOPICAL
  Filled 2020-12-06: qty 473

## 2020-12-06 MED ORDER — VALPROATE SODIUM 500 MG/5ML IV SOLN
250.0000 mg | Freq: Three times a day (TID) | INTRAVENOUS | Status: DC
  Administered 2020-12-06 – 2020-12-08 (×5): 250 mg via INTRAVENOUS
  Filled 2020-12-06 (×7): qty 2.5

## 2020-12-06 MED ORDER — NIVEA EX CREA
TOPICAL_CREAM | Freq: Every day | CUTANEOUS | Status: DC
  Filled 2020-12-06 (×2): qty 120

## 2020-12-06 NOTE — Plan of Care (Signed)
  Problem: Safety: Goal: Ability to remain free from injury will improve Outcome: Progressing   

## 2020-12-06 NOTE — Progress Notes (Signed)
Subjective: patient laying in bed, appears frail, awake, no acute distress (per chart review bed bound since 2018). No visitors or family present at bedside. Patient will track voice with eyes intermittently. Does not follow commands. Unintelligible speech.   Objective:  Blood pressure 134/80, pulse 85, temperature 100.2 F (37.9 C), temperature source Oral, resp. rate 19, height 6\' 1"  (1.854 m), weight 58.3 kg, SpO2 100 %. Exam: Vitals:   12/06/20 0903 12/06/20 1211  BP: (!) 157/72 134/80  Pulse: 82 85  Resp: 20 19  Temp: 98.4 F (36.9 C) 100.2 F (37.9 C)  SpO2: 98% 100%   ROS: unable to assesses due to AMS  Gen: In bed, NAD, frail  HENT: normocephalic, atraumatic, dry mm Resp: non-labored breathing, no acute distress Abd: soft, non-distended  Neuro: MS: awake, in bed leaning to the left side. Does not follow commands. Unintelligible speech/mumbles. Only intermittently tracks with eyes.  CN: PERRLA 74mm --> 88mm, brisk. EOMI will intermittently track examiner to voice and cross midline, prefers left gaze. Blink to threat present in the left, absent in the right.  Face symmetric at rest. Unable to assess hearing, tongue protrusion, uvula rise, shoulder shrug due to patient not following commands. Motor: Left > right mildly in upper and lower extremities. Right hand with traction grip, left hand with magnetic grip. Moves arms spontaneously. Bilateral lower extremities unable to overcome gravity. Bulk: poor in all extremities.  Sensory: Responds to noxious stimuli in all extremities DTR: 2+ bicep reflexes bilaterally  Pertinent Labs: WBC 11.9, Valproic Acid level 49 ;subtherapeutic  Impression:  NEWMAN WAREN an 74 y.o.malewith a PMHx of prostate cancer, anemia, arthritis, dementia, depression, DM, HTN, seizures on Depakote, and substance abuse who presented to [facility] 12/19 with acute onset of AMS and right side weakness. Per record was LKW @ 0730 and noted to be abnormal @ ~  2000 by family member.  ~2130 patient was found in bed with right arm shaking and unable to communicate.  Seizure like activity lasted about 20 minutes per family. EMS reports no seizure like activity, but endorses right side neglect. Per record he has been bedbound since 2018  EEG 12/19 IMPRESSION: This study showed two focal motor seizure during which patient was noted to have right arm rhythmic twitching, arising from left>right parieto-occipital region at 1120 and 1131, lasted about 22 and 24 seconds respectively. Additionally, there was cortical dysfunction in bilateral posterior quadrant suggestive of underlying structural abnormality, PRES. There is also moderate diffuse encephalopathy, non specific to etiology.   Recommendations: - With EEG evidence of seizures and subacute valproic acid levels, increased Depakote to 250 mg TID. (Received loading dose in the ED)  - Not tolerating PO intake, continue IV AED and transition to PO  when tolerating PO.   - Speech therapy consult prior to PO. - Seizure and aspiration precautions - Telemetry monitoring  - Would recommend Palliative care consult for goals of care   Anibal Henderson, AGACNP-BC [facility] [phone]  I saw this patient with the APP on 12/06/20 and agree with the history and examination findings as documented with the following clarifications.  Patient completely unable to give a history, to participate in exam. I reviewed the available laboratory data and neuroimages, and other relevant tests/notes/procedures.  My examination findings include frontal release findings (grasp, Meyerson's sign). Pulls/pushes me away when I attempt to examine him. Makes eye contact. Moves both upper extremities against gravity, but not able to cooperate well enough to do formal muscle strength  grading.   Our impression at this time is advanced dementia and debility with seizures and subtherapeutic valproic acid level. Our recommendations  are to raise the depakote dose slightly (to 250 mg tid). Also, goals of care discussion seems appropriate given the advanced stage of his dementia at this time. Reviewing his chart suggest low level function at baseline, even before this decline in his status.  Thank you.  Perfecto Kingdom, MD

## 2020-12-06 NOTE — Progress Notes (Signed)
PROGRESS NOTE  Brad [name] NTI:[id number] DOB: 06/20/1946 DOA: 12/04/2020 PCP: [name], DO  Brief History   Brad [name] is a 74 y.o. male with medical history significant of prostate cancer, depression, type II diabetes, hypertension, hyperlipidemia, seizures, substance abuse presented to the ED via EMS for evaluation of altered mental status.  Per triage note, last known well at 7:45 AM this morning.  When the patient's sister went to check on him at 2130 he was just staring at her and not responding.  He was difficult to understand and unable to get out of the bed.  No history could be obtained from the patient as his speech is incomprehensible.  No family available at this time.  ED Course: Blood pressure elevated with systolic up to 867Y.  Remainder of vital signs stable.  WBC 9.9, hemoglobin 11.7 (at baseline), platelet 206K.  Sodium 138, potassium 4.4, chloride 101, bicarb 22, BUN 20, creatinine 0.9, glucose 75.  Valproic acid level subtherapeutic at 13.  Screening SARS-CoV-2 PCR test and influenza panel pending.  UA pending.  Head CT negative for acute intracranial abnormality.  CTA head and neck negative for LVO or high-grade stenosis of the intracranial arteries.  Unchanged 60% stenosis of the proximal left internal carotid artery secondary to mixed density atherosclerosis.  Normal CT perfusion.  Patient was seen by neurology and after speaking to his family, patient's presentation was felt to be due to seizure-like activity at home.  Admitted for further work-up.  Patient was given IV valproate 250 mg bid.  The patient is being roomed in the ED pending availability of a bed upstairs.  Consultants  . Neurology  Procedures  . EEG  Antibiotics   Anti-infectives (From admission, onward)   None     Subjective  The patient is a cachectic and chronically ill appearing elderly man.   Objective   Vitals:  Vitals:   12/06/20 0903 12/06/20 1211  BP: (!) 157/72  134/80  Pulse: 82 85  Resp: 20 19  Temp: 98.4 F (36.9 C) 100.2 F (37.9 C)  SpO2: 98% 100%   Exam:  Constitutional:  . The patient is awake and alert. His speech is very dysarthric.No acute distress. He is elderly, weak and frail.  Respiratory:  . No increased work of breathing. . No wheezes, rales, or rhonchi . No tactile fremitus Cardiovascular:  . Regular rate and rhythm . No murmurs, ectopy, or gallups. . No lateral PMI. No thrills. Abdomen:  . Abdomen is soft, non-tender, non-distended . No hernias, masses, or organomegaly . Normoactive bowel sounds.  Musculoskeletal:  . No cyanosis, clubbing, or edema Skin:  . No rashes, lesions, ulcers . palpation of skin: no induration or nodules Neurologic:  . CN 2-12 intact . Sensation all 4 extremities intact Psychiatric:  . I am unable to evaluate the patient due to his inability to cooperate with exam.  I have personally reviewed the following:   Today's Data  . Vitals,BMP  Imaging  . CT Cerebral perfusion . CT Head . CTA head and neck  Cardiology Data  . EKG . Echocardiogram  EEG: positive for seizures  Scheduled Meds: . enoxaparin (LOVENOX) injection  40 mg Subcutaneous Daily  . nivea   Topical Daily   Continuous Infusions: . dextrose 5% lactated ringers 100 mL/hr at 12/06/20 1543  . valproate sodium 250 mg (12/06/20 1034)    Principal Problem:   Seizure ([place]) Active Problems:   Hypertension   Hyperlipidemia   Diabetes ([place])  Breakthrough seizure ([place])   Pressure injury of skin   LOS: 1 day   A & P  Breakthrough seizure: Likely due to Depakote nonadherence.  Valproic acid level subtherapeutic on labs.  Seen by neurology and after speaking to his family, neurology feels that his presentation is due to seizure-like activity at home.  Head CT negative for acute intracranial abnormality.  CTA head and neck negative for LVO or high-grade stenosis of the intracranial arteries.  Unchanged 60% stenosis  of the proximal left internal carotid artery secondary to mixed density atherosclerosis.  Normal CT perfusion. Neurology following, appreciate recommendations.  Patient was given loading dose IV valproate 600 mg in the ED.  Continue scheduled Depakote 250 bid.  IV Ativan 2 mg as needed if there is recurrence of seizure-like activity.  Seizure precautions. EEG is positive for seizures. Depakote level is at 49. Lower limits of normal is 50. Await neurology eval and plan.  Dysarthria/dysphagia: Pt remains NPO. SLP eval is pending. Per conversation with the patient's sister, his speech is ordinarily easily understood. The patient has been evaluated by SLP. They believe that the patient's slowed mental status is affecting his swallow. They recommend continued NPO with oral meds being administered crushed in puree.   Type II diabetes: Diabetes listed in the chart but patient is not on any medications.  A1c was 4.4 in 2018 and 4.6 in 2019; no recent values in the chart. Repeat A1c is 4.3. There is no evidence to support the diagnosis of DM II.   Hypertensive urgency: Blood pressure improved with IV hydralazine.  Will resume home meds as crushed in puree as recommended by SLP.  History of substance abuse: UDS is negative.  Depression/Hyperlipidemia/History of prostate cancer: Unable to safely order home medications at this time as pharmacy med rec is pending and patient has not passed swallow eval.  I have seen and examined this patient myself. I have spent 42 minutes in his evaluation and care. More than 50% of this was spent in counseling with the patient's sister [name]. All questions answered to the best of my ability.  DVT prophylaxis: Lovenox Code Status: Full code Family Communication: Discussed with patient sister, Branette. Disposition Plan:  Status is: Inpatient  Remains inpatient appropriate because:Inpatient level of care appropriate due to severity of illness  Dispo: The patient is  from: SNF              Anticipated d/c is to: SNF              Anticipated d/c date is: 2 days              Patient currently is not medically stable to d/c.        Nashaly Dorantes, DO [facility] Direct contact: see [email or web address]  7PM-7AM contact night coverage as above 12/05/2020, 12:30 PM  LOS: 0 days

## 2020-12-06 NOTE — Evaluation (Signed)
Clinical/Bedside Swallow Evaluation Patient Details  Name: Brad [name] MRN: [id number] Date of Birth: May 15, 1946  Today's Date: 12/06/2020 Time: SLP Start Time (ACUTE ONLY): 1054 SLP Stop Time (ACUTE ONLY): 1106 SLP Time Calculation (min) (ACUTE ONLY): 12 min  Past Medical History:  Past Medical History:  Diagnosis Date  . Anemia   . Arthritis   . Cancer [facility]) 2017   prostate  . Depression   . Diabetes mellitus without complication ([place])   . History of stomach ulcers   . Hypertension   . Seizures ([place])   . Substance abuse [facility])    Past Surgical History:  Past Surgical History:  Procedure Laterality Date  . herniated disc    . KNEE SURGERY Right    HPI:  Pt is a 74 yo male presenting with AMS. CTH/CTA negative for acute findings; MRI pending. Per MD, there is concern for breakthrough seizure due to missed medications. PMH includes: prostate ca, anemia, arthritis, dementia, depression, DM, HTN, seizures, substance abuse. Previous MBS in June 2020 revealed normal oropharyngeal swallow but with pt c/o globus sensation. Esophagram the same day revealed moderate esophageal dysmotility.   Assessment / Plan / Recommendation Clinical Impression  Pt's swallowing function is impacted by current mentation, although uncertain how this compares to his baseline. Overall he has reduced awareness and slow oral motor movements. Oral holding is observed, but even when he is actively manipulating boluses, it can take some time. No overt s/s of aspiration are noted during PO trials but pt does sound mildly wet at baseline with throat clearing that does not seem to clear this. Recommend starting meds crushed in puree and a few sips of water at a time after oral care is performed. Will f/u to see if mentation improves. SLP Visit Diagnosis: Dysphagia, unspecified (R13.10)    Aspiration Risk  Moderate aspiration risk;Mild aspiration risk;Risk for inadequate nutrition/hydration    Diet  Recommendation NPO except meds (small sips of water after oral care)   Liquid Administration via: Spoon;Straw Medication Administration: Crushed with puree Supervision: Full supervision/cueing for compensatory strategies    Other  Recommendations Oral Care Recommendations: Oral care QID   Follow up Recommendations  (tba)      Frequency and Duration min 2x/week  2 weeks       Prognosis Prognosis for Safe Diet Advancement: Good Barriers to Reach Goals: Cognitive deficits      Swallow Study   General HPI: Pt is a 74 yo male presenting with AMS. CTH/CTA negative for acute findings; MRI pending. Per MD, there is concern for breakthrough seizure due to missed medications. PMH includes: prostate ca, anemia, arthritis, dementia, depression, DM, HTN, seizures, substance abuse. Previous MBS in June 2020 revealed normal oropharyngeal swallow but with pt c/o globus sensation. Esophagram the same day revealed moderate esophageal dysmotility. Type of Study: Bedside Swallow Evaluation Previous Swallow Assessment: see HPI Diet Prior to this Study: NPO Temperature Spikes Noted: Yes (100.2) Respiratory Status: Room air History of Recent Intubation: No Behavior/Cognition: Alert;Doesn't follow directions Oral Cavity Assessment: Other (comment) (white coating on tongue) Oral Care Completed by SLP: No Oral Cavity - Dentition: Edentulous Self-Feeding Abilities: Total assist Patient Positioning: Upright in bed Baseline Vocal Quality: Normal Volitional Cough: Cognitively unable to elicit Volitional Swallow: Unable to elicit    Oral/Motor/Sensory Function Overall Oral Motor/Sensory Function:  (not following commands for assessment)   Ice Chips Ice chips: Impaired Presentation: Spoon Oral Phase Functional Implications: Oral holding   Thin Liquid Thin Liquid: Impaired Presentation: Spoon;Straw  Oral Phase Impairments: Poor awareness of bolus Oral Phase Functional Implications: Oral holding     Nectar Thick Nectar Thick Liquid: Not tested   Honey Thick Honey Thick Liquid: Not tested   Puree Puree: Impaired Presentation: Spoon Oral Phase Impairments: Poor awareness of bolus Oral Phase Functional Implications: Prolonged oral transit   Solid     Solid: Not tested      Osie Bond., M.A. [place] [facility] Pager [phone] Office [phone]  12/06/2020,11:13 AM

## 2020-12-07 DIAGNOSIS — R5381 Other malaise: Secondary | ICD-10-CM

## 2020-12-07 DIAGNOSIS — Z66 Do not resuscitate: Secondary | ICD-10-CM

## 2020-12-07 DIAGNOSIS — Z7189 Other specified counseling: Secondary | ICD-10-CM

## 2020-12-07 DIAGNOSIS — Z515 Encounter for palliative care: Secondary | ICD-10-CM

## 2020-12-07 DIAGNOSIS — R627 Adult failure to thrive: Secondary | ICD-10-CM

## 2020-12-07 MED ORDER — TEMAZEPAM 7.5 MG PO CAPS
7.5000 mg | ORAL_CAPSULE | Freq: Every evening | ORAL | Status: DC | PRN
  Filled 2020-12-07: qty 1

## 2020-12-07 MED ORDER — LORAZEPAM 1 MG PO TABS: 1.0000 mg | ORAL_TABLET | Freq: Once | ORAL | Status: DC

## 2020-12-07 NOTE — Plan of Care (Signed)
Not progressing due to altered mental status and confusion.  Problem: Education: Goal: Knowledge of General Education information will improve Description: Including pain rating scale, medication(s)/side effects and non-pharmacologic comfort measures Outcome: Not Progressing   Problem: Health Behavior/Discharge Planning: Goal: Ability to manage health-related needs will improve Outcome: Not Progressing   Problem: Clinical Measurements: Goal: Ability to maintain clinical measurements within normal limits will improve Outcome: Not Progressing Goal: Will remain free from infection Outcome: Not Progressing Goal: Diagnostic test results will improve Outcome: Not Progressing Goal: Respiratory complications will improve Outcome: Not Progressing Goal: Cardiovascular complication will be avoided Outcome: Not Progressing   Problem: Activity: Goal: Risk for activity intolerance will decrease Outcome: Not Progressing   Problem: Nutrition: Goal: Adequate nutrition will be maintained Outcome: Not Progressing   Problem: Coping: Goal: Level of anxiety will decrease Outcome: Not Progressing   Problem: Elimination: Goal: Will not experience complications related to bowel motility Outcome: Not Progressing Goal: Will not experience complications related to urinary retention Outcome: Not Progressing

## 2020-12-07 NOTE — Evaluation (Signed)
Speech Language Pathology Evaluation Patient Details Name: Brad [name] MRN: [id number] DOB: Jun 04, 1946 Today's Date: 12/07/2020 Time: 7124-5809 SLP Time Calculation (min) (ACUTE ONLY): 9 min  Problem List:  Patient Active Problem List   Diagnosis Date Noted  . Pressure injury of skin 12/06/2020  . Seizure ([place]) 12/05/2020  . Diabetes ([place]) 12/05/2020  . Breakthrough seizure ([place]) 12/05/2020  . Cervical spinal stenosis 12/14/2017  . Abnormal CT scan, neck 12/14/2017  . History of stroke 12/12/2017  . Hyperlipidemia 12/12/2017  . Seizure disorder ([place]) 12/12/2017  . KNEE PAIN, RIGHT 06/11/2009  . NECK PAIN 06/11/2009  . Hypertension 02/13/2008  . DENTAL CARIES 02/11/2008   Past Medical History:  Past Medical History:  Diagnosis Date  . Anemia   . Arthritis   . Cancer [facility]) 2017   prostate  . Depression   . Diabetes mellitus without complication ([place])   . History of stomach ulcers   . Hypertension   . Seizures ([place])   . Substance abuse [facility])    Past Surgical History:  Past Surgical History:  Procedure Laterality Date  . herniated disc    . KNEE SURGERY Right    HPI:  Pt is a 74 yo male presenting with AMS. CTH/CTA negative for acute findings; MRI pending. Per MD, there is concern for breakthrough seizure due to missed medications. PMH includes: prostate ca, anemia, arthritis, dementia, depression, DM, HTN, seizures, substance abuse. Previous MBS in June 2020 revealed normal oropharyngeal swallow but with pt c/o globus sensation. Esophagram the same day revealed moderate esophageal dysmotility.   Assessment / Plan / Recommendation Clinical Impression  Pt has significant cognitive-linguistic impairments but with unclear baseline level of function. He does not follow simple commands or respond to questioning. He is producing more verbal output today but it is mostly incomprehensible other than the phrase "I don't know." His attention and awareness are greatly impacted.  Anticipate that pt will need 24/7 supervision upon discharge. SLP to follow as well.    SLP Assessment  SLP Recommendation/Assessment: Patient needs continued Speech Lanaguage Pathology Services SLP Visit Diagnosis: Cognitive communication deficit (R41.841)    Follow Up Recommendations  Skilled Nursing facility    Frequency and Duration min 2x/week  2 weeks      SLP Evaluation Cognition  Overall Cognitive Status: No family/caregiver present to determine baseline cognitive functioning Arousal/Alertness: Awake/alert Orientation Level:  (not able to answer) Attention: Sustained Sustained Attention: Impaired Sustained Attention Impairment: Functional basic Awareness: Impaired Awareness Impairment: Emergent impairment Problem Solving: Impaired Problem Solving Impairment: Functional basic Safety/Judgment: Impaired       Comprehension  Auditory Comprehension Overall Auditory Comprehension: Impaired Yes/No Questions: Impaired Basic Biographical Questions: 0-25% accurate Commands: Impaired One Step Basic Commands: 0-24% accurate    Expression Expression Primary Mode of Expression: Verbal Verbal Expression Overall Verbal Expression: Impaired Initiation: No impairment Automatic Speech:  ("i don't know") Level of Generative/Spontaneous Verbalization: Phrase ("I don't know") Repetition: Impaired Level of Impairment: Word level Naming: Impairment Confrontation: Impaired Verbal Errors: Phonemic paraphasias;Neologisms;Not aware of errors Pragmatics: Impairment Impairments: Eye contact Non-Verbal Means of Communication: Eye blink   Oral / Motor  Motor Speech Overall Motor Speech:  (limited assessment given expressive impairments)   GO                    Osie Bond., M.A. [place] [facility] Pager [phone] Office [phone]  12/07/2020, 11:19 AM

## 2020-12-07 NOTE — Progress Notes (Signed)
PROGRESS NOTE  TSUYOSHI WHARY [id number] DOB: 11/14/1946 DOA: 12/04/2020 PCP: [name], DO  Brief History   SORRELL APOSTLE is a 74 y.o. male with medical history significant of prostate cancer, depression, type II diabetes, hypertension, hyperlipidemia, seizures, substance abuse presented to the ED via EMS for evaluation of altered mental status.  Per triage note, last known well at 7:45 AM this morning.  When the patient's sister went to check on him at 2130 he was just staring at her and not responding.  He was difficult to understand and unable to get out of the bed.  No history could be obtained from the patient as his speech is incomprehensible.  No family available at this time.  ED Course: Blood pressure elevated with systolic up to [id number].  Remainder of vital signs stable.  WBC 9.9, hemoglobin 11.7 (at baseline), platelet 206K.  Sodium 138, potassium 4.4, chloride 101, bicarb 22, BUN 20, creatinine 0.9, glucose 75.  Valproic acid level subtherapeutic at 13.  Screening SARS-CoV-2 PCR test and influenza panel pending.  UA pending.  Head CT negative for acute intracranial abnormality.  CTA head and neck negative for LVO or high-grade stenosis of the intracranial arteries.  Unchanged 60% stenosis of the proximal left internal carotid artery secondary to mixed density atherosclerosis.  Normal CT perfusion.  Patient was seen by neurology and after speaking to his family, patient's presentation was felt to be due to seizure-like activity at home.  Admitted for further work-up.  Patient was given IV valproate 250 mg bid.  Due to patient's very poor PO intake, palliative care was consulted. The patient's daughter has chosen to make her father a DNR. It has also been noted that the patient's living will documents that he would not want artificial means of feeding him.   Consultants  . Neurology  Procedures  . EEG  Antibiotics   Anti-infectives (From admission, onward)   None      Subjective  The patient is a cachectic and chronically ill appearing elderly man.   Objective   Vitals:  Vitals:   12/07/20 1134 12/07/20 1644  BP: (!) 166/83 (!) 144/56  Pulse: 77 74  Resp: 16 18  Temp: 98 F (36.7 C) 98.1 F (36.7 C)  SpO2: 100% 99%   Exam:  Constitutional:  . The patient is awake and alert. .No acute distress. He is elderly, weak and frail.  Respiratory:  . No increased work of breathing. . No wheezes, rales, or rhonchi . No tactile fremitus Cardiovascular:  . Regular rate and rhythm . No murmurs, ectopy, or gallups. . No lateral PMI. No thrills. Abdomen:  . Abdomen is soft, non-tender, non-distended . No hernias, masses, or organomegaly . Normoactive bowel sounds.  Musculoskeletal:  . No cyanosis, clubbing, or edema Skin:  . No rashes, lesions, ulcers . palpation of skin: no induration or nodules Neurologic:  . CN 2-12 intact . Sensation all 4 extremities intact Psychiatric:  . I am unable to evaluate the patient due to his inability to cooperate with exam.  I have personally reviewed the following:   Today's Data  . Vitals, BMP, CBC  Imaging  . CT Cerebral perfusion . CT Head . CTA head and neck  Cardiology Data  . EKG . Echocardiogram  EEG: positive for seizures  Scheduled Meds: . enoxaparin (LOVENOX) injection  40 mg Subcutaneous Daily  . lubriderm seriously sensitive   Topical Daily   Continuous Infusions: . dextrose 5% lactated ringers 100 mL/hr at  12/07/20 1437  . valproate sodium 250 mg (12/07/20 1435)    Principal Problem:   Seizure ([place]) Active Problems:   Hypertension   Hyperlipidemia   Diabetes ([place])   Breakthrough seizure ([place])   Pressure injury of skin   LOS: 2 days   A & P  Breakthrough seizure: Likely due to Depakote nonadherence.  Valproic acid level subtherapeutic on labs.  Seen by neurology and after speaking to his family, neurology feels that his presentation is due to seizure-like activity at  home.  Head CT negative for acute intracranial abnormality.  CTA head and neck negative for LVO or high-grade stenosis of the intracranial arteries.  Unchanged 60% stenosis of the proximal left internal carotid artery secondary to mixed density atherosclerosis.  Normal CT perfusion. Neurology following, appreciate recommendations.  Patient was given loading dose IV valproate 600 mg in the ED.  Continue scheduled Depakote 250 bid.  IV Ativan 2 mg as needed if there is recurrence of seizure-like activity.  Seizure precautions. EEG is positive for seizures. Depakote level is at 49. Lower limits of normal is 50. Neurology feels that the symptoms seen are due to advanced dementia and debility with seizures and a subtherapeutic valprioc acid level. They recommend rasing depakote dose to 250 mg tid. They agree with goasl of care discussion given that the patient's functional status was quite low prior to this latest decline.  Dysarthria/dysphagia: Pt remains NPO. SLP eval is pending. Per conversation with the patient's sister, his speech is ordinarily easily understood. The patient has been evaluated by SLP. They believe that the patient's slowed mental status is affecting his swallow. They recommend continued NPO with oral meds being administered crushed in puree. The patients advanced directive does not support the option of artificial means of nutrition. Palliative care has been consulted. The patient is currently receiving IV LR with D5.  Type II diabetes: Diabetes listed in the chart but patient is not on any medications.  A1c was 4.4 in 2018 and 4.6 in 2019; no recent values in the chart. Repeat A1c is 4.3. There is no evidence to support the diagnosis of DM II.   Hypertensive urgency: Blood pressure improved with IV hydralazine.  Will resume home meds as crushed in puree as recommended by SLP.  History of substance abuse: UDS is negative.  Depression/Hyperlipidemia/History of prostate cancer: Unable to  safely order home medications at this time as pharmacy med rec is pending and patient has not passed swallow eval.  I have seen and examined this patient myself. I have spent 38 minutes in his evaluation and care.  DVT prophylaxis: Lovenox Code Status: DNR Family Communication: None available Disposition Plan:  Status is: Inpatient  Remains inpatient appropriate because:Inpatient level of care appropriate due to severity of illness  Dispo: The patient is from: SNF              Anticipated d/c is to: Hospice              Anticipated d/c date is: 2 days              Patient currently is not medically stable to d/c.  Junetta Hearn, DO [facility] Direct contact: see [email or web address]  7PM-7AM contact night coverage as above 12/07/2020, 5:09 PM  LOS: 0 days

## 2020-12-07 NOTE — Progress Notes (Signed)
  Speech Language Pathology Treatment: Dysphagia  Patient Details Name: Brad [name] MRN: [id number] DOB: [birth year] Today's Date: 12/07/2020 Time: 1010-1019 SLP Time Calculation (min) (ACUTE ONLY): 9 min  Assessment / Plan / Recommendation Clinical Impression  Pt initially appeared to have some increased awareness and automaticity with PO intake, and he does appear to swallow intermittently although with quite a prolonged length of time after bolus presentation. However, upon inspecting his oral cavity, he is actually holding large volumes of boluses in his mouth. Suction was used to retrieve this. Mentation is likely largely contributing to current level of function, but he is still not ready for PO diet given concern for safety but also for adequate PO intake. Unclear prognosis for return to PO diet at this point given impact from mentation.   HPI HPI: Pt is a 74 yo male presenting with AMS. CTH/CTA negative for acute findings; MRI pending. Per MD, there is concern for breakthrough seizure due to missed medications. PMH includes: prostate ca, anemia, arthritis, dementia, depression, DM, HTN, seizures, substance abuse. Previous MBS in June 2020 revealed normal oropharyngeal swallow but with pt c/o globus sensation. Esophagram the same day revealed moderate esophageal dysmotility.      SLP Plan  Continue with current plan of care       Recommendations  Diet recommendations: NPO Medication Administration: Crushed with puree                Oral Care Recommendations: Oral care QID Follow up Recommendations: Skilled Nursing facility SLP Visit Diagnosis: Dysphagia, unspecified (R13.10) Plan: Continue with current plan of care       GO                Osie Bond., M.A. [place] [facility] Pager [phone] Office [phone]  12/07/2020, 11:11 AM

## 2020-12-07 NOTE — Evaluation (Signed)
Occupational Therapy Evaluation Patient Details Name: Brad [name] MRN: [id number] DOB: 01-18-1946 Today's Date: 12/07/2020    History of Present Illness 74 yo male presenting with AMS. CTH/CTA negative for acute findings; MRI pending. Per MD, there is concern for breakthrough seizure due to missed medications. 12/19 EEG 2 focal motor seizures (RUE rhythmic twitching) suggestive of PRES. PMH includes: prostate ca, anemia, arthritis, dementia, depression, DM, HTN, seizures, substance abuse.   Clinical Impression   PTA patient was living with his sister and brother-in-law in a private residence with ramped entry. Per sister report given via phone call, patient was requiring assistance for functional transfers, wc mobility, and ADLs at baseline with the exception of self-feeding and often refused care. Patient was mostly bed bound x several months. Per sister report patient was able to communicate and make needs known. Patient unable to follow 1-step verbal commands or actively participate with evaluation this date demonstrating deficits in cognition, communication, balance and strength with contractures present in LUE>RUE and BLE indicating increased risk for decreased skin integrity and further contractures. Patient would benefit from trial with skilled acute OT services to assess level of participation. Recommendation for SNF rehab vs. LTC pending patient progress.     Follow Up Recommendations  SNF (pending patient progress. Patient may need LTC.)    Equipment Recommendations  Other (comment) (TBD)    Recommendations for Other Services       Precautions / Restrictions Precautions Precautions: Fall;Other (comment) (Seizures) Restrictions Weight Bearing Restrictions: No      Mobility Bed Mobility Overal bed mobility: Needs Assistance Bed Mobility: Rolling Rolling: Total assist         General bed mobility comments: Total A grossly. Patient does not initiate.    Transfers                  General transfer comment: Did not progress beyond bed level for patient/therapist safety.    Balance                                           ADL either performed or assessed with clinical judgement   ADL Overall ADL's : Needs assistance/impaired Eating/Feeding: Total assistance   Grooming: Total assistance           Upper Body Dressing : Total assistance;Bed level   Lower Body Dressing: Total assistance;Bed level;+2 for physical assistance       Toileting- Clothing Manipulation and Hygiene: Total assistance;Bed level;+2 for physical assistance         General ADL Comments: Total A grossly for self-care tasks. Patient unable to assist.     Vision   Vision Assessment?: Vision impaired- to be further tested in functional context Additional Comments: Patient able to track therapist around room. Apparent R gaze preference. Unable to formally assess vision 2/2 cognitive deficits.     Perception     Praxis Praxis Praxis tested?: Not tested    Pertinent Vitals/Pain Pain Assessment: Faces Faces Pain Scale: Hurts little more Pain Location: RUE and cervical neck with PROM. Pain Descriptors / Indicators: Guarding;Grimacing Pain Intervention(s): Limited activity within patient's tolerance;Monitored during session;Repositioned     Hand Dominance  (Unknown)   Extremity/Trunk Assessment Upper Extremity Assessment Upper Extremity Assessment: RUE deficits/detail;LUE deficits/detail RUE Deficits / Details: Patient very rigid. Unable to fully assess PROM. No purposeful AROM noted during evaluation. Hand remains closed. Palm guard applied.  RUE Sensation:  (Unable to assess 2/2 cognition.) RUE Coordination: decreased fine motor;decreased gross motor LUE Deficits / Details: Patient with rigidity although less prominent than in RUE. Patient able to maintain open hand. LUE Sensation:  (Unable to assess 2/2 cognition.) LUE Coordination:  decreased fine motor;decreased gross motor   Lower Extremity Assessment Lower Extremity Assessment: Defer to PT evaluation RLE Deficits / Details: early contracture hip and knee flexion--able to extend hip to -20, knee to -30 LLE Deficits / Details: full PROM   Cervical / Trunk Assessment Cervical / Trunk Assessment: Other exceptions Cervical / Trunk Exceptions: very rigid   Communication Communication Communication: Other (comment) (Patient mumbling occasionally. Does not appear to be intentional or purposeful.)   Cognition Arousal/Alertness: Awake/alert Behavior During Therapy: Flat affect Overall Cognitive Status: No family/caregiver present to determine baseline cognitive functioning                                 General Comments: Unable to follow 1-step verbal commands. Sister states that patient was able to hold purposeful conversations at baseline.   General Comments  R palm guard applied to maintain skin integrity and prevent pressure injury for overgrown nails. RN aware.    Exercises Exercises: Other exercises Other Exercises Other Exercises: PROM, 30 sec holds to RLE   Shoulder Instructions      Home Living Family/patient expects to be discharged to:: Private residence Living Arrangements: Other relatives Available Help at Discharge: [facility] (vs. LTC) Type of Home: House Home Access: Ramped entrance     [place]: One level     Bathroom Shower/Tub: Teacher, early years/pre: [place]: Environmental consultant - 2 wheels;Wheelchair - manual   Additional Comments: Patient lives with his sister who was providing 24hr supervision/assist at baseline.      Prior Functioning/Environment Level of Independence: Needs assistance  Gait / Transfers Assistance Needed: Patient was not ambulatory at baseline and was requiring increasingly more assist with functional transfers to and from wc. ADL's / Homemaking Assistance  Needed: Assist for all ADLs grossly with the exception of self-feeding. Patient had been refusing care for several months. Communication / Swallowing Assistance Needed: Minced diet per sisters report. Comments: unknown; appears lived alone?        OT Problem List: Decreased strength;Decreased range of motion;Impaired balance (sitting and/or standing);Decreased coordination;Decreased cognition;Impaired tone;Impaired UE functional use      OT Treatment/Interventions: Self-care/ADL training;Therapeutic exercise;DME and/or AE instruction;Splinting;Therapeutic activities;Cognitive remediation/compensation;Patient/family education;Balance training    OT Goals(Current goals can be found in the care plan section) Acute Rehab OT Goals Patient Stated Goal: Unable OT Goal Formulation: Patient unable to participate in goal setting Time For Goal Achievement: 12/21/20 Potential to Achieve Goals: Poor ADL Goals Pt Will Perform Eating: with max assist;sitting;bed level Pt Will Perform Grooming: with max assist;sitting;bed level Additional ADL Goal #1: Patient will tolerate R palm guard for 6-8hrs to maintain skin integrity with skin checks every 2hrs.  OT Frequency: Other (comment) (1-2 more sessions to address positioning and equipment needs.)   Barriers to D/C: Decreased caregiver support  Family unable to provide current level of care needed.       Co-evaluation              AM-PAC OT "6 Clicks" Daily Activity     Outcome Measure Help from another person eating meals?: Total Help from another person taking care of  personal grooming?: Total Help from another person toileting, which includes using toliet, bedpan, or urinal?: Total Help from another person bathing (including washing, rinsing, drying)?: Total Help from another person to put on and taking off regular upper body clothing?: Total Help from another person to put on and taking off regular lower body clothing?: Total 6 Click  Score: 6   End of Session Nurse Communication: Need for lift equipment (Maxi move)  Activity Tolerance:   Patient left: in bed;with call bell/phone within reach;with bed alarm set  OT Visit Diagnosis: Other abnormalities of gait and mobility (R26.89);Muscle weakness (generalized) (M62.81);Ataxia, unspecified (R27.0);Feeding difficulties (R63.3);Other symptoms and signs involving cognitive function;Adult, failure to thrive (R62.7)                Time: 2449-7530 OT Time Calculation (min): 21 min Charges:  OT General Charges $OT Visit: 1 Visit OT Evaluation $OT Eval Moderate Complexity: 1 Mod OT Treatments $Therapeutic Activity: 8-22 mins  Itamar Mcgowan H. OTR/L Supplemental OT, Department of rehab services [phone]  [place] Kretz R H. 12/07/2020, 12:31 PM

## 2020-12-07 NOTE — Consult Note (Signed)
                                                                                 Consultation Note Date: 12/07/2020   Patient Name: Brad [name]  DOB: 08/19/1946  MRN: [id number]  Age / Sex: 74 y.o., male  PCP: Patel, Nilay, DO Referring Physician: Swayze, Ava, DO  Reason for Consultation: Establishing goals of care  HPI/Patient Profile: 74 y.o. male  with past medical history of seizures, prostate cancer, depression, type 2 diabetes, hypertension, hyperlipidemia, and substance abuse. He presented to the emergency department on 12/04/2020 with altered mental status.  ED Course: Valproic acid level subtherapeutic at 13. Head CT negative for acute intracranial abnormality. CTA head and neck negative for LVO or high-grade stenosis of the intracranial arteries. Normal CT perfusion. Patient was seen by neurology and felt patient's presentation was due to seizure-like activity at home. Patient admitted to [facility] for further management of breakthrough seizure likely due to medication non-adherence.   Clinical Assessment and Goals of Care: I have reviewed medical records including EPIC notes, labs and imaging,  and met at bedside with sister ([name])  to discuss diagnosis, prognosis, GOC, EOL wishes, disposition, and options.  I introduced Palliative Medicine as specialized medical care for people living with serious illness. It focuses on providing relief from the symptoms and stress of a serious illness.   We discussed a brief life review of the patient. He is originally from [place], [place]. He relocated to [place] in 1969 to "get away from the country" and "have a better way of life". Over the years, he worked as a garage attendant, a janitor, and at a garment center. He never married or had children. He moved back to [place] in 2004.   Patient has lived with his sister [name] since 2018. When he first came to live with her, he was mostly independent. She reports significant decline in his  functional status over the past few months. He spends most of the day in bed or in his chair. He needs assistance ADLs. He is incontinent of bowel and bladder. [name] expresses that he seems to "have given up".   We discussed his current illness and what it means in the larger context of his ongoing co-morbidities.  Natural disease trajectory of chronic illness and debility was discussed.  I attempted to elicit values and goals of care important to the patient. [name] expresses that she has tried to encourage her brother to maintain his independence as long as possible. She has encouraged him to continue doing things for himself rather than doing everything for him. However, she reports that he seems to have "given up" over the past few months.   The difference between aggressive medical intervention and comfort care was considered in light of the patient's goals of care. I introduced the concept of a comfort path to [name] and discussed that the focus was on quality of life rather than prolonging life. Introduced hospice philosophy and provided information on home vs residential hospice services - answered all questions. [name] shares she is unable to continue providing care for him at home.   I expressed concern that patient is   not able to follow commands and was not able to pass the swallow evaluation with SLP. Discussed that if he is not able to safely eat or drink, he would have a limited prognosis (less than 2 weeks). Discussed that he has a living will that indicates he would not want a feeding tube.   We did discuss code status. Encouraged [name] to consider DNR/DNI status understanding evidenced based poor outcomes in similar hospitalized patients, as the cause of the arrest is likely associated with chronic/terminal disease rather than a reversible acute cardio-pulmonary event. She agrees that DNR status is appropriate.  Questions and concerns were addressed.  The family was  encouraged to call with questions or concerns.   Primary decision maker: sister and Monico Blitz [phone]  SUMMARY OF RECOMMENDATIONS   - code status changed to DNR/DNI - continue current medical care - sister is cautiously hopeful for improvement; discussed watchful waiting for 24-48 hours - if no improvement in neurologic status or po intake, patient would be eligible for residential hospice - in the event of improvement, patient would need SNF (sister is unable to continue caring for him home) - PMT will continue to follow  Code Status/Advance Care Planning:  DNR  Symptom Management:  - per primary team  Palliative Prophylaxis:   Aspiration, Oral Care and Turn Reposition  Additional Recommendations (Limitations, Scope, Preferences):  No Artificial Feeding  Psycho-social/Spiritual:   Created space and opportunity for family to express thoughts and feelings regarding patient's current medical situation.   Emotional support provided   Prognosis:   Overall poor. If patient continues with no po intake, less than 2 weeks  Discharge Planning: To Be Determined      Primary Diagnoses: Present on Admission: . Hyperlipidemia . Hypertension . Breakthrough seizure ([place])   I have reviewed the medical record, interviewed the patient and family, and examined the patient. The following aspects are pertinent.  Past Medical History:  Diagnosis Date  . Anemia   . Arthritis   . Cancer [facility]) 2017   prostate  . Depression   . Diabetes mellitus without complication ([place])   . History of stomach ulcers   . Hypertension   . Seizures ([place])   . Substance abuse ([place])    Social History   Socioeconomic History  . Marital status: Single    Spouse name: Not on file  . Number of children: Not on file  . Years of education: Not on file  . Highest education level: High school graduate  Occupational History  . Occupation: retired  Tobacco Use  . Smoking status:  Former Smoker    Types: Cigarettes  . Smokeless tobacco: Never Used  . Tobacco comment: occasional- patient smokes some marijauna from time to time  Vaping Use  . Vaping Use: Never used  Substance and Sexual Activity  . Alcohol use: Not Currently    Alcohol/week: 2.0 - 3.0 standard drinks    Types: 2 - 3 Cans of beer per week    Comment: occasional  . Drug use: Not Currently    Types: Marijuana    Comment: occasional  . Sexual activity: Not on file  Other Topics Concern  . Not on file  Social History Narrative  . Not on file    Family History  Problem Relation Age of Onset  . Seizures Neg Hx    Scheduled Meds: . enoxaparin (LOVENOX) injection  40 mg Subcutaneous Daily  . lubriderm seriously sensitive   Topical Daily  Continuous Infusions: . dextrose 5% lactated ringers 100 mL/hr at 12/07/20 0445  . valproate sodium 250 mg (12/07/20 0612)   PRN Meds:.acetaminophen, hydrALAZINE, LORazepam  Medications Prior to Admission:  Prior to Admission medications   Medication Sig Start Date End Date Taking? Authorizing Provider  acetaminophen (TYLENOL) 500 MG tablet Take 1,000 mg by mouth every 6 (six) hours as needed for moderate pain.   Yes [provider]  amLODipine (NORVASC) 2.5 MG tablet Take 1 tablet (2.5 mg total) by mouth daily. 04/19/18  Yes Tereasa Coop, PA-C  atorvastatin (LIPITOR) 20 MG tablet Take 1 tablet (20 mg total) by mouth daily. Patient taking differently: Take 20 mg by mouth at bedtime. 04/19/18  Yes Tereasa Coop, PA-C  divalproex (DEPAKOTE ER) 500 MG 24 hr tablet Take 1 tablet (500 mg total) by mouth daily. Patient taking differently: Take 500 mg by mouth at bedtime. 06/10/20  Yes Lomax, Amy, NP  FLUoxetine (PROZAC) 40 MG capsule Take 40 mg by mouth at bedtime. 11/24/20  Yes [provider]  meloxicam (MOBIC) 15 MG tablet Take 15 mg by mouth at bedtime. 12/31/19  Yes [provider]  mirtazapine (REMERON) 30 MG tablet Take 30 mg by  mouth at bedtime. 10/30/19  Yes [provider]  tamsulosin (FLOMAX) 0.4 MG CAPS capsule Take 0.4 mg by mouth at bedtime. 05/03/20  Yes [provider]  FLUoxetine (PROZAC) 20 MG capsule Take 2 capsules (40 mg total) by mouth daily. Patient not taking: Reported on 12/05/2020 04/19/18   Tereasa Coop, PA-C   No Known Allergies Review of Systems  Unable to perform ROS: Patient nonverbal    Physical Exam Vitals reviewed.  Constitutional:      General: He is not in acute distress.    Appearance: He is cachectic. He is ill-appearing.  HENT:     Head: Normocephalic and atraumatic.  Cardiovascular:     Rate and Rhythm: Normal rate. Rhythm irregularly irregular.     Comments: A-fib Pulmonary:     Effort: Pulmonary effort is normal.  Neurological:     Mental Status: He is lethargic.     Motor: Weakness present.     Comments: Does not follow commands Minimal verbal output     Vital Signs: BP (!) 166/83 (BP Location: Left Arm)   Pulse 77   Temp 98 F (36.7 C) (Oral)   Resp 16   Ht 6' 1" (1.854 m)   Wt 58.3 kg   SpO2 100%   BMI 16.96 kg/m  Pain Scale: Faces   Pain Score: 5    SpO2: SpO2: 100 % O2 Device:SpO2: 100 % O2 Flow Rate: .O2 Flow Rate (L/min): 2 L/min  IO: Intake/output summary:   Intake/Output Summary (Last 24 hours) at 12/07/2020 1313 Last data filed at 12/06/2020 1543 Gross per 24 hour  Intake 809.83 ml  Output --  Net 809.83 ml    LBM: Last BM Date: 12/07/20 Baseline Weight: Weight: 58.3 kg Most recent weight: Weight: 58.3 kg      Palliative Assessment/Data: PPS 10%    Time In: 14:00 Time Out: 15:13 Time Total: 73 minutes Greater than 50%  of this time was spent counseling and coordinating care related to the above assessment and plan.  Signed by: Lavena Bullion, NP   Please contact Palliative Medicine Team phone at [phone] for questions and concerns.  For individual provider: See Shea Evans

## 2020-12-07 NOTE — Evaluation (Addendum)
Physical Therapy Evaluation Patient Details Name: Brad [name] MRN: [id number] DOB: [birth year] Today's Date: 12/07/2020   History of Present Illness  74 yo male presenting with AMS. CTH/CTA negative for acute findings; MRI pending. Per MD, there is concern for breakthrough seizure due to missed medications. 12/19 EEG 2 focal motor seizures (RUE rhythmic twitching) suggestive of PRES. PMH includes: prostate ca, anemia, arthritis, dementia, depression, DM, HTN, seizures, substance abuse.  Clinical Impression   Pt admitted with above diagnosis. Patient's prior functional status in unknown, however noted his sister found him in current state, so appears he was living at home and likely much more functional than he currently is. Currently he follows no commands and he does not assist with facilitated movements. He is quickly developing RLE contractures.  Pt currently with functional limitations due to the deficits listed below (see PT Problem List). Pt may benefit from skilled PT to increase their mobility and decrease caregiver burden. As he is currently unable to participate but was previously mobile (and even perhaps independent) will attempt a trial of PT. Will plan to reassess at least 2 more visits to assess ability to participate/benefit.    *Notified Dr. Gerri Lins and Efraim Kaufmann, RN that pt could benefit from an air mattress to reduce risk of pressure sores.     Follow Up Recommendations Other--Long-term care if remains unable to participate; if able to participate will benefit from [facility]    Equipment Recommendations  Hospital bed;Other (comment) (lift)    Recommendations for Other Services OT consult     Precautions / Restrictions Precautions Precautions: Fall;Other (comment) (seizure)      Mobility  Bed Mobility Overal bed mobility: Needs Assistance Bed Mobility: Rolling Rolling: Total assist         General bed mobility comments: rolling rt and lt for skin check and  positioning; pt not resisting but very rigid/stiff    Transfers                    Ambulation/Gait                Stairs            Wheelchair Mobility    Modified Rankin (Stroke Patients Only)       Balance                                             Pertinent Vitals/Pain Pain Assessment: Faces Faces Pain Scale: Hurts little more Pain Location: RLE, neck with PROM Pain Descriptors / Indicators: Guarding;Grimacing Pain Intervention(s): Limited activity within patient's tolerance;Monitored during session;Repositioned    Home Living Family/patient expects to be discharged to:: Unsure                 Additional Comments: Per chart, from home (sister went to check on him not following commands)    Prior Function           Comments: unknown; appears lived alone?     Hand Dominance        Extremity/Trunk Assessment   Upper Extremity Assessment Upper Extremity Assessment: Defer to OT evaluation (limited PROM to fingers to assess palms due to long nails)    Lower Extremity Assessment Lower Extremity Assessment: RLE deficits/detail;LLE deficits/detail RLE Deficits / Details: early contracture hip and knee flexion--able to extend hip to -20, knee to -30 LLE Deficits / Details:  full PROM    Cervical / Trunk Assessment Cervical / Trunk Assessment: Other exceptions Cervical / Trunk Exceptions: very rigid with rolling, positioning  Communication   Communication: Other (comment) (no attempts elicited)  Cognition Arousal/Alertness: Awake/alert Behavior During Therapy: Flat affect Overall Cognitive Status: Difficult to assess                                 General Comments: followed no commands      General Comments General comments (skin integrity, edema, etc.): Called RN to address yellow/green drainage from left eye.    Exercises Other Exercises Other Exercises: PROM, 30 sec holds to RLE    Assessment/Plan    PT Assessment Patient needs continued PT services (as a trial)  PT Problem List Decreased strength;Decreased range of motion;Decreased activity tolerance;Decreased balance;Decreased mobility;Decreased cognition;Decreased knowledge of use of DME;Impaired tone;Pain       PT Treatment Interventions DME instruction;Functional mobility training;Therapeutic activities;Therapeutic exercise;Balance training;Neuromuscular re-education;Cognitive remediation;Patient/family education;Manual techniques    PT Goals (Current goals can be found in the Care Plan section)  Acute Rehab PT Goals Patient Stated Goal: unable PT Goal Formulation: Patient unable to participate in goal setting Time For Goal Achievement: 12/21/20 Potential to Achieve Goals: Poor    Frequency Min 2X/week (TRIAL of PT)   Barriers to discharge Decreased caregiver support      Co-evaluation               AM-PAC PT "6 Clicks" Mobility  Outcome Measure Help needed turning from your back to your side while in a flat bed without using bedrails?: Total Help needed moving from lying on your back to sitting on the side of a flat bed without using bedrails?: Total Help needed moving to and from a bed to a chair (including a wheelchair)?: Total Help needed standing up from a chair using your arms (e.g., wheelchair or bedside chair)?: Total Help needed to walk in hospital room?: Total Help needed climbing 3-5 steps with a railing? : Total 6 Click Score: 6    End of Session   Activity Tolerance: Treatment limited secondary to medical complications (Comment) (decr cognition) Patient left: in bed;with call bell/phone within reach;with bed alarm set Nurse Communication: Mobility status;Other (comment) (rec air mattress) PT Visit Diagnosis: Other symptoms and signs involving the nervous system (G92.119)    Time: 4174-0814 PT Time Calculation (min) (ACUTE ONLY): 23 min   Charges:   PT Evaluation $PT  Eval Low Complexity: 1 Low PT Treatments $Therapeutic Exercise: 8-22 mins         Arby Barrette, PT Pager [phone]   Rexanne Mano 12/07/2020, 9:55 AM

## 2020-12-07 NOTE — Progress Notes (Addendum)
   12/07/20 2339  Vitals  Temp 98.4 F (36.9 C)  Temp Source Oral  BP (!) 180/74  MAP (mmHg) 100  BP Location Left Arm  BP Method Automatic  Patient Position (if appropriate) Lying  Pulse Rate Source Monitor  ECG Heart Rate 80    Hydralazine admin per order. Patient denied any distress. Will recheck b/p post med   0007:  Rechecked b/p  132/72 MAP  (89)  74HR 94% RA

## 2020-12-08 LAB — BASIC METABOLIC PANEL
Anion gap: 9 (ref 5–15)
BUN: 6 mg/dL — ABNORMAL LOW (ref 8–23)
CO2: 28 mmol/L (ref 22–32)
Calcium: 8.2 mg/dL — ABNORMAL LOW (ref 8.9–10.3)
Chloride: 102 mmol/L (ref 98–111)
Creatinine, Ser: 0.77 mg/dL (ref 0.61–1.24)
GFR, Estimated: >60 mL/min (ref >60)
Glucose, Bld: 93 mg/dL (ref 70–99)
Potassium: 2.9 mmol/L — ABNORMAL LOW (ref 3.5–5.1)
Sodium: 139 mmol/L (ref 135–145)

## 2020-12-08 LAB — CBC WITH DIFFERENTIAL/PLATELET
Abs Immature Granulocytes: 0.01 10*3/uL (ref 0.00–0.07)
Basophils Absolute: 0 10*3/uL (ref 0.0–0.1)
Basophils Relative: 0 %
Eosinophils Absolute: 0 10*3/uL (ref 0.0–0.5)
Eosinophils Relative: 1 %
HCT: 28.3 % — ABNORMAL LOW (ref 39.0–52.0)
Hemoglobin: 9.3 g/dL — ABNORMAL LOW (ref 13.0–17.0)
Immature Granulocytes: 0 %
Lymphocytes Relative: 31 %
Lymphs Abs: 1.9 10*3/uL (ref 0.7–4.0)
MCH: 31.3 pg (ref 26.0–34.0)
MCHC: 32.9 g/dL (ref 30.0–36.0)
MCV: 95.3 fL (ref 80.0–100.0)
Monocytes Absolute: 0.5 10*3/uL (ref 0.1–1.0)
Monocytes Relative: 8 %
Neutro Abs: 3.7 10*3/uL (ref 1.7–7.7)
Neutrophils Relative %: 60 %
Platelets: 130 10*3/uL — ABNORMAL LOW (ref 150–400)
RBC: 2.97 MIL/uL — ABNORMAL LOW (ref 4.22–5.81)
RDW: 12 % (ref 11.5–15.5)
WBC: 6.2 10*3/uL (ref 4.0–10.5)
nRBC: 0 % (ref 0.0–0.2)

## 2020-12-08 LAB — IRON AND TIBC
Iron: 32 ug/dL — ABNORMAL LOW (ref 45–182)
Saturation Ratios: 18 % (ref 17.9–39.5)
TIBC: 175 ug/dL — ABNORMAL LOW (ref 250–450)
UIBC: 143 ug/dL

## 2020-12-08 LAB — MAGNESIUM: Magnesium: 1.2 mg/dL — ABNORMAL LOW (ref 1.7–2.4)

## 2020-12-08 LAB — FERRITIN: Ferritin: 240 ng/mL (ref 24–336)

## 2020-12-08 MED ORDER — VALPROATE SODIUM 500 MG/5ML IV SOLN
250.0000 mg | Freq: Three times a day (TID) | INTRAVENOUS | Status: DC
  Administered 2020-12-08 – 2020-12-11 (×9): 250 mg via INTRAVENOUS
  Filled 2020-12-08 (×11): qty 2.5

## 2020-12-08 MED ORDER — VALPROIC ACID 250 MG PO CAPS: 250.0000 mg | ORAL_CAPSULE | Freq: Three times a day (TID) | ORAL | Status: DC

## 2020-12-08 MED ORDER — MORPHINE SULFATE (CONCENTRATE) 10 MG/0.5ML PO SOLN
5.0000 mg | ORAL | Status: DC | PRN
  Administered 2020-12-16 – 2020-12-25 (×4): 5 mg via SUBLINGUAL
  Filled 2020-12-08 (×4): qty 0.5

## 2020-12-08 MED ORDER — POTASSIUM CHLORIDE 10 MEQ/100ML IV SOLN
10.0000 meq | INTRAVENOUS | Status: AC
  Administered 2020-12-08: 18:00:00 10 meq via INTRAVENOUS
  Filled 2020-12-08 (×2): qty 100

## 2020-12-08 NOTE — Progress Notes (Signed)
Daily Progress Note   Patient Name: Brad [name]       Date: 12/08/2020 DOB: 08/23/1946  Age: 74 y.o. MRN#: [id number] Attending Physician: Mckinley Jewel, MD Primary Care Physician: [name], DO Admit Date: 12/04/2020  Reason for Follow-up: continued   Subjective: 12:45--patient is curled up on his side. He is not communicative and will not follow commands.   16:30--I spoke with sister [name] by phone Discussed that patient has not improved. Discussed that patient was able to swallow some water with SLP, but continues to orally hold the purees and required suctioning for complete removal. We discussed that his oral intake is very minimal and not enough to sustain him long-term.  [name] inquires about the possibility of a feeding tube. I gently remind her of his living will which states he would not want life-prolonging measures if he suffers from "any other condition which results in the substantial loss of my ability to think and my doctors determine that this is not going to get better". I also provide education and counseling that artificial feeding would not provide quality of life. [name] verbalizes understanding.   Discussed again that patient has been declining for months prior to this breakthrough seizure.   I reviewed the concept of a comfort path with [name] and encouraged her to proceed with this transition so as to focus on Mr. Scher comfort and dignity rather than prolonging his life. Advised that he should be eligible for residential hospice - she agrees to this and would want him at [facility].   Discussed transitioning to comfort care while in the hospital, and what that would look like--keeping him clean and dry, no labs, no artificial hydration or  feeding, no antibiotics, minimizing of medications, comfort feeds, medication for pain and dyspnea. [name] wants him to receive medications for pain as needed and she agrees to discontinue lab work. She is not ready to make the decision to stop IV fluids and monitoring, but would like to discuss this further tomorrow when she visits.    Length of Stay: 3  Current Medications: Scheduled Meds:  . enoxaparin (LOVENOX) injection  40 mg Subcutaneous Daily  . lubriderm seriously sensitive   Topical Daily  . valproic acid  250 mg Oral Q8H    Continuous Infusions: . dextrose 5% lactated ringers  100 mL/hr at 12/08/20 0223    PRN Meds: acetaminophen, hydrALAZINE, LORazepam, temazepam  Physical Exam Vitals reviewed.  Constitutional:      General: He is not in acute distress.    Appearance: He is cachectic. He is ill-appearing.  Cardiovascular:     Rate and Rhythm: Normal rate. Rhythm irregularly irregular.     Comments: A-fib Pulmonary:     Effort: Pulmonary effort is normal.  Neurological:     Mental Status: He is lethargic.     Motor: Weakness present.     Comments: Not following commands             Vital Signs: BP (!) 155/63 (BP Location: Left Arm)   Pulse 70   Temp 99.2 F (37.3 C) (Oral)   Resp 18   Ht 6\' 1"  (1.854 m)   Wt 58.3 kg   SpO2 100%   BMI 16.96 kg/m  SpO2: SpO2: 100 % O2 Device: O2 Device: Room Air O2 Flow Rate: O2 Flow Rate (L/min): 2 L/min  Intake/output summary:   Intake/Output Summary (Last 24 hours) at 12/08/2020 1251 Last data filed at 12/08/2020 1051 Gross per 24 hour  Intake 1896.46 ml  Output 850 ml  Net 1046.46 ml   LBM: Last BM Date: 12/07/20 Baseline Weight: Weight: 58.3 kg Most recent weight: Weight: 58.3 kg       Palliative Assessment/Data: PPS 10%     Palliative Care Assessment & Plan   HPI/Patient Profile: 74 y.o. male  with past medical history of seizures, prostate cancer, depression, type 2 diabetes, hypertension,  hyperlipidemia, and substance abuse. He presented to the emergency department on 12/04/2020 with altered mental status.  ED Course: Valproic acid level subtherapeutic at 13. Head CT negative for acute intracranial abnormality. CTA head and neck negative for LVO or high-grade stenosis of the intracranial arteries. Normal CT perfusion. Patient was seen by neurology and felt patient's presentation was due to seizure-like activity at home. Patient admitted to [facility] for further management of breakthrough seizure likely due to medication non-adherence.   Assessment: - breakthrough seizure - dysarthria/dysphagia - hypertensive urgency - type 2 DM - history of substance abuse - depression/hyperlipidemia/history of prostate cancer - failure to thrive, chronic debility - minimal to no po intake  Recommendations/Plan: - DNR/DNI as previously documented - Transfer to [facility] when bed becomes available - TOC consult placed - D/C labs - morphine concentrate solution 5 mg every 3 hours prn pain or dyspnea - PMT will meet with sister tomorrow when she visits to further discuss transition to comfort care  Goals of Care and Additional Recommendations: Limitations on Scope of Treatment: No Artificial Feeding and No Lab Draws  Code Status: DNR  Prognosis:  < 2 weeks  Discharge Planning: Hospice facility  Care plan was discussed with Dr. Doristine Bosworth and case management  Thank you for allowing the Palliative Medicine Team to assist in the care of this patient.   Total Time 35 minutes Prolonged Time Billed  no       Greater than 50%  of this time was spent counseling and coordinating care related to the above assessment and plan.  Lavena Bullion, NP  Please contact Palliative Medicine Team phone at [phone] for questions and concerns.

## 2020-12-08 NOTE — Progress Notes (Addendum)
PROGRESS NOTE    Brad [name]  [id number] DOB: 09-14-1946 DOA: 12/04/2020 PCP: [name], DO   Brief Narrative:  Patient is 74 year old male with past medical history of prostate cancer, depression, type 2 diabetes mellitus, hypertension, hyperlipidemia, seizure, substance abuse presents to emergency department with altered mental status.  Upon arrival to ED: Patient's blood pressure was noted to be systolic [id number], remainder vital signs were stable.  WBC: 9.9, sodium 138, potassium 4.4, blood glucose: 75, valproic acid subtherapeutic at 13, COVID-19 and influenza Pallin: Negative.  Head CT negative for acute intracranial abnormality.  CTA head and neck negative for LVO or high-grade stenosis of intracranial arteries.  Unchanged 60% stenosis of proximal left internal carotid artery secondary to mixed density atherosclerosis.  Normal CT perfusion.  Patient was seen by neurology and after speaking to his family patient presentation was felt to be due to seizure-like activity at home.  Patient admitted for further evaluation and management.  Patient was given IV valproate to 50 mg twice daily.  Due to patient's poor p.o. intake palliative care was consulted.  Patient's daughter chosen to make her father DNR.  It has also been noted that the patient's living will documents that he would not want artificial means of feeding him.  Assessment & Plan:   Breakthrough seizures: -Likely secondary to medication noncompliance -CT head and CTA head and neck: Negative for acute findings. -Valproic acid level: Subtherapeutic. -Patient was given loading dose of IV valproate 600 mg in the ED.  Continue scheduled Depakene 250 3 times daily-as per neurology recommendations.   -Continue IV Ativan 2 mg as needed for seizures like activity. -EEG is positive for seizures. -Consult PT/OT recommend SNF -Monitor vitals closely -On seizure/aspiration precautions  Dysphagia: -Evaluated by speech therapy  recommended to keep him n.p.o. -Continue D5 at 100 cc/hr  Hypertensive urgency: -Patient's blood pressure improved after IV hydralazine. -Monitor blood pressure closely.  Continue to hold home p.o. BP meds as patient is NPO.  History of substance abuse: UDS negative this time.  Depression/hyperlipidemia/history of prostate cancer: -Continue to hold p.o. home meds at this time as patient is NPO.  Hypokalemia: Replenished.  Repeat BMP tomorrow a.m.  Check magnesium level.  Normocytic anemia: -Baseline hemoglobin between 10-11.  Hemoglobin 9.3 -Continue to monitor H&H closely and transfuse as needed. -We will check iron panel  Thrombocytopenia: Platelet 130 -Repeat BMP tomorrow a.m.  No signs of active bleeding noted.  DVT prophylaxis: Lovenox Code Status: DNR Family Communication:  None present at bedside.  Plan of care discussed with patient in length and he verbalized understanding and agreed with it. Disposition Plan: SNF  Consultants:   Neurology  Procedures:  CT head EEG Antimicrobials:   None  Status is: Inpatient  Remains inpatient appropriate because:Ongoing diagnostic testing needed not appropriate for outpatient work up and Unsafe d/c plan   Dispo: The patient is from: Home              Anticipated d/c is to: SNF              Anticipated d/c date is: 2 days              Patient currently is not medically stable to d/c.         Subjective: Patient seen and examined.  Sleepy but arousable, does not want to talk.  No acute events overnight.  Remained afebrile.  No seizure-like activity noted overnight.  Objective: Vitals:   12/08/20 0011 12/08/20 0445  12/08/20 0919 12/08/20 1316  BP: 132/72 (!) 141/62 (!) 155/63 (!) 151/75  Pulse:  76 70 91  Resp:  20 18 18   Temp:  98.3 F (36.8 C) 99.2 F (37.3 C) 99.8 F (37.7 C)  TempSrc:  Oral Oral Oral  SpO2:  96% 100% 96%  Weight:      Height:        Intake/Output Summary (Last 24 hours) at  12/08/2020 1357 Last data filed at 12/08/2020 1051 Gross per 24 hour  Intake 1896.46 ml  Output 850 ml  Net 1046.46 ml   Filed Weights   12/04/20 2310 12/04/20 2312  Weight: 58.3 kg 58.3 kg    Examination:  General exam: Appears calm and comfortable, on room air, sleepy, arousable and following commands Respiratory system: Clear to auscultation. Respiratory effort normal. Cardiovascular system: S1 & S2 heard, RRR. No JVD, murmurs, rubs, gallops or clicks. No pedal edema. Gastrointestinal system: Abdomen is nondistended, soft and nontender. No organomegaly or masses felt. Normal bowel sounds heard. Central nervous system: Sleepy but arousable and following commands. Skin: No rashes, lesions or ulcers    Data Reviewed: I have personally reviewed following labs and imaging studies  CBC: Recent Labs  Lab 12/04/20 2314 12/04/20 2315 12/06/20 0304 12/08/20 0910  WBC  --  9.9 11.9* 6.2  NEUTROABS  --  8.3* 9.4* 3.7  HGB 12.9* 11.7* 10.7* 9.3*  HCT 38.0* 36.5* 33.2* 28.3*  MCV  --  97.9 94.6 95.3  PLT  --  206 182 774*   Basic Metabolic Panel: Recent Labs  Lab 12/04/20 2314 12/04/20 2315 12/06/20 0304 12/08/20 0910  NA 140 138 139 139  K 4.5 4.4 3.7 2.9*  CL 102 101 100 102  CO2  --  22 28 28   GLUCOSE 73 75 128* 93  BUN 28* 20 15 6*  CREATININE 0.90 0.94 0.94 0.77  CALCIUM  --  9.1 8.9 8.2*   GFR: Estimated Creatinine Clearance: 67.8 mL/min (by C-G formula based on SCr of 0.77 mg/dL). Liver Function Tests: Recent Labs  Lab 12/04/20 2315  AST 28  ALT 17  ALKPHOS 70  BILITOT 1.1  PROT 7.0  ALBUMIN 3.6   No results for input(s): LIPASE, AMYLASE in the last 168 hours. Recent Labs  Lab 12/06/20 0304  AMMONIA 32   Coagulation Profile: Recent Labs  Lab 12/04/20 2315  INR 1.2   Cardiac Enzymes: No results for input(s): CKTOTAL, CKMB, CKMBINDEX, TROPONINI in the last 168 hours. BNP (last 3 results) No results for input(s): PROBNP in the last 8760  hours. HbA1C: No results for input(s): HGBA1C in the last 72 hours. CBG: Recent Labs  Lab 12/04/20 2308 12/05/20 0142  GLUCAP 73 86   Lipid Profile: No results for input(s): CHOL, HDL, LDLCALC, TRIG, CHOLHDL, LDLDIRECT in the last 72 hours. Thyroid Function Tests: No results for input(s): TSH, T4TOTAL, FREET4, T3FREE, THYROIDAB in the last 72 hours. Anemia Panel: No results for input(s): VITAMINB12, FOLATE, FERRITIN, TIBC, IRON, RETICCTPCT in the last 72 hours. Sepsis Labs: No results for input(s): PROCALCITON, LATICACIDVEN in the last 168 hours.  Recent Results (from the past 240 hour(s))  Resp Panel by RT-PCR (Flu A&B, Covid) Nasopharyngeal Swab     Status: None   Collection Time: 12/05/20 12:18 AM   Specimen: Nasopharyngeal Swab; Nasopharyngeal(NP) swabs in vial transport medium  Result Value Ref Range Status   SARS Coronavirus 2 by RT PCR NEGATIVE NEGATIVE Final    Comment: (NOTE) SARS-CoV-2 target nucleic acids  are NOT DETECTED.  The SARS-CoV-2 RNA is generally detectable in upper respiratory specimens during the acute phase of infection. The lowest concentration of SARS-CoV-2 viral copies this assay can detect is 138 copies/mL. A negative result does not preclude SARS-Cov-2 infection and should not be used as the sole basis for treatment or other patient management decisions. A negative result may occur with  improper specimen collection/handling, submission of specimen other than nasopharyngeal swab, presence of viral mutation(s) within the areas targeted by this assay, and inadequate number of viral copies(<138 copies/mL). A negative result must be combined with clinical observations, patient history, and epidemiological information. The expected result is Negative.  Fact Sheet for Patients:  EntrepreneurPulse.com.au  Fact Sheet for Healthcare Providers:  IncredibleEmployment.be  This test is no t yet approved or cleared by the  [place] FDA and  has been authorized for detection and/or diagnosis of SARS-CoV-2 by FDA under an Emergency Use Authorization (EUA). This EUA will remain  in effect (meaning this test can be used) for the duration of the COVID-19 declaration under Section 564(b)(1) of the Act, 21 U.S.C.section 360bbb-3(b)(1), unless the authorization is terminated  or revoked sooner.       Influenza A by PCR NEGATIVE NEGATIVE Final   Influenza B by PCR NEGATIVE NEGATIVE Final    Comment: (NOTE) The Xpert Xpress SARS-CoV-2/FLU/RSV plus assay is intended as an aid in the diagnosis of influenza from Nasopharyngeal swab specimens and should not be used as a sole basis for treatment. Nasal washings and aspirates are unacceptable for Xpert Xpress SARS-CoV-2/FLU/RSV testing.  Fact Sheet for Patients: EntrepreneurPulse.com.au  Fact Sheet for Healthcare Providers: IncredibleEmployment.be  This test is not yet approved or cleared by the [place] FDA and has been authorized for detection and/or diagnosis of SARS-CoV-2 by FDA under an Emergency Use Authorization (EUA). This EUA will remain in effect (meaning this test can be used) for the duration of the COVID-19 declaration under Section 564(b)(1) of the Act, 21 U.S.C. section 360bbb-3(b)(1), unless the authorization is terminated or revoked.  Performed at [facility], [place] [address], [place], [place] [address]       Radiology Studies: No results found.  Scheduled Meds: . enoxaparin (LOVENOX) injection  40 mg Subcutaneous Daily  . lubriderm seriously sensitive   Topical Daily  . valproic acid  250 mg Oral Q8H   Continuous Infusions: . dextrose 5% lactated ringers 100 mL/hr at 12/08/20 0223     LOS: 3 days   Time spent: 30 minutes  Sinclair Alligood Loann Quill, MD [facility]  If 7PM-7AM, please contact night-coverage [email or web address] 12/08/2020, 1:57 PM

## 2020-12-08 NOTE — Plan of Care (Signed)
Not progressing due to altered mental status and confusion.   Problem: Education: Goal: Knowledge of General Education information will improve Description: Including pain rating scale, medication(s)/side effects and non-pharmacologic comfort measures Outcome: Not Progressing   Problem: Health Behavior/Discharge Planning: Goal: Ability to manage health-related needs will improve Outcome: Not Progressing   Problem: Clinical Measurements: Goal: Ability to maintain clinical measurements within normal limits will improve Outcome: Not Progressing Goal: Will remain free from infection Outcome: Not Progressing Goal: Diagnostic test results will improve Outcome: Not Progressing Goal: Respiratory complications will improve Outcome: Not Progressing Goal: Cardiovascular complication will be avoided Outcome: Not Progressing   Problem: Activity: Goal: Risk for activity intolerance will decrease Outcome: Not Progressing   Problem: Nutrition: Goal: Adequate nutrition will be maintained Outcome: Not Progressing   Problem: Coping: Goal: Level of anxiety will decrease Outcome: Not Progressing   Problem: Elimination: Goal: Will not experience complications related to bowel motility Outcome: Not Progressing Goal: Will not experience complications related to urinary retention Outcome: Not Progressing   Problem: Pain Managment: Goal: General experience of comfort will improve Outcome: Not Progressing   Problem: Safety: Goal: Ability to remain free from injury will improve Outcome: Not Progressing   Problem: Skin Integrity: Goal: Risk for impaired skin integrity will decrease Outcome: Not Progressing   Problem: Education: Goal: Expressions of having a comfortable level of knowledge regarding the disease process will increase Outcome: Not Progressing   Problem: Coping: Goal: Ability to adjust to condition or change in health will improve Outcome: Not Progressing Goal: Ability to  identify appropriate support needs will improve Outcome: Not Progressing

## 2020-12-08 NOTE — Progress Notes (Signed)
  Speech Language Pathology Treatment: Dysphagia  Patient Details Name: Brad [name] MRN: [id number] DOB: 12/17/1946 Today's Date: 12/08/2020 Time: 7035-0093 SLP Time Calculation (min) (ACUTE ONLY): 11 min  Assessment / Plan / Recommendation Clinical Impression  Pt is alert but less communicative and still not following commands. He is swallowing some of the water today but orally holding the purees, requiring suction for complete removal. Now that he is swallowing a little bit he is also exhibiting coughing that is concerning for reduced airway protection as well as multiple swallows and frequent eructation that could be concerning for pharyngeal and/or esophageal dysphagia. Continue to recommend NPO. Note that palliative care is following pt and that he would not want a feeding tube. If family opts for comfort feeds, would favor starting liquids over solids (with careful hand feeding) given that he is at least swallowing these, even though there are signs of aspiration. Will continue to follow as indicated.    HPI HPI: Pt is a 74 yo male presenting with AMS. CTH/CTA negative for acute findings; MRI pending. Per MD, there is concern for breakthrough seizure due to missed medications. PMH includes: prostate ca, anemia, arthritis, dementia, depression, DM, HTN, seizures, substance abuse. Previous MBS in June 2020 revealed normal oropharyngeal swallow but with pt c/o globus sensation. Esophagram the same day revealed moderate esophageal dysmotility.      SLP Plan  Continue with current plan of care       Recommendations  Diet recommendations: NPO Medication Administration: Via alternative means                 Oral Care Recommendations: Oral care QID Follow up Recommendations: Skilled Nursing facility SLP Visit Diagnosis: Cognitive communication deficit (G18.299) Plan: Continue with current plan of care       GO                Brad [name]., M.A. [facility] Pager [phone] Office [phone]  12/08/2020, 12:21 PM

## 2020-12-09 DIAGNOSIS — F0390 Unspecified dementia without behavioral disturbance: Secondary | ICD-10-CM

## 2020-12-09 MED ORDER — POTASSIUM CHLORIDE 10 MEQ/100ML IV SOLN
10.0000 meq | INTRAVENOUS | Status: AC
  Administered 2020-12-09 (×2): 10 meq via INTRAVENOUS
  Filled 2020-12-09 (×2): qty 100

## 2020-12-09 MED ORDER — MAGNESIUM SULFATE 2 GM/50ML IV SOLN
2.0000 g | Freq: Once | INTRAVENOUS | Status: AC
  Administered 2020-12-09: 10:00:00 2 g via INTRAVENOUS
  Filled 2020-12-09: qty 50

## 2020-12-09 NOTE — Progress Notes (Signed)
Occupational Therapy Treatment Patient Details Name: Brad [name] MRN: [id number] DOB: 1946-07-12 Today's Date: 12/09/2020    History of present illness 74 yo male presenting with AMS. CTH/CTA negative for acute findings; MRI pending. Per MD, there is concern for breakthrough seizure due to missed medications. 12/19 EEG 2 focal motor seizures (RUE rhythmic twitching) suggestive of PRES. PMH includes: prostate ca, anemia, arthritis, dementia, depression, DM, HTN, seizures, substance abuse.   OT comments  Patient met lying supine in bed with head tilt to L. Maximal encouragement to open eyes with noted swelling to L eye. Rn aware. Patient with continued deficits including inability to follow 1-step verbal commands consistently, generalized weakness, severe contractures to RUE at shoulder, elbow, wrist, and digits, and resistance to passive movement of RUE and RLE. Patient with repetitive speech perseverating on the word "ok" but unable to communicate needs. Patient found to have redness at ball of L foot and heel. Patient also with rigidity throughout all extremities and cervical neck. Patient unable to turn head to R despite cueing. Patient repositioned in R side-lying to encourage soft tissue elongation of cervical neck and prevent further contractures. Pillows positioned under R arm, between legs and beneath feet to prevent skin breakdown. OT to see patient for 1 more treatment session to assess participation in functional activities before signing off. Patient will most likely require LTC.     Follow Up Recommendations  SNF (pending patient progress. Patient may need LTC.)    Equipment Recommendations  Other (comment) (TBD)    Recommendations for Other Services      Precautions / Restrictions Precautions Precautions: Fall;Other (comment) Precaution Comments: Seizures Required Braces or Orthoses: Other Brace Other Brace: R palm guard Restrictions Weight Bearing Restrictions: No        Mobility Bed Mobility Overal bed mobility: Needs Assistance Bed Mobility: Rolling Rolling: Total assist         General bed mobility comments: Total A grossly. Patient with minimal initiation this date to roll R.  Transfers                 General transfer comment: Did not progress beyond bed level for patient/therapist safety. Patient with facial grimacing with general movement.    Balance                                           ADL either performed or assessed with clinical judgement   ADL       Grooming: Total assistance                                 General ADL Comments: Total A grossly for self-care tasks. Patient unable to assist.     Vision       Perception     Praxis      Cognition Arousal/Alertness: Lethargic Behavior During Therapy: Flat affect Overall Cognitive Status: No family/caregiver present to determine baseline cognitive functioning                                 General Comments: Unable to follow 1-step verbal commands consistently. Sister states that patient was able to hold purposeful conversations at baseline.        Exercises Exercises: Other exercises Other Exercises Other Exercises:  Gentle cervical PROM   Shoulder Instructions       General Comments Redness at L ball of foot and heel. Repositioned and BLE floated to prevent skin breakdown.    Pertinent Vitals/ Pain       Pain Assessment: Faces Faces Pain Scale: Hurts little more Pain Location: RUE/RLE and cervical neck with PROM. Pain Descriptors / Indicators: Guarding;Grimacing Pain Intervention(s): Monitored during session;Repositioned  Home Living                                          Prior Functioning/Environment              Frequency  Other (comment) (1 more OT session then sign off)        Progress Toward Goals  OT Goals(current goals can now be found in the care plan  section)  Progress towards OT goals: Not progressing toward goals - comment (Patient with no initiation for bed mobility. Unable to follow 1-step verbal commands consistently or participate with therapy efforts.)  Acute Rehab OT Goals Patient Stated Goal: Unable OT Goal Formulation: Patient unable to participate in goal setting Time For Goal Achievement: 12/21/20 Potential to Achieve Goals: Poor ADL Goals Pt Will Perform Eating: with max assist;sitting;bed level Pt Will Perform Grooming: with max assist;sitting;bed level Additional ADL Goal #1: Patient will tolerate R palm guard for 6-8hrs to maintain skin integrity with skin checks every 2hrs.  Plan Discharge plan remains appropriate    Co-evaluation                 AM-PAC OT "6 Clicks" Daily Activity     Outcome Measure   Help from another person eating meals?: Total Help from another person taking care of personal grooming?: Total Help from another person toileting, which includes using toliet, bedpan, or urinal?: Total Help from another person bathing (including washing, rinsing, drying)?: Total Help from another person to put on and taking off regular upper body clothing?: Total Help from another person to put on and taking off regular lower body clothing?: Total 6 Click Score: 6    End of Session    OT Visit Diagnosis: Other abnormalities of gait and mobility (R26.89);Muscle weakness (generalized) (M62.81);Ataxia, unspecified (R27.0);Feeding difficulties (R63.3);Other symptoms and signs involving cognitive function;Adult, failure to thrive (R62.7)   Activity Tolerance Patient limited by lethargy;Patient limited by pain   Patient Left in bed;with call bell/phone within reach;with bed alarm set;Other (comment) (Patient positioned in R-sidelying)   Nurse Communication          Time: 7353-2992 OT Time Calculation (min): 13 min  Charges: OT General Charges $OT Visit: 1 Visit OT Treatments $Therapeutic Activity:  8-22 mins  Talisa Petrak H. OTR/L Supplemental OT, Department of rehab services [phone]   Elwyn Lowden R H. 12/09/2020, 12:53 PM

## 2020-12-09 NOTE — TOC Initial Note (Signed)
Transition of Care [facility]) - Initial/Assessment Note    Patient Details  Name: Brad [name] MRN: [id number] Date of Birth: [birth year]  [facility] CM/SW Contact:    Pollie Friar, RN Phone Number: 12/09/2020, 3:38 PM  Clinical Narrative:                 CM consulted for residential hospice at [facility]. CM has sent referral to [facility] with [facility]. [facility] currently doesn't have a bed and most likely wont until after the holiday. He is on the waiting list.  TOC following.  Expected Discharge Plan: [place] Barriers to Discharge: Hospice Bed not available   Patient Goals and CMS Choice     Choice offered to / list presented to : Sibling  Expected Discharge Plan and Services Expected Discharge Plan: [place] In-house Referral: Clinical Social Work Discharge Planning Services: CM Consult   Living arrangements for the past 2 months: [place]                                      Prior Living Arrangements/Services Living arrangements for the past 2 months: Single Family Home Lives with:: Siblings                   Activities of Daily Living      Permission Sought/Granted                  Emotional Assessment              Admission diagnosis:  Seizure ([employer]) [R56.9] Breakthrough seizure ([place]) [G40.919] Patient Active Problem List   Diagnosis Date Noted  . Pressure injury of skin 12/06/2020  . Seizure ([place]) 12/05/2020  . Diabetes ([place]) 12/05/2020  . Breakthrough seizure ([place]) 12/05/2020  . Cervical spinal stenosis 12/14/2017  . Abnormal CT scan, neck 12/14/2017  . History of stroke 12/12/2017  . Hyperlipidemia 12/12/2017  . Seizure disorder ([place]) 12/12/2017  . KNEE PAIN, RIGHT 06/11/2009  . NECK PAIN 06/11/2009  . Hypertension 02/13/2008  . DENTAL CARIES 02/11/2008   PCP:  [name], DO Pharmacy:   [facility] [place], [place]  [name] [place] [address] Phone: [phone] Fax: [phone]     Social Determinants of Health (SDOH) Interventions    Readmission Risk Interventions No flowsheet data found.

## 2020-12-09 NOTE — Progress Notes (Addendum)
PROGRESS NOTE    Brad [name]  [id number] DOB: [birth year] DOA: 12/04/2020 PCP: [name], DO   Brief Narrative:  Patient is 74 year old male with past medical history of prostate cancer, depression, type 2 diabetes mellitus, hypertension, hyperlipidemia, seizure, substance abuse presents to emergency department with altered mental status.  Upon arrival to ED: Patient's blood pressure was noted to be systolic [id number], remainder vital signs were stable.  WBC: 9.9, sodium 138, potassium 4.4, blood glucose: 75, valproic acid subtherapeutic at 13, COVID-19 and influenza Pallin: Negative.  Head CT negative for acute intracranial abnormality.  CTA head and neck negative for LVO or high-grade stenosis of intracranial arteries.  Unchanged 60% stenosis of proximal left internal carotid artery secondary to mixed density atherosclerosis.  Normal CT perfusion.  Patient was seen by neurology and after speaking to his family patient presentation was felt to be due to seizure-like activity at home.  Patient admitted for further evaluation and management.  Patient was given IV valproate to 50 mg twice daily.  Due to patient's poor p.o. intake palliative care was consulted.  Patient's daughter chosen to make her father DNR.  It has also been noted that the patient's living will documents that he would not want artificial means of feeding him.  Assessment & Plan:   Breakthrough seizures: -Likely secondary to medication noncompliance -CT head and CTA head and neck: Negative for acute findings. -Valproic acid level: Subtherapeutic. -Patient was given loading dose of IV valproate 600 mg in the ED.  Continue scheduled Depakene 250 3 times daily-as per neurology recommendations.   -Continue IV Ativan 2 mg as needed for seizures like activity. -EEG is positive for seizures. -Consult PT/OT recommend SNF id able to participate, if not then he needs LTAC. -Monitor vitals closely -On seizure/aspiration  precautions  Dysphagia: -Evaluated by speech therapy recommended to keep him n.p.o. -Continue D5 at 100 cc/hr  Hypertensive urgency: -Patient's blood pressure improved after IV hydralazine. -Monitor blood pressure closely.  Continue to hold home p.o. BP meds as patient is NPO.  History of substance abuse: UDS negative this time.  Depression/hyperlipidemia/history of prostate cancer: -Continue to hold p.o. home meds at this time as patient is NPO.  Hypokalemia: Replenished.    Hypomagnesemia: Replenished  Normocytic anemia: -Baseline hemoglobin between 10-11.  Hemoglobin 9.3 -Continue to monitor H&H closely and transfuse as needed.  Thrombocytopenia: Platelet 130.  No active bleeding  Goals of care: Palliative is on board-appreciate assistance.  Patient is DNR/DNI.  Transfer to [facility] when bed available. -Discontinue morning labs as per patient's family wishes -Morphine 5 mg every 3 hours as needed for pain or dyspnea.  DVT prophylaxis: Lovenox Code Status: DNR Family Communication:  None present at bedside.  Plan of care discussed with patient in length and he verbalized understanding and agreed with it.  I talked to patient's sister-who was very rude to me on the phone-she wants to start patient on tube feedings although patient has living will which stated no tube feedings however she keep talking rude to me on the phone& said she will talk to higher authorities and complaints about the [facility]. I will involve ethics to help with this difficult situation.  Disposition Plan: SNF  Consultants:   Neurology  Procedures:  CT head EEG Antimicrobials:   None  Status is: Inpatient   Dispo: The patient is from: Home              Anticipated d/c is to: SNF  Anticipated d/c date is: 2 days              Patient currently is not medically stable to d/c.   Subjective: Patient seen and examined.  Sleepy but arousable.  He is not communicative and not  following commands.  No acute events overnight.  No seizures, remained afebrile. Objective: Vitals:   12/08/20 2026 12/09/20 0001 12/09/20 0451 12/09/20 0759  BP: (!) 146/63 (!) 170/75 (!) 157/65 127/72  Pulse: 67 81 66 72  Resp: 20 18 17 16   Temp: 97.9 F (36.6 C) 98.4 F (36.9 C) 98.3 F (36.8 C) 97.7 F (36.5 C)  TempSrc: Axillary Oral Oral Axillary  SpO2: 100% 99% 99% 98%  Weight:      Height:        Intake/Output Summary (Last 24 hours) at 12/09/2020 1025 Last data filed at 12/09/2020 0451 Gross per 24 hour  Intake --  Output 1250 ml  Net -1250 ml   Filed Weights   12/04/20 2310 12/04/20 2312  Weight: 58.3 kg 58.3 kg    Examination:  General exam: Appears calm and comfortable, on room air, sleepy, noncommunicative, not following commands, thin and lean respiratory system: Clear to auscultation. Respiratory effort normal. Cardiovascular system: S1 & S2 heard, RRR. No JVD, murmurs, rubs, gallops or clicks. No pedal edema. Gastrointestinal system: Abdomen is nondistended, soft and nontender. No organomegaly or masses felt. Normal bowel sounds heard. Central nervous system: Sleepy, arousable, not following commands  skin: No rashes, lesions or ulcers    Data Reviewed: I have personally reviewed following labs and imaging studies  CBC: Recent Labs  Lab 12/04/20 2314 12/04/20 2315 12/06/20 0304 12/08/20 0910  WBC  --  9.9 11.9* 6.2  NEUTROABS  --  8.3* 9.4* 3.7  HGB 12.9* 11.7* 10.7* 9.3*  HCT 38.0* 36.5* 33.2* 28.3*  MCV  --  97.9 94.6 95.3  PLT  --  206 182 720*   Basic Metabolic Panel: Recent Labs  Lab 12/04/20 2314 12/04/20 2315 12/06/20 0304 12/08/20 0910 12/08/20 1625  NA 140 138 139 139  --   K 4.5 4.4 3.7 2.9*  --   CL 102 101 100 102  --   CO2  --  22 28 28   --   GLUCOSE 73 75 128* 93  --   BUN 28* 20 15 6*  --   CREATININE 0.90 0.94 0.94 0.77  --   CALCIUM  --  9.1 8.9 8.2*  --   MG  --   --   --   --  1.2*   GFR: Estimated  Creatinine Clearance: 67.8 mL/min (by C-G formula based on SCr of 0.77 mg/dL). Liver Function Tests: Recent Labs  Lab 12/04/20 2315  AST 28  ALT 17  ALKPHOS 70  BILITOT 1.1  PROT 7.0  ALBUMIN 3.6   No results for input(s): LIPASE, AMYLASE in the last 168 hours. Recent Labs  Lab 12/06/20 0304  AMMONIA 32   Coagulation Profile: Recent Labs  Lab 12/04/20 2315  INR 1.2   Cardiac Enzymes: No results for input(s): CKTOTAL, CKMB, CKMBINDEX, TROPONINI in the last 168 hours. BNP (last 3 results) No results for input(s): PROBNP in the last 8760 hours. HbA1C: No results for input(s): HGBA1C in the last 72 hours. CBG: Recent Labs  Lab 12/04/20 2308 12/05/20 0142  GLUCAP 73 86   Lipid Profile: No results for input(s): CHOL, HDL, LDLCALC, TRIG, CHOLHDL, LDLDIRECT in the last 72 hours. Thyroid Function Tests: No  results for input(s): TSH, T4TOTAL, FREET4, T3FREE, THYROIDAB in the last 72 hours. Anemia Panel: Recent Labs    12/08/20 1625  FERRITIN 240  TIBC 175*  IRON 32*   Sepsis Labs: No results for input(s): PROCALCITON, LATICACIDVEN in the last 168 hours.  Recent Results (from the past 240 hour(s))  Resp Panel by RT-PCR (Flu A&B, Covid) Nasopharyngeal Swab     Status: None   Collection Time: 12/05/20 12:18 AM   Specimen: Nasopharyngeal Swab; Nasopharyngeal(NP) swabs in vial transport medium  Result Value Ref Range Status   SARS Coronavirus 2 by RT PCR NEGATIVE NEGATIVE Final    Comment: (NOTE) SARS-CoV-2 target nucleic acids are NOT DETECTED.  The SARS-CoV-2 RNA is generally detectable in upper respiratory specimens during the acute phase of infection. The lowest concentration of SARS-CoV-2 viral copies this assay can detect is 138 copies/mL. A negative result does not preclude SARS-Cov-2 infection and should not be used as the sole basis for treatment or other patient management decisions. A negative result may occur with  improper specimen collection/handling,  submission of specimen other than nasopharyngeal swab, presence of viral mutation(s) within the areas targeted by this assay, and inadequate number of viral copies(<138 copies/mL). A negative result must be combined with clinical observations, patient history, and epidemiological information. The expected result is Negative.  Fact Sheet for Patients:  EntrepreneurPulse.com.au  Fact Sheet for Healthcare Providers:  IncredibleEmployment.be  This test is no t yet approved or cleared by the [place] FDA and  has been authorized for detection and/or diagnosis of SARS-CoV-2 by FDA under an Emergency Use Authorization (EUA). This EUA will remain  in effect (meaning this test can be used) for the duration of the COVID-19 declaration under Section 564(b)(1) of the Act, 21 U.S.C.section 360bbb-3(b)(1), unless the authorization is terminated  or revoked sooner.       Influenza A by PCR NEGATIVE NEGATIVE Final   Influenza B by PCR NEGATIVE NEGATIVE Final    Comment: (NOTE) The Xpert Xpress SARS-CoV-2/FLU/RSV plus assay is intended as an aid in the diagnosis of influenza from Nasopharyngeal swab specimens and should not be used as a sole basis for treatment. Nasal washings and aspirates are unacceptable for Xpert Xpress SARS-CoV-2/FLU/RSV testing.  Fact Sheet for Patients: EntrepreneurPulse.com.au  Fact Sheet for Healthcare Providers: IncredibleEmployment.be  This test is not yet approved or cleared by the [place] FDA and has been authorized for detection and/or diagnosis of SARS-CoV-2 by FDA under an Emergency Use Authorization (EUA). This EUA will remain in effect (meaning this test can be used) for the duration of the COVID-19 declaration under Section 564(b)(1) of the Act, 21 U.S.C. section 360bbb-3(b)(1), unless the authorization is terminated or revoked.  Performed at [facility], [place] [address] [place], [place] [address]       Radiology Studies: No results found.  Scheduled Meds: . enoxaparin (LOVENOX) injection  40 mg Subcutaneous Daily  . lubriderm seriously sensitive   Topical Daily   Continuous Infusions: . dextrose 5% lactated ringers 100 mL/hr at 12/09/20 0350  . magnesium sulfate bolus IVPB 2 g (12/09/20 0931)  . valproate sodium 250 mg (12/09/20 0619)     LOS: 4 days   Time spent: 30 minutes  Charo Philipp Loann Quill, MD [facility]  If 7PM-7AM, please contact night-coverage [email or web address] 12/09/2020, 10:25 AM

## 2020-12-09 NOTE — Care Management Important Message (Signed)
Important Message  Patient Details  Name: Brad [name] MRN: [id number] Date of Birth: March 15, 1946   Medicare Important Message Given:  Yes     Orbie Pyo 12/09/2020, 3:19 PM

## 2020-12-09 NOTE — Progress Notes (Signed)
Daily Progress Note   Patient Name: Brad [name]       Date: 12/09/2020 DOB: September 07, 1946  Age: 74 y.o. MRN#: [id number] Attending Physician: Mckinley Jewel, MD Primary Care Physician: [name], DO Admit Date: 12/04/2020  Reason for Follow-up: continued GOC discussion  Subjective: Sister Oren Binet is at bedside. She is inquiring why the patient is not receiving nutrition. I gently remind her that he has not been awake enough to safely eat/drink. She then states "so y'all are just going to lay there and let him die". She inquires if he can be given nutrition via alternative means, and makes reference to a feeding tube.   I gently remind [name] (as was discussed during the initial [place] discussion) that a feeding tube would be against the patient's wishes, as addressed in his living will.   "If I am unable to make or communicate health care decisions, I desire that my life not be prolonged by life-prolonging measures in the following situations: I suffer from advanced dementia or any other condition which results in the substantial loss of my ability to think, and my doctors determine that, to a high degree of medical certainty, this is not going to get better"  "Even though I do not want my life prolonged by other life-prolonging measures in the situations I have initialed in section 1 above, I do want to receive tube feeding in those situations (initial only if you want to receive tube feeding in those situations)" (Patient did not initial this section, indicating he would NOT want tube feeding.)  Patient did initial the following statement: "Follow this living will. My health care agent cannot make decisions that are different from what I have stated in this living will."  Reviewed  PMH and current hospital status with [name]. Discussed neurology impression from 12/20 -- "advanced dementia and debility with seizures". Discussed that he has not improved even with increased dosing of seizure medications. Discussed again his poor functional status at baseline.   Discussed benefits versus burdens of artificial feeding. Emphasized that artificial feeding has not been shown to prolong life or promote quality of life in patients with a progressive or irreversible condition.   [name] becomes very agitated during our discussion. States that "dogs are treated better than this". She states "I don't care what his living  will says". She states she is "not convinced" that he is not going to improve and wants "proof". She states "y'all are just trying to push him out to have another bed".   I let [name] know I would ask the hospitalist physician to speak with her.   Length of Stay: 4  Current Medications: Scheduled Meds:  . enoxaparin (LOVENOX) injection  40 mg Subcutaneous Daily  . lubriderm seriously sensitive   Topical Daily    Continuous Infusions: . dextrose 5% lactated ringers 100 mL/hr at 12/09/20 0350  . valproate sodium 250 mg (12/09/20 0619)    PRN Meds: acetaminophen, hydrALAZINE, LORazepam, morphine CONCENTRATE, temazepam  Physical Exam Vitals reviewed.  Constitutional:      General: He is not in acute distress.    Appearance: He is cachectic. He is ill-appearing.  Cardiovascular:     Rate and Rhythm: Normal rate. Rhythm irregularly irregular.     Comments: A-fib Pulmonary:     Effort: Pulmonary effort is normal.  Neurological:     Motor: Weakness present.     Comments: somnolent Does not follow commands             Vital Signs: BP 127/72 (BP Location: Left Arm)   Pulse 70   Temp 97.9 F (36.6 C) (Oral)   Resp 16   Ht 6\' 1"  (1.854 m)   Wt 58.3 kg   SpO2 100%   BMI 16.96 kg/m  SpO2: SpO2: 100 % O2 Device: O2 Device: Room Air O2 Flow  Rate: O2 Flow Rate (L/min): 2 L/min  Intake/output summary:   Intake/Output Summary (Last 24 hours) at 12/09/2020 1446 Last data filed at 12/09/2020 0451 Gross per 24 hour  Intake --  Output 800 ml  Net -800 ml   LBM: Last BM Date: 12/07/20 Baseline Weight: Weight: 58.3 kg Most recent weight: Weight: 58.3 kg       Palliative Assessment/Data: PPS 10%      Palliative Care Assessment & Plan   HPI/Patient Profile:74 y.o.malewith past medical history of seizures, prostate cancer, depression, type 2 diabetes, hypertension, hyperlipidemia, and substance abuse. He presented to the emergency departmenton12/18/2021with altered mental status. ED Course:Valproic acid level subtherapeutic at 13. Head CT negative for acute intracranial abnormality. CTA head and neck negative for LVO or high-grade stenosis of the intracranial arteries. Normal CT perfusion. Patient was seen by neurology and felt patient's presentation was due to seizure-like activity at home. Patient admitted to [facility] for further management of breakthrough seizure likely due to medication non-adherence.   Assessment: - breakthrough seizure - dysarthria/dysphagia - hypertensive urgency - type 2 DM - history of substance abuse - depression/hyperlipidemia/history of prostate cancer - failure to thrive, chronic debility  Recommendations/Plan: - cancel referral to [facility] - continue current medical care - artificial feeding would be in conflict with patient's wishes as addressed in his living will - Dr. Doristine Bosworth is aware of the situation and will speak with sister - if sister continues to request a feeding tube or other life-prolonging measures, would recommend ethics consult - PMT will continue to follow  Goals of Care and Additional Recommendations: Limitations on Scope of Treatment: No Artificial Feeding  Code Status: DNR  Prognosis:  < 2 weeks  Discharge Planning: To Be Determined  Care plan was  discussed with Dr. Doristine Bosworth, nursing  Thank you for allowing the Palliative Medicine Team to assist in the care of this patient.   Total Time 68 minutes Prolonged Time Billed  yes  Greater than 50%  of this time was spent counseling and coordinating care related to the above assessment and plan.  Lavena Bullion, NP  Please contact Palliative Medicine Team phone at [phone] for questions and concerns.

## 2020-12-09 NOTE — Progress Notes (Signed)
Engineer, maintenance [facility]) Hospital Liaison note.   Received request from [place] for family interest in [facility] [place] is unable to offer a room today. This liaison offered [place] due to wait list and at this time family has declined due to proximity.   Hospital Liaison will follow up tomorrow or sooner if a room becomes available.   A Please do not hesitate to call with questions.   Thank you,  Clementeen Hoof, BSN, [facility] [phone]

## 2020-12-10 LAB — VALPROIC ACID LEVEL: Valproic Acid Lvl: 56 ug/mL (ref 50.0–100.0)

## 2020-12-10 NOTE — Progress Notes (Signed)
Neurology Consultation Reason for Consult: debility/dementia Referring Physician: Dr. Doristine Bosworth  CC: dementia/delirium  History is obtained from: chart  HPI: Brad [name] is a 74 y.o. male whom I saw earlier this week. He has not changed appreciably. Still unable to participate with the examination or provide any history. He does not nod to yes/no questions or follow any commands.  ROS: Unable to obtain due to altered mental status.   Past Medical History:  Diagnosis Date  . Anemia   . Arthritis   . Cancer [facility]) 2017   prostate  . Depression   . Diabetes mellitus without complication ([place])   . History of stomach ulcers   . Hypertension   . Seizures ([place])   . Substance abuse ([place])     Family History  Problem Relation Age of Onset  . Seizures Neg Hx     Social History:  reports that he has quit smoking. His smoking use included cigarettes. He has never used smokeless tobacco. He reports previous alcohol use of about 2.0 - 3.0 standard drinks of alcohol per week. He reports previous drug use. Drug: Marijuana.   Exam: Current vital signs: BP 131/66 (BP Location: Left Arm)   Pulse 69   Temp 98.4 F (36.9 C) (Oral)   Resp 14   Ht 6\' 1"  (1.854 m)   Wt 58.3 kg   SpO2 99%   BMI 16.96 kg/m  Vital signs in last 24 hours: Temp:  [97.9 F (36.6 C)-99.7 F (37.6 C)] 98.4 F (36.9 C) (12/24 1252) Pulse Rate:  [63-82] 69 (12/24 1252) Resp:  [12-20] 14 (12/24 1252) BP: (130-176)/(66-87) 131/66 (12/24 1252) SpO2:  [96 %-99 %] 99 % (12/24 1252)   Physical Exam  Constitutional: Appears well-developed and well-nourished.  Psych: Affect appropriate to situation Eyes: No scleral injection HENT: No OP obstrucion MSK: no joint deformities.  Cardiovascular: Normal rate and regular rhythm.  Respiratory: Effort normal, non-labored breathing GI: Soft.  No distension. There is no tenderness.  Skin: WDI  Neuro: Mental Status: Patient is somnolent. He will open his eyes only  momentarily after vigorous stimulation. Grunts but makes no purposeful responses. Cranial Nerves: Forced eye closure when I attempt to examine his eyes. Grunts with unintelligible sounds now and then. Face roughly symmetric. Will glace at me when he opens his eyes, then closes them again. Motor: Increased tone throughout. LE > UE contractures. When I elevate his arms, they will drift slowly back to the bed. Moves legs spontaneously to minimal extent, but not able to raise them on command, wiggle feet on command, etc. Sensory: Noxious stimulation not applied. He opens eyes briefly to nonpainful tactile stimulation. Deep Tendon Reflexes: 2+ in UE's Cerebellar: He's unable to cooperate  I have reviewed labs in epic and the results pertinent to this consultation are  I have reviewed the images obtained  Impression: - Late stage dementia with severely compromised ability to eat, drink, vocalize and interact with his environment. - He has a living will that expresses his desire not to receive supplemental feedings in this situation (see Palliative Care quotes from 12/09/20 note). - Brad [name] likely has a dementia syndrome, based on careful review of his chart - In addition, he very likely has seizure disorder, and this produced acute delirium/encephalopathy now manifested as a protracted postictal encephalopathy - It may take weeks to reach maximum medical improvement after an encephalopthy. He has only been in the hospital for 5 days.  Recommendations: 1) I suggest a TEMPORARY feeding  tube for up to 7-10 days for medications and nutrition 2) He has some symptoms that suggest catatonia (the increased tone and gradual drooping of his arms after I raise them); perhaps an empiric trial of ativan 1-2 mg TID could be considered x 24-48 hours. 3) Re-check depakote level, and adjust level to 60-80 range as needed (was 49 when last checked before dose was raised). 4) Repeat EEG to look for ongoing  seizures 5) If temporary FT/NG is placed, you could convert depacon iv over to elixir via FT at same dose 6) may also consider changing depakote over to phenytoin/cerebyx which is not sedating and which may be a more effective anticonvulsant than the low-dose depakote he's on now.  Discussion: This is a challenging situation. Brad [name] has moderately advanced dementia based on chart review, and was in bed "23 hours a day" back in June of this year when last assessed by his outpatient neurologist. He has expressed a desire not to have a feeding tube, but the acute vs chronic use of a feeding tube is not specified in the advanced directive document (personally reviewed). Typically a short term feeding tube placement for a maximum of 10-14 days could potentially enable him to recover enough function to swallow, avoiding the need for long-term FT placement. As his POA/sister is adamant about feeding him, but we cannot legally override the issue of permanent tube feeding, short term feeds may be a reasonable compromise.  Perfecto Kingdom, MD

## 2020-12-10 NOTE — Progress Notes (Signed)
  Speech Language Pathology Treatment: Dysphagia  Patient Details Name: Brad [name] MRN: [id number] DOB: 22-Jan-1946 Today's Date: 12/10/2020 Time: 4665-9935 SLP Time Calculation (min) (ACUTE ONLY): 14 min  Assessment / Plan / Recommendation Clinical Impression  Pt shows minimal change over the course of the week in terms of swallowing, although he did not have overt coughing with thin liquids today. It is still very effortful and time consuming to get any POs in, and I am suctioning puree out of his mouth. It took him ~10 minutes with Mod-Max cueing to consume ~1-2 ounces of water, and then he stopped taking more in. He would start to suck it up through the straw, but then he would not get it all the way into his mouth to swallow. Discussed with PMT and attending given ongoing discussions regarding [place]. Per that discussion, will initiate clear liquid diet, to be given under full supervision and with careful hand feeding/use of aspiration and esophageal precautions. Will follow briefly to see if he can advance this diet, although prognosis is guarded given minimal change so far.   HPI HPI: Pt is a 74 yo male presenting with AMS. CTH/CTA negative for acute findings; MRI pending. Per MD, there is concern for breakthrough seizure due to missed medications. PMH includes: prostate ca, anemia, arthritis, dementia, depression, DM, HTN, seizures, substance abuse. Previous MBS in June 2020 revealed normal oropharyngeal swallow but with pt c/o globus sensation. Esophagram the same day revealed moderate esophageal dysmotility.      SLP Plan  Continue with current plan of care       Recommendations  Diet recommendations: Other(comment) (offer thin liquids) Medication Administration: Via alternative means (or in liquid form) Supervision: Full supervision/cueing for compensatory strategies;Staff to assist with self feeding Compensations: Minimize environmental distractions;Slow rate;Small  sips/bites Postural Changes and/or Swallow Maneuvers: Seated upright 90 degrees;Upright 30-60 min after meal                Oral Care Recommendations: Oral care QID Follow up Recommendations: Skilled Nursing facility SLP Visit Diagnosis: Dysphagia, unspecified (R13.10) Plan: Continue with current plan of care       GO                Osie Bond., M.A. [place] [facility] Pager [phone] Office [phone]  12/10/2020, 10:25 AM

## 2020-12-10 NOTE — Progress Notes (Signed)
PROGRESS NOTE    Brad [name]  [id number] DOB: 20-Dec-1945 DOA: 12/04/2020 PCP: [name], DO   Brief Narrative:  Brad [name] is 74 year old male with past medical history of prostate cancer, depression, type 2 diabetes mellitus, hypertension, hyperlipidemia, seizure, substance abuse presents to emergency department with altered mental status.  Upon arrival to ED: Brad [name]'s blood pressure was noted to be systolic [id number], remainder vital signs were stable.  WBC: 9.9, sodium 138, potassium 4.4, blood glucose: 75, valproic acid subtherapeutic at 13, COVID-19 and influenza Pallin: Negative.  Head CT negative for acute intracranial abnormality.  CTA head and neck negative for LVO or high-grade stenosis of intracranial arteries.  Unchanged 60% stenosis of proximal left internal carotid artery secondary to mixed density atherosclerosis.  Normal CT perfusion.  Brad [name] was seen by neurology and after speaking to his family Brad [name] presentation was felt to be due to seizure-like activity at home.  Brad [name] admitted for further evaluation and management.  Brad [name] was given IV valproate to 50 mg twice daily.  Due to Brad [name]'s poor p.o. intake palliative care was consulted.  Brad [name]'s daughter chosen to make her father DNR.  It has also been noted that the Brad [name]'s living will documents that he would not want artificial means of feeding him.  Assessment & Plan:   Breakthrough seizures: -Likely secondary to medication noncompliance -CT head and CTA head and neck: Negative for acute findings. -Valproic acid level: Subtherapeutic. -Brad [name] was given loading dose of IV valproate 600 mg in the ED.  Continue scheduled Depakene 250 3 times daily-as per neurology recommendations.   -Continue IV Ativan 2 mg as needed for seizures like activity. -EEG is positive for seizures. -Consult PT/OT recommend SNF id able to participate, if not then he needs LTAC. -Consulted neurology on 12/24 recommended to repeat EEG,  check Depakote level-consider changing Depakote over phenytoin/Celebrex and empiric trial of Ativan 1 to 2 mg 3 times daily for 24 to 48 hours.  Dysphagia: -Evaluated by speech therapy recommended to keep him n.p.o. -Continue D5 at 100 cc/hr -Neurology recommended temporary feeding tube for up to 7 to 10 days for medications and nutrition. Placed order for tube placement and consulted dietitian for tube feedings.  Hypertensive urgency: -Brad [name]'s blood pressure improved after IV hydralazine. -Monitor blood pressure closely.  Continue to hold home p.o. BP meds as Brad [name] is NPO.  History of substance abuse: UDS negative this time.  Depression/hyperlipidemia/history of prostate cancer: -Continue to hold p.o. home meds at this time as Brad [name] is NPO.  Hypokalemia: Replenished.    Hypomagnesemia: Replenished  Normocytic anemia: -Baseline hemoglobin between 10-11.  Hemoglobin 9.3 -Continue to monitor H&H closely and transfuse as needed.  Thrombocytopenia: Platelet 130.  No active bleeding  Goals of care: Palliative is on board-appreciate assistance.  Brad [name] is DNR/DNI. Initially Brad [name]'s sister agreeable for hospice care and inclining towards comfort care however on 12/23 she requested for tube feedings which was against Brad [name]'s living will.  12/24: Talked to Brad [name]'s sister on the phone. She was very rude and aggressive towards me. She threatened me that she will sue [place] and the hospital. She will call attorney and media people and bring everyone to the hospital. She was very disrespectful and tells me that she is recording our conversation and bring that up in the media. She also mentioned that "It would not be good if any Caucasian person try to convince her against her decision". I tried to explain her that we are giving Brad [name] IVF & fortunately this  am he also drink some sips of water with SLP however she was very loud & aggressive & does not want to listen anything.  Will place  the order for NG tube & start NG tube feedings for short term.  Need to reassess Brad [name] in 10 to 14 days.  Need to readdress goals of care with the Brad [name]'s family if no improvement in 10 to 14 days.  Plan of care discussed with [facility] (Dr. Roderic Palau) and  Ethics Dr. Verlon Au.  DVT prophylaxis: Lovenox Code Status: DNR Family Communication:  None present at bedside.  Plan of care discussed with Brad [name] in length and he verbalized understanding and agreed with it.  Disposition Plan: SNF  Consultants:   Neurology  Procedures:  CT head EEG Antimicrobials:   None  Status is: Inpatient   Dispo: The Brad [name] is from: Home              Anticipated d/c is to: SNF              Anticipated d/c date is: 2 days              Brad [name] currently is not medically stable to d/c.   Subjective: Brad [name] seen and examined.  Sleepy, opened his eyes with verbal command however goes back to sleep.  He is mute and not following commands.  Vitals:   12/10/20 0833 12/10/20 0949 12/10/20 1043 12/10/20 1252  BP: (!) 176/73 140/75 130/70 131/66  Pulse: 64 66  69  Resp: 14   14  Temp: 99.7 F (37.6 C)   98.4 F (36.9 C)  TempSrc: Oral   Oral  SpO2: 99%   99%  Weight:      Height:        Intake/Output Summary (Last 24 hours) at 12/10/2020 1608 Last data filed at 12/09/2020 1800 Gross per 24 hour  Intake 0 ml  Output --  Net 0 ml   Filed Weights   12/04/20 2310 12/04/20 2312  Weight: 58.3 kg 58.3 kg    Examination:  General exam: Appears calm and comfortable, on room air, sleepy, noncommunicative, not following commands, thin and lean respiratory system: Clear to auscultation. Respiratory effort normal. Cardiovascular system: S1 & S2 heard, RRR. No JVD, murmurs, rubs, gallops or clicks. No pedal edema. Gastrointestinal system: Abdomen is nondistended, soft and nontender. No organomegaly or masses felt. Normal bowel sounds heard. Central nervous system: Sleepy, arousable, not following commands   skin: No rashes, lesions or ulcers    Data Reviewed: I have personally reviewed following labs and imaging studies  CBC: Recent Labs  Lab 12/04/20 2314 12/04/20 2315 12/06/20 0304 12/08/20 0910  WBC  --  9.9 11.9* 6.2  NEUTROABS  --  8.3* 9.4* 3.7  HGB 12.9* 11.7* 10.7* 9.3*  HCT 38.0* 36.5* 33.2* 28.3*  MCV  --  97.9 94.6 95.3  PLT  --  206 182 [id number]   Basic Metabolic Panel: Recent Labs  Lab 12/04/20 2314 12/04/20 2315 12/06/20 0304 12/08/20 0910 12/08/20 1625  NA 140 138 139 139  --   K 4.5 4.4 3.7 2.9*  --   CL 102 101 100 102  --   CO2  --  22 28 28   --   GLUCOSE 73 75 128* 93  --   BUN 28* 20 15 6*  --   CREATININE 0.90 0.94 0.94 0.77  --   CALCIUM  --  9.1 8.9 8.2*  --   MG  --   --   --   --  1.2*   GFR: Estimated Creatinine Clearance: 67.8 mL/min (by C-G formula based on SCr of 0.77 mg/dL). Liver Function Tests: Recent Labs  Lab 12/04/20 2315  AST 28  ALT 17  ALKPHOS 70  BILITOT 1.1  PROT 7.0  ALBUMIN 3.6   No results for input(s): LIPASE, AMYLASE in the last 168 hours. Recent Labs  Lab 12/06/20 0304  AMMONIA 32   Coagulation Profile: Recent Labs  Lab 12/04/20 2315  INR 1.2   Cardiac Enzymes: No results for input(s): CKTOTAL, CKMB, CKMBINDEX, TROPONINI in the last 168 hours. BNP (last 3 results) No results for input(s): PROBNP in the last 8760 hours. HbA1C: No results for input(s): HGBA1C in the last 72 hours. CBG: Recent Labs  Lab 12/04/20 2308 12/05/20 0142  GLUCAP 73 86   Lipid Profile: No results for input(s): CHOL, HDL, LDLCALC, TRIG, CHOLHDL, LDLDIRECT in the last 72 hours. Thyroid Function Tests: No results for input(s): TSH, T4TOTAL, FREET4, T3FREE, THYROIDAB in the last 72 hours. Anemia Panel: Recent Labs    12/08/20 1625  FERRITIN 240  TIBC 175*  IRON 32*   Sepsis Labs: No results for input(s): PROCALCITON, LATICACIDVEN in the last 168 hours.  Recent Results (from the past 240 hour(s))  Resp Panel by RT-PCR  (Flu A&B, Covid) Nasopharyngeal Swab     Status: None   Collection Time: 12/05/20 12:18 AM   Specimen: Nasopharyngeal Swab; Nasopharyngeal(NP) swabs in vial transport medium  Result Value Ref Range Status   SARS Coronavirus 2 by RT PCR NEGATIVE NEGATIVE Final    Comment: (NOTE) SARS-CoV-2 target nucleic acids are NOT DETECTED.  The SARS-CoV-2 RNA is generally detectable in upper respiratory specimens during the acute phase of infection. The lowest concentration of SARS-CoV-2 viral copies this assay can detect is 138 copies/mL. A negative result does not preclude SARS-Cov-2 infection and should not be used as the sole basis for treatment or other Brad [name] management decisions. A negative result may occur with  improper specimen collection/handling, submission of specimen other than nasopharyngeal swab, presence of viral mutation(s) within the areas targeted by this assay, and inadequate number of viral copies(<138 copies/mL). A negative result must be combined with clinical observations, Brad [name] history, and epidemiological information. The expected result is Negative.  Fact Sheet for Patients:  EntrepreneurPulse.com.au  Fact Sheet for Healthcare Providers:  IncredibleEmployment.be  This test is no t yet approved or cleared by the [place] FDA and  has been authorized for detection and/or diagnosis of SARS-CoV-2 by FDA under an Emergency Use Authorization (EUA). This EUA will remain  in effect (meaning this test can be used) for the duration of the COVID-19 declaration under Section 564(b)(1) of the Act, 21 U.S.C.section 360bbb-3(b)(1), unless the authorization is terminated  or revoked sooner.       Influenza A by PCR NEGATIVE NEGATIVE Final   Influenza B by PCR NEGATIVE NEGATIVE Final    Comment: (NOTE) The Xpert Xpress SARS-CoV-2/FLU/RSV plus assay is intended as an aid in the diagnosis of influenza from Nasopharyngeal swab specimens  and should not be used as a sole basis for treatment. Nasal washings and aspirates are unacceptable for Xpert Xpress SARS-CoV-2/FLU/RSV testing.  Fact Sheet for Patients: EntrepreneurPulse.com.au  Fact Sheet for Healthcare Providers: IncredibleEmployment.be  This test is not yet approved or cleared by the [place] FDA and has been authorized for detection and/or diagnosis of SARS-CoV-2 by FDA under an Emergency Use Authorization (EUA). This EUA will remain in effect (meaning this test can be used) for  the duration of the COVID-19 declaration under Section 564(b)(1) of the Act, 21 U.S.C. section 360bbb-3(b)(1), unless the authorization is terminated or revoked.  Performed at [facility], [place] [address], [place], [place] [address]       Radiology Studies: No results found.  Scheduled Meds: . enoxaparin (LOVENOX) injection  40 mg Subcutaneous Daily  . lubriderm seriously sensitive   Topical Daily   Continuous Infusions: . dextrose 5% lactated ringers 100 mL/hr at 12/10/20 1210  . valproate sodium 250 mg (12/10/20 0623)     LOS: 5 days   Time spent: 30 minutes  Jaymee Tilson Loann Quill, MD [facility]  If 7PM-7AM, please contact night-coverage [email or web address] 12/10/2020, 4:08 PM

## 2020-12-11 ENCOUNTER — Inpatient Hospital Stay (HOSPITAL_COMMUNITY): Payer: Medicare Other

## 2020-12-11 LAB — BASIC METABOLIC PANEL
Anion gap: 10 (ref 5–15)
Anion gap: 13 (ref 5–15)
BUN: <5 mg/dL — ABNORMAL LOW (ref 8–23)
BUN: <5 mg/dL — ABNORMAL LOW (ref 8–23)
CO2: 28 mmol/L (ref 22–32)
CO2: 26 mmol/L (ref 22–32)
Calcium: 7.9 mg/dL — ABNORMAL LOW (ref 8.9–10.3)
Calcium: 7.9 mg/dL — ABNORMAL LOW (ref 8.9–10.3)
Chloride: 94 mmol/L — ABNORMAL LOW (ref 98–111)
Chloride: 93 mmol/L — ABNORMAL LOW (ref 98–111)
Creatinine, Ser: 0.72 mg/dL (ref 0.61–1.24)
Creatinine, Ser: 0.71 mg/dL (ref 0.61–1.24)
GFR, Estimated: >60 mL/min (ref >60)
GFR, Estimated: >60 mL/min (ref >60)
Glucose, Bld: 86 mg/dL (ref 70–99)
Glucose, Bld: 93 mg/dL (ref 70–99)
Potassium: 2.7 mmol/L — CL (ref 3.5–5.1)
Potassium: 2.9 mmol/L — ABNORMAL LOW (ref 3.5–5.1)
Sodium: 132 mmol/L — ABNORMAL LOW (ref 135–145)
Sodium: 132 mmol/L — ABNORMAL LOW (ref 135–145)

## 2020-12-11 LAB — CBC
HCT: 28.5 % — ABNORMAL LOW (ref 39.0–52.0)
Hemoglobin: 9.5 g/dL — ABNORMAL LOW (ref 13.0–17.0)
MCH: 30.6 pg (ref 26.0–34.0)
MCHC: 33.3 g/dL (ref 30.0–36.0)
MCV: 91.9 fL (ref 80.0–100.0)
Platelets: 131 10*3/uL — ABNORMAL LOW (ref 150–400)
RBC: 3.1 MIL/uL — ABNORMAL LOW (ref 4.22–5.81)
RDW: 11.6 % (ref 11.5–15.5)
WBC: 5.7 10*3/uL (ref 4.0–10.5)
nRBC: 0 % (ref 0.0–0.2)

## 2020-12-11 LAB — MAGNESIUM: Magnesium: 1.4 mg/dL — ABNORMAL LOW (ref 1.7–2.4)

## 2020-12-11 MED ORDER — POTASSIUM CHLORIDE 10 MEQ/100ML IV SOLN
10.0000 meq | INTRAVENOUS | Status: AC
  Administered 2020-12-11 (×6): 10 meq via INTRAVENOUS
  Filled 2020-12-11 (×6): qty 100

## 2020-12-11 MED ORDER — VALPROATE SODIUM 500 MG/5ML IV SOLN
500.0000 mg | Freq: Two times a day (BID) | INTRAVENOUS | Status: DC
  Administered 2020-12-11 – 2020-12-13 (×4): 500 mg via INTRAVENOUS
  Filled 2020-12-11 (×5): qty 5

## 2020-12-11 MED ORDER — MAGNESIUM SULFATE 2 GM/50ML IV SOLN
2.0000 g | Freq: Once | INTRAVENOUS | Status: AC
  Administered 2020-12-11: 08:00:00 2 g via INTRAVENOUS
  Filled 2020-12-11: qty 50

## 2020-12-11 NOTE — Procedures (Signed)
Patient Name: Brad [name]  MRN: [id number]  Epilepsy Attending: Lora Havens  Referring Physician/Provider: Dr Early Osmond Date: 12/11/2020 Duration: 23.53 mins  Patient history: 74yo M with dementia and seizures. EEG to evaluate for seizure  Level of alertness: Awake  AEDs during EEG study: VPA  Technical aspects: This EEG study was done with scalp electrodes positioned according to the 10-20 International system of electrode placement. Electrical activity was acquired at a sampling rate of 500Hz  and reviewed with a high frequency filter of 70Hz  and a low frequency filter of 1Hz . EEG data were recorded continuously and digitally stored.   Description: The posterior dominant rhythm consists of 8 Hz activity of moderate voltage (25-35 uV) seen predominantly in posterior head regions, symmetric and reactive to eye opening and eye closing.  EEG showed intermittent generalized 3 to 6 Hz theta-delta slowing.Spike was seen in left temporoparietal region.   Hyperventilation and photic stimulation were not performed.     ABNORMALITY -Intermittent slow, generalized -Spike, left temporoparietal region.   IMPRESSION: This study showed evidence of epileptogenicity arising left temporoparietal region as well as mild diffuse encephalopathy, nonspecific etiology. No seizures were seen throughout the recording.  Ramisa Duman Barbra Sarks

## 2020-12-11 NOTE — Progress Notes (Signed)
EEG completed, results pending. 

## 2020-12-11 NOTE — Progress Notes (Signed)
[facility] ([facility]) Hospital Liaison note.   ° °[facility] is unable to offer a room today. Hospice liaison will update with any change. Please do not hesitate to call with questions.     °A Please do not hesitate to call with questions.     ° °Thank you,     °Mary Anne Robertson, RN, CCM        °[facility] Hospital Liaison (listed on AMION under Hospice /[facility])      °[phone]  °

## 2020-12-11 NOTE — Progress Notes (Addendum)
Placed order for Dietary consult for NG tube placement per MD, due to poor dietary intake. Attempts to page on-call dietician were unsuccessful. No new orders obtained.

## 2020-12-11 NOTE — Plan of Care (Signed)
  Problem: Education: Goal: Knowledge of General Education information will improve Description Including pain rating scale, medication(s)/side effects and non-pharmacologic comfort measures Outcome: Progressing   Problem: Health Behavior/Discharge Planning: Goal: Ability to manage health-related needs will improve Outcome: Progressing   Problem: Clinical Measurements: Goal: Ability to maintain clinical measurements within normal limits will improve Outcome: Progressing Goal: Will remain free from infection Outcome: Progressing Goal: Diagnostic test results will improve Outcome: Progressing Goal: Respiratory complications will improve Outcome: Progressing Goal: Cardiovascular complication will be avoided Outcome: Progressing   Problem: Activity: Goal: Risk for activity intolerance will decrease Outcome: Progressing   Problem: Nutrition: Goal: Adequate nutrition will be maintained Outcome: Progressing   Problem: Coping: Goal: Level of anxiety will decrease Outcome: Progressing   Problem: Elimination: Goal: Will not experience complications related to bowel motility Outcome: Progressing Goal: Will not experience complications related to urinary retention Outcome: Progressing   Problem: Pain Managment: Goal: General experience of comfort will improve Outcome: Progressing   Problem: Safety: Goal: Ability to remain free from injury will improve Outcome: Progressing   Problem: Skin Integrity: Goal: Risk for impaired skin integrity will decrease Outcome: Progressing   Problem: Education: Goal: Expressions of having a comfortable level of knowledge regarding the disease process will increase Outcome: Progressing   Problem: Coping: Goal: Ability to adjust to condition or change in health will improve Outcome: Progressing Goal: Ability to identify appropriate support needs will improve Outcome: Progressing   Problem: Medication: Goal: Risk for medication side  effects will decrease Outcome: Progressing   Problem: Clinical Measurements: Goal: Complications related to the disease process, condition or treatment will be avoided or minimized Outcome: Progressing Goal: Diagnostic test results will improve Outcome: Progressing   Problem: Safety: Goal: Verbalization of understanding the information provided will improve Outcome: Progressing   Problem: Self-Concept: Goal: Level of anxiety will decrease Outcome: Progressing Goal: Ability to verbalize feelings about condition will improve Outcome: Progressing   

## 2020-12-11 NOTE — Treatment Plan (Signed)
Treatment Plan  Patient's most recent depakote level was 47. This is therapeutic, but there is more room to improve seizure control. Since the EEG showed no ongoing seizures, but confirmed seizure tendencies, I favor escalating dose to 500 mg twice a day. This can be given as depakote sprinkles (2x250 mg capsules broken apart twice a day) if he is taking food by mouth. If not, it can be given iv as depacon.  No additional recommendations. Will be available to assist as needed.  Perfecto Kingdom, MD

## 2020-12-11 NOTE — Progress Notes (Signed)
PROGRESS NOTE    Brad [name]  [id number] DOB: [birth year] DOA: 12/04/2020 PCP: [name], DO   Brief Narrative:  Patient is 74 year old male with past medical history of prostate cancer, depression, type 2 diabetes mellitus, hypertension, hyperlipidemia, seizure, substance abuse presents to emergency department with altered mental status.  Upon arrival to ED: Patient's blood pressure was noted to be systolic [id number], remainder vital signs were stable.  WBC: 9.9, sodium 138, potassium 4.4, blood glucose: 75, valproic acid subtherapeutic at 13, COVID-19 and influenza Pallin: Negative.  Head CT negative for acute intracranial abnormality.  CTA head and neck negative for LVO or high-grade stenosis of intracranial arteries.  Unchanged 60% stenosis of proximal left internal carotid artery secondary to mixed density atherosclerosis.  Normal CT perfusion.  Patient was seen by neurology and after speaking to his family patient presentation was felt to be due to seizure-like activity at home.  Patient admitted for further evaluation and management.  Patient was given IV valproate to 50 mg twice daily.  Record patient is bedbound since 2018.  Due to patient's poor p.o. intake palliative care was consulted.  Patient's daughter chosen to make her father DNR.  It has also been noted that the patient's living will documents that he would not want artificial means of feeding him.  Assessment & Plan:   Breakthrough seizures: -Likely secondary to medication noncompliance.  Has a history of underlying dementia. -CT head and CTA head and neck: Negative for acute findings. -Valproic acid level: Subtherapeutic on admission. -Patient was given loading dose of IV valproate 600 mg in the ED.  Continue scheduled Depakene 250 3 times daily-as per neurology recommendations.   -Continue IV Ativan 2 mg as needed for seizures like activity. -EEG is positive for seizures. -Consult PT/OT recommend SNF if able to  participate, if not then he needs LTAC. -Consulted neurology on 12/24: Recommended to repeat EEG and check valproic acid level.  Recommended to change Depakene to phenytoin/Cerebyx and trial of Ativan 3 times daily for 24 to 48 hours.  Appreciate neurology recommendations -Repeat EEG done on 12/25 which shows no evidence of seizures.  Valproic acid level: Therapeutic  Dysphagia: -Evaluated by speech therapy recommended to keep him n.p.o. -Continue D5 at 100 cc/hr -I agree with neurology recommendations for temporary tube feedings for up to 7 to 10 days for medications and nutrition. Placed order for NG tube placement and consulted dietitian for tube feedings.  Hypertension: -Blood pressure elevated this morning.  Will give a dose of IV hydralazine. -Monitor blood pressure closely.  Continue to hold home p.o. BP meds as patient is NPO.  History of substance abuse: UDS negative this time.  Depression/hyperlipidemia/history of prostate cancer: -Continue to hold p.o. home meds at this time as patient is NPO.  Hypokalemia: Potassium 2.7 this morning.  Replenished.  Repeat BMP tomorrow a.m.  Hypomagnesemia: Magnesium: 1.4 this morning.  Replenished.  Repeat magnesium level tomorrow a.m.  Normocytic anemia: -Baseline hemoglobin between 10-11.  Hemoglobin 9.3 -Continue to monitor H&H closely and transfuse as needed.  Thrombocytopenia: Platelet 130-131.  No active bleeding -Repeat CBC tomorrow AM  Severe protein calorie malnutrition: BMI of 16 -We will check albumin level -Start patient on tube feedings-dietitian consulted  Goals of care: Palliative is on board-appreciate assistance.  Patient is DNR/DNI.  We'll start patient on temporary tube feedings.  Need to readdress goals of care with patient's family in 49 to 14 days if no clinical improvement.  DVT prophylaxis: Lovenox Code Status: DNR  Family Communication:  None present at bedside.  Plan of care discussed with patient in length  and he verbalized understanding and agreed with it.  Disposition Plan: SNF  Consultants:   Neurology  Procedures:  CT head EEG Antimicrobials:   None  Status is: Inpatient   Dispo: The patient is from: Home              Anticipated d/c is to: SNF              Anticipated d/c date is: More than 3 days              Patient currently is not medically stable to d/c.   Subjective: Patient seen and examined.  Fortunately he said that  "Iam all right".  No acute events overnight.  No seizures reported.  Remained afebrile  Vitals:   12/11/20 0028 12/11/20 0328 12/11/20 0724 12/11/20 1149  BP: (!) 149/64 (!) 162/89 (!) 154/83 (!) 170/65  Pulse: 70 78 (!) 56 63  Resp: 18 20 16 18   Temp: 98.2 F (36.8 C) 98.7 F (37.1 C) 97.8 F (36.6 C) 98.4 F (36.9 C)  TempSrc: Oral Oral Oral Oral  SpO2: 100% 100% 100% 100%  Weight:      Height:        Intake/Output Summary (Last 24 hours) at 12/11/2020 1311 Last data filed at 12/11/2020 0647 Gross per 24 hour  Intake 410 ml  Output 2175 ml  Net -1765 ml   Filed Weights   12/04/20 2310 12/04/20 2312  Weight: 58.3 kg 58.3 kg    Examination:  General exam: Appears calm and comfortable, on room air, sleepy, thin and lean, opened his eyes for a few seconds and said I'm all right. respiratory system: Clear to auscultation. Respiratory effort normal. Cardiovascular system: S1 & S2 heard, RRR. No JVD, murmurs, rubs, gallops or clicks. No pedal edema. Gastrointestinal system: Abdomen is nondistended, soft and nontender. No organomegaly or masses felt. Normal bowel sounds heard. Central nervous system: Sleepy, arousable momentarily then goes back to sleep, not following commands  skin: No rashes, lesions or ulcers    Data Reviewed: I have personally reviewed following labs and imaging studies  CBC: Recent Labs  Lab 12/04/20 2314 12/04/20 2315 12/06/20 0304 12/08/20 0910 12/11/20 0319  WBC  --  9.9 11.9* 6.2 5.7  NEUTROABS   --  8.3* 9.4* 3.7  --   HGB 12.9* 11.7* 10.7* 9.3* 9.5*  HCT 38.0* 36.5* 33.2* 28.3* 28.5*  MCV  --  97.9 94.6 95.3 91.9  PLT  --  206 182 130* [id number]*   Basic Metabolic Panel: Recent Labs  Lab 12/04/20 2314 12/04/20 2315 12/06/20 0304 12/08/20 0910 12/08/20 1625 12/11/20 0319  NA 140 138 139 139  --  132*  K 4.5 4.4 3.7 2.9*  --  2.7*  CL 102 101 100 102  --  94*  CO2  --  22 28 28   --  28  GLUCOSE 73 75 128* 93  --  86  BUN 28* 20 15 6*  --  <5*  CREATININE 0.90 0.94 0.94 0.77  --  0.72  CALCIUM  --  9.1 8.9 8.2*  --  7.9*  MG  --   --   --   --  1.2* 1.4*   GFR: Estimated Creatinine Clearance: 67.8 mL/min (by C-G formula based on SCr of 0.72 mg/dL). Liver Function Tests: Recent Labs  Lab 12/04/20 2315  AST 28  ALT 17  ALKPHOS 70  BILITOT 1.1  PROT 7.0  ALBUMIN 3.6   No results for input(s): LIPASE, AMYLASE in the last 168 hours. Recent Labs  Lab 12/06/20 0304  AMMONIA 32   Coagulation Profile: Recent Labs  Lab 12/04/20 2315  INR 1.2   Cardiac Enzymes: No results for input(s): CKTOTAL, CKMB, CKMBINDEX, TROPONINI in the last 168 hours. BNP (last 3 results) No results for input(s): PROBNP in the last 8760 hours. HbA1C: No results for input(s): HGBA1C in the last 72 hours. CBG: Recent Labs  Lab 12/04/20 2308 12/05/20 0142  GLUCAP 73 86   Lipid Profile: No results for input(s): CHOL, HDL, LDLCALC, TRIG, CHOLHDL, LDLDIRECT in the last 72 hours. Thyroid Function Tests: No results for input(s): TSH, T4TOTAL, FREET4, T3FREE, THYROIDAB in the last 72 hours. Anemia Panel: Recent Labs    12/08/20 1625  FERRITIN 240  TIBC 175*  IRON 32*   Sepsis Labs: No results for input(s): PROCALCITON, LATICACIDVEN in the last 168 hours.  Recent Results (from the past 240 hour(s))  Resp Panel by RT-PCR (Flu A&B, Covid) Nasopharyngeal Swab     Status: None   Collection Time: 12/05/20 12:18 AM   Specimen: Nasopharyngeal Swab; Nasopharyngeal(NP) swabs in vial  transport medium  Result Value Ref Range Status   SARS Coronavirus 2 by RT PCR NEGATIVE NEGATIVE Final    Comment: (NOTE) SARS-CoV-2 target nucleic acids are NOT DETECTED.  The SARS-CoV-2 RNA is generally detectable in upper respiratory specimens during the acute phase of infection. The lowest concentration of SARS-CoV-2 viral copies this assay can detect is 138 copies/mL. A negative result does not preclude SARS-Cov-2 infection and should not be used as the sole basis for treatment or other patient management decisions. A negative result may occur with  improper specimen collection/handling, submission of specimen other than nasopharyngeal swab, presence of viral mutation(s) within the areas targeted by this assay, and inadequate number of viral copies(<138 copies/mL). A negative result must be combined with clinical observations, patient history, and epidemiological information. The expected result is Negative.  Fact Sheet for Patients:  EntrepreneurPulse.com.au  Fact Sheet for Healthcare Providers:  IncredibleEmployment.be  This test is no t yet approved or cleared by the [place] FDA and  has been authorized for detection and/or diagnosis of SARS-CoV-2 by FDA under an Emergency Use Authorization (EUA). This EUA will remain  in effect (meaning this test can be used) for the duration of the COVID-19 declaration under Section 564(b)(1) of the Act, 21 U.S.C.section 360bbb-3(b)(1), unless the authorization is terminated  or revoked sooner.       Influenza A by PCR NEGATIVE NEGATIVE Final   Influenza B by PCR NEGATIVE NEGATIVE Final    Comment: (NOTE) The Xpert Xpress SARS-CoV-2/FLU/RSV plus assay is intended as an aid in the diagnosis of influenza from Nasopharyngeal swab specimens and should not be used as a sole basis for treatment. Nasal washings and aspirates are unacceptable for Xpert Xpress SARS-CoV-2/FLU/RSV testing.  Fact  Sheet for Patients: EntrepreneurPulse.com.au  Fact Sheet for Healthcare Providers: IncredibleEmployment.be  This test is not yet approved or cleared by the [place] FDA and has been authorized for detection and/or diagnosis of SARS-CoV-2 by FDA under an Emergency Use Authorization (EUA). This EUA will remain in effect (meaning this test can be used) for the duration of the COVID-19 declaration under Section 564(b)(1) of the Act, 21 U.S.C. section 360bbb-3(b)(1), unless the authorization is terminated or revoked.  Performed at [facility], [place] [address],  [place], [place] [address]       Radiology Studies: No results found.  Scheduled Meds: . enoxaparin (LOVENOX) injection  40 mg Subcutaneous Daily  . lubriderm seriously sensitive   Topical Daily   Continuous Infusions: . dextrose 5% lactated ringers 100 mL/hr at 12/10/20 1210  . potassium chloride 10 mEq (12/11/20 0817)  . valproate sodium 250 mg (12/11/20 0955)     LOS: 6 days   Time spent: 30 minutes  Zaryah Seckel Loann Quill, MD [facility]  If 7PM-7AM, please contact night-coverage [email or web address] 12/11/2020, 1:11 PM

## 2020-12-11 NOTE — Progress Notes (Signed)
Notified by lab of critical potassium level: 2.7 at [place] MD notified at [facility]

## 2020-12-12 LAB — BASIC METABOLIC PANEL
Anion gap: 10 (ref 5–15)
BUN: <5 mg/dL — ABNORMAL LOW (ref 8–23)
CO2: 28 mmol/L (ref 22–32)
Calcium: 8.2 mg/dL — ABNORMAL LOW (ref 8.9–10.3)
Chloride: 96 mmol/L — ABNORMAL LOW (ref 98–111)
Creatinine, Ser: 0.63 mg/dL (ref 0.61–1.24)
GFR, Estimated: >60 mL/min (ref >60)
Glucose, Bld: 94 mg/dL (ref 70–99)
Potassium: 3.1 mmol/L — ABNORMAL LOW (ref 3.5–5.1)
Sodium: 134 mmol/L — ABNORMAL LOW (ref 135–145)

## 2020-12-12 LAB — GLUCOSE, CAPILLARY
Glucose-Capillary: 128 mg/dL — ABNORMAL HIGH (ref 70–99)
Glucose-Capillary: 87 mg/dL (ref 70–99)
Glucose-Capillary: 98 mg/dL (ref 70–99)

## 2020-12-12 LAB — PHOSPHORUS: Phosphorus: 2.4 mg/dL — ABNORMAL LOW (ref 2.5–4.6)

## 2020-12-12 LAB — MAGNESIUM: Magnesium: 1.6 mg/dL — ABNORMAL LOW (ref 1.7–2.4)

## 2020-12-12 MED ORDER — BOOST / RESOURCE BREEZE PO LIQD CUSTOM
1.0000 | Freq: Three times a day (TID) | ORAL | Status: DC
  Administered 2020-12-12 – 2020-12-14 (×6): 1 via ORAL
  Filled 2020-12-12: qty 1

## 2020-12-12 MED ORDER — PROSOURCE PLUS PO LIQD
30.0000 mL | Freq: Two times a day (BID) | ORAL | Status: DC
  Administered 2020-12-13 – 2020-12-31 (×32): 30 mL via ORAL
  Filled 2020-12-12 (×38): qty 30

## 2020-12-12 MED ORDER — POTASSIUM PHOSPHATES 15 MMOLE/5ML IV SOLN
10.0000 mmol | Freq: Once | INTRAVENOUS | Status: AC
  Administered 2020-12-12: 15:00:00 10 mmol via INTRAVENOUS
  Filled 2020-12-12: qty 3.33

## 2020-12-12 MED ORDER — POTASSIUM CHLORIDE 10 MEQ/100ML IV SOLN
10.0000 meq | INTRAVENOUS | Status: AC
  Administered 2020-12-12 (×6): 10 meq via INTRAVENOUS
  Filled 2020-12-12 (×6): qty 100

## 2020-12-12 MED ORDER — MAGNESIUM SULFATE 2 GM/50ML IV SOLN
2.0000 g | Freq: Once | INTRAVENOUS | Status: AC
  Administered 2020-12-12: 08:00:00 2 g via INTRAVENOUS
  Filled 2020-12-12: qty 50

## 2020-12-12 MED ORDER — ADULT MULTIVITAMIN W/MINERALS CH
1.0000 | ORAL_TABLET | Freq: Every day | ORAL | Status: DC
  Administered 2020-12-13 – 2020-12-31 (×19): 1 via ORAL
  Filled 2020-12-12 (×20): qty 1

## 2020-12-12 NOTE — Plan of Care (Signed)
  Problem: Education: Goal: Knowledge of General Education information will improve Description: Including pain rating scale, medication(s)/side effects and non-pharmacologic comfort measures Outcome: Progressing   Problem: Health Behavior/Discharge Planning: Goal: Ability to manage health-related needs will improve Outcome: Progressing   Problem: Clinical Measurements: Goal: Ability to maintain clinical measurements within normal limits will improve Outcome: Progressing Goal: Will remain free from infection Outcome: Progressing Goal: Diagnostic test results will improve Outcome: Progressing Goal: Respiratory complications will improve Outcome: Progressing Goal: Cardiovascular complication will be avoided Outcome: Progressing   Problem: Activity: Goal: Risk for activity intolerance will decrease Outcome: Progressing   Problem: Nutrition: Goal: Adequate nutrition will be maintained Outcome: Progressing   Problem: Coping: Goal: Level of anxiety will decrease Outcome: Progressing   Problem: Elimination: Goal: Will not experience complications related to bowel motility Outcome: Progressing Goal: Will not experience complications related to urinary retention Outcome: Progressing   Problem: Pain Managment: Goal: General experience of comfort will improve Outcome: Progressing   Problem: Safety: Goal: Ability to remain free from injury will improve Outcome: Progressing   Problem: Skin Integrity: Goal: Risk for impaired skin integrity will decrease Outcome: Progressing   Problem: Education: Goal: Expressions of having a comfortable level of knowledge regarding the disease process will increase Outcome: Progressing   Problem: Coping: Goal: Ability to adjust to condition or change in health will improve Outcome: Progressing Goal: Ability to identify appropriate support needs will improve Outcome: Progressing   Problem: Health Behavior/Discharge Planning: Goal:  Compliance with prescribed medication regimen will improve Outcome: Progressing   Problem: Medication: Goal: Risk for medication side effects will decrease Outcome: Progressing   Problem: Clinical Measurements: Goal: Complications related to the disease process, condition or treatment will be avoided or minimized Outcome: Progressing Goal: Diagnostic test results will improve Outcome: Progressing   Problem: Safety: Goal: Verbalization of understanding the information provided will improve Outcome: Progressing   Problem: Self-Concept: Goal: Level of anxiety will decrease Outcome: Progressing Goal: Ability to verbalize feelings about condition will improve Outcome: Progressing   Problem: Education: Goal: Knowledge of disease or condition will improve Outcome: Progressing Goal: Knowledge of secondary prevention will improve Outcome: Progressing Goal: Knowledge of patient specific risk factors addressed and post discharge goals established will improve Outcome: Progressing Goal: Individualized Educational Video(s) Outcome: Progressing   Problem: Coping: Goal: Will verbalize positive feelings about self Outcome: Progressing Goal: Will identify appropriate support needs Outcome: Progressing   Problem: Health Behavior/Discharge Planning: Goal: Ability to manage health-related needs will improve Outcome: Progressing   Problem: Self-Care: Goal: Ability to participate in self-care as condition permits will improve Outcome: Progressing Goal: Verbalization of feelings and concerns over difficulty with self-care will improve Outcome: Progressing Goal: Ability to communicate needs accurately will improve Outcome: Progressing   Problem: Nutrition: Goal: Risk of aspiration will decrease Outcome: Progressing Goal: Dietary intake will improve Outcome: Progressing   

## 2020-12-12 NOTE — Progress Notes (Signed)
Initial Nutrition Assessment  DOCUMENTATION CODES:   Underweight  INTERVENTION:   Monitor magnesium, potassium, and phosphorus daily for at least 3 days, MD to replete as needed, as pt is at risk for refeeding syndrome.   Boost Breeze po TID, each supplement provides 250 kcal and 9 grams of protein  91ml Prosource Plus po BID, each supplement provides 100 kcals and 15 grams of protein  MVI with minerals daily  Staff to assist pt with meals/supplements as needed   If pt's diet is unable to be advanced, recommend discussion regarding GOC -- determine whether decisions will be made based on pt's living will which states no artificial nutrition vs pursuing nutrition support.   NUTRITION DIAGNOSIS:   Inadequate oral intake related to poor appetite as evidenced by meal completion < 50%.    GOAL:   Patient will meet greater than or equal to 90% of their needs    MONITOR:   PO intake,Supplement acceptance,Diet advancement,Weight trends,Labs,I & O's  REASON FOR ASSESSMENT:   Consult Enteral/tube feeding initiation and management  ASSESSMENT:   Pt admitted with breakthrough seizures. PMH includes prostate cancer, depression, type 2 DM, HTN, HLD, seizure, substance abuse. Pt bedbound at baseline.  Pt has had poor po intake since admission. RD was consulted to initiate/manage TF; however, most recent MD note today states that pt is alert and able to eat/drink with assistance of nursing staff, so plan to hold off on feeding tube for now. Note that pt's living will documents that he would not want artificial means of nutrition.   PO Intake: 0-100% x 4 recorded meals (27.5% average meal intake). Note pt is on a clear liquid diet which is very limited in calories and provides almost no protein. Will order oral nutrition supplements to provide kcals/protein; however, this will still not be adequate to meet pt's needs. If pt's diet is unable to be advanced, recommend discussion  regarding GOC -- determine whether decisions will be made based on pt's living will which states no artificial nutrition vs pursuing nutrition support.    UOP: 1423ml x24 hours  Labs: Na 134 (L), K+ 3.1 (L), PO4 2.4 (L), Mg 1.6 (L) Medications: IV potassium phosphate 19mmol in D5  NUTRITION - FOCUSED PHYSICAL EXAM:  Unable to perform at this time; will attempt at follow-up.   Diet Order:   Diet Order            Diet clear liquid Room service appropriate? Yes; Fluid consistency: Thin  Diet effective now                 EDUCATION NEEDS:   No education needs have been identified at this time  Skin:  Skin Assessment: Skin Integrity Issues: Skin Integrity Issues:: Stage II Stage II: coccyx  Last BM:  12/25 type 7  Height:   Ht Readings from Last 1 Encounters:  12/04/20 6\' 1"  (1.854 m)    Weight:   Wt Readings from Last 1 Encounters:  12/04/20 58.3 kg    BMI:  Body mass index is 16.96 kg/m.  Estimated Nutritional Needs:   Kcal:  1950-2150  Protein:  95-105 grams  Fluid:  >1.95L/d    Larkin Ina, MS, RD, LDN RD pager number and weekend/on-call pager number located in [place].

## 2020-12-12 NOTE — Progress Notes (Signed)
PROGRESS NOTE    Brad [name]  [id number] DOB: 01-11-1946 DOA: 12/04/2020 PCP: [name], DO   Brief Narrative:  Patient is 74 year old male with past medical history of prostate cancer, depression, type 2 diabetes mellitus, hypertension, hyperlipidemia, seizure, substance abuse presents to emergency department with altered mental status.  Upon arrival to ED: Patient's blood pressure was noted to be systolic [id number], remainder vital signs were stable.  WBC: 9.9, sodium 138, potassium 4.4, blood glucose: 75, valproic acid subtherapeutic at 13, COVID-19 and influenza Pallin: Negative.  Head CT negative for acute intracranial abnormality.  CTA head and neck negative for LVO or high-grade stenosis of intracranial arteries.  Unchanged 60% stenosis of proximal left internal carotid artery secondary to mixed density atherosclerosis.  Normal CT perfusion.  Patient was seen by neurology and after speaking to his family patient presentation was felt to be due to seizure-like activity at home.  Patient admitted for further evaluation and management.  Patient was given IV valproate to 50 mg twice daily.  Record patient is bedbound since 2018.  Due to patient's poor p.o. intake palliative care was consulted.  Patient's daughter chosen to make her father DNR.  It has also been noted that the patient's living will documents that he would not want artificial means of feeding him.  Assessment & Plan:   Breakthrough seizures: -Likely secondary to medication noncompliance.  Has a history of underlying dementia. -CT head and CTA head and neck: Negative for acute findings. -Valproic acid level: Subtherapeutic on admission. -Patient was given loading dose of IV valproate 600 mg in the ED.    -Continue IV Ativan 2 mg as needed for seizures like activity. -EEG is positive for seizures on admission. -Consulted neurology on 12/24: Recommended increase Depacon to 500 mg twice daily from 250 mg 3 times  daily -Valproic acid level: Therapeutic.  Repeat EEG on 12/25: Negative for seizures. -On seizure precautions -Consult PT/OT recommend SNF if able to participate, if not then he needs LTAC.  Dysphagia: -12/26: Patient is alert and sitting comfortably on the bed and able to eat/drink with the help of the nursing staff. -We will hold off tube feedings at this time.  Hypertension: -Blood pressure is stable.  Continue IV hydralazine as needed.  -Will resume PO meds tomorrow if he continues to take p.o.  History of substance abuse: UDS negative this time.  Depression/hyperlipidemia/history of prostate cancer: -Continue to hold p.o. home meds at this time  Hypokalemia: Potassium improved from 2.7-3.1.  Replenished.  Repeat BMP tomorrow a.m.  Hypomagnesemia: Magnesium improved from 1.4-1.6 this morning.  Replenished.  Repeat magnesium level tomorrow a.m.  Hypophosphatemia: Phosphorus 2.4.  Replenished.  Repeat phosphorus level tomorrow AM  Normocytic anemia: -Baseline hemoglobin between 10-11.  Hemoglobin 9.3 -Continue to monitor H&H closely and transfuse as needed.  Thrombocytopenia: Platelet 130-131-139.  No active bleeding -Repeat CBC tomorrow AM  Severe protein calorie malnutrition: BMI of 16 -Consulted dietitian  Goals of care: Palliative is on board-appreciate assistance.  Patient is DNR/DNI.   DVT prophylaxis: Lovenox Code Status: DNR Family Communication:  None present at bedside.  Plan of care discussed with patient in length and he verbalized understanding and agreed with it.  Disposition Plan: SNF  Consultants:   Neurology  Procedures:  CT head EEG Antimicrobials:   None  Status is: Inpatient   Dispo: The patient is from: Home              Anticipated d/c is to: SNF  Anticipated d/c date is: More than 3 days              Patient currently is not medically stable to d/c.   Subjective: Patient seen and examined.  Fortunately this morning he  is alert, sitting comfortably and having sips of juice, broth and Jell-O with the help of nursing staff at the bedside.  When I said I am glad to see that you are improving.  He said " thanks".  Vitals:   12/11/20 2346 12/12/20 0539 12/12/20 0730 12/12/20 1200  BP: 136/89 (!) 143/74 (!) 151/78 (!) 128/56  Pulse: 65 65 74 (!) 58  Resp: 18 18 14 16   Temp: 98.4 F (36.9 C) 98.4 F (36.9 C) 98.3 F (36.8 C) 98.1 F (36.7 C)  TempSrc: Oral Oral Oral Oral  SpO2: 100% 100% 94% 100%  Weight:      Height:        Intake/Output Summary (Last 24 hours) at 12/12/2020 1304 Last data filed at 12/12/2020 0956 Gross per 24 hour  Intake 2893.49 ml  Output 1450 ml  Net 1443.49 ml   Filed Weights   12/04/20 2310 12/04/20 2312  Weight: 58.3 kg 58.3 kg    Examination:  General exam: Appears calm and comfortable, alert and having breakfast with the help of nursing staff at the bedside. respiratory system: Clear to auscultation. Respiratory effort normal. Cardiovascular system: S1 & S2 heard, RRR. No JVD, murmurs, rubs, gallops or clicks. No pedal edema. Gastrointestinal system: Abdomen is nondistended, soft and nontender. No organomegaly or masses felt. Normal bowel sounds heard. Central nervous system: Alert, following commands. skin: No rashes, lesions or ulcers    Data Reviewed: I have personally reviewed following labs and imaging studies  CBC: Recent Labs  Lab 12/06/20 0304 12/08/20 0910 12/11/20 0319  WBC 11.9* 6.2 5.7  NEUTROABS 9.4* 3.7  --   HGB 10.7* 9.3* 9.5*  HCT 33.2* 28.3* 28.5*  MCV 94.6 95.3 91.9  PLT 182 130* [id number]*   Basic Metabolic Panel: Recent Labs  Lab 12/06/20 0304 12/08/20 0910 12/08/20 1625 12/11/20 0319 12/11/20 1354 12/12/20 0244  NA 139 139  --  132* 132* 134*  K 3.7 2.9*  --  2.7* 2.9* 3.1*  CL 100 102  --  94* 93* 96*  CO2 28 28  --  28 26 28   GLUCOSE 128* 93  --  86 93 94  BUN 15 6*  --  <5* <5* <5*  CREATININE 0.94 0.77  --  0.72 0.71 0.63   CALCIUM 8.9 8.2*  --  7.9* 7.9* 8.2*  MG  --   --  1.2* 1.4*  --  1.6*  PHOS  --   --   --   --   --  2.4*   GFR: Estimated Creatinine Clearance: 67.8 mL/min (by C-G formula based on SCr of 0.63 mg/dL). Liver Function Tests: No results for input(s): AST, ALT, ALKPHOS, BILITOT, PROT, ALBUMIN in the last 168 hours. No results for input(s): LIPASE, AMYLASE in the last 168 hours. Recent Labs  Lab 12/06/20 0304  AMMONIA 32   Coagulation Profile: No results for input(s): INR, PROTIME in the last 168 hours. Cardiac Enzymes: No results for input(s): CKTOTAL, CKMB, CKMBINDEX, TROPONINI in the last 168 hours. BNP (last 3 results) No results for input(s): PROBNP in the last 8760 hours. HbA1C: No results for input(s): HGBA1C in the last 72 hours. CBG: Recent Labs  Lab 12/12/20 1235  GLUCAP 128*   Lipid Profile:  No results for input(s): CHOL, HDL, LDLCALC, TRIG, CHOLHDL, LDLDIRECT in the last 72 hours. Thyroid Function Tests: No results for input(s): TSH, T4TOTAL, FREET4, T3FREE, THYROIDAB in the last 72 hours. Anemia Panel: No results for input(s): VITAMINB12, FOLATE, FERRITIN, TIBC, IRON, RETICCTPCT in the last 72 hours. Sepsis Labs: No results for input(s): PROCALCITON, LATICACIDVEN in the last 168 hours.  Recent Results (from the past 240 hour(s))  Resp Panel by RT-PCR (Flu A&B, Covid) Nasopharyngeal Swab     Status: None   Collection Time: 12/05/20 12:18 AM   Specimen: Nasopharyngeal Swab; Nasopharyngeal(NP) swabs in vial transport medium  Result Value Ref Range Status   SARS Coronavirus 2 by RT PCR NEGATIVE NEGATIVE Final    Comment: (NOTE) SARS-CoV-2 target nucleic acids are NOT DETECTED.  The SARS-CoV-2 RNA is generally detectable in upper respiratory specimens during the acute phase of infection. The lowest concentration of SARS-CoV-2 viral copies this assay can detect is 138 copies/mL. A negative result does not preclude SARS-Cov-2 infection and should not be used as  the sole basis for treatment or other patient management decisions. A negative result may occur with  improper specimen collection/handling, submission of specimen other than nasopharyngeal swab, presence of viral mutation(s) within the areas targeted by this assay, and inadequate number of viral copies(<138 copies/mL). A negative result must be combined with clinical observations, patient history, and epidemiological information. The expected result is Negative.  Fact Sheet for Patients:  EntrepreneurPulse.com.au  Fact Sheet for Healthcare Providers:  IncredibleEmployment.be  This test is no t yet approved or cleared by the [place] FDA and  has been authorized for detection and/or diagnosis of SARS-CoV-2 by FDA under an Emergency Use Authorization (EUA). This EUA will remain  in effect (meaning this test can be used) for the duration of the COVID-19 declaration under Section 564(b)(1) of the Act, 21 U.S.C.section 360bbb-3(b)(1), unless the authorization is terminated  or revoked sooner.       Influenza A by PCR NEGATIVE NEGATIVE Final   Influenza B by PCR NEGATIVE NEGATIVE Final    Comment: (NOTE) The Xpert Xpress SARS-CoV-2/FLU/RSV plus assay is intended as an aid in the diagnosis of influenza from Nasopharyngeal swab specimens and should not be used as a sole basis for treatment. Nasal washings and aspirates are unacceptable for Xpert Xpress SARS-CoV-2/FLU/RSV testing.  Fact Sheet for Patients: EntrepreneurPulse.com.au  Fact Sheet for Healthcare Providers: IncredibleEmployment.be  This test is not yet approved or cleared by the [place] FDA and has been authorized for detection and/or diagnosis of SARS-CoV-2 by FDA under an Emergency Use Authorization (EUA). This EUA will remain in effect (meaning this test can be used) for the duration of the COVID-19 declaration under Section 564(b)(1) of  the Act, 21 U.S.C. section 360bbb-3(b)(1), unless the authorization is terminated or revoked.  Performed at [facility], [place] [address] [place], [place] [address]       Radiology Studies: EEG  Result Date: 12/11/2020 Lora Havens, MD     12/11/2020  1:24 PM Patient Name: Brad [name] MRN: [id number] Epilepsy Attending: Lora Havens Referring Physician/Provider: Dr Early Osmond Date: 12/11/2020 Duration: 23.53 mins Patient history: 74yo M with dementia and seizures. EEG to evaluate for seizure Level of alertness: Awake AEDs during EEG study: VPA Technical aspects: This EEG study was done with scalp electrodes positioned according to the 10-20 International system of electrode placement. Electrical activity was acquired at a sampling rate of 500Hz  and reviewed with a high frequency  filter of 70Hz  and a low frequency filter of 1Hz . EEG data were recorded continuously and digitally stored. Description: The posterior dominant rhythm consists of 8 Hz activity of moderate voltage (25-35 uV) seen predominantly in posterior head regions, symmetric and reactive to eye opening and eye closing. EEG showed intermittent generalized 3 to 6 Hz theta-delta slowing.Spike was seen in left temporoparietal region.   Hyperventilation and photic stimulation were not performed.   ABNORMALITY -Intermittent slow, generalized -Spike, left temporoparietal region. IMPRESSION: This study showed evidence of epileptogenicity arising left temporoparietal region as well as mild diffuse encephalopathy, nonspecific etiology. No seizures were seen throughout the recording. Priyanka Barbra Sarks    Scheduled Meds: . enoxaparin (LOVENOX) injection  40 mg Subcutaneous Daily  . lubriderm seriously sensitive   Topical Daily   Continuous Infusions: . dextrose 5% lactated ringers 100 mL/hr at 12/11/20 2147  . potassium chloride 10 mEq (12/12/20 1301)  . potassium PHOSPHATE IVPB (in mmol)    . valproate sodium 500 mg  (12/12/20 1044)     LOS: 7 days   Time spent: 30 minutes  Leydy Worthey Loann Quill, MD [facility]  If 7PM-7AM, please contact night-coverage [email or web address] 12/12/2020, 1:04 PM

## 2020-12-13 LAB — COMPREHENSIVE METABOLIC PANEL
ALT: 11 U/L (ref 0–44)
AST: 18 U/L (ref 15–41)
Albumin: 2.1 g/dL — ABNORMAL LOW (ref 3.5–5.0)
Alkaline Phosphatase: 41 U/L (ref 38–126)
Anion gap: 11 (ref 5–15)
BUN: <5 mg/dL — ABNORMAL LOW (ref 8–23)
CO2: 29 mmol/L (ref 22–32)
Calcium: 8 mg/dL — ABNORMAL LOW (ref 8.9–10.3)
Chloride: 96 mmol/L — ABNORMAL LOW (ref 98–111)
Creatinine, Ser: 0.66 mg/dL (ref 0.61–1.24)
GFR, Estimated: >60 mL/min (ref >60)
Glucose, Bld: 77 mg/dL (ref 70–99)
Potassium: 3.5 mmol/L (ref 3.5–5.1)
Sodium: 136 mmol/L (ref 135–145)
Total Bilirubin: 0.5 mg/dL (ref 0.3–1.2)
Total Protein: 5 g/dL — ABNORMAL LOW (ref 6.5–8.1)

## 2020-12-13 LAB — MAGNESIUM: Magnesium: 1.6 mg/dL — ABNORMAL LOW (ref 1.7–2.4)

## 2020-12-13 LAB — PHOSPHORUS: Phosphorus: 2.9 mg/dL (ref 2.5–4.6)

## 2020-12-13 MED ORDER — MAGNESIUM SULFATE 2 GM/50ML IV SOLN
2.0000 g | Freq: Once | INTRAVENOUS | Status: AC
  Administered 2020-12-13: 09:00:00 2 g via INTRAVENOUS
  Filled 2020-12-13: qty 50

## 2020-12-13 MED ORDER — DIVALPROEX SODIUM 250 MG PO DR TAB
500.0000 mg | DELAYED_RELEASE_TABLET | Freq: Two times a day (BID) | ORAL | Status: DC
  Administered 2020-12-13: 22:00:00 500 mg via ORAL
  Filled 2020-12-13 (×2): qty 2

## 2020-12-13 MED ORDER — TAMSULOSIN HCL 0.4 MG PO CAPS
0.4000 mg | ORAL_CAPSULE | Freq: Every day | ORAL | Status: DC
  Administered 2020-12-13 – 2020-12-29 (×17): 0.4 mg via ORAL
  Filled 2020-12-13 (×19): qty 1

## 2020-12-13 MED ORDER — ATORVASTATIN CALCIUM 10 MG PO TABS
20.0000 mg | ORAL_TABLET | Freq: Every day | ORAL | Status: DC
  Administered 2020-12-13 – 2020-12-30 (×18): 20 mg via ORAL
  Filled 2020-12-13 (×18): qty 2

## 2020-12-13 MED ORDER — FLUOXETINE HCL 20 MG PO CAPS
40.0000 mg | ORAL_CAPSULE | Freq: Every day | ORAL | Status: DC
  Administered 2020-12-13 – 2020-12-31 (×19): 40 mg via ORAL
  Filled 2020-12-13 (×20): qty 2

## 2020-12-13 MED ORDER — AMLODIPINE BESYLATE 2.5 MG PO TABS
2.5000 mg | ORAL_TABLET | Freq: Every day | ORAL | Status: DC
  Administered 2020-12-13 – 2020-12-31 (×18): 2.5 mg via ORAL
  Filled 2020-12-13 (×20): qty 1

## 2020-12-13 MED ORDER — MIRTAZAPINE 15 MG PO TABS
30.0000 mg | ORAL_TABLET | Freq: Every day | ORAL | Status: DC
  Administered 2020-12-13 – 2020-12-30 (×18): 30 mg via ORAL
  Filled 2020-12-13 (×18): qty 2

## 2020-12-13 NOTE — Progress Notes (Signed)
[facility] ([facility] Hospital Liaison Note  Unfortunately, [facility] is unable to offer a room for this patient this morning.  An [facility] Hospital Liaison will follow up with the family and TOC daily or sooner if a bed becomes available.  Please call with any hospice related questions,  Thank you,  Sherry Gibson, RN [facility]LT (on AMION) [phone]  

## 2020-12-13 NOTE — Progress Notes (Signed)
PROGRESS NOTE    KASHYAP NUTTLE  [id number] DOB: [birth year] DOA: 12/04/2020 PCP: [name], DO   Brief Narrative:  Patient is 74 year old male with past medical history of prostate cancer, depression, type 2 diabetes mellitus, hypertension, hyperlipidemia, seizure, substance abuse presents to emergency department with altered mental status.  Upon arrival to ED: Patient's blood pressure was noted to be systolic [id number], remainder vital signs were stable.  WBC: 9.9, sodium 138, potassium 4.4, blood glucose: 75, valproic acid subtherapeutic at 13, COVID-19 and influenza Pallin: Negative.  Head CT negative for acute intracranial abnormality.  CTA head and neck negative for LVO or high-grade stenosis of intracranial arteries.  Unchanged 60% stenosis of proximal left internal carotid artery secondary to mixed density atherosclerosis.  Normal CT perfusion.  Patient was seen by neurology and after speaking to his family patient presentation was felt to be due to seizure-like activity at home.  Patient admitted for further evaluation and management.  Patient was given IV valproate to 50 mg twice daily.  Record patient is bedbound since 2018.  Due to patient's poor p.o. intake palliative care was consulted.  Patient's daughter chosen to make her father DNR.  It has also been noted that the patient's living will documents that he would not want artificial means of feeding him.  Assessment & Plan:   Breakthrough seizures: -Likely secondary to medication noncompliance.  Has a history of underlying dementia. -CT head and CTA head and neck: Negative for acute findings. -Valproic acid level: Subtherapeutic on admission.  EEG positive for seizures. -Patient was given loading dose of IV valproate 600 mg in the ED.    -Continue IV Ativan 2 mg as needed for seizures like activity. -Continue Depacon 500 mg twice daily-changed  IV to p.o. -Repeat valproic acid level: Therapeutic.  Repeat EEG on 12/25:  Negative for seizures.  Appreciate neurology's recommendations. -On seizure precautions -Consult PT/OT recommend SNF if able to participate, if not then he needs LTAC.  Dysphagia: -12/26: Able to take PO -Continue multivitamin, feeding supplement-Prosource twice daily, boost/resource breeze 3 times daily.  No signs of aspiration. -Appreciate dietitian's help.  Hypertension: -Blood pressure is stable.   -Resume home meds amlodipine.  Monitor blood pressure closely.  Continue IV hydralazine as needed if SBP more than 170.  Hyperlipidemia: Resume atorvastatin  Hypokalemia: Resolved.  Hypomagnesemia: Replenished.  Repeat magnesium level tomorrow AM.  Hypophosphatemia: Resolved  Normocytic anemia: -Baseline hemoglobin between 10-11.  Hemoglobin 9.3 -Continue to monitor H&H closely and transfuse as needed.  Thrombocytopenia: Platelet 130-131-139.  No active bleeding -Repeat CBC tomorrow AM  Severe protein calorie malnutrition: BMI of 16 -Albumin-2.1.  Consulted dietitian-continue multivitamin and feeding supplement as per dietitian's recommendations.  Monitor signs for refeeding syndrome -Replace electrolytes.  Potassium, phosphorus level: WNL.  Magnesium level: 1.6-replenished.  History of substance abuse: UDS negative this time. \  Goals of care: Palliative is on board-appreciate assistance.  Patient is DNR/DNI.   DVT prophylaxis: Lovenox Code Status: DNR Family Communication:  None present at bedside.  Plan of care discussed with patient in length and he verbalized understanding and agreed with it.  Disposition Plan: SNF  Consultants:   Neurology  Palliative care  Procedures:  CT head EEG Antimicrobials:   None  Status is: Inpatient   Dispo: The patient is from: Home              Anticipated d/c is to: SNF              Anticipated  d/c date is: More than 3 days              Patient currently is not medically stable to d/c.   Subjective: Patient seen and  examined.  Sitting comfortably on the bed and eating breakfast with the help of nursing staff at the bedside.  He tried to talk/communicate however it is very difficult to understand his words.  No acute events overnight.  Remained afebrile.  No seizures reported.  Vitals:   12/12/20 1953 12/12/20 2336 12/13/20 0449 12/13/20 0845  BP: 134/79 (!) 145/80 (!) 142/71 (!) 159/75  Pulse: 91 92 61 68  Resp: 18 17 18 18   Temp: 98.2 F (36.8 C) 98.5 F (36.9 C) 98.1 F (36.7 C) 98.3 F (36.8 C)  TempSrc: Oral Axillary Oral Oral  SpO2: 97% 100% 99% 99%  Weight:      Height:        Intake/Output Summary (Last 24 hours) at 12/13/2020 1031 Last data filed at 12/13/2020 0548 Gross per 24 hour  Intake 1783.99 ml  Output 1400 ml  Net 383.99 ml   Filed Weights   12/04/20 2310 12/04/20 2312  Weight: 58.3 kg 58.3 kg    Examination:  General exam: Appears calm and comfortable, alert and having breakfast with the help of nursing staff at the bedside.  On room air, thin and lean. respiratory system: Clear to auscultation. Respiratory effort normal. Cardiovascular system: S1 & S2 heard, RRR. No JVD, murmurs, rubs, gallops or clicks. No pedal edema. Gastrointestinal system: Abdomen is nondistended, soft and nontender. No organomegaly or masses felt. Normal bowel sounds heard. Central nervous system: Alert, following commands, more communicative today.. skin: No rashes, lesions or ulcers    Data Reviewed: I have personally reviewed following labs and imaging studies  CBC: Recent Labs  Lab 12/08/20 0910 12/11/20 0319  WBC 6.2 5.7  NEUTROABS 3.7  --   HGB 9.3* 9.5*  HCT 28.3* 28.5*  MCV 95.3 91.9  PLT 130* 131*   Basic Metabolic Panel: Recent Labs  Lab 12/08/20 0910 12/08/20 1625 12/11/20 0319 12/11/20 1354 12/12/20 0244 12/13/20 0220  NA 139  --  132* 132* 134* 136  K 2.9*  --  2.7* 2.9* 3.1* 3.5  CL 102  --  94* 93* 96* 96*  CO2 28  --  28 26 28 29   GLUCOSE 93  --  86 93 94  77  BUN 6*  --  <5* <5* <5* <5*  CREATININE 0.77  --  0.72 0.71 0.63 0.66  CALCIUM 8.2*  --  7.9* 7.9* 8.2* 8.0*  MG  --  1.2* 1.4*  --  1.6* 1.6*  PHOS  --   --   --   --  2.4* 2.9   GFR: Estimated Creatinine Clearance: 67.8 mL/min (by C-G formula based on SCr of 0.66 mg/dL). Liver Function Tests: Recent Labs  Lab 12/13/20 0220  AST 18  ALT 11  ALKPHOS 41  BILITOT 0.5  PROT 5.0*  ALBUMIN 2.1*   No results for input(s): LIPASE, AMYLASE in the last 168 hours. No results for input(s): AMMONIA in the last 168 hours. Coagulation Profile: No results for input(s): INR, PROTIME in the last 168 hours. Cardiac Enzymes: No results for input(s): CKTOTAL, CKMB, CKMBINDEX, TROPONINI in the last 168 hours. BNP (last 3 results) No results for input(s): PROBNP in the last 8760 hours. HbA1C: No results for input(s): HGBA1C in the last 72 hours. CBG: Recent Labs  Lab 12/12/20 1235 12/12/20  1645 12/12/20 2105  GLUCAP 128* 87 98   Lipid Profile: No results for input(s): CHOL, HDL, LDLCALC, TRIG, CHOLHDL, LDLDIRECT in the last 72 hours. Thyroid Function Tests: No results for input(s): TSH, T4TOTAL, FREET4, T3FREE, THYROIDAB in the last 72 hours. Anemia Panel: No results for input(s): VITAMINB12, FOLATE, FERRITIN, TIBC, IRON, RETICCTPCT in the last 72 hours. Sepsis Labs: No results for input(s): PROCALCITON, LATICACIDVEN in the last 168 hours.  Recent Results (from the past 240 hour(s))  Resp Panel by RT-PCR (Flu A&B, Covid) Nasopharyngeal Swab     Status: None   Collection Time: 12/05/20 12:18 AM   Specimen: Nasopharyngeal Swab; Nasopharyngeal(NP) swabs in vial transport medium  Result Value Ref Range Status   SARS Coronavirus 2 by RT PCR NEGATIVE NEGATIVE Final    Comment: (NOTE) SARS-CoV-2 target nucleic acids are NOT DETECTED.  The SARS-CoV-2 RNA is generally detectable in upper respiratory specimens during the acute phase of infection. The lowest concentration of SARS-CoV-2  viral copies this assay can detect is 138 copies/mL. A negative result does not preclude SARS-Cov-2 infection and should not be used as the sole basis for treatment or other patient management decisions. A negative result may occur with  improper specimen collection/handling, submission of specimen other than nasopharyngeal swab, presence of viral mutation(s) within the areas targeted by this assay, and inadequate number of viral copies(<138 copies/mL). A negative result must be combined with clinical observations, patient history, and epidemiological information. The expected result is Negative.  Fact Sheet for Patients:  EntrepreneurPulse.com.au  Fact Sheet for Healthcare Providers:  IncredibleEmployment.be  This test is no t yet approved or cleared by the [place] FDA and  has been authorized for detection and/or diagnosis of SARS-CoV-2 by FDA under an Emergency Use Authorization (EUA). This EUA will remain  in effect (meaning this test can be used) for the duration of the COVID-19 declaration under Section 564(b)(1) of the Act, 21 U.S.C.section 360bbb-3(b)(1), unless the authorization is terminated  or revoked sooner.       Influenza A by PCR NEGATIVE NEGATIVE Final   Influenza B by PCR NEGATIVE NEGATIVE Final    Comment: (NOTE) The Xpert Xpress SARS-CoV-2/FLU/RSV plus assay is intended as an aid in the diagnosis of influenza from Nasopharyngeal swab specimens and should not be used as a sole basis for treatment. Nasal washings and aspirates are unacceptable for Xpert Xpress SARS-CoV-2/FLU/RSV testing.  Fact Sheet for Patients: EntrepreneurPulse.com.au  Fact Sheet for Healthcare Providers: IncredibleEmployment.be  This test is not yet approved or cleared by the [place] FDA and has been authorized for detection and/or diagnosis of SARS-CoV-2 by FDA under an Emergency Use Authorization  (EUA). This EUA will remain in effect (meaning this test can be used) for the duration of the COVID-19 declaration under Section 564(b)(1) of the Act, 21 U.S.C. section 360bbb-3(b)(1), unless the authorization is terminated or revoked.  Performed at [facility], [place] [address], [place], [place] [address]       Radiology Studies: EEG  Result Date: 12/11/2020 Lora Havens, MD     12/11/2020  1:24 PM Patient Name: KYLLIAN ERRANTE MRN: [id number] Epilepsy Attending: Lora Havens Referring Physician/Provider: Dr Early Osmond Date: 12/11/2020 Duration: 23.53 mins Patient history: 74yo M with dementia and seizures. EEG to evaluate for seizure Level of alertness: Awake AEDs during EEG study: VPA Technical aspects: This EEG study was done with scalp electrodes positioned according to the 10-20 International system of electrode placement. Electrical activity was acquired  at a sampling rate of 500Hz  and reviewed with a high frequency filter of 70Hz  and a low frequency filter of 1Hz . EEG data were recorded continuously and digitally stored. Description: The posterior dominant rhythm consists of 8 Hz activity of moderate voltage (25-35 uV) seen predominantly in posterior head regions, symmetric and reactive to eye opening and eye closing. EEG showed intermittent generalized 3 to 6 Hz theta-delta slowing.Spike was seen in left temporoparietal region.   Hyperventilation and photic stimulation were not performed.   ABNORMALITY -Intermittent slow, generalized -Spike, left temporoparietal region. IMPRESSION: This study showed evidence of epileptogenicity arising left temporoparietal region as well as mild diffuse encephalopathy, nonspecific etiology. No seizures were seen throughout the recording. Priyanka Barbra Sarks    Scheduled Meds: . (feeding supplement) PROSource Plus  30 mL Oral BID BM  . enoxaparin (LOVENOX) injection  40 mg Subcutaneous Daily  . feeding supplement  1 Container Oral TID BM   . lubriderm seriously sensitive   Topical Daily  . multivitamin with minerals  1 tablet Oral Daily   Continuous Infusions: . dextrose 5% lactated ringers 100 mL/hr at 12/13/20 0549  . valproate sodium 500 mg (12/12/20 2135)     LOS: 8 days   Time spent: 30 minutes  Baltazar Pekala Loann Quill, MD [facility]  If 7PM-7AM, please contact night-coverage [email or web address] 12/13/2020, 10:31 AM

## 2020-12-13 NOTE — Progress Notes (Signed)
PHARMACY IV TO PO CONVERSION PROTOCOL:  As per policy approved by the Pharmacy & Therapeutics and Medical Executive Committees, the valproic acid dosage will be adjusted accordingly.  Current dosage: valproate 500mg  IV q12 hours  Changed to: Depakote DR tablets 500mg  PO q12 hours  Conversion from IV to DR tablets is same total daily dose divided into 2 doses.  Dimple Nanas, PharmD PGY-1 Acute Care Pharmacy Resident Office: [phone] 12/13/2020 10:45 AM

## 2020-12-13 NOTE — Plan of Care (Signed)
  Problem: Education: Goal: Knowledge of General Education information will improve Description: Including pain rating scale, medication(s)/side effects and non-pharmacologic comfort measures Outcome: Progressing   Problem: Health Behavior/Discharge Planning: Goal: Ability to manage health-related needs will improve Outcome: Progressing   Problem: Clinical Measurements: Goal: Ability to maintain clinical measurements within normal limits will improve Outcome: Progressing Goal: Will remain free from infection Outcome: Progressing Goal: Diagnostic test results will improve Outcome: Progressing Goal: Respiratory complications will improve Outcome: Progressing Goal: Cardiovascular complication will be avoided Outcome: Progressing   Problem: Activity: Goal: Risk for activity intolerance will decrease Outcome: Progressing   Problem: Nutrition: Goal: Adequate nutrition will be maintained Outcome: Progressing   Problem: Coping: Goal: Level of anxiety will decrease Outcome: Progressing   Problem: Elimination: Goal: Will not experience complications related to bowel motility Outcome: Progressing Goal: Will not experience complications related to urinary retention Outcome: Progressing   Problem: Pain Managment: Goal: General experience of comfort will improve Outcome: Progressing   Problem: Safety: Goal: Ability to remain free from injury will improve Outcome: Progressing   Problem: Skin Integrity: Goal: Risk for impaired skin integrity will decrease Outcome: Progressing   Problem: Education: Goal: Expressions of having a comfortable level of knowledge regarding the disease process will increase Outcome: Progressing   Problem: Coping: Goal: Ability to adjust to condition or change in health will improve Outcome: Progressing Goal: Ability to identify appropriate support needs will improve Outcome: Progressing   Problem: Health Behavior/Discharge Planning: Goal:  Compliance with prescribed medication regimen will improve Outcome: Progressing   Problem: Medication: Goal: Risk for medication side effects will decrease Outcome: Progressing   Problem: Clinical Measurements: Goal: Complications related to the disease process, condition or treatment will be avoided or minimized Outcome: Progressing Goal: Diagnostic test results will improve Outcome: Progressing   Problem: Safety: Goal: Verbalization of understanding the information provided will improve Outcome: Progressing   Problem: Self-Concept: Goal: Level of anxiety will decrease Outcome: Progressing Goal: Ability to verbalize feelings about condition will improve Outcome: Progressing   Problem: Education: Goal: Knowledge of disease or condition will improve Outcome: Not Applicable Goal: Knowledge of secondary prevention will improve Outcome: Not Applicable Goal: Knowledge of patient specific risk factors addressed and post discharge goals established will improve Outcome: Not Applicable Goal: Individualized Educational Video(s) Outcome: Not Applicable   Problem: Coping: Goal: Will verbalize positive feelings about self Outcome: Not Applicable Goal: Will identify appropriate support needs Outcome: Not Applicable   Problem: Health Behavior/Discharge Planning: Goal: Ability to manage health-related needs will improve Outcome: Not Applicable   Problem: Self-Care: Goal: Ability to participate in self-care as condition permits will improve Outcome: Not Applicable Goal: Verbalization of feelings and concerns over difficulty with self-care will improve Outcome: Not Applicable Goal: Ability to communicate needs accurately will improve Outcome: Not Applicable   Problem: Nutrition: Goal: Risk of aspiration will decrease Outcome: Not Applicable Goal: Dietary intake will improve Outcome: Not Applicable

## 2020-12-13 NOTE — Plan of Care (Signed)
  Problem: Clinical Measurements: Goal: Ability to maintain clinical measurements within normal limits will improve Outcome: Progressing Goal: Will remain free from infection Outcome: Progressing Goal: Diagnostic test results will improve Outcome: Progressing Goal: Respiratory complications will improve Outcome: Progressing Goal: Cardiovascular complication will be avoided Outcome: Progressing   Problem: Coping: Goal: Level of anxiety will decrease Outcome: Progressing   Problem: Elimination: Goal: Will not experience complications related to bowel motility Outcome: Progressing Goal: Will not experience complications related to urinary retention Outcome: Progressing   Problem: Pain Managment: Goal: General experience of comfort will improve Outcome: Progressing   Problem: Safety: Goal: Ability to remain free from injury will improve Outcome: Progressing

## 2020-12-14 LAB — COMPREHENSIVE METABOLIC PANEL
ALT: 11 U/L (ref 0–44)
AST: 17 U/L (ref 15–41)
Albumin: 2.2 g/dL — ABNORMAL LOW (ref 3.5–5.0)
Alkaline Phosphatase: 41 U/L (ref 38–126)
Anion gap: 11 (ref 5–15)
BUN: <5 mg/dL — ABNORMAL LOW (ref 8–23)
CO2: 30 mmol/L (ref 22–32)
Calcium: 8 mg/dL — ABNORMAL LOW (ref 8.9–10.3)
Chloride: 97 mmol/L — ABNORMAL LOW (ref 98–111)
Creatinine, Ser: 0.76 mg/dL (ref 0.61–1.24)
GFR, Estimated: >60 mL/min (ref >60)
Glucose, Bld: 105 mg/dL — ABNORMAL HIGH (ref 70–99)
Potassium: 2.9 mmol/L — ABNORMAL LOW (ref 3.5–5.1)
Sodium: 138 mmol/L (ref 135–145)
Total Bilirubin: 0.5 mg/dL (ref 0.3–1.2)
Total Protein: 5.4 g/dL — ABNORMAL LOW (ref 6.5–8.1)

## 2020-12-14 LAB — PHOSPHORUS: Phosphorus: 3 mg/dL (ref 2.5–4.6)

## 2020-12-14 LAB — MAGNESIUM: Magnesium: 1.5 mg/dL — ABNORMAL LOW (ref 1.7–2.4)

## 2020-12-14 MED ORDER — DIVALPROEX SODIUM 125 MG PO CSDR
500.0000 mg | DELAYED_RELEASE_CAPSULE | Freq: Two times a day (BID) | ORAL | Status: DC
  Administered 2020-12-14 – 2020-12-31 (×34): 500 mg via ORAL
  Filled 2020-12-14 (×36): qty 4

## 2020-12-14 MED ORDER — MAGNESIUM SULFATE 2 GM/50ML IV SOLN
2.0000 g | Freq: Once | INTRAVENOUS | Status: AC
  Administered 2020-12-14: 08:00:00 2 g via INTRAVENOUS
  Filled 2020-12-14: qty 50

## 2020-12-14 MED ORDER — ENSURE ENLIVE PO LIQD
237.0000 mL | Freq: Three times a day (TID) | ORAL | Status: DC
  Administered 2020-12-14 – 2020-12-20 (×16): 237 mL via ORAL

## 2020-12-14 MED ORDER — POTASSIUM CHLORIDE 20 MEQ PO PACK
40.0000 meq | PACK | Freq: Two times a day (BID) | ORAL | Status: DC
  Administered 2020-12-14 – 2020-12-15 (×4): 40 meq via ORAL
  Filled 2020-12-14 (×4): qty 2

## 2020-12-14 NOTE — Plan of Care (Signed)

## 2020-12-14 NOTE — Plan of Care (Signed)
  Problem: Medication: Goal: Risk for medication side effects will decrease Outcome: Progressing   Problem: Clinical Measurements: Goal: Diagnostic test results will improve Outcome: Progressing   Problem: Self-Concept: Goal: Level of anxiety will decrease Outcome: Progressing

## 2020-12-14 NOTE — Progress Notes (Signed)
OT Discharge  Patient Details Name: Brad [name] MRN: [id number] DOB: 19-Jun-1946   Cancelled Treatment: Patient awaiting hospice placement and remains total care for all ADLs grossly at this time. OT to sign off. Please reconsult if needed.  Kallie Edward OTR/L Supplemental OT, Department of rehab services [phone]  Sanita Estrada R H. 12/14/2020, 10:48 AM

## 2020-12-14 NOTE — Progress Notes (Signed)
PROGRESS NOTE    Brad [name]  [id number] DOB: [birth year] DOA: 12/04/2020 PCP: [name], DO   Brief Narrative:  Patient is 74 year old male with past medical history of prostate cancer, depression, type 2 diabetes mellitus, hypertension, hyperlipidemia, seizure, substance abuse presents to emergency department with altered mental status.  Upon arrival to ED: Patient's blood pressure was noted to be systolic [id number], remainder vital signs were stable.  WBC: 9.9, sodium 138, potassium 4.4, blood glucose: 75, valproic acid subtherapeutic at 13, COVID-19 and influenza Pallin: Negative.  Head CT negative for acute intracranial abnormality.  CTA head and neck negative for LVO or high-grade stenosis of intracranial arteries.  Unchanged 60% stenosis of proximal left internal carotid artery secondary to mixed density atherosclerosis.  Normal CT perfusion.  Patient was seen by neurology and after speaking to his family patient presentation was felt to be due to seizure-like activity at home.  Patient admitted for further evaluation and management.  Patient was given IV valproate to 50 mg twice daily.  Record patient is bedbound since 2018.  Due to patient's poor p.o. intake palliative care was consulted.  Patient's daughter chosen to make her father DNR.  It has also been noted that the patient's living will documents that he would not want artificial means of feeding him.  Assessment & Plan:   Breakthrough seizures: -Likely secondary to medication noncompliance.  Has a history of underlying advanced dementia. -CT head and CTA head and neck: Negative for acute findings. -Valproic acid level: Subtherapeutic on admission.  EEG positive for seizures. -Patient was given loading dose of IV valproate 600 mg in the ED.    -Continue IV Ativan 2 mg as needed for seizures like activity. -Continue Depacon 500 mg twice daily-changed  IV to p.o. -Repeat valproic acid level: Therapeutic.  Repeat EEG on  12/25: Negative for seizures.  Appreciate neurology's recommendations. -On seizure precautions -Consult PT/OT recommend SNF if able to participate, if not then he needs LTAC.  Poor p.o. intake: -12/26: Start taking PO however he has inadequate oral intake related to poor appetite. -Continue multivitamin, feeding supplement-Prosource twice daily, Ensure Enlive p.o. 3 times daily, Magic cup 3 times daily with meals.  -Continue to assist patient with meals -Appreciate dietitian's help.  Hypertension: -Blood pressure is stable.   -Continue amlodipine.  Monitor blood pressure closely.  Continue IV hydralazine as needed if SBP more than 170.  Hyperlipidemia: Continue atorvastatin  Hypokalemia: Potassium: 2.9.  Replenished.  Repeat BMP tomorrow a.m.  Hypomagnesemia: Replenished.  Repeat magnesium level tomorrow AM.  Hypophosphatemia: Resolved  Normocytic anemia: -Baseline hemoglobin between 10-11.  Hemoglobin 9.3 -Continue to monitor H&H closely and transfuse as needed.  Thrombocytopenia: Platelet 130-131-139.  No active bleeding  Severe protein calorie malnutrition: BMI of 16 -Albumin-2.1.  Consulted dietitian-continue multivitamin and feeding supplement as per dietitian's recommendations.  Monitor signs for refeeding syndrome -Replace electrolytes.    History of substance abuse: UDS negative this time.   Goals of care: Patient is DNR/DNI as per his living well.  It has also been noted in patient's living will documents that he would not want artificial means of feeding. Palliative was on board-initially patient sister (who is also HCPOA) was inclined towards comfort care however she changed her mind and became very rude with palliative NP & myself & wanted to start tube feedings which was against patient's living will.   Neurology was consulted again & we agreed with short term tube feedings (10-14 days) however patient improved and started  taking p.o. following day. Tube  placement/feedings never started.  Palliative care is not following patient anymore.  Hospice consult was discontinued.  DVT prophylaxis: Lovenox Code Status: DNR Family Communication:  None present at bedside.  Plan of care discussed with patient in length and he verbalized understanding and agreed with it.  Disposition Plan: SNF  Consultants:   Neurology  Palliative care  Procedures:  CT head EEG Antimicrobials:   None  Status is: Inpatient   Dispo: The patient is from: Home              Anticipated d/c is to: SNF              Anticipated d/c date is: More than 3 days              Patient currently is not medically stable to d/c.   Subjective: Patient seen and examined this morning.  Sleepy but arousable, open his eyes and said " I am doing all right" .  No acute events overnight.  No seizure-like activity reported.  Remained afebrile.  Vitals:   12/14/20 0235 12/14/20 0445 12/14/20 0951 12/14/20 1511  BP: (!) 142/59  (!) 177/64 (!) 160/66  Pulse: 66  62 76  Resp: 16  16 18   Temp: 100 F (37.8 C) 97.8 F (36.6 C) 98.4 F (36.9 C) 98 F (36.7 C)  TempSrc: Axillary Oral Oral Oral  SpO2: 100%  100% 100%  Weight:      Height:        Intake/Output Summary (Last 24 hours) at 12/14/2020 1543 Last data filed at 12/14/2020 1500 Gross per 24 hour  Intake 2901.91 ml  Output 850 ml  Net 2051.91 ml   Filed Weights   12/04/20 2310 12/04/20 2312  Weight: 58.3 kg 58.3 kg    Examination:  General exam: Appears calm and comfortable,, on room air, elderly, thin and lean, somnolent.   Respiratory system: Clear to auscultation. Respiratory effort normal. Cardiovascular system: S1 & S2 heard, RRR. No JVD, murmurs, rubs, gallops or clicks. No pedal edema. Gastrointestinal system: Abdomen is nondistended, soft and nontender. No organomegaly or masses felt. Normal bowel sounds heard. Central nervous system: Sleepy but arousable however goes back to sleep easily. Skin: No  rashes, lesions or ulcers    Data Reviewed: I have personally reviewed following labs and imaging studies  CBC: Recent Labs  Lab 12/08/20 0910 12/11/20 0319  WBC 6.2 5.7  NEUTROABS 3.7  --   HGB 9.3* 9.5*  HCT 28.3* 28.5*  MCV 95.3 91.9  PLT 130* 131*   Basic Metabolic Panel: Recent Labs  Lab 12/08/20 1625 12/11/20 0319 12/11/20 1354 12/12/20 0244 12/13/20 0220 12/14/20 0417  NA  --  132* 132* 134* 136 138  K  --  2.7* 2.9* 3.1* 3.5 2.9*  CL  --  94* 93* 96* 96* 97*  CO2  --  28 26 28 29 30   GLUCOSE  --  86 93 94 77 105*  BUN  --  <5* <5* <5* <5* <5*  CREATININE  --  0.72 0.71 0.63 0.66 0.76  CALCIUM  --  7.9* 7.9* 8.2* 8.0* 8.0*  MG 1.2* 1.4*  --  1.6* 1.6* 1.5*  PHOS  --   --   --  2.4* 2.9 3.0   GFR: Estimated Creatinine Clearance: 67.8 mL/min (by C-G formula based on SCr of 0.76 mg/dL). Liver Function Tests: Recent Labs  Lab 12/13/20 0220 12/14/20 0417  AST 18 17  ALT  11 11  ALKPHOS 41 41  BILITOT 0.5 0.5  PROT 5.0* 5.4*  ALBUMIN 2.1* 2.2*   No results for input(s): LIPASE, AMYLASE in the last 168 hours. No results for input(s): AMMONIA in the last 168 hours. Coagulation Profile: No results for input(s): INR, PROTIME in the last 168 hours. Cardiac Enzymes: No results for input(s): CKTOTAL, CKMB, CKMBINDEX, TROPONINI in the last 168 hours. BNP (last 3 results) No results for input(s): PROBNP in the last 8760 hours. HbA1C: No results for input(s): HGBA1C in the last 72 hours. CBG: Recent Labs  Lab 12/12/20 1235 12/12/20 1645 12/12/20 2105  GLUCAP 128* 87 98   Lipid Profile: No results for input(s): CHOL, HDL, LDLCALC, TRIG, CHOLHDL, LDLDIRECT in the last 72 hours. Thyroid Function Tests: No results for input(s): TSH, T4TOTAL, FREET4, T3FREE, THYROIDAB in the last 72 hours. Anemia Panel: No results for input(s): VITAMINB12, FOLATE, FERRITIN, TIBC, IRON, RETICCTPCT in the last 72 hours. Sepsis Labs: No results for input(s): PROCALCITON,  LATICACIDVEN in the last 168 hours.  Recent Results (from the past 240 hour(s))  Resp Panel by RT-PCR (Flu A&B, Covid) Nasopharyngeal Swab     Status: None   Collection Time: 12/05/20 12:18 AM   Specimen: Nasopharyngeal Swab; Nasopharyngeal(NP) swabs in vial transport medium  Result Value Ref Range Status   SARS Coronavirus 2 by RT PCR NEGATIVE NEGATIVE Final    Comment: (NOTE) SARS-CoV-2 target nucleic acids are NOT DETECTED.  The SARS-CoV-2 RNA is generally detectable in upper respiratory specimens during the acute phase of infection. The lowest concentration of SARS-CoV-2 viral copies this assay can detect is 138 copies/mL. A negative result does not preclude SARS-Cov-2 infection and should not be used as the sole basis for treatment or other patient management decisions. A negative result may occur with  improper specimen collection/handling, submission of specimen other than nasopharyngeal swab, presence of viral mutation(s) within the areas targeted by this assay, and inadequate number of viral copies(<138 copies/mL). A negative result must be combined with clinical observations, patient history, and epidemiological information. The expected result is Negative.  Fact Sheet for Patients:  EntrepreneurPulse.com.au  Fact Sheet for Healthcare Providers:  IncredibleEmployment.be  This test is no t yet approved or cleared by the [place] FDA and  has been authorized for detection and/or diagnosis of SARS-CoV-2 by FDA under an Emergency Use Authorization (EUA). This EUA will remain  in effect (meaning this test can be used) for the duration of the COVID-19 declaration under Section 564(b)(1) of the Act, 21 U.S.C.section 360bbb-3(b)(1), unless the authorization is terminated  or revoked sooner.       Influenza A by PCR NEGATIVE NEGATIVE Final   Influenza B by PCR NEGATIVE NEGATIVE Final    Comment: (NOTE) The Xpert Xpress  SARS-CoV-2/FLU/RSV plus assay is intended as an aid in the diagnosis of influenza from Nasopharyngeal swab specimens and should not be used as a sole basis for treatment. Nasal washings and aspirates are unacceptable for Xpert Xpress SARS-CoV-2/FLU/RSV testing.  Fact Sheet for Patients: EntrepreneurPulse.com.au  Fact Sheet for Healthcare Providers: IncredibleEmployment.be  This test is not yet approved or cleared by the [place] FDA and has been authorized for detection and/or diagnosis of SARS-CoV-2 by FDA under an Emergency Use Authorization (EUA). This EUA will remain in effect (meaning this test can be used) for the duration of the COVID-19 declaration under Section 564(b)(1) of the Act, 21 U.S.C. section 360bbb-3(b)(1), unless the authorization is terminated or revoked.  Performed at [facility], [place] [address], [place], [place] [address]       Radiology Studies: No results found.  Scheduled Meds: . (feeding supplement) PROSource Plus  30 mL Oral BID BM  . amLODipine  2.5 mg Oral Daily  . atorvastatin  20 mg Oral QHS  . divalproex  500 mg Oral Q12H  . enoxaparin (LOVENOX) injection  40 mg Subcutaneous Daily  . feeding supplement  237 mL Oral TID BM  . FLUoxetine  40 mg Oral Daily  . lubriderm seriously sensitive   Topical Daily  . mirtazapine  30 mg Oral QHS  . multivitamin with minerals  1 tablet Oral Daily  . potassium chloride  40 mEq Oral BID  . tamsulosin  0.4 mg Oral QHS   Continuous Infusions: . dextrose 5% lactated ringers 100 mL/hr at 12/14/20 1500     LOS: 9 days   Time spent: 30 minutes  Jordin Dambrosio Loann Quill, MD [facility]  If 7PM-7AM, please contact night-coverage [email or web address] 12/14/2020, 3:43 PM

## 2020-12-14 NOTE — Progress Notes (Signed)
Nutrition Follow-up  DOCUMENTATION CODES:   Underweight  INTERVENTION:   D/c Boost Breeze  Magic cup TID with meals, each supplement provides 290 kcal and 9 grams of protein  Ensure Enlive po TID, each supplement provides 350 kcal and 20 grams of protein  Continue 39m Prosource Plus po BID, each supplement provides 100 kcals and 15 grams of protein  Continue MVI daily  Continue to assist pt with meals  NUTRITION DIAGNOSIS:   Inadequate oral intake related to poor appetite as evidenced by meal completion < 50%.  ongoing  GOAL:   Patient will meet greater than or equal to 90% of their needs  progressing  MONITOR:   PO intake,Supplement acceptance,Diet advancement,Weight trends,Labs,I & O's  REASON FOR ASSESSMENT:   Consult Enteral/tube feeding initiation and management  ASSESSMENT:   Pt admitted with breakthrough seizures. PMH includes prostate cancer, depression, type 2 DM, HTN, HLD, seizure, substance abuse. Pt bedbound at baseline.  SLP met with pt today; diet advanced to dysphagia 1 with thin liquids. Pt has not yet had a meal documented since having diet advanced. PO intake of clear liquid diet noted to be 0-100% x 7 recorded meals (65% average meal intake). Pt has been receiving Boost Breeze TID and Prosource Plus BID. Per RN, pt has been consuming supplements well. Now that pt's diet has advanced, will d/c Boost Breeze and order more nutritionally dense supplements.   UOP: [id number] hours  Labs: K+ 2.9 (L), Mg 1.5 (L) Medications: remeron, MVI with minerals, klor-con  Diet Order:   Diet Order            DIET - DYS 1 Room service appropriate? Yes; Fluid consistency: Thin  Diet effective now                 EDUCATION NEEDS:   No education needs have been identified at this time  Skin:  Skin Assessment: Skin Integrity Issues: Skin Integrity Issues:: Stage II Stage II: coccyx  Last BM:  12/25 type 7  Height:   Ht Readings from Last 1  Encounters:  12/04/20 '6\' 1"'  (1.854 m)    Weight:   Wt Readings from Last 1 Encounters:  12/04/20 58.3 kg   BMI:  Body mass index is 16.96 kg/m.  Estimated Nutritional Needs:   Kcal:  1950-2150  Protein:  95-105 grams  Fluid:  >1.95L/d    AmLarkin InaMS, RD, LDN RD pager number and weekend/on-call pager number located in [facility]

## 2020-12-14 NOTE — Progress Notes (Signed)
  Speech Language Pathology Treatment: Dysphagia  Patient Details Name: Brad [name] MRN: [id number] DOB: Jan 11, 1946 Today's Date: 12/14/2020 Time: 5462-7035 SLP Time Calculation (min) (ACUTE ONLY): 10 min  Assessment / Plan / Recommendation Clinical Impression  Pt showing improvements in swallowing function as compared to most recent ST encounter. Generalized weakness of oral musculature persists in setting of deconditioning. Pt with delayed bolus transit, and mild oral residuals with puree textures. Swallow initiation per palpation appeared delayed with reduced laryngeal elevation. No overt s/sx of aspiration with any PO. Recommend diet advancement to dysphagia 1 (puree) and thin liquids with medicines crushed in puree. Recommend full supervision and feeding assistance with all POs. No further ST needs identified.     HPI HPI: Pt is a 74 yo male presenting with AMS. CTH/CTA negative for acute findings; MRI pending. Per MD, there is concern for breakthrough seizure due to missed medications. PMH includes: prostate ca, anemia, arthritis, dementia, depression, DM, HTN, seizures, substance abuse. Previous MBS in June 2020 revealed normal oropharyngeal swallow but with pt c/o globus sensation. Esophagram the same day revealed moderate esophageal dysmotility.      SLP Plan  Discharge SLP treatment due to (comment) (maximum progress achieved)       Recommendations  Diet recommendations: Dysphagia 1 (puree);Thin liquid Liquids provided via: Cup Medication Administration: Crushed with puree Supervision: Full supervision/cueing for compensatory strategies;Staff to assist with self feeding Compensations: Slow rate;Small sips/bites;Minimize environmental distractions;Follow solids with liquid Postural Changes and/or Swallow Maneuvers: Seated upright 90 degrees;Upright 30-60 min after meal                Oral Care Recommendations: Oral care BID Follow up Recommendations: 24 hour  supervision/assistance SLP Visit Diagnosis: Dysphagia, unspecified (R13.10) Plan: Discharge SLP treatment due to (comment) (maximum progress achieved)       GO                Doloros Kwolek E Charlize Hathaway MA, CCC-SLP Acute Rehabilitation Services  12/14/2020, 1:00 PM

## 2020-12-14 NOTE — Progress Notes (Signed)
PT Cancellation Note  Patient Details Name: JEROME VIGLIONE MRN: [id number] DOB: December 04, 1946   Cancelled Treatment:    Reason Eval/Treat Not Completed: PT screened, no needs identified, will sign off - pt awaiting hospice placement, remains total care at this time. PT to sign off, please reconsult if needed.  Marye Round, PT Acute Rehabilitation Services Pager [phone]  Office [phone]    Truddie Coco 12/14/2020, 9:15 AM

## 2020-12-14 NOTE — Progress Notes (Signed)
Civil engineer, contracting [facility]) Hospital Liaison note.     Per request of the Palliative Medical Team, this patient is no longer a referral for [facility]. If ACC services are needed or to request residential hospice placement please contact the hospital liaison listed on AMION.   A Please do not hesitate to call with questions.     Thank you,    Elsie Saas, RN, [facility]       [facility] Liaison (listed on [facility] under Hospice [facility])     [phone]

## 2020-12-15 DIAGNOSIS — R569 Unspecified convulsions: Secondary | ICD-10-CM | POA: Diagnosis not present

## 2020-12-15 DIAGNOSIS — E785 Hyperlipidemia, unspecified: Secondary | ICD-10-CM

## 2020-12-15 DIAGNOSIS — I10 Essential (primary) hypertension: Secondary | ICD-10-CM | POA: Diagnosis not present

## 2020-12-15 DIAGNOSIS — G40919 Epilepsy, unspecified, intractable, without status epilepticus: Secondary | ICD-10-CM | POA: Diagnosis not present

## 2020-12-15 DIAGNOSIS — E876 Hypokalemia: Secondary | ICD-10-CM

## 2020-12-15 LAB — BASIC METABOLIC PANEL
Anion gap: 9 (ref 5–15)
BUN: <5 mg/dL — ABNORMAL LOW (ref 8–23)
CO2: 32 mmol/L (ref 22–32)
Calcium: 8.2 mg/dL — ABNORMAL LOW (ref 8.9–10.3)
Chloride: 95 mmol/L — ABNORMAL LOW (ref 98–111)
Creatinine, Ser: 0.64 mg/dL (ref 0.61–1.24)
GFR, Estimated: >60 mL/min (ref >60)
Glucose, Bld: 120 mg/dL — ABNORMAL HIGH (ref 70–99)
Potassium: 3.4 mmol/L — ABNORMAL LOW (ref 3.5–5.1)
Sodium: 136 mmol/L (ref 135–145)

## 2020-12-15 LAB — CBC
HCT: 30.8 % — ABNORMAL LOW (ref 39.0–52.0)
Hemoglobin: 9.7 g/dL — ABNORMAL LOW (ref 13.0–17.0)
MCH: 30.2 pg (ref 26.0–34.0)
MCHC: 31.5 g/dL (ref 30.0–36.0)
MCV: 96 fL (ref 80.0–100.0)
Platelets: 241 10*3/uL (ref 150–400)
RBC: 3.21 MIL/uL — ABNORMAL LOW (ref 4.22–5.81)
RDW: 11.9 % (ref 11.5–15.5)
WBC: 5.8 10*3/uL (ref 4.0–10.5)
nRBC: 0 % (ref 0.0–0.2)

## 2020-12-15 LAB — PHOSPHORUS: Phosphorus: 1.9 mg/dL — ABNORMAL LOW (ref 2.5–4.6)

## 2020-12-15 LAB — MAGNESIUM: Magnesium: 1.3 mg/dL — ABNORMAL LOW (ref 1.7–2.4)

## 2020-12-15 MED ORDER — POTASSIUM PHOSPHATES 15 MMOLE/5ML IV SOLN
20.0000 mmol | Freq: Once | INTRAVENOUS | Status: AC
  Administered 2020-12-15: 15:00:00 20 mmol via INTRAVENOUS
  Filled 2020-12-15 (×2): qty 6.67

## 2020-12-15 MED ORDER — MAGNESIUM SULFATE 4 GM/100ML IV SOLN
4.0000 g | Freq: Once | INTRAVENOUS | Status: AC
  Administered 2020-12-15: 12:00:00 4 g via INTRAVENOUS
  Filled 2020-12-15: qty 100

## 2020-12-15 NOTE — Progress Notes (Signed)
Patient ID: Brad [name], male   DOB: [birth year], 74 y.o.   MRN: [id number]       Palliative Medicine has signed off.   Please refer to [facility] to access patient's advanced directive documents. His living will is summarized in the PMT progress note from 12/09/20.   Please re-consult if we can be of additional assistance or need to actively re-engage with this patient and family.   Sherlean Foot, NP-C Palliative Medicine   Please call Palliative Medicine team phone with any questions [phone]. For individual providers please see AMION.   No charge

## 2020-12-15 NOTE — Progress Notes (Signed)
PROGRESS NOTE    Brad [name]  [id number] DOB: [birth year] DOA: 12/04/2020 PCP: [name], DO   Brief Narrative:  The patient is a 74 year old elderly African-American male with a past medical history significant for but not limited to history of prostate cancer, depression, type 2 diabetes mellitus, hypertension, hyperlipidemia, history of seizure disorder, history of substance abuse as well as other comorbidities who presented to the emergency room with altered mental status.  Upon arrival to the ED he is noted to be hypertensive with a systolic blood pressure [id number] but the rest of his vital signs were stable.  In the ED his white blood cell count was 9.9 and blood glucose was 75.  Any having a head CT given his altered mental status and it showed no acute intracranial abnormality.  CTA of the head neck was done as well.  He had a normal CT perfusion study and an unchanged 60% stenosis of the proximal left internal carotid artery secondary to mixed density atherosclerosis.  He is seen by neurology after speak with family the patient's presentation was felt to be secondary to seizure activity at home.  He is admitted for further evaluation and management.  Was given IV valproic acid and is noted the patient has been bedbound since 2018.  During his hospital course he had poor p.o. intake and palliative care was consulted.  Patient daughter made her father DNR and it was noted that the patient's living will documents that he does not want artificial means of feeding him.  Initially after palliative care discussions family was inclined towards comfort care but then changed her mind and wanted aggressive care.  Patient will be going to a skilled nursing facility once bed is available.  Assessment & Plan:   Principal Problem:   Seizure ([place]) Active Problems:   Hypertension   Hyperlipidemia   Diabetes ([place])   Breakthrough seizure ([place])   Pressure injury of skin  Breakthrough  Seizures -Likely secondary to medication noncompliance.  Has a history of underlying advanced dementia. -CT head and CTA head and neck: Negative for acute findings. -Valproic acid level: Subtherapeutic on admission.  EEG positive for seizures. -Patient was given loading dose of IV valproate 600 mg in the ED.    -Continue IV Ativan 2 mg as needed for seizures like activity. -Continue Depacon 500 mg twice daily-changed  IV to p.o. -Repeat valproic acid level: Therapeutic.  Repeat EEG on 12/25: Negative for seizures.  Appreciate neurology's recommendations. -On seizure precautions -Consult PT/OT recommend SNF if able to participate, if not then he needs LTAC. -PT/OT signed off thinking that patient was going to hospice but will reconsult them -We will consult TOC for assistance with placement and skilled nursing facility  Poor Oral Intake -12/26: Start taking PO however he has inadequate oral intake related to poor appetite. -Continue to assist patient with meals -Appreciate dietitian's help and now we have discontinued his boost breeze and will continue Magic cup 3 times daily with meals, Ensure Enlive p.o. 3 times daily, 30 mL of Prosource plus p.o. twice daily, and multivitamin with minerals daily -Continue with Dysphagia 1 Diet  Hypertension -Blood pressure is Stable.   -Continue Amlodipine 2.5 mg p.o. daily.   -Monitor monitor blood pressure closely.   -Continue IV Hydralazine as needed if SBP more than 170.  Hyperlipidemia -Continue Atorvastatin 20 mg po qHS  Hypokalemia -Patient's K+ was 3.4 -Replete with IV KPhos 20 mmol and with po KCl 40 mEQ BID x2 -Mag Level  was 1.3 -Continue to Monitor and Replete as Necessary -Repeat CMP in the AM   Hypomagnesemia -Mag Level was 1.3 -Replete with IV Mag Sulfate 4 grams -Continue to Monitor and Replete as Necessary -Repeat Mag Level in the AM   Hypophosphatemia -Patient's Phos Level was 1.9 -Replete with IV K Phos 20  mmol -Continue to monitor and replete as necessary -Repeat phosphorus level in the a.m.  Normocytic Anemia: -Baseline hemoglobin between 10-11.    Now hemoglobin/hematocrit is 9.7/30.8 -Continue to monitor H&H closely and transfuse as needed. -Continue to monitor for signs and symptoms of bleeding -Repeat CBC in the a.m.  Thrombocytopenia -Improved.   -Patient platelet count went from 131 is now 241  -Continue to monitor for signs and symptoms of bleeding; currently no overt bleeding noted-repeat CBC in a.m.  Severe protein calorie malnutrition -Estimated body mass index is 16.96 kg/m as calculated from the following:   Height as of this encounter: 6\' 1"  (1.854 m).   Weight as of this encounter: 58.3 kg. -Albumin-2.2.  Consulted dietitian-continue multivitamin and feeding supplement as per dietitian's recommendations as above.   -Monitor for signs for refeeding syndrome -Replace electrolytes.    History of substance abuse  -UDS negative this time.   Pressure Injury Pressure Injury 12/05/20 Coccyx Medial Stage 2 -  Partial thickness loss of dermis presenting as a shallow open injury with a red, pink wound bed without slough. 53mm diameter open circular abrasion, top layer skin not intact, red and blanchable, superior (Active)  12/05/20 2145  Location: Coccyx  Location Orientation: Medial  Staging: Stage 2 -  Partial thickness loss of dermis presenting as a shallow open injury with a red, pink wound bed without slough.  Wound Description (Comments): 54mm diameter open circular abrasion, top layer skin not intact, red and blanchable, superior to medial coccyx  Present on Admission: Yes  -C/w Wound care  Depression -C/w Fluoxetine 40 mg po Daily and Mirtazapine 30 mg po qHS and Temazepam 7.5 mg po qHS  Goals of care -Patient is DNR/DNI as per his living well.  It has also been noted in patient's living will documents that he would not want artificial means of  feeding. -Palliative was on board-initially patient sister (who is also HCPOA) was inclined towards comfort care however she changed her mind and became very rude with palliative NP & Dr. Jacqulyn Bath & wanted to start tube feedings which was against patient's living will.  -Neurology was consulted again & we agreed with short term tube feedings (10-14 days) however patient improved and started taking p.o. following day. Tube placement/feedings never started. -Palliative care is not following patient anymore.  Hospice consult was discontinued ad plan is to go to SNF  DVT prophylaxis: Enoxaparin 40 mg sq Daily Code Status: DO NOT RESUSCITATE  Family Communication: No family present at bedside  Disposition Plan: Pending discharge to SNF next 24 to 48 hours  Status is: Inpatient  Remains inpatient appropriate because:Unsafe d/c plan, IV treatments appropriate due to intensity of illness or inability to take PO and Inpatient level of care appropriate due to severity of illness   Dispo: The patient is from: Home              Anticipated d/c is to: SNF              Anticipated d/c date is: 1-2 days              Patient currently is not medically stable to d/c.  Consultants:   Neurology  Palliative Care Medicine    Procedures: CT Head  EEG IMPRESSION: This study showed evidence of epileptogenicity arising left temporoparietal region as well as mild diffuse encephalopathy, nonspecific etiology. No seizures were seen throughout the recording  Antimicrobials:  Anti-infectives (From admission, onward)   None        Subjective: Seen and examined at bedside he was resting and had no complaints.  No nausea or vomiting.  Felt okay.  Denies any chest pain or shortness of breath.  No other concerns expressed this time but did not really interact very much.  Objective: Vitals:   12/15/20 0317 12/15/20 0734 12/15/20 1242 12/15/20 1607  BP: (!) 144/56 118/62 (!) 159/69 (!) 151/63  Pulse: 93 67  72 69  Resp: 17 16 16 16   Temp: (!) 97.5 F (36.4 C) 98.5 F (36.9 C) 98.6 F (37 C) 99 F (37.2 C)  TempSrc: Oral Oral Oral Oral  SpO2: 100% 99% 100% 99%  Weight:      Height:        Intake/Output Summary (Last 24 hours) at 12/15/2020 1643 Last data filed at 12/15/2020 [id number] Gross per 24 hour  Intake 299.93 ml  Output 300 ml  Net -0.07 ml   Filed Weights   12/04/20 2310 12/04/20 2312  Weight: 58.3 kg 58.3 kg   Examination: Physical Exam:  Constitutional: The patient is a thin chronically ill-appearing African-American male currently in no acute distress appears calm and does appear comfortable today Eyes: Lids and conjunctivae normal, sclerae anicteric  ENMT: External Ears, Nose appear normal. Grossly normal hearing.  Neck: Appears normal, supple, no cervical masses, normal ROM, no appreciable thyromegaly Respiratory: Diminished to auscultation bilaterally, no wheezing, rales, rhonchi or crackles. Normal respiratory effort and patient is not tachypenic. No accessory muscle use.  Unlabored breathing Cardiovascular: RRR, no murmurs / rubs / gallops. S1 and S2 auscultated. No extremity edema. Abdomen: Soft, non-tender, non-distended. Bowel sounds positive.  GU: Deferred. Musculoskeletal: No clubbing / cyanosis of digits/nails. No joint deformity upper and lower extremities.  Skin: No rashes, lesions, ulcers on limited skin evaluation. No induration; Warm and dry.  Neurologic: CN 2-12 grossly intact with no focal deficits. Romberg sign cerebellar reflexes not assessed.  Psychiatric: Slightly impaired judgment and insight.  He is awake and alert. Normal mood and appropriate affect.   Data Reviewed: I have personally reviewed following labs and imaging studies  CBC: Recent Labs  Lab 12/11/20 0319 12/15/20 0244  WBC 5.7 5.8  HGB 9.5* 9.7*  HCT 28.5* 30.8*  MCV 91.9 96.0  PLT 131* [id number]   Basic Metabolic Panel: Recent Labs  Lab 12/11/20 0319 12/11/20 1354 12/12/20 0244  12/13/20 0220 12/14/20 0417 12/15/20 0244  NA 132* 132* 134* 136 138 136  K 2.7* 2.9* 3.1* 3.5 2.9* 3.4*  CL 94* 93* 96* 96* 97* 95*  CO2 28 26 28 29 30  32  GLUCOSE 86 93 94 77 105* 120*  BUN <5* <5* <5* <5* <5* <5*  CREATININE 0.72 0.71 0.63 0.66 0.76 0.64  CALCIUM 7.9* 7.9* 8.2* 8.0* 8.0* 8.2*  MG 1.4*  --  1.6* 1.6* 1.5* 1.3*  PHOS  --   --  2.4* 2.9 3.0 1.9*   GFR: Estimated Creatinine Clearance: 67.8 mL/min (by C-G formula based on SCr of 0.64 mg/dL). Liver Function Tests: Recent Labs  Lab 12/13/20 0220 12/14/20 0417  AST 18 17  ALT 11 11  ALKPHOS 41 41  BILITOT 0.5 0.5  PROT 5.0*  5.4*  ALBUMIN 2.1* 2.2*   No results for input(s): LIPASE, AMYLASE in the last 168 hours. No results for input(s): AMMONIA in the last 168 hours. Coagulation Profile: No results for input(s): INR, PROTIME in the last 168 hours. Cardiac Enzymes: No results for input(s): CKTOTAL, CKMB, CKMBINDEX, TROPONINI in the last 168 hours. BNP (last 3 results) No results for input(s): PROBNP in the last 8760 hours. HbA1C: No results for input(s): HGBA1C in the last 72 hours. CBG: Recent Labs  Lab 12/12/20 1235 12/12/20 1645 12/12/20 2105  GLUCAP 128* 87 98   Lipid Profile: No results for input(s): CHOL, HDL, LDLCALC, TRIG, CHOLHDL, LDLDIRECT in the last 72 hours. Thyroid Function Tests: No results for input(s): TSH, T4TOTAL, FREET4, T3FREE, THYROIDAB in the last 72 hours. Anemia Panel: No results for input(s): VITAMINB12, FOLATE, FERRITIN, TIBC, IRON, RETICCTPCT in the last 72 hours. Sepsis Labs: No results for input(s): PROCALCITON, LATICACIDVEN in the last 168 hours.  No results found for this or any previous visit (from the past 240 hour(s)).   RN Pressure Injury Documentation: Pressure Injury 12/05/20 Coccyx Medial Stage 2 -  Partial thickness loss of dermis presenting as a shallow open injury with a red, pink wound bed without slough. 21mm diameter open circular abrasion, top layer  skin not intact, red and blanchable, superior (Active)  12/05/20 2145  Location: Coccyx  Location Orientation: Medial  Staging: Stage 2 -  Partial thickness loss of dermis presenting as a shallow open injury with a red, pink wound bed without slough.  Wound Description (Comments): 54mm diameter open circular abrasion, top layer skin not intact, red and blanchable, superior to medial coccyx  Present on Admission: Yes    Estimated body mass index is 16.96 kg/m as calculated from the following:   Height as of this encounter: 6\' 1"  (1.854 m).   Weight as of this encounter: 58.3 kg.  Malnutrition Type:  Nutrition Problem: Inadequate oral intake Etiology: poor appetite   Malnutrition Characteristics:  Signs/Symptoms: meal completion < 50%   Nutrition Interventions:  Interventions: MVI,Boost Breeze,Prostat,Refer to RD note for recommendations   Radiology Studies: No results found.  Scheduled Meds: . (feeding supplement) PROSource Plus  30 mL Oral BID BM  . amLODipine  2.5 mg Oral Daily  . atorvastatin  20 mg Oral QHS  . divalproex  500 mg Oral Q12H  . enoxaparin (LOVENOX) injection  40 mg Subcutaneous Daily  . feeding supplement  237 mL Oral TID BM  . FLUoxetine  40 mg Oral Daily  . lubriderm seriously sensitive   Topical Daily  . mirtazapine  30 mg Oral QHS  . multivitamin with minerals  1 tablet Oral Daily  . potassium chloride  40 mEq Oral BID  . tamsulosin  0.4 mg Oral QHS   Continuous Infusions: . dextrose 5% lactated ringers 100 mL/hr at 12/15/20 1220  . potassium PHOSPHATE IVPB (in mmol) 20 mmol (12/15/20 1442)    LOS: 10 days   12/17/20, DO [facility] PAGER is on AMION  If 7PM-7AM, please contact night-coverage [email or web address]

## 2020-12-16 DIAGNOSIS — R569 Unspecified convulsions: Secondary | ICD-10-CM | POA: Diagnosis not present

## 2020-12-16 DIAGNOSIS — G40919 Epilepsy, unspecified, intractable, without status epilepticus: Secondary | ICD-10-CM | POA: Diagnosis not present

## 2020-12-16 DIAGNOSIS — E785 Hyperlipidemia, unspecified: Secondary | ICD-10-CM | POA: Diagnosis not present

## 2020-12-16 DIAGNOSIS — I10 Essential (primary) hypertension: Secondary | ICD-10-CM | POA: Diagnosis not present

## 2020-12-16 LAB — CBC WITH DIFFERENTIAL/PLATELET
Abs Immature Granulocytes: 0.03 10*3/uL (ref 0.00–0.07)
Basophils Absolute: 0 10*3/uL (ref 0.0–0.1)
Basophils Relative: 0 %
Eosinophils Absolute: 0 10*3/uL (ref 0.0–0.5)
Eosinophils Relative: 0 %
HCT: 26.7 % — ABNORMAL LOW (ref 39.0–52.0)
Hemoglobin: 8.8 g/dL — ABNORMAL LOW (ref 13.0–17.0)
Immature Granulocytes: 0 %
Lymphocytes Relative: 22 %
Lymphs Abs: 1.5 10*3/uL (ref 0.7–4.0)
MCH: 31.2 pg (ref 26.0–34.0)
MCHC: 33 g/dL (ref 30.0–36.0)
MCV: 94.7 fL (ref 80.0–100.0)
Monocytes Absolute: 1 10*3/uL (ref 0.1–1.0)
Monocytes Relative: 15 %
Neutro Abs: 4.3 10*3/uL (ref 1.7–7.7)
Neutrophils Relative %: 63 %
Platelets: 263 10*3/uL (ref 150–400)
RBC: 2.82 MIL/uL — ABNORMAL LOW (ref 4.22–5.81)
RDW: 12 % (ref 11.5–15.5)
WBC: 6.9 10*3/uL (ref 4.0–10.5)
nRBC: 0 % (ref 0.0–0.2)

## 2020-12-16 LAB — PHOSPHORUS: Phosphorus: 2.1 mg/dL — ABNORMAL LOW (ref 2.5–4.6)

## 2020-12-16 LAB — COMPREHENSIVE METABOLIC PANEL
ALT: 14 U/L (ref 0–44)
AST: 17 U/L (ref 15–41)
Albumin: 2.3 g/dL — ABNORMAL LOW (ref 3.5–5.0)
Alkaline Phosphatase: 46 U/L (ref 38–126)
Anion gap: 7 (ref 5–15)
BUN: 9 mg/dL (ref 8–23)
CO2: 31 mmol/L (ref 22–32)
Calcium: 8.2 mg/dL — ABNORMAL LOW (ref 8.9–10.3)
Chloride: 96 mmol/L — ABNORMAL LOW (ref 98–111)
Creatinine, Ser: 0.7 mg/dL (ref 0.61–1.24)
GFR, Estimated: >60 mL/min (ref >60)
Glucose, Bld: 111 mg/dL — ABNORMAL HIGH (ref 70–99)
Potassium: 4.7 mmol/L (ref 3.5–5.1)
Sodium: 134 mmol/L — ABNORMAL LOW (ref 135–145)
Total Bilirubin: 0.4 mg/dL (ref 0.3–1.2)
Total Protein: 5.7 g/dL — ABNORMAL LOW (ref 6.5–8.1)

## 2020-12-16 LAB — MAGNESIUM: Magnesium: 1.7 mg/dL (ref 1.7–2.4)

## 2020-12-16 MED ORDER — POTASSIUM & SODIUM PHOSPHATES 280-160-250 MG PO PACK
1.0000 | PACK | Freq: Three times a day (TID) | ORAL | Status: AC
  Administered 2020-12-16 (×3): 1 via ORAL
  Filled 2020-12-16 (×3): qty 1

## 2020-12-16 MED ORDER — MAGNESIUM SULFATE 2 GM/50ML IV SOLN
2.0000 g | Freq: Once | INTRAVENOUS | Status: AC
  Administered 2020-12-16: 09:00:00 2 g via INTRAVENOUS
  Filled 2020-12-16: qty 50

## 2020-12-16 NOTE — TOC Progression Note (Addendum)
Transition of Care [facility]) - Progression Note    Patient Details  Name: Brad [name] MRN: [id number] Date of Birth: 1946/02/24  Transition of [facility]) CM/SW Contact  Lorri Frederick, LCSW Phone Number: 12/16/2020, 11:23 AM  Clinical Narrative:   CSW initiated PASSR. FL2 completed.  Pt sent out in hub for SNF.  1420: Level 2 passr documents uploaded    Expected Discharge Plan: Hospice Medical Facility Barriers to Discharge: Hospice Bed not available  Expected Discharge Plan and Services Expected Discharge Plan: Hospice Medical Facility In-house Referral: Clinical Social Work Discharge Planning Services: CM Consult   Living arrangements for the past 2 months: Single Family Home                                       Social Determinants of Health (SDOH) Interventions    Readmission Risk Interventions No flowsheet data found.

## 2020-12-16 NOTE — Evaluation (Signed)
Physical Therapy Re- Evaluation Patient Details Name: Brad [name] MRN: [id number] DOB: [birth year] Today's Date: 12/16/2020   History of Present Illness  Pt is 74 yo male presenting with AMS. CTH/CTA negative for acute findings; MRI pending. Per MD, there is concern for breakthrough seizure due to missed medications. 12/19 EEG 2 focal motor seizures (RUE rhythmic twitching) suggestive of PRES. PMH includes: prostate ca, anemia, arthritis, dementia, depression, DM, HTN, seizures, substance abuse.  Noted that initial plan was for hospice at d/c but family has changed their minds and want aggressive care at [facility].  Pt unable to provide PLOF but appeared to be living at home.  Did note in MD note from 12/16/20 that pt has been bedbound since 2018.  Family not present to verify PLOF but per notes family has expressed pt is not at his baseline.    Clinical Impression  Pt admitted with the above diagnosis.  It appears pt had a very low baseline but per report is not at his baseline.  Pt presents with contractures throughout and unable to follow commands.  He was total A to EOB but did maintain balance with L UE support for short time , which is improvement from initial eval.  Pt with low rehab potential but will perform trial of PT.  Pt will likely need long term care at discharge, even after SNF placement.  Pt currently with functional limitations due to the deficits listed below (see PT Problem List). Pt will benefit from skilled PT to increase their independence and safety with mobility to allow discharge to the venue listed below.       Follow Up Recommendations Other (comment) (SNF vs long term care pending ability to participate)    Equipment Recommendations  Hospital bed;Other (comment) (lift)    Recommendations for Other Services       Precautions / Restrictions Precautions Precautions: Fall Precaution Comments: Seizures Required Braces or Orthoses: Other Brace Other Brace: R palm guard;       Mobility  Bed Mobility Overal bed mobility: Needs Assistance Bed Mobility: Rolling;Supine to Sit;Sit to Supine Rolling: Total assist   Supine to sit: Total assist;+2 for safety/equipment Sit to supine: +2 for safety/equipment;Total assist        Transfers                 General transfer comment: Unsafe/unable  Ambulation/Gait                Stairs            Wheelchair Mobility    Modified Rankin (Stroke Patients Only)       Balance Overall balance assessment: Needs assistance Sitting-balance support: Single extremity supported Sitting balance-Leahy Scale: Poor Sitting balance - Comments: Initially requiring min-mod A but able to progress to close supervision with pt propt on L elbow for 30 seconds     Standing balance-Leahy Scale: Zero Standing balance comment: unable/contractures                             Pertinent Vitals/Pain Pain Assessment: Faces Faces Pain Scale: Hurts little more Pain Location: With LE gentle stretching and transfers Pain Descriptors / Indicators: Guarding;Grimacing Pain Intervention(s): Monitored during session;Limited activity within patient's tolerance    Home Living Family/patient expects to be discharged to:: Skilled nursing facility                      Prior Function  Hand Dominance        Extremity/Trunk Assessment   Upper Extremity Assessment RUE Deficits / Details: Patient very rigid. Unable to fully assess PROM. No purposeful AROM noted during evaluation. Hand remains closed. Palm guard in place    Lower Extremity Assessment RLE Deficits / Details: contracture hip and knee flexion--able to extend hip to -30, knee to -30 LLE Deficits / Details: contracture hip and knee flexion--able to extend hip to -10, knee to -30    Cervical / Trunk Assessment Cervical / Trunk Assessment: Kyphotic Cervical / Trunk Exceptions: very rigid  Communication       Cognition Arousal/Alertness: Lethargic Behavior During Therapy: Flat affect Overall Cognitive Status: No family/caregiver present to determine baseline cognitive functioning                                 General Comments: Pt unable to follow simple command (squeeze fingers) and with no real communication.  Did state his name is Brad [name].  Per prior report, pt could hold conversation.      General Comments General comments (skin integrity, edema, etc.): Notified unit secretary to order Prevalon boots    Exercises     Assessment/Plan    PT Assessment Patient needs continued PT services (as a trial)  PT Problem List Decreased strength;Decreased range of motion;Decreased activity tolerance;Decreased balance;Decreased mobility;Decreased cognition;Decreased knowledge of use of DME;Impaired tone;Pain;Decreased safety awareness       PT Treatment Interventions DME instruction;Functional mobility training;Therapeutic activities;Therapeutic exercise;Balance training;Neuromuscular re-education;Cognitive remediation;Patient/family education;Manual techniques    PT Goals (Current goals can be found in the Care Plan section)  Acute Rehab PT Goals Patient Stated Goal: Unable PT Goal Formulation: Patient unable to participate in goal setting Time For Goal Achievement: 12/30/20 Potential to Achieve Goals: Poor    Frequency Min 2X/week   Barriers to discharge Decreased caregiver support      Co-evaluation               AM-PAC PT "6 Clicks" Mobility  Outcome Measure Help needed turning from your back to your side while in a flat bed without using bedrails?: Total Help needed moving from lying on your back to sitting on the side of a flat bed without using bedrails?: Total Help needed moving to and from a bed to a chair (including a wheelchair)?: Total Help needed standing up from a chair using your arms (e.g., wheelchair or bedside chair)?: Total Help needed to walk in  hospital room?: Total Help needed climbing 3-5 steps with a railing? : Total 6 Click Score: 6    End of Session Equipment Utilized During Treatment: Gait belt Activity Tolerance: Other (comment) (limited due to contractures/ability to participate) Patient left: in bed;with call bell/phone within reach;with bed alarm set Nurse Communication: Mobility status;Need for lift equipment PT Visit Diagnosis: Other symptoms and signs involving the nervous system (R29.898)    Time: 4944-9675 PT Time Calculation (min) (ACUTE ONLY): 19 min   Charges:   PT Evaluation $PT Re-evaluation: 1 Adelfa Koh, PT Acute Rehab Services Pager [phone] [facility] [phone]    Rayetta Humphrey 12/16/2020, 4:59 PM

## 2020-12-16 NOTE — [place] FL2 (Signed)
[place] MEDICAID FL2 LEVEL OF CARE SCREENING TOOL     IDENTIFICATION  Patient Name: Brad [name] Birthdate: 1946/08/17 Sex: male Admission Date (Current Location): 12/04/2020  [place] and [place] Number:  Haynes Bast [id number] Q Facility and Address:  [facility], [address] [place], [place] [address]      Provider Number: [id number]  Attending Physician Name and Address:  Merlene Laughter, DO  Relative Name and Phone Number:  Tonita Cong [phone]    Current Level of Care: Hospital Recommended Level of Care: Skilled Nursing Facility Prior Approval Number:    Date Approved/Denied:   PASRR Number:    Discharge Plan: SNF    Current Diagnoses: Patient Active Problem List   Diagnosis Date Noted  . Pressure injury of skin 12/06/2020  . Seizure (HCC) 12/05/2020  . Diabetes (HCC) 12/05/2020  . Breakthrough seizure (HCC) 12/05/2020  . Cervical spinal stenosis 12/14/2017  . Abnormal CT scan, neck 12/14/2017  . History of stroke 12/12/2017  . Hyperlipidemia 12/12/2017  . Seizure disorder (HCC) 12/12/2017  . KNEE PAIN, RIGHT 06/11/2009  . NECK PAIN 06/11/2009  . Hypertension 02/13/2008  . DENTAL CARIES 02/11/2008    Orientation RESPIRATION BLADDER Height & Weight      (not oriented)  Normal Incontinent Weight: 128 lb 8.5 oz (58.3 kg) Height:  6\' 1"  (185.4 cm)  BEHAVIORAL SYMPTOMS/MOOD NEUROLOGICAL BOWEL NUTRITION STATUS    Convulsions/Seizures Incontinent Diet (DYS 1.  See discharge summary)  AMBULATORY STATUS COMMUNICATION OF NEEDS Skin   Total Care Does not communicate Other (Comment) (scratches)                       Personal Care Assistance Level of Assistance  Bathing,Feeding,Dressing Bathing Assistance: Maximum assistance Feeding assistance: Maximum assistance Dressing Assistance: Maximum assistance     Functional Limitations Info  Sight,Hearing,Speech Sight Info: Adequate Hearing Info:  Adequate Speech Info: Impaired    SPECIAL CARE FACTORS FREQUENCY  PT (By licensed PT),OT (By licensed OT),Speech therapy     PT Frequency: 5x week OT Frequency: 5x week     Speech Therapy Frequency: 2x week      Contractures Contractures Info: Present    Additional Factors Info  Code Status,Allergies Code Status Info: DNR Allergies Info: NKA           Current Medications (12/16/2020):  This is the current hospital active medication list Current Facility-Administered Medications  Medication Dose Route Frequency Provider Last Rate Last Admin  . (feeding supplement) PROSource Plus liquid 30 mL  30 mL Oral BID BM Pahwani, Rinka R, MD   30 mL at 12/16/20 0850  . acetaminophen (TYLENOL) tablet 650 mg  650 mg Oral Q6H PRN 12/18/20, MD   650 mg at 12/14/20 0343  . amLODipine (NORVASC) tablet 2.5 mg  2.5 mg Oral Daily Pahwani, Rinka R, MD   2.5 mg at 12/16/20 0852  . atorvastatin (LIPITOR) tablet 20 mg  20 mg Oral QHS Pahwani, Rinka R, MD   20 mg at 12/15/20 2305  . dextrose 5 % in lactated ringers infusion   Intravenous Continuous Swayze, Ava, DO 100 mL/hr at 12/16/20 1000 New Bag at 12/16/20 1000  . divalproex (DEPAKOTE SPRINKLE) capsule 500 mg  500 mg Oral Q12H 12/18/20, RPH   500 mg at 12/16/20 12/18/20  . enoxaparin (LOVENOX) injection 40 mg  40 mg Subcutaneous Daily [id number], MD   40 mg at 12/16/20 0850  . feeding supplement (ENSURE  ENLIVE / ENSURE PLUS) liquid 237 mL  237 mL Oral TID BM Pahwani, Rinka R, MD   237 mL at 12/16/20 0853  . FLUoxetine (PROZAC) capsule 40 mg  40 mg Oral Daily Pahwani, Rinka R, MD   40 mg at 12/16/20 0853  . hydrALAZINE (APRESOLINE) injection 10 mg  10 mg Intravenous Q4H PRN Shela Leff, MD   10 mg at 12/16/20 0930  . LORazepam (ATIVAN) injection 2 mg  2 mg Intravenous Once PRN Shela Leff, MD      . lubriderm seriously sensitive lotion   Topical Daily Hammons, Theone Murdoch, [facility]   Given at 12/16/20 [id number]  . mirtazapine  (REMERON) tablet 30 mg  30 mg Oral QHS Pahwani, Rinka R, MD   30 mg at 12/15/20 2306  . morphine CONCENTRATE 10 MG/0.5ML oral solution 5 mg  5 mg Sublingual Q3H PRN Elie Confer B, NP   5 mg at 12/16/20 0930  . multivitamin with minerals tablet 1 tablet  1 tablet Oral Daily Pahwani, Rinka R, MD   1 tablet at 12/16/20 0851  . potassium & sodium phosphates (PHOS-NAK) 280-160-250 MG packet 1 packet  1 packet Oral [address] [place], [place]   1 packet at 12/16/20 [phone]  . tamsulosin (FLOMAX) capsule 0.4 mg  0.4 mg Oral QHS Pahwani, Rinka R, MD   0.4 mg at 12/15/20 2307  . temazepam (RESTORIL) capsule 7.5 mg  7.5 mg Oral QHS PRN Swayze, Ava, DO         Discharge Medications: Please see discharge summary for a list of discharge medications.  Relevant Imaging Results:  Relevant Lab Results:   Additional Information SSN [id number]  Joanne Chars, LCSW

## 2020-12-16 NOTE — Progress Notes (Signed)
OT Cancellation Note  Patient Details Name: Brad [name] MRN: [id number] DOB: 01-03-1946   Cancelled Treatment:    Reason Eval/Treat Not Completed: OT screened, no needs identified, will sign off. Patient seen for OT eval on 12/21. At that time, patient was unable to follow verbal commands for participation with skilled OT. After initial eval, patient seen for 1 more OT therapy session to assess patient's level of participation. Noted continued inability to participate with skilled therapy 2/2 deficits not limited to inability to follow 1-step verbal commands consistently, generalized weakness, severe contractures to RUE at shoulder, elbow, wrist, and digits, and resistance to passive movement of RUE and RLE. Patient also unable to initiate or assist with bed mobility or functional transfers requiring Total A grossly for self-care tasks at bed level and for repositioning to maintain skin integrity. Recommendation for LTC as patient is unable to participate with skilled rehab.   Ivadell Gaul R Howerton-Davis 12/16/2020, 8:09 AM

## 2020-12-16 NOTE — Progress Notes (Signed)
PROGRESS NOTE    Brad [name]  PZW:[id number] DOB: 21-Jul-1946 DOA: 12/04/2020 PCP: Karl Ito, DO   Brief Narrative:  The patient is a 74 year old elderly African-American male with a past medical history significant for but not limited to history of prostate cancer, depression, type 2 diabetes mellitus, hypertension, hyperlipidemia, history of seizure disorder, history of substance abuse as well as other comorbidities who presented to the emergency room with altered mental status.  Upon arrival to the ED he is noted to be hypertensive with a systolic blood pressure 190s but the rest of his vital signs were stable.  In the ED his white blood cell count was 9.9 and blood glucose was 75.  Any having a head CT given his altered mental status and it showed no acute intracranial abnormality.  CTA of the head neck was done as well.  He had a normal CT perfusion study and an unchanged 60% stenosis of the proximal left internal carotid artery secondary to mixed density atherosclerosis.  He is seen by neurology after speak with family the patient's presentation was felt to be secondary to seizure activity at home.  He is admitted for further evaluation and management.  Was given IV valproic acid and is noted the patient has been bedbound since 2018.  During his hospital course he had poor p.o. intake and palliative care was consulted.  Patient daughter made her father DNR and it was noted that the patient's living will documents that he does not want artificial means of feeding him.  Initially after palliative care discussions family was inclined towards comfort care but then changed her mind and wanted aggressive care.  Patient will be going to a skilled nursing facility once bed is available and insurance authorization is obtained. Electrolytes are being replete.   Assessment & Plan:   Principal Problem:   Seizure (HCC) Active Problems:   Hypertension   Hyperlipidemia   Diabetes (HCC)   Breakthrough  seizure (HCC)   Pressure injury of skin  Breakthrough Seizures -Likely secondary to medication noncompliance.  Has a history of underlying advanced dementia. -CT head and CTA head and neck: Negative for acute findings. -Valproic acid level: Subtherapeutic on admission.  EEG positive for seizures. -Patient was given loading dose of IV valproate 600 mg in the ED.    -Continue IV Ativan 2 mg as needed for seizures like activity. -Continue Depacon 500 mg twice daily-changed  IV to p.o. -Repeat valproic acid level: Therapeutic.  Repeat EEG on 12/25: Negative for seizures.  Appreciate neurology's recommendations. -On seizure precautions -Consult PT/OT recommend SNF if able to participate, if not then he needs LTAC. -PT/OT signed off thinking that patient was going to hospice but will reconsult them -We will consult TOC for assistance with placement and skilled nursing facility and workup has initiated   Poor Oral Intake -12/26: Start taking PO however he has inadequate oral intake related to poor appetite. -Continue to assist patient with meals -Appreciate dietitian's help and now we have discontinued his boost breeze and will continue Magic cup 3 times daily with meals, Ensure Enlive p.o. 3 times daily, 30 mL of Prosource plus p.o. twice daily, and multivitamin with minerals daily -Continue with Dysphagia 1 Diet  Hypertension -Blood pressure is Stable.   -Continue Amlodipine 2.5 mg p.o. daily.   -Continue to monitor blood pressure closely.   -BP was  -Continue IV Hydralazine as needed if SBP more than 170.  Hyperlipidemia -Continue Atorvastatin 20 mg po qHS  Hypokalemia -Patient's  K+ was 3.4 and improved to 4.7 -Mag Level was 1.3 and improved to 1.7 -Continue to Monitor and Replete as Necessary -Repeat CMP in the AM   Hypomagnesemia -Mag Level was 1.3 and was 1.7 -Replete with IV Mag Sulfate 2 grams today  -Continue to Monitor and Replete as Necessary -Repeat Mag Level in  the AM   Hypophosphatemia -Patient's Phos Level was 1.9 and is now 2.1 -Replete with po Phos-NAK  -Continue to monitor and replete as necessary -Repeat phosphorus level in the a.m.  Hyponatremia -Mild as Patient's Sodium went from 136 -> 134 -Replete as above -Continue to Monitor and repeat CMP in AM   Normocytic Anemia -Baseline hemoglobin between 10-11.     -Hemoglobin/hematocrit went from 9.7/30.8 -> 8.8/26.7 -Continue to monitor H&H closely and transfuse as needed. -Continue to monitor for signs and symptoms of bleeding -Repeat CBC in the a.m.  Thrombocytopenia -Improved.   -Patient platelet count went from 131 is now 241 yesterday and is now 263 -Continue to monitor for signs and symptoms of bleeding; currently no overt bleeding noted-repeat CBC in a.m.  Severe protein calorie malnutrition -Estimated body mass index is 16.96 kg/m as calculated from the following:   Height as of this encounter: 6\' 1"  (1.854 m).   Weight as of this encounter: 58.3 kg. -Albumin-2.2.  Consulted dietitian-continue multivitamin and feeding supplement as per dietitian's recommendations as above.   -Monitor for signs for refeeding syndrome -Replace electrolytes.    History of substance abuse  -UDS negative this time.   Pressure Injury Pressure Injury 12/05/20 Coccyx Medial Stage 2 -  Partial thickness loss of dermis presenting as a shallow open injury with a red, pink wound bed without slough. 7mm diameter open circular abrasion, top layer skin not intact, red and blanchable, superior (Active)  12/05/20 2145  Location: Coccyx  Location Orientation: Medial  Staging: Stage 2 -  Partial thickness loss of dermis presenting as a shallow open injury with a red, pink wound bed without slough.  Wound Description (Comments): 53mm diameter open circular abrasion, top layer skin not intact, red and blanchable, superior to medial coccyx  Present on Admission: Yes  -C/w Wound care  Depression -C/w  Fluoxetine 40 mg po Daily and Mirtazapine 30 mg po qHS and Temazepam 7.5 mg po qHS  Goals of care -Patient is DNR/DNI as per his living well.  It has also been noted in patient's living will documents that he would not want artificial means of feeding. -Palliative was on board-initially patient sister (who is also HCPOA) was inclined towards comfort care however she changed her mind and became very rude with palliative NP & Dr. Doristine Bosworth & wanted to start tube feedings which was against patient's living will.  -Neurology was consulted again & we agreed with short term tube feedings (10-14 days) however patient improved and started taking p.o. following day. Tube placement/feedings never started. -Palliative care is not following patient anymore.  Hospice consult was discontinued ad plan is to go to SNF  DVT prophylaxis: Enoxaparin 40 mg sq Daily Code Status: DO NOT RESUSCITATE  Family Communication: No family present at bedside  Disposition Plan: Pending discharge to SNF next 24 to 48 hours when bed is available and insurance authorization is obtained   Status is: Inpatient  Remains inpatient appropriate because:Unsafe d/c plan, IV treatments appropriate due to intensity of illness or inability to take PO and Inpatient level of care appropriate due to severity of illness   Dispo: The patient is  from: Home              Anticipated d/c is to: SNF              Anticipated d/c date is: 1-2 days              Patient currently is not medically stable to d/c.  Consultants:   Neurology  Palliative Care Medicine    Procedures: CT Head  EEG IMPRESSION: This study showed evidence of epileptogenicity arising left temporoparietal region as well as mild diffuse encephalopathy, nonspecific etiology. No seizures were seen throughout the recording  Antimicrobials:  Anti-infectives (From admission, onward)   None        Subjective: Seen and examined at bedside he continues to be resting in  place continues to have contractures and not very interactive this time.  No other concerns or complaints at this time and no acute events reported by nursing.  Objective: Vitals:   12/16/20 0007 12/16/20 0444 12/16/20 0855 12/16/20 1228  BP: (!) 149/80 (!) 147/69 (!) 171/60 130/74  Pulse: 78 72 68 68  Resp: 18 18 16 16   Temp: 98.5 F (36.9 C) 98.7 F (37.1 C) 98.8 F (37.1 C) 98.7 F (37.1 C)  TempSrc: Oral Oral Oral Oral  SpO2: 100% 100% 100% 97%  Weight:      Height:        Intake/Output Summary (Last 24 hours) at 12/16/2020 1531 Last data filed at 12/16/2020 1500 Gross per 24 hour  Intake 4333.51 ml  Output 4000 ml  Net 333.51 ml   Filed Weights   12/04/20 2310 12/04/20 2312  Weight: 58.3 kg 58.3 kg   Examination: Physical Exam:  Constitutional: Patient is a thin chronically ill-appearing African-American male who is calm and in no acute distress.   Eyes: Lids and conjunctivae normal, sclerae anicteric  ENMT: External Ears, Nose appear normal. Grossly normal hearing.  Neck: Appears normal, supple, no cervical masses, normal ROM, no appreciable thyromegaly; no JVD Respiratory: Diminished to auscultation bilaterally with coarse breath sounds, no wheezing, rales, rhonchi or crackles. Normal respiratory effort and patient is not tachypenic. No accessory muscle use.  Unlabored breathing Cardiovascular: RRR, no murmurs / rubs / gallops. S1 and S2 auscultated. No extremity edema.  Abdomen: Soft, non-tender, non-distended. Bowel sounds positive.  GU: Deferred. Musculoskeletal: No clubbing / cyanosis of digits/nails.  Has some contractures Skin: No rashes, lesions, ulcers on limited skin evaluation I did not turn him to view his coccyx depression. No induration; Warm and dry.  Neurologic: CN 2-12 grossly intact with no focal deficits.Romberg sign cerebellar reflexes not assessed.  Psychiatric: Impaired judgment and insight.  He is awake but he is not fully alert and oriented x  3. Normal mood and appropriate affect.   Data Reviewed: I have personally reviewed following labs and imaging studies  CBC: Recent Labs  Lab 12/11/20 0319 12/15/20 0244 12/16/20 0305  WBC 5.7 5.8 6.9  NEUTROABS  --   --  4.3  HGB 9.5* 9.7* 8.8*  HCT 28.5* 30.8* 26.7*  MCV 91.9 96.0 94.7  PLT 131* 241 [id number]   Basic Metabolic Panel: Recent Labs  Lab 12/12/20 0244 12/13/20 0220 12/14/20 0417 12/15/20 0244 12/16/20 0305  NA 134* 136 138 136 134*  K 3.1* 3.5 2.9* 3.4* 4.7  CL 96* 96* 97* 95* 96*  CO2 28 29 30  32 31  GLUCOSE 94 77 105* 120* 111*  BUN <5* <5* <5* <5* 9  CREATININE 0.63 0.66 0.76 0.64  0.70  CALCIUM 8.2* 8.0* 8.0* 8.2* 8.2*  MG 1.6* 1.6* 1.5* 1.3* 1.7  PHOS 2.4* 2.9 3.0 1.9* 2.1*   GFR: Estimated Creatinine Clearance: 67.8 mL/min (by C-G formula based on SCr of 0.7 mg/dL). Liver Function Tests: Recent Labs  Lab 12/13/20 0220 12/14/20 0417 12/16/20 0305  AST 18 17 17   ALT 11 11 14   ALKPHOS 41 41 46  BILITOT 0.5 0.5 0.4  PROT 5.0* 5.4* 5.7*  ALBUMIN 2.1* 2.2* 2.3*   No results for input(s): LIPASE, AMYLASE in the last 168 hours. No results for input(s): AMMONIA in the last 168 hours. Coagulation Profile: No results for input(s): INR, PROTIME in the last 168 hours. Cardiac Enzymes: No results for input(s): CKTOTAL, CKMB, CKMBINDEX, TROPONINI in the last 168 hours. BNP (last 3 results) No results for input(s): PROBNP in the last 8760 hours. HbA1C: No results for input(s): HGBA1C in the last 72 hours. CBG: Recent Labs  Lab 12/12/20 1235 12/12/20 1645 12/12/20 2105  GLUCAP 128* 87 98   Lipid Profile: No results for input(s): CHOL, HDL, LDLCALC, TRIG, CHOLHDL, LDLDIRECT in the last 72 hours. Thyroid Function Tests: No results for input(s): TSH, T4TOTAL, FREET4, T3FREE, THYROIDAB in the last 72 hours. Anemia Panel: No results for input(s): VITAMINB12, FOLATE, FERRITIN, TIBC, IRON, RETICCTPCT in the last 72 hours. Sepsis Labs: No results for  input(s): PROCALCITON, LATICACIDVEN in the last 168 hours.  No results found for this or any previous visit (from the past 240 hour(s)).   RN Pressure Injury Documentation: Pressure Injury 12/05/20 Coccyx Medial Stage 2 -  Partial thickness loss of dermis presenting as a shallow open injury with a red, pink wound bed without slough. 8mm diameter open circular abrasion, top layer skin not intact, red and blanchable, superior (Active)  12/05/20 2145  Location: Coccyx  Location Orientation: Medial  Staging: Stage 2 -  Partial thickness loss of dermis presenting as a shallow open injury with a red, pink wound bed without slough.  Wound Description (Comments): 26mm diameter open circular abrasion, top layer skin not intact, red and blanchable, superior to medial coccyx  Present on Admission: Yes    Estimated body mass index is 16.96 kg/m as calculated from the following:   Height as of this encounter: 6\' 1"  (1.854 m).   Weight as of this encounter: 58.3 kg.  Malnutrition Type:  Nutrition Problem: Inadequate oral intake Etiology: poor appetite   Malnutrition Characteristics:  Signs/Symptoms: meal completion < 50%   Nutrition Interventions:  Interventions: MVI,Boost Breeze,Prostat,Refer to RD note for recommendations   Radiology Studies: No results found.  Scheduled Meds: . (feeding supplement) PROSource Plus  30 mL Oral BID BM  . amLODipine  2.5 mg Oral Daily  . atorvastatin  20 mg Oral QHS  . divalproex  500 mg Oral Q12H  . enoxaparin (LOVENOX) injection  40 mg Subcutaneous Daily  . feeding supplement  237 mL Oral TID BM  . FLUoxetine  40 mg Oral Daily  . lubriderm seriously sensitive   Topical Daily  . mirtazapine  30 mg Oral QHS  . multivitamin with minerals  1 tablet Oral Daily  . potassium & sodium phosphates  1 packet Oral Q8H  . tamsulosin  0.4 mg Oral QHS   Continuous Infusions: . dextrose 5% lactated ringers 100 mL/hr at 12/16/20 1000    LOS: 11 days   Kerney Elbe, DO [facility] PAGER is on [place]  If 7PM-7AM, please contact night-coverage [email or web address]

## 2020-12-16 NOTE — Progress Notes (Signed)
    RE: Brad [name]        Date of Birth: 2046/08/02      Date:   12/16/20       To Whom It May Concern:  Please be advised that the above-named patient will require a short-term nursing home stay - anticipated 30 days or less for rehabilitation and strengthening.  The plan is for return home.                 MD signature                Date

## 2020-12-17 ENCOUNTER — Inpatient Hospital Stay (HOSPITAL_COMMUNITY): Payer: Medicare Other

## 2020-12-17 DIAGNOSIS — I10 Essential (primary) hypertension: Secondary | ICD-10-CM | POA: Diagnosis not present

## 2020-12-17 DIAGNOSIS — G40919 Epilepsy, unspecified, intractable, without status epilepticus: Secondary | ICD-10-CM | POA: Diagnosis not present

## 2020-12-17 DIAGNOSIS — E785 Hyperlipidemia, unspecified: Secondary | ICD-10-CM | POA: Diagnosis not present

## 2020-12-17 DIAGNOSIS — R509 Fever, unspecified: Secondary | ICD-10-CM

## 2020-12-17 DIAGNOSIS — R569 Unspecified convulsions: Secondary | ICD-10-CM | POA: Diagnosis not present

## 2020-12-17 LAB — CBC WITH DIFFERENTIAL/PLATELET
Abs Immature Granulocytes: 0.05 10*3/uL (ref 0.00–0.07)
Basophils Absolute: 0 10*3/uL (ref 0.0–0.1)
Basophils Relative: 0 %
Eosinophils Absolute: 0 10*3/uL (ref 0.0–0.5)
Eosinophils Relative: 0 %
HCT: 26 % — ABNORMAL LOW (ref 39.0–52.0)
Hemoglobin: 8.1 g/dL — ABNORMAL LOW (ref 13.0–17.0)
Immature Granulocytes: 1 %
Lymphocytes Relative: 14 %
Lymphs Abs: 1.4 10*3/uL (ref 0.7–4.0)
MCH: 30.2 pg (ref 26.0–34.0)
MCHC: 31.2 g/dL (ref 30.0–36.0)
MCV: 97 fL (ref 80.0–100.0)
Monocytes Absolute: 1.1 10*3/uL — ABNORMAL HIGH (ref 0.1–1.0)
Monocytes Relative: 11 %
Neutro Abs: 7.1 10*3/uL (ref 1.7–7.7)
Neutrophils Relative %: 74 %
Platelets: 301 10*3/uL (ref 150–400)
RBC: 2.68 MIL/uL — ABNORMAL LOW (ref 4.22–5.81)
RDW: 12.2 % (ref 11.5–15.5)
WBC: 9.6 10*3/uL (ref 4.0–10.5)
nRBC: 0 % (ref 0.0–0.2)

## 2020-12-17 LAB — URINALYSIS, ROUTINE W REFLEX MICROSCOPIC
Bilirubin Urine: NEGATIVE
Glucose, UA: NEGATIVE mg/dL
Ketones, ur: NEGATIVE mg/dL
Nitrite: NEGATIVE
Protein, ur: NEGATIVE mg/dL
Specific Gravity, Urine: 1.012 (ref 1.005–1.030)
WBC, UA: >50 WBC/hpf — ABNORMAL HIGH (ref 0–5)
pH: 8 (ref 5.0–8.0)

## 2020-12-17 LAB — COMPREHENSIVE METABOLIC PANEL
ALT: 12 U/L (ref 0–44)
AST: 17 U/L (ref 15–41)
Albumin: 2.2 g/dL — ABNORMAL LOW (ref 3.5–5.0)
Alkaline Phosphatase: 42 U/L (ref 38–126)
Anion gap: 13 (ref 5–15)
BUN: 13 mg/dL (ref 8–23)
CO2: 27 mmol/L (ref 22–32)
Calcium: 8.3 mg/dL — ABNORMAL LOW (ref 8.9–10.3)
Chloride: 93 mmol/L — ABNORMAL LOW (ref 98–111)
Creatinine, Ser: 0.71 mg/dL (ref 0.61–1.24)
GFR, Estimated: >60 mL/min (ref >60)
Glucose, Bld: 109 mg/dL — ABNORMAL HIGH (ref 70–99)
Potassium: 4.7 mmol/L (ref 3.5–5.1)
Sodium: 133 mmol/L — ABNORMAL LOW (ref 135–145)
Total Bilirubin: 0.6 mg/dL (ref 0.3–1.2)
Total Protein: 5.6 g/dL — ABNORMAL LOW (ref 6.5–8.1)

## 2020-12-17 LAB — PROCALCITONIN: Procalcitonin: <0.1 ng/mL

## 2020-12-17 LAB — MAGNESIUM: Magnesium: 1.7 mg/dL (ref 1.7–2.4)

## 2020-12-17 LAB — LACTIC ACID, PLASMA: Lactic Acid, Venous: 1.2 mmol/L (ref 0.5–1.9)

## 2020-12-17 LAB — PHOSPHORUS: Phosphorus: 3.6 mg/dL (ref 2.5–4.6)

## 2020-12-17 MED ORDER — MAGNESIUM SULFATE 2 GM/50ML IV SOLN
2.0000 g | Freq: Once | INTRAVENOUS | Status: AC
  Administered 2020-12-17: 2 g via INTRAVENOUS
  Filled 2020-12-17: qty 50

## 2020-12-17 MED ORDER — DEXTROSE-NACL 5-0.45 % IV SOLN
INTRAVENOUS | Status: DC
  Administered 2020-12-20: 1000 mL via INTRAVENOUS

## 2020-12-17 MED ORDER — ACETAMINOPHEN 650 MG RE SUPP
650.0000 mg | Freq: Four times a day (QID) | RECTAL | Status: DC | PRN
  Administered 2020-12-17 (×2): 650 mg via RECTAL
  Filled 2020-12-17 (×2): qty 1

## 2020-12-17 MED ORDER — SODIUM CHLORIDE 0.9 % IV SOLN
1.0000 g | Freq: Every day | INTRAVENOUS | Status: AC
  Administered 2020-12-17 – 2020-12-21 (×5): 1 g via INTRAVENOUS
  Filled 2020-12-17 (×5): qty 10

## 2020-12-17 NOTE — Progress Notes (Signed)
PROGRESS NOTE    Brad [name]  [id number] DOB: 1946/05/23 DOA: 12/04/2020 PCP: [name], DO   Brief Narrative:  The patient is a 74 year old elderly African-American male with a past medical history significant for but not limited to history of prostate cancer, depression, type 2 diabetes mellitus, hypertension, hyperlipidemia, history of seizure disorder, history of substance abuse as well as other comorbidities who presented to the emergency room with altered mental status.  Upon arrival to the ED he is noted to be hypertensive with a systolic blood pressure [id number] but the rest of his vital signs were stable.  In the ED his white blood cell count was 9.9 and blood glucose was 75.  Any having a head CT given his altered mental status and it showed no acute intracranial abnormality.  CTA of the head neck was done as well.  He had a normal CT perfusion study and an unchanged 60% stenosis of the proximal left internal carotid artery secondary to mixed density atherosclerosis.  He is seen by neurology after speak with family the patient's presentation was felt to be secondary to seizure activity at home.  He is admitted for further evaluation and management.  Was given IV valproic acid and is noted the patient has been bedbound since 2018.  During his hospital course he had poor p.o. intake and palliative care was consulted.  Patient daughter made her father DNR and it was noted that the patient's living will documents that he does not want artificial means of feeding him.  Initially after palliative care discussions family was inclined towards comfort care but then changed her mind and wanted aggressive care.  Patient will be going to a skilled nursing facility once bed is available and insurance authorization is obtained. Electrolytes are being replete.   12/17/20: Overnight he spiked a temperature of 100.9. He was pan-Cultured and worked up and UA indicitive of UTI. CXR this AM done and showed  "Small focus of opacity in the central right upper lobe, most likely atelectasis. Consider a focus of pneumonia if there are consistent clinical findings. No other evidence of acute cardiopulmonary disease." Will start Empiric IV Ceftriaxone. Will also get SLP again to evaluate for aspiration given that he was pocketing his food and medications not swallowing.   Assessment & Plan:   Principal Problem:   Seizure ([place]) Active Problems:   Hypertension   Hyperlipidemia   Diabetes ([place])   Breakthrough seizure ([place])   Pressure injury of skin  Breakthrough Seizures -Likely secondary to medication noncompliance.  Has a history of underlying advanced dementia. -CT head and CTA head and neck: Negative for acute findings. -Valproic acid level: Subtherapeutic on admission.  EEG positive for seizures. -Patient was given loading dose of IV valproate 600 mg in the ED.    -Continue IV Ativan 2 mg as needed for seizures like activity. -Continue Depacon 500 mg twice daily-changed  IV to p.o. -Repeat valproic acid level: Therapeutic.  Repeat EEG on 12/25: Negative for seizures.  Appreciate neurology's recommendations. -On seizure precautions -Consult PT/OT recommend SNF if able to participate, if not then he needs LTAC. -PT/OT signed off thinking that patient was going to hospice but will reconsult them -We will consult TOC for assistance with placement and skilled nursing facility and workup has initiated and pending   Poor Oral Intake -12/26: Start taking PO however he has inadequate oral intake related to poor appetite. -Continue to assist patient with meals -Appreciate dietitian's help and now we have discontinued  his boost breeze and will continue Magic cup 3 times daily with meals, Ensure Enlive p.o. 3 times daily, 30 mL of Prosource plus p.o. twice daily, and multivitamin with minerals daily -Continue with Dysphagia 1 Diet -Repeat SLP evaluation given that he was noted to be pocketing his food  and not swallowing  Fever  -Recurrent. Had a Temp of 100.9 early this AM and 100.5 this Afternoon -LA was 1.2 and PCT was <0.10 -Has no Leukocytosis -Pan-Cx and pending blood cultures x2, urinalysis and urine culture as well as chest x-ray -Urinalysis showed a hazy appearance with yellow color urine, negative glucose, small hemoglobin, moderate leukocytes, negative nitrite, rare bacteria, 11-20 RBCs per high-power field and greater than 50 WBCs noted on urinalysis -Urine culture still pending as well as blood cultures -Chest x-ray today showed "Small focus of opacity in the central right upper lobe, most likely atelectasis. Consider a focus of pneumonia if there are consistent clinical findings. No other evidence of acute cardiopulmonary disease." -We will empirically start IV ceftriaxone and may need azithromycin or escalation to Unasyn for possible aspiration -Continue to monitor temperature curve and WBC -Repeat CBC in a.m. and continue with supportive care and antipyretics  Hypertension -Blood pressure is Stable.   -Continue Amlodipine 2.5 mg p.o. daily.   -Continue to monitor blood pressure closely.   -BP was 140/63 -Continue IV Hydralazine as needed if SBP more than 170.  Hyperlipidemia -Continue Atorvastatin 20 mg po qHS  Hypokalemia -Patient's K+ was 3.4 and improved to 4.7 -Mag Level was 1.3 and improved to 1.7 and will replete again -Continue to Monitor and Replete as Necessary -Repeat CMP in the AM   Hypomagnesemia -Mag Level was 1.3 and was 1.7 -Replete with IV Mag Sulfate 2 grams again today  -Continue to Monitor and Replete as Necessary -Repeat Mag Level in the AM   Hypophosphatemia -Patient's Phos Level was 1.9 and is improved to 3.6 after repletion with p.o. Phos-NAK  -Continue to monitor and replete as necessary -Repeat phosphorus level in the a.m.  Hyponatremia -Mild as Patient's Sodium went from 136 -> 134 -> 133 -Replete as above -Continue to  Monitor and repeat CMP in AM   Normocytic Anemia -Baseline hemoglobin between 10-11.     -Hemoglobin/hematocrit went from 9.7/30.8 -> 8.8/26.7 -> 8.1/26.0 -Continue to monitor H&H closely and transfuse as needed. -Continue to monitor for signs and symptoms of bleeding -Repeat CBC in the a.m.  Thrombocytopenia -Improved.   -Patient platelet count went from 131 -> 241 -> 263 -> 301 -Continue to monitor for signs and symptoms of bleeding; currently no overt bleeding noted-repeat CBC in a.m.  Severe protein calorie malnutrition -Estimated body mass index is 16.96 kg/m as calculated from the following:   Height as of this encounter: 6\' 1"  (1.854 m).   Weight as of this encounter: 58.3 kg. -Albumin-2.2.  Consulted dietitian-continue multivitamin and feeding supplement as per dietitian's recommendations as above.   -Monitor for signs for refeeding syndrome -Replace electrolytes as necessary.   Laurey Arrow SLP evaluation given that he is pocketing his pills and food -We will also initiate calorie count and reconsult Dietary   History of substance abuse  -UDS negative this time.   Pressure Injury Pressure Injury 12/05/20 Coccyx Medial Stage 2 -  Partial thickness loss of dermis presenting as a shallow open injury with a red, pink wound bed without slough. 74mm diameter open circular abrasion, top layer skin not intact, red and blanchable, superior (Active)  12/05/20 2145  Location: Coccyx  Location Orientation: Medial  Staging: Stage 2 -  Partial thickness loss of dermis presenting as a shallow open injury with a red, pink wound bed without slough.  Wound Description (Comments): 43mm diameter open circular abrasion, top layer skin not intact, red and blanchable, superior to medial coccyx  Present on Admission: Yes  -C/w Wound care  Depression -C/w Fluoxetine 40 mg po Daily and Mirtazapine 30 mg po qHS and Temazepam 7.5 mg po qHS  Goals of care -Patient is DNR/DNI as per his living well.   It has also been noted in patient's living will documents that he would not want artificial means of feeding. -Palliative was on board-initially patient sister (who is also HCPOA) was inclined towards comfort care however she changed her mind and became very rude with palliative NP & Dr. Doristine Bosworth & wanted to start tube feedings which was against patient's living will.  -Neurology was consulted again & we agreed with short term tube feedings (10-14 days) however patient improved and started taking p.o. following day. Tube placement/feedings never started.  Will initiate calorie count to reassess -Palliative care is not following patient anymore.  Hospice consult was discontinued ad plan is to go to SNF  DVT prophylaxis: Enoxaparin 40 mg sq Daily Code Status: DO NOT RESUSCITATE  Family Communication: No family present at bedside  Disposition Plan: Pending discharge to SNF next 24 to 48 hours when bed is available and insurance authorization is obtained   Status is: Inpatient  Remains inpatient appropriate because:Unsafe d/c plan, IV treatments appropriate due to intensity of illness or inability to take PO and Inpatient level of care appropriate due to severity of illness   Dispo: The patient is from: Home              Anticipated d/c is to: SNF              Anticipated d/c date is: 1-2 days              Patient currently is not medically stable to d/c.  Consultants:   Neurology  Palliative Care Medicine    Procedures: CT Head  EEG IMPRESSION: This study showed evidence of epileptogenicity arising left temporoparietal region as well as mild diffuse encephalopathy, nonspecific etiology. No seizures were seen throughout the recording  Antimicrobials:  Anti-infectives (From admission, onward)   Start     Dose/Rate Route Frequency Ordered Stop   12/17/20 0915  cefTRIAXone (ROCEPHIN) 1 g in sodium chloride 0.9 % 100 mL IVPB        1 g 200 mL/hr over 30 Minutes Intravenous Every 24  hours 12/17/20 [id number]          Subjective: Seen and examined at bedside and he continues to have very limited interaction and continues have contracture.  Resting again.  In no acute distress but overnight he did spike a temperature he did not have any cough or shortness of breath.  Nursing reported that he was pocketing his food and pills and not swallowing.  He has been pancultured and procalcitonin level has been negative.  Chest x-ray shows possible focus of pneumonia but could be related to urinary tract infection.-Start empiric IV ceftriaxone and following cultures.  Objective: Vitals:   12/16/20 2026 12/16/20 2321 12/17/20 0312 12/17/20 0738  BP: (!) 151/64 (!) 150/73 (!) 127/55 (!) 172/82  Pulse: 91 89 88 87  Resp: 18 16 18 18   Temp: 100.3 F (37.9 C) (!) 100.9 F (38.3 C) 98.9  F (37.2 C) 98.3 F (36.8 C)  TempSrc: Oral Oral Oral Oral  SpO2: 100% 99% 98% 97%  Weight:      Height:        Intake/Output Summary (Last 24 hours) at 12/17/2020 [id number] Last data filed at 12/17/2020 0600 Gross per 24 hour  Intake 1680.94 ml  Output 1600 ml  Net 80.94 ml   Filed Weights   12/04/20 2310 12/04/20 2312  Weight: 58.3 kg 58.3 kg   Examination: Physical Exam:  Constitutional: The patient is a thin chronically ill-appearing African-American male who is calm and in no acute distress and does not really interact again Eyes: Lids and conjunctivae normal, sclerae anicteric  ENMT: External Ears, Nose appear normal. Grossly normal hearing.  Neck: Appears normal, supple, no cervical masses, normal ROM, no appreciable thyromegaly; no JVD Respiratory: Diminished to auscultation bilaterally with coarse breath sounds slightly worse on the right compared to left but no appreciable rales, rhonchi or crackles. Normal respiratory effort and patient is not tachypenic. No accessory muscle use.  Unlabored breathing Cardiovascular: RRR, no murmurs / rubs / gallops. S1 and S2 auscultated.  Trace pedal  edema Abdomen: Soft, non-tender, non-distended. Bowel sounds positive.  GU: Deferred. Musculoskeletal: No clubbing / cyanosis of digits/nails. No joint deformity upper and lower extremities.  Skin: No rashes, lesions, ulcers on limited skin evaluation and I did not turn MW is coccyx pressure injury. No induration; Warm and dry.  Neurologic: CN 2-12 grossly intact with no focal deficits. Romberg sign and cerebellar reflexes not assessed.  Psychiatric: Impaired judgment and insight.  He is awake but not fully alert and oriented x 3. Normal mood and appropriate affect.   Data Reviewed: I have personally reviewed following labs and imaging studies  CBC: Recent Labs  Lab 12/11/20 0319 12/15/20 0244 12/16/20 0305 12/17/20 0250  WBC 5.7 5.8 6.9 9.6  NEUTROABS  --   --  4.3 7.1  HGB 9.5* 9.7* 8.8* 8.1*  HCT 28.5* 30.8* 26.7* 26.0*  MCV 91.9 96.0 94.7 97.0  PLT 131* 241 263 [id number]   Basic Metabolic Panel: Recent Labs  Lab 12/13/20 0220 12/14/20 0417 12/15/20 0244 12/16/20 0305 12/17/20 0250  NA 136 138 136 134* 133*  K 3.5 2.9* 3.4* 4.7 4.7  CL 96* 97* 95* 96* 93*  CO2 29 30 32 31 27  GLUCOSE 77 105* 120* 111* 109*  BUN <5* <5* <5* 9 13  CREATININE 0.66 0.76 0.64 0.70 0.71  CALCIUM 8.0* 8.0* 8.2* 8.2* 8.3*  MG 1.6* 1.5* 1.3* 1.7 1.7  PHOS 2.9 3.0 1.9* 2.1* 3.6   GFR: Estimated Creatinine Clearance: 67.8 mL/min (by C-G formula based on SCr of 0.71 mg/dL). Liver Function Tests: Recent Labs  Lab 12/13/20 0220 12/14/20 0417 12/16/20 0305 12/17/20 0250  AST 18 17 17 17   ALT 11 11 14 12   ALKPHOS 41 41 46 42  BILITOT 0.5 0.5 0.4 0.6  PROT 5.0* 5.4* 5.7* 5.6*  ALBUMIN 2.1* 2.2* 2.3* 2.2*   No results for input(s): LIPASE, AMYLASE in the last 168 hours. No results for input(s): AMMONIA in the last 168 hours. Coagulation Profile: No results for input(s): INR, PROTIME in the last 168 hours. Cardiac Enzymes: No results for input(s): CKTOTAL, CKMB, CKMBINDEX, TROPONINI in the  last 168 hours. BNP (last 3 results) No results for input(s): PROBNP in the last 8760 hours. HbA1C: No results for input(s): HGBA1C in the last 72 hours. CBG: Recent Labs  Lab 12/12/20 1235 12/12/20 1645 12/12/20 2105  GLUCAP 128* 87 98   Lipid Profile: No results for input(s): CHOL, HDL, LDLCALC, TRIG, CHOLHDL, LDLDIRECT in the last 72 hours. Thyroid Function Tests: No results for input(s): TSH, T4TOTAL, FREET4, T3FREE, THYROIDAB in the last 72 hours. Anemia Panel: No results for input(s): VITAMINB12, FOLATE, FERRITIN, TIBC, IRON, RETICCTPCT in the last 72 hours. Sepsis Labs: Recent Labs  Lab 12/17/20 0250  PROCALCITON <0.10  LATICACIDVEN 1.2    No results found for this or any previous visit (from the past 240 hour(s)).   RN Pressure Injury Documentation: Pressure Injury 12/05/20 Coccyx Medial Stage 2 -  Partial thickness loss of dermis presenting as a shallow open injury with a red, pink wound bed without slough. 39mm diameter open circular abrasion, top layer skin not intact, red and blanchable, superior (Active)  12/05/20 2145  Location: Coccyx  Location Orientation: Medial  Staging: Stage 2 -  Partial thickness loss of dermis presenting as a shallow open injury with a red, pink wound bed without slough.  Wound Description (Comments): 64mm diameter open circular abrasion, top layer skin not intact, red and blanchable, superior to medial coccyx  Present on Admission: Yes    Estimated body mass index is 16.96 kg/m as calculated from the following:   Height as of this encounter: 6\' 1"  (1.854 m).   Weight as of this encounter: 58.3 kg.  Malnutrition Type:  Nutrition Problem: Inadequate oral intake Etiology: poor appetite   Malnutrition Characteristics:  Signs/Symptoms: meal completion < 50%   Nutrition Interventions:  Interventions: MVI,Boost Breeze,Prostat,Refer to RD note for recommendations   Radiology Studies: No results found.  Scheduled Meds: .  (feeding supplement) PROSource Plus  30 mL Oral BID BM  . amLODipine  2.5 mg Oral Daily  . atorvastatin  20 mg Oral QHS  . divalproex  500 mg Oral Q12H  . enoxaparin (LOVENOX) injection  40 mg Subcutaneous Daily  . feeding supplement  237 mL Oral TID BM  . FLUoxetine  40 mg Oral Daily  . lubriderm seriously sensitive   Topical Daily  . mirtazapine  30 mg Oral QHS  . multivitamin with minerals  1 tablet Oral Daily  . tamsulosin  0.4 mg Oral QHS   Continuous Infusions: . cefTRIAXone (ROCEPHIN)  IV    . dextrose 5% lactated ringers 50 mL/hr at 12/17/20 0600  . magnesium sulfate bolus IVPB      LOS: 12 days   12/19/20, DO [facility] PAGER is on AMION  If 7PM-7AM, please contact night-coverage [email or web address]

## 2020-12-18 DIAGNOSIS — B9689 Other specified bacterial agents as the cause of diseases classified elsewhere: Secondary | ICD-10-CM

## 2020-12-18 DIAGNOSIS — I10 Essential (primary) hypertension: Secondary | ICD-10-CM | POA: Diagnosis not present

## 2020-12-18 DIAGNOSIS — E785 Hyperlipidemia, unspecified: Secondary | ICD-10-CM | POA: Diagnosis not present

## 2020-12-18 DIAGNOSIS — N390 Urinary tract infection, site not specified: Secondary | ICD-10-CM

## 2020-12-18 DIAGNOSIS — R569 Unspecified convulsions: Secondary | ICD-10-CM | POA: Diagnosis not present

## 2020-12-18 DIAGNOSIS — G40919 Epilepsy, unspecified, intractable, without status epilepticus: Secondary | ICD-10-CM | POA: Diagnosis not present

## 2020-12-18 LAB — CBC WITH DIFFERENTIAL/PLATELET
Abs Immature Granulocytes: 0.04 10*3/uL (ref 0.00–0.07)
Basophils Absolute: 0 10*3/uL (ref 0.0–0.1)
Basophils Relative: 0 %
Eosinophils Absolute: 0 10*3/uL (ref 0.0–0.5)
Eosinophils Relative: 0 %
HCT: 24.1 % — ABNORMAL LOW (ref 39.0–52.0)
Hemoglobin: 8 g/dL — ABNORMAL LOW (ref 13.0–17.0)
Immature Granulocytes: 1 %
Lymphocytes Relative: 13 %
Lymphs Abs: 1.1 10*3/uL (ref 0.7–4.0)
MCH: 31.4 pg (ref 26.0–34.0)
MCHC: 33.2 g/dL (ref 30.0–36.0)
MCV: 94.5 fL (ref 80.0–100.0)
Monocytes Absolute: 0.9 10*3/uL (ref 0.1–1.0)
Monocytes Relative: 11 %
Neutro Abs: 6.1 10*3/uL (ref 1.7–7.7)
Neutrophils Relative %: 75 %
Platelets: 295 10*3/uL (ref 150–400)
RBC: 2.55 MIL/uL — ABNORMAL LOW (ref 4.22–5.81)
RDW: 12.1 % (ref 11.5–15.5)
WBC: 8.2 10*3/uL (ref 4.0–10.5)
nRBC: 0 % (ref 0.0–0.2)

## 2020-12-18 LAB — BLOOD CULTURE ID PANEL (REFLEXED) - BCID2

## 2020-12-18 LAB — COMPREHENSIVE METABOLIC PANEL
ALT: 14 U/L (ref 0–44)
AST: 25 U/L (ref 15–41)
Albumin: 2.2 g/dL — ABNORMAL LOW (ref 3.5–5.0)
Alkaline Phosphatase: 40 U/L (ref 38–126)
Anion gap: 9 (ref 5–15)
BUN: 13 mg/dL (ref 8–23)
CO2: 28 mmol/L (ref 22–32)
Calcium: 8.2 mg/dL — ABNORMAL LOW (ref 8.9–10.3)
Chloride: 95 mmol/L — ABNORMAL LOW (ref 98–111)
Creatinine, Ser: 0.76 mg/dL (ref 0.61–1.24)
GFR, Estimated: >60 mL/min (ref >60)
Glucose, Bld: 114 mg/dL — ABNORMAL HIGH (ref 70–99)
Potassium: 4 mmol/L (ref 3.5–5.1)
Sodium: 132 mmol/L — ABNORMAL LOW (ref 135–145)
Total Bilirubin: 0.8 mg/dL (ref 0.3–1.2)
Total Protein: 5.7 g/dL — ABNORMAL LOW (ref 6.5–8.1)

## 2020-12-18 LAB — PHOSPHORUS: Phosphorus: 2.8 mg/dL (ref 2.5–4.6)

## 2020-12-18 LAB — MAGNESIUM: Magnesium: 1.5 mg/dL — ABNORMAL LOW (ref 1.7–2.4)

## 2020-12-18 MED ORDER — MAGNESIUM SULFATE 50 % IJ SOLN: 3.0000 g | Freq: Once | INTRAVENOUS | Status: DC

## 2020-12-18 MED ORDER — MAGNESIUM SULFATE 2 GM/50ML IV SOLN
2.0000 g | Freq: Once | INTRAVENOUS | Status: AC
  Administered 2020-12-18: 2 g via INTRAVENOUS
  Filled 2020-12-18: qty 50

## 2020-12-18 NOTE — TOC Progression Note (Signed)
Transition of Care [facility]) - Progression Note    Patient Details  Name: Brad [name] MRN: [id number] Date of Birth: 10-May-1946  Transition [facility] CM/SW Contact  Carley Hammed, [place] Phone Number: 12/18/2020, 11:03 AM  Clinical Narrative:    CSW spoke with pt's sister [name] to give bed offers. She chose [facility] and noted that she would like her other brother moved from [place] over to [facility]. CSW noted she could not assist with the other brother. CSW followed up with facility who confirmed bed and will touch base with pt's sister. CSW will start insurance auth and continue to follow for transition of care.   Expected Discharge Plan: Hospice Medical Facility Barriers to Discharge: Hospice Bed not available  Expected Discharge Plan and Services Expected Discharge Plan: Hospice Medical Facility In-house Referral: Clinical Social Work Discharge Planning Services: CM Consult   Living arrangements for the past 2 months: Single Family Home                                       Social Determinants of Health (SDOH) Interventions    Readmission Risk Interventions No flowsheet data found.

## 2020-12-18 NOTE — Progress Notes (Signed)
PHARMACY - PHYSICIAN COMMUNICATION CRITICAL VALUE ALERT - BLOOD CULTURE IDENTIFICATION (BCID)  Brad [name] is an 75 y.o. male   Assessment:  1/2 coag neg staph on bcid  Name of physician (or Provider) Contacted: Dr Marland Mcalpine  Current antibiotics: Ceftriaxone  Results for orders placed or performed during the hospital encounter of 12/04/20  Blood Culture ID Panel (Reflexed) (Collected: 12/17/2020  3:01 AM)  Result Value Ref Range   Enterococcus faecalis NOT DETECTED NOT DETECTED   Enterococcus Faecium NOT DETECTED NOT DETECTED   Listeria monocytogenes NOT DETECTED NOT DETECTED   Staphylococcus species DETECTED (A) NOT DETECTED   Staphylococcus aureus (BCID) NOT DETECTED NOT DETECTED   Staphylococcus epidermidis NOT DETECTED NOT DETECTED   Staphylococcus lugdunensis NOT DETECTED NOT DETECTED   Streptococcus species NOT DETECTED NOT DETECTED   Streptococcus agalactiae NOT DETECTED NOT DETECTED   Streptococcus pneumoniae NOT DETECTED NOT DETECTED   Streptococcus pyogenes NOT DETECTED NOT DETECTED   A.calcoaceticus-baumannii NOT DETECTED NOT DETECTED   Bacteroides fragilis NOT DETECTED NOT DETECTED   Enterobacterales NOT DETECTED NOT DETECTED   Enterobacter cloacae complex NOT DETECTED NOT DETECTED   Escherichia coli NOT DETECTED NOT DETECTED   Klebsiella aerogenes NOT DETECTED NOT DETECTED   Klebsiella oxytoca NOT DETECTED NOT DETECTED   Klebsiella pneumoniae NOT DETECTED NOT DETECTED   Proteus species NOT DETECTED NOT DETECTED   Salmonella species NOT DETECTED NOT DETECTED   Serratia marcescens NOT DETECTED NOT DETECTED   Haemophilus influenzae NOT DETECTED NOT DETECTED   Neisseria meningitidis NOT DETECTED NOT DETECTED   Pseudomonas aeruginosa NOT DETECTED NOT DETECTED   Stenotrophomonas maltophilia NOT DETECTED NOT DETECTED   Candida albicans NOT DETECTED NOT DETECTED   Candida auris NOT DETECTED NOT DETECTED   Candida glabrata NOT DETECTED NOT DETECTED   Candida krusei NOT  DETECTED NOT DETECTED   Candida parapsilosis NOT DETECTED NOT DETECTED   Candida tropicalis NOT DETECTED NOT DETECTED   Cryptococcus neoformans/gattii NOT DETECTED NOT DETECTED   Changes to prescribed antibiotics recommended:  None - likely contaminant   Elmer Sow, PharmD, BCPS, BCCCP Clinical Pharmacist [phone]  Please check AMION for all [facility] numbers  12/18/2020 10:12 AM

## 2020-12-18 NOTE — Progress Notes (Addendum)
PROGRESS NOTE    Brad [name]  [id number] DOB: [birth year] DOA: 12/04/2020 PCP: [name], DO   Brief Narrative:  The patient is a 75 year old elderly African-American male with a past medical history significant for but not limited to history of prostate cancer, depression, type 2 diabetes mellitus, hypertension, hyperlipidemia, history of seizure disorder, history of substance abuse as well as other comorbidities who presented to the emergency room with altered mental status.  Upon arrival to the ED he is noted to be hypertensive with a systolic blood pressure [id number] but the rest of his vital signs were stable.  In the ED his white blood cell count was 9.9 and blood glucose was 75.  Any having a head CT given his altered mental status and it showed no acute intracranial abnormality.  CTA of the head neck was done as well.  He had a normal CT perfusion study and an unchanged 60% stenosis of the proximal left internal carotid artery secondary to mixed density atherosclerosis.  He is seen by neurology after speak with family the patient's presentation was felt to be secondary to seizure activity at home.  He is admitted for further evaluation and management.  Was given IV valproic acid and is noted the patient has been bedbound since 2018.  During his hospital course he had poor p.o. intake and palliative care was consulted.  Patient daughter made her father DNR and it was noted that the patient's living will documents that he does not want artificial means of feeding him.  Initially after palliative care discussions family was inclined towards comfort care but then changed her mind and wanted aggressive care.  Patient will be going to a skilled nursing facility once bed is available and insurance authorization is obtained. Electrolytes are being replete.   12/17/20: Overnight he spiked a temperature of 100.9. He was pan-Cultured and worked up and UA indicitive of UTI. CXR this AM done and showed  "Small focus of opacity in the central right upper lobe, most likely atelectasis. Consider a focus of pneumonia if there are consistent clinical findings. No other evidence of acute cardiopulmonary disease." Will start Empiric IV Ceftriaxone. Will also get SLP again to evaluate for aspiration given that he was pocketing his food and medications not swallowing.   12/18/2020: Fever has improved and his blood count is stable. Urine culture is growing out greater than 100,000 colony-forming units of Klebsiella and awaiting Sensitivities.   Assessment & Plan:   Principal Problem:   Seizure ([place]) Active Problems:   Hypertension   Hyperlipidemia   Diabetes ([place])   Breakthrough seizure ([place])   Pressure injury of skin  Breakthrough Seizures -Likely secondary to medication noncompliance.  Has a history of underlying advanced dementia. -CT head and CTA head and neck: Negative for acute findings. -Valproic acid level: Subtherapeutic on admission.  EEG positive for seizures. -Patient was given loading dose of IV valproate 600 mg in the ED.    -Continue IV Ativan 2 mg as needed for seizures like activity. -Continue Depacon 500 mg twice daily-changed  IV to p.o. -Repeat valproic acid level: Therapeutic.  Repeat EEG on 12/25: Negative for seizures.  Appreciate neurology's recommendations. -On seizure precautions -Consult PT/OT recommend SNF if able to participate, if not then he needs LTAC. -PT/OT signed off thinking that patient was going to hospice but will reconsult them and they are recommending SNF now -We will consult TOC for assistance with placement and skilled nursing facility and workup has initiated and pending;  social work states that patient has a bed available at [facility] and insurance authorization has been initiated  Poor Oral Intake -12/26: Start taking PO however he has inadequate oral intake related to poor appetite. -Continue to assist patient with meals -Appreciate dietitian's help and  now we have discontinued his boost breeze and will continue Magic cup 3 times daily with meals, Ensure Enlive p.o. 3 times daily, 30 mL of Prosource plus p.o. twice daily, and multivitamin with minerals daily -Continue with Dysphagia 1 Diet -Repeat SLP evaluation given that he was noted to be pocketing his food and not swallowing is good to wax and wane. He was able to eat with speech therapy at bedside today and there continue to recommend dysphagia 1 diet with pure and thin liquids and recommending avoiding mixing textures  Fever from Klebsiella UTI -Recurrent. Had a Temp of 100.9 yesterday AM and 100.5 yesterday afternoon -LA was 1.2 and PCT was <0.10 -Has no Leukocytosis -Pan-Cx and pending blood cultures x2, urinalysis and urine culture as well as chest x-ray -Urinalysis showed a hazy appearance with yellow color urine, negative glucose, small hemoglobin, moderate leukocytes, negative nitrite, rare bacteria, 11-20 RBCs per high-power field and greater than 50 WBCs noted on urinalysis -Urine culture still pending as well as blood cultures; urine culture is growing out greater than 100,000 coliform units of Klebsiella; blood cultures 1 out of 4 cells likely contaminant -Chest x-ray showed "Small focus of opacity in the central right upper lobe, most likely atelectasis. Consider a focus of pneumonia if there are consistent clinical findings. No other evidence of acute cardiopulmonary disease." -We will empirically start IV ceftriaxone and may need azithromycin or escalation to Unasyn for possible aspiration -Continue to monitor temperature curve and WBC; now he is afebrile and white blood cell count 8.2 -Repeat CBC in a.m. and continue with supportive care and antipyretics  Hypertension -Blood pressure is Stable.   -Continue Amlodipine 2.5 mg p.o. daily.   -Continue to monitor blood pressure closely.   -BP was 144/70 -Continue IV Hydralazine as needed if SBP more than  170.  Hyperlipidemia -Continue Atorvastatin 20 mg po qHS  Hypokalemia -Patient's K+ is now 4.0 today -Mag Level was 1.3 and improved to 1.7 and will replete again -Continue to Monitor and Replete as Necessary -Repeat CMP in the AM   Hypomagnesemia -Mag Level is now 1.5 -Replete with IV Mag Sulfate 2 grams x2  -Continue to Monitor and Replete as Necessary -Repeat Mag Level in the AM   Hypophosphatemia -Patient's Phos Level is now 2.8 -Continue to monitor and replete as necessary -Repeat phosphorus level in the a.m.  Hyponatremia -Mild as Patient's Sodium went from 136 -> 134 -> 133 and today is 132 -Replete as above -Continue to Monitor and repeat CMP in AM   Normocytic Anemia -Baseline hemoglobin between 10-11.     -Hemoglobin/hematocrit went from 9.7/30.8 -> 8.8/26.7 -> 8.1/26.0 and is stable at 8.0/24.1 -Continue to monitor H&H closely and transfuse as needed. -Continue to monitor for signs and symptoms of bleeding -Repeat CBC in the a.m.  Thrombocytopenia -Improved.   -Patient platelet count went from 131 -> 241 -> 263 -> 301 and is now 295 -Continue to monitor for signs and symptoms of bleeding; currently no overt bleeding noted-repeat CBC in a.m.  Severe protein calorie malnutrition -Estimated body mass index is 16.96 kg/m as calculated from the following:   Height as of this encounter: 6\' 1"  (1.854 m).   Weight as of this encounter:  58.3 kg. -Albumin-2.2.  Consulted dietitian-continue multivitamin and feeding supplement as per dietitian's recommendations as above.   -Monitor for signs for refeeding syndrome -Replace electrolytes as necessary.   Laurey Arrow SLP evaluation given that he is pocketing his pills and food; SLP evaluated schedule I pure with thin liquids and recommending medicated administration with crushed with pure and they are recommending to avoid mixed textures including solids and liquids at the same time; SLP recommends that generally this  patient's tolerance of p.o. will wax and wane as he has a multifactorial variables that will increase his risk for aspiration such as feeding methods, positioning and attention. -We will also initiate calorie count and reconsult Dietary   History of Substance Abuse  -UDS negative this time.   Pressure Injury Pressure Injury 12/05/20 Coccyx Medial Stage 2 -  Partial thickness loss of dermis presenting as a shallow open injury with a red, pink wound bed without slough. 53mm diameter open circular abrasion, top layer skin not intact, red and blanchable, superior (Active)  12/05/20 2145  Location: Coccyx  Location Orientation: Medial  Staging: Stage 2 -  Partial thickness loss of dermis presenting as a shallow open injury with a red, pink wound bed without slough.  Wound Description (Comments): 75mm diameter open circular abrasion, top layer skin not intact, red and blanchable, superior to medial coccyx  Present on Admission: Yes  -C/w Wound care  Depression -C/w Fluoxetine 40 mg po Daily and Mirtazapine 30 mg po qHS and Temazepam 7.5 mg po qHS  Goals of care -Patient is DNR/DNI as per his living well.  It has also been noted in patient's living will documents that he would not want artificial means of feeding. -Palliative was on board-initially patient sister (who is also HCPOA) was inclined towards comfort care however she changed her mind and became very rude with palliative NP & Dr. Doristine Bosworth & wanted to start tube feedings which was against patient's living will.  -Neurology was consulted again & we agreed with short term tube feedings (10-14 days) however patient improved and started taking p.o. following day. Tube placement/feedings never started.  Will initiate calorie count to reassess -Palliative care is not following patient anymore.  Hospice consult was discontinued ad plan is to go to SNF now that work-up is initiated insurance authorization is pending.  DVT prophylaxis: Enoxaparin 40  mg sq Daily Code Status: DO NOT RESUSCITATE  Family Communication: No family present at bedside  Disposition Plan: Pending discharge to SNF next 24 to 48 hours when bed is available and insurance authorization is obtained   Status is: Inpatient  Remains inpatient appropriate because:Unsafe d/c plan, IV treatments appropriate due to intensity of illness or inability to take PO and Inpatient level of care appropriate due to severity of illness   Dispo: The patient is from: Home              Anticipated d/c is to: SNF              Anticipated d/c date is: 1-2 days              Patient currently is not medically stable to d/c.  Consultants:   Neurology  Palliative Care Medicine    Procedures: CT Head  EEG IMPRESSION: This study showed evidence of epileptogenicity arising left temporoparietal region as well as mild diffuse encephalopathy, nonspecific etiology. No seizures were seen throughout the recording  Antimicrobials:  Anti-infectives (From admission, onward)   Start     Dose/Rate  Route Frequency Ordered Stop   12/17/20 0900  cefTRIAXone (ROCEPHIN) 1 g in sodium chloride 0.9 % 100 mL IVPB        1 g 200 mL/hr over 30 Minutes Intravenous Daily 12/17/20 0823          Subjective: Seen and examined at bedside and he was a little bit more awake today but continues to have very limited interaction and continues to have contractures. Resting comfortably and able to tell me that he is not in pain. No family currently at bedside. SLP fed the patient and continues recommend pure dysphagia 1 diet with thin liquids.  Objective: Vitals:   12/18/20 0025 12/18/20 0336 12/18/20 0810 12/18/20 1158  BP: (!) 122/51 136/63 (!) 150/66 (!) 144/72  Pulse: 81 86 83 78  Resp: 17 18 18 18   Temp: 98.4 F (36.9 C) 98.3 F (36.8 C) 98 F (36.7 C) 98 F (36.7 C)  TempSrc: Oral Oral    SpO2: 100% 100%  100%  Weight:      Height:        Intake/Output Summary (Last 24 hours) at 12/18/2020  1259 Last data filed at 12/18/2020 0030 Gross per 24 hour  Intake 250 ml  Output 400 ml  Net -150 ml   Filed Weights   12/04/20 2310 12/04/20 2312  Weight: 58.3 kg 58.3 kg   Examination: Physical Exam:  Constitutional: The patient is a thin chronically ill-appearing African-American male who is currently in no acute distress and does not really interact again Eyes: Lids and conjunctivae normal, sclerae anicteric  ENMT: External Ears, Nose appear normal. Grossly normal hearing.  Neck: Appears normal, supple, no cervical masses, normal ROM, no appreciable thyromegaly: JVD Respiratory: Diminished to auscultation bilaterally with coarse breath sounds slightly worse on the right compared to left but no appreciable wheezing, rales, rhonchi., Patient is not tachypneic or using any accessory muscles of breathing has unlabored breathing Cardiovascular: RRR, no murmurs / rubs / gallops. S1 and S2 auscultated. Minimal pedal edema Abdomen: Soft, non-tender, non-distended. Bowel sounds positive.  GU: Deferred. Musculoskeletal: No clubbing / cyanosis of digits/nails. Has mild contractures Skin: No rashes, lesions, ulcers on limited skin evaluation and I did not turn him to view the coccyx pressure injury. No induration; Warm and dry.  Neurologic: Did not really follow commands but he is awake and alert and cranial nerves II through XII grossly intact Psychiatric: Slightly impaired judgment and insight. He is awake but not fully alert and oriented x 3. Normal mood and appropriate affect.   Data Reviewed: I have personally reviewed following labs and imaging studies  CBC: Recent Labs  Lab 12/15/20 0244 12/16/20 0305 12/17/20 0250 12/18/20 0226  WBC 5.8 6.9 9.6 8.2  NEUTROABS  --  4.3 7.1 6.1  HGB 9.7* 8.8* 8.1* 8.0*  HCT 30.8* 26.7* 26.0* 24.1*  MCV 96.0 94.7 97.0 94.5  PLT 241 263 301 [id number]   Basic Metabolic Panel: Recent Labs  Lab 12/14/20 0417 12/15/20 0244 12/16/20 0305 12/17/20 0250  12/18/20 0226  NA 138 136 134* 133* 132*  K 2.9* 3.4* 4.7 4.7 4.0  CL 97* 95* 96* 93* 95*  CO2 30 32 31 27 28   GLUCOSE 105* 120* 111* 109* 114*  BUN <5* <5* 9 13 13   CREATININE 0.76 0.64 0.70 0.71 0.76  CALCIUM 8.0* 8.2* 8.2* 8.3* 8.2*  MG 1.5* 1.3* 1.7 1.7 1.5*  PHOS 3.0 1.9* 2.1* 3.6 2.8   GFR: Estimated Creatinine Clearance: 67.8 mL/min (by C-G formula  based on SCr of 0.76 mg/dL). Liver Function Tests: Recent Labs  Lab 12/13/20 0220 12/14/20 0417 12/16/20 0305 12/17/20 0250 12/18/20 0226  AST 18 17 17 17 25   ALT 11 11 14 12 14   ALKPHOS 41 41 46 42 40  BILITOT 0.5 0.5 0.4 0.6 0.8  PROT 5.0* 5.4* 5.7* 5.6* 5.7*  ALBUMIN 2.1* 2.2* 2.3* 2.2* 2.2*   No results for input(s): LIPASE, AMYLASE in the last 168 hours. No results for input(s): AMMONIA in the last 168 hours. Coagulation Profile: No results for input(s): INR, PROTIME in the last 168 hours. Cardiac Enzymes: No results for input(s): CKTOTAL, CKMB, CKMBINDEX, TROPONINI in the last 168 hours. BNP (last 3 results) No results for input(s): PROBNP in the last 8760 hours. HbA1C: No results for input(s): HGBA1C in the last 72 hours. CBG: Recent Labs  Lab 12/12/20 1235 12/12/20 1645 12/12/20 2105  GLUCAP 128* 87 98   Lipid Profile: No results for input(s): CHOL, HDL, LDLCALC, TRIG, CHOLHDL, LDLDIRECT in the last 72 hours. Thyroid Function Tests: No results for input(s): TSH, T4TOTAL, FREET4, T3FREE, THYROIDAB in the last 72 hours. Anemia Panel: No results for input(s): VITAMINB12, FOLATE, FERRITIN, TIBC, IRON, RETICCTPCT in the last 72 hours. Sepsis Labs: Recent Labs  Lab 12/17/20 0250  PROCALCITON <0.10  LATICACIDVEN 1.2    Recent Results (from the past 240 hour(s))  Culture, blood (routine x 2)     Status: None (Preliminary result)   Collection Time: 12/17/20  2:50 AM   Specimen: BLOOD  Result Value Ref Range Status   Specimen Description BLOOD RIGHT ANTECUBITAL  Final   Special Requests   Final     BOTTLES DRAWN AEROBIC AND ANAEROBIC Blood Culture adequate volume   Culture   Final    NO GROWTH 1 DAY Performed at [facility], [address], [place], [place] [address]    Report Status PENDING  Incomplete  Culture, blood (routine x 2)     Status: None (Preliminary result)   Collection Time: 12/17/20  3:01 AM   Specimen: BLOOD RIGHT ARM  Result Value Ref Range Status   Specimen Description BLOOD RIGHT ARM  Final   Special Requests AEROBIC BOTTLE ONLY Blood Culture adequate volume  Final   Culture  Setup Time   Final    GRAM POSITIVE COCCI IN CLUSTERS AEROBIC BOTTLE ONLY Organism ID to follow CRITICAL RESULT CALLED TO, READ BACK BY AND VERIFIED WITH: Ailene Rud [id number] MLM Performed at [facility], [place] [address] [place], [place] [address]    Culture GRAM POSITIVE COCCI  Final   Report Status PENDING  Incomplete  Blood Culture ID Panel (Reflexed)     Status: Abnormal   Collection Time: 12/17/20  3:01 AM  Result Value Ref Range Status   Enterococcus faecalis NOT DETECTED NOT DETECTED Final   Enterococcus Faecium NOT DETECTED NOT DETECTED Final   Listeria monocytogenes NOT DETECTED NOT DETECTED Final   Staphylococcus species DETECTED (A) NOT DETECTED Final    Comment: CRITICAL RESULT CALLED TO, READ BACK BY AND VERIFIED WITH: PHARMD M MACCIA [id number] MLM    Staphylococcus aureus (BCID) NOT DETECTED NOT DETECTED Final   Staphylococcus epidermidis NOT DETECTED NOT DETECTED Final   Staphylococcus lugdunensis NOT DETECTED NOT DETECTED Final   Streptococcus species NOT DETECTED NOT DETECTED Final   Streptococcus agalactiae NOT DETECTED NOT DETECTED Final   Streptococcus pneumoniae NOT DETECTED NOT DETECTED Final   Streptococcus pyogenes NOT DETECTED NOT DETECTED  Final   A.calcoaceticus-baumannii NOT DETECTED NOT DETECTED Final   Bacteroides fragilis NOT DETECTED NOT DETECTED Final   Enterobacterales NOT DETECTED NOT DETECTED Final   Enterobacter cloacae  complex NOT DETECTED NOT DETECTED Final   Escherichia coli NOT DETECTED NOT DETECTED Final   Klebsiella aerogenes NOT DETECTED NOT DETECTED Final   Klebsiella oxytoca NOT DETECTED NOT DETECTED Final   Klebsiella pneumoniae NOT DETECTED NOT DETECTED Final   Proteus species NOT DETECTED NOT DETECTED Final   Salmonella species NOT DETECTED NOT DETECTED Final   Serratia marcescens NOT DETECTED NOT DETECTED Final   Haemophilus influenzae NOT DETECTED NOT DETECTED Final   Neisseria meningitidis NOT DETECTED NOT DETECTED Final   Pseudomonas aeruginosa NOT DETECTED NOT DETECTED Final   Stenotrophomonas maltophilia NOT DETECTED NOT DETECTED Final   Candida albicans NOT DETECTED NOT DETECTED Final   Candida auris NOT DETECTED NOT DETECTED Final   Candida glabrata NOT DETECTED NOT DETECTED Final   Candida krusei NOT DETECTED NOT DETECTED Final   Candida parapsilosis NOT DETECTED NOT DETECTED Final   Candida tropicalis NOT DETECTED NOT DETECTED Final   Cryptococcus neoformans/gattii NOT DETECTED NOT DETECTED Final    Comment: Performed at [facility], [address], [place], [place] [address]  Culture, Urine     Status: Abnormal (Preliminary result)   Collection Time: 12/17/20  3:11 AM   Specimen: Urine, Random  Result Value Ref Range Status   Specimen Description URINE, RANDOM  Final   Special Requests NONE  Final   Culture (A)  Final    >=100,000 COLONIES/mL KLEBSIELLA PNEUMONIAE SUSCEPTIBILITIES TO FOLLOW Performed at [facility], [address] [place], [place] [address]    Report Status PENDING  Incomplete     RN Pressure Injury Documentation: Pressure Injury 12/05/20 Coccyx Medial Stage 2 -  Partial thickness loss of dermis presenting as a shallow open injury with a red, pink wound bed without slough. 88mm diameter open circular abrasion, top layer skin not intact, red and blanchable, superior (Active)  12/05/20 2145  Location: Coccyx  Location Orientation: Medial   Staging: Stage 2 -  Partial thickness loss of dermis presenting as a shallow open injury with a red, pink wound bed without slough.  Wound Description (Comments): 65mm diameter open circular abrasion, top layer skin not intact, red and blanchable, superior to medial coccyx  Present on Admission: Yes    Estimated body mass index is 16.96 kg/m as calculated from the following:   Height as of this encounter: 6\' 1"  (1.854 m).   Weight as of this encounter: 58.3 kg.  Malnutrition Type:  Nutrition Problem: Inadequate oral intake Etiology: poor appetite   Malnutrition Characteristics:  Signs/Symptoms: meal completion < 50%   Nutrition Interventions:  Interventions: MVI,Boost Breeze,Prostat,Refer to RD note for recommendations   Radiology Studies: DG CHEST PORT 1 VIEW  Result Date: 12/17/2020 CLINICAL DATA:  Code stroke.  Shortness of breath. EXAM: PORTABLE CHEST 1 VIEW COMPARISON:  05/29/2020 FINDINGS: Cardiac silhouette is normal in size. No mediastinal or hilar masses. No evidence of adenopathy. Small subtle area opacity in the right upper lobe just superior and lateral to the right hilum. Remainder of the lungs is clear. No convincing pleural effusion or pneumothorax. Skeletal structures are grossly intact. IMPRESSION: 1. Small focus of opacity in the central right upper lobe, most likely atelectasis. Consider a focus of pneumonia if there are consistent clinical findings. No other evidence of acute cardiopulmonary disease. Electronically Signed   By: 07/29/2020  Ormond M.D.   On: 12/17/2020 08:58    Scheduled Meds: . (feeding supplement) PROSource Plus  30 mL Oral BID BM  . amLODipine  2.5 mg Oral Daily  . atorvastatin  20 mg Oral QHS  . divalproex  500 mg Oral Q12H  . enoxaparin (LOVENOX) injection  40 mg Subcutaneous Daily  . feeding supplement  237 mL Oral TID BM  . FLUoxetine  40 mg Oral Daily  . lubriderm seriously sensitive   Topical Daily  . mirtazapine  30 mg Oral QHS  .  multivitamin with minerals  1 tablet Oral Daily  . tamsulosin  0.4 mg Oral QHS   Continuous Infusions: . cefTRIAXone (ROCEPHIN)  IV 1 g (12/18/20 1150)  . dextrose 5 % and 0.45% NaCl 75 mL/hr at 12/18/20 0747    LOS: 13 days   Kerney Elbe, DO [facility] PAGER is on [place]  If 7PM-7AM, please contact night-coverage [email or web address]

## 2020-12-18 NOTE — Evaluation (Signed)
Clinical/Bedside Swallow Evaluation Patient Details  Name: Brad [name] MRN: [id number] Date of Birth: [birth year]  Today's Date: 12/18/2020 Time: SLP Start Time (ACUTE ONLY): 0900 SLP Stop Time (ACUTE ONLY): 0920 SLP Time Calculation (min) (ACUTE ONLY): 20 min  Past Medical History:  Past Medical History:  Diagnosis Date  . Anemia   . Arthritis   . Cancer [facility]) 2017   prostate  . Depression   . Diabetes mellitus without complication (HCC)   . History of stomach ulcers   . Hypertension   . Seizures (HCC)   . Substance abuse [facility])    Past Surgical History:  Past Surgical History:  Procedure Laterality Date  . herniated disc    . KNEE SURGERY Right    HPI:  Pt is a 75 yo male presenting with AMS. CTH/CTA negative for acute findings; MRI pending. Per MD, there is concern for breakthrough seizure due to missed medications. Pt evaluated, progressed to puree and thin after a period of prolonged lethargy, SLP signed off. MD reordered on 12/17/20 due to temp spike and RN report of oral holding.  PMH includes: prostate ca, anemia, arthritis, dementia, depression, DM, HTN, seizures, substance abuse. Previous MBS in June 2020 revealed normal oropharyngeal swallow but with pt c/o globus sensation. Esophagram the same day revealed moderate esophageal dysmotility.   Assessment / Plan / Recommendation Clinical Impression  Pt demosntrates improved attention and arousal since last round of therapy, but oral dysaphgia persists. Pt at times needs cueing to recognize spoon or straw, but at other times takes enthusiastic automatic straw sips of thin liquids. There are no signs of aspiration with straw sips of thin liquids, but when attempting a pureed pancake with syrup pt had quite a bit of couging during prolonged oral phase. Pt managed creamy purees without any liquid teaxture mixed much better without coughing. Suspect there is some premature spillage of liquids mixed with purees. RNs also  struggling with pills. Pt has a depakote gel cap and SLP and RN attempted whole with ensure and puree and in both cases pt could not transit pill and ended up masticating the pills with the granules loose in his mouth. Overall preparing pills crushed is best. Recommend pt continue a puree/thin liquid diet, but noted on sign to avoid mixed textures.   Generally, this pts tolerance of PO will wax and wane and he has a multitude of variables that will increase his risk of aspriation such as feeding methods, positioning and attention. Pt also has a risk of post prandial aspiration given his tory of esophageal dysphagia. Other than careful precautions there is not much more to be done to facilitate intake. Recommend relying heavily on Ensures a pt drinks this well and enjoys them.  SLP Visit Diagnosis: Dysphagia, oropharyngeal phase (R13.12)    Aspiration Risk  Moderate aspiration risk    Diet Recommendation Dysphagia 1 (Puree);Thin liquid;No mixed consistencies   Liquid Administration via: Straw Medication Administration: Crushed with puree Supervision: Full supervision/cueing for compensatory strategies Compensations: Slow rate;Small sips/bites;Minimize environmental distractions;Follow solids with liquid Postural Changes: Seated upright at 90 degrees    Other  Recommendations Oral Care Recommendations: Oral care BID   Follow up Recommendations Skilled Nursing facility      Frequency and Duration min 2x/week  2 weeks       Prognosis Prognosis for Safe Diet Advancement: Good Barriers to Reach Goals: Cognitive deficits      Swallow Study   General HPI: Pt is a 75 yo male presenting  with AMS. CTH/CTA negative for acute findings; MRI pending. Per MD, there is concern for breakthrough seizure due to missed medications. Pt evaluated, progressed to puree and thin after a period of prolonged lethargy, SLP signed off. MD reordered on 12/17/20 due to temp spike and RN report of oral holding.  PMH  includes: prostate ca, anemia, arthritis, dementia, depression, DM, HTN, seizures, substance abuse. Previous MBS in June 2020 revealed normal oropharyngeal swallow but with pt c/o globus sensation. Esophagram the same day revealed moderate esophageal dysmotility. Type of Study: Bedside Swallow Evaluation Previous Swallow Assessment: see HPI Diet Prior to this Study: Dysphagia 1 (puree);Thin liquids Temperature Spikes Noted: Yes Respiratory Status: Room air History of Recent Intubation: No Behavior/Cognition: Alert;Requires cueing Oral Cavity Assessment: Dry;Dried secretions Oral Care Completed by SLP: Yes Oral Cavity - Dentition: Edentulous Self-Feeding Abilities: Total assist Patient Positioning: Upright in bed Baseline Vocal Quality: Normal Volitional Cough: Cognitively unable to elicit Volitional Swallow: Unable to elicit    Oral/Motor/Sensory Function Overall Oral Motor/Sensory Function: Other (comment) (pt does not follow OM commands, but appears strong)   Ice Chips Ice chips: Not tested (\)   Thin Liquid Thin Liquid: Within functional limits Presentation: Straw    Nectar Thick Nectar Thick Liquid: Not tested   Honey Thick Honey Thick Liquid: Not tested   Puree Puree: Impaired Presentation: Spoon Oral Phase Impairments: Reduced lingual movement/coordination Oral Phase Functional Implications: Oral holding;Prolonged oral transit Pharyngeal Phase Impairments: Throat Clearing - Immediate;Cough - Immediate   Solid     Solid: Not tested      Kali Deadwyler, Katherene Ponto 12/18/2020,9:47 AM

## 2020-12-19 DIAGNOSIS — G40919 Epilepsy, unspecified, intractable, without status epilepticus: Secondary | ICD-10-CM | POA: Diagnosis not present

## 2020-12-19 DIAGNOSIS — R569 Unspecified convulsions: Secondary | ICD-10-CM | POA: Diagnosis not present

## 2020-12-19 DIAGNOSIS — I10 Essential (primary) hypertension: Secondary | ICD-10-CM | POA: Diagnosis not present

## 2020-12-19 DIAGNOSIS — E785 Hyperlipidemia, unspecified: Secondary | ICD-10-CM | POA: Diagnosis not present

## 2020-12-19 LAB — CBC WITH DIFFERENTIAL/PLATELET
Abs Immature Granulocytes: 0.06 10*3/uL (ref 0.00–0.07)
Basophils Absolute: 0 10*3/uL (ref 0.0–0.1)
Basophils Relative: 0 %
Eosinophils Absolute: 0 10*3/uL (ref 0.0–0.5)
Eosinophils Relative: 1 %
HCT: 23.6 % — ABNORMAL LOW (ref 39.0–52.0)
Hemoglobin: 7.6 g/dL — ABNORMAL LOW (ref 13.0–17.0)
Immature Granulocytes: 1 %
Lymphocytes Relative: 18 %
Lymphs Abs: 1.6 10*3/uL (ref 0.7–4.0)
MCH: 30.4 pg (ref 26.0–34.0)
MCHC: 32.2 g/dL (ref 30.0–36.0)
MCV: 94.4 fL (ref 80.0–100.0)
Monocytes Absolute: 1.2 10*3/uL — ABNORMAL HIGH (ref 0.1–1.0)
Monocytes Relative: 13 %
Neutro Abs: 5.9 10*3/uL (ref 1.7–7.7)
Neutrophils Relative %: 67 %
Platelets: 304 10*3/uL (ref 150–400)
RBC: 2.5 MIL/uL — ABNORMAL LOW (ref 4.22–5.81)
RDW: 12 % (ref 11.5–15.5)
WBC: 8.8 10*3/uL (ref 4.0–10.5)
nRBC: 0 % (ref 0.0–0.2)

## 2020-12-19 LAB — COMPREHENSIVE METABOLIC PANEL
ALT: 16 U/L (ref 0–44)
AST: 36 U/L (ref 15–41)
Albumin: 2 g/dL — ABNORMAL LOW (ref 3.5–5.0)
Alkaline Phosphatase: 47 U/L (ref 38–126)
Anion gap: 11 (ref 5–15)
BUN: 14 mg/dL (ref 8–23)
CO2: 28 mmol/L (ref 22–32)
Calcium: 8.1 mg/dL — ABNORMAL LOW (ref 8.9–10.3)
Chloride: 93 mmol/L — ABNORMAL LOW (ref 98–111)
Creatinine, Ser: 0.69 mg/dL (ref 0.61–1.24)
GFR, Estimated: >60 mL/min (ref >60)
Glucose, Bld: 98 mg/dL (ref 70–99)
Potassium: 4.1 mmol/L (ref 3.5–5.1)
Sodium: 132 mmol/L — ABNORMAL LOW (ref 135–145)
Total Bilirubin: 0.3 mg/dL (ref 0.3–1.2)
Total Protein: 5.6 g/dL — ABNORMAL LOW (ref 6.5–8.1)

## 2020-12-19 LAB — RESP PANEL BY RT-PCR (FLU A&B, COVID) ARPGX2
Influenza A by PCR: NEGATIVE
Influenza B by PCR: NEGATIVE
SARS Coronavirus 2 by RT PCR: NEGATIVE

## 2020-12-19 LAB — URINE CULTURE: Culture: >=100000 — AB

## 2020-12-19 LAB — CULTURE, BLOOD (ROUTINE X 2): Special Requests: ADEQUATE

## 2020-12-19 LAB — GLUCOSE, CAPILLARY
Glucose-Capillary: 103 mg/dL — ABNORMAL HIGH (ref 70–99)
Glucose-Capillary: 106 mg/dL — ABNORMAL HIGH (ref 70–99)

## 2020-12-19 LAB — MAGNESIUM: Magnesium: 1.8 mg/dL (ref 1.7–2.4)

## 2020-12-19 LAB — PHOSPHORUS: Phosphorus: 2.6 mg/dL (ref 2.5–4.6)

## 2020-12-19 MED ORDER — MAGNESIUM SULFATE 2 GM/50ML IV SOLN
2.0000 g | Freq: Once | INTRAVENOUS | Status: AC
  Administered 2020-12-19: 2 g via INTRAVENOUS
  Filled 2020-12-19: qty 50

## 2020-12-19 NOTE — Plan of Care (Signed)
  Problem: Education: Goal: Knowledge of General Education information will improve Description Including pain rating scale, medication(s)/side effects and non-pharmacologic comfort measures Outcome: Progressing   

## 2020-12-19 NOTE — Progress Notes (Signed)
Brief Nutrition Note  Consult for 48-hour calorie count placed by MD at 1631 on 12/17/20.  Calorie count instructions added to nursing order: "Please hang calorie count envelope on the patient's door. Document percent consumed for each item on the patient's meal tray ticket and keep in envelope. Also document percent of any supplement or snack pt consumes and keep documentation in envelope for RD to review."  Unit RD is currently following pt with nutrition interventions in place including: - Magic Cup TID with meals - Ensure Enlive po TID - ProSource Plus 30 ml po BID - MVI with minerals - Feeding assistance  RD will follow up Monday, 12/21/19, to provide results of 48 hour calorie count.   Mertie Clause, MS, RD, LDN Inpatient Clinical Dietitian Please see [facility] for contact information.

## 2020-12-19 NOTE — Progress Notes (Signed)
PROGRESS NOTE    Brad [name]  [id number] DOB: 05-19-1946 DOA: 12/04/2020 PCP: [name], DO   Brief Narrative:  The patient is a 75 year old elderly African-American male with a past medical history significant for but not limited to history of prostate cancer, depression, type 2 diabetes mellitus, hypertension, hyperlipidemia, history of seizure disorder, history of substance abuse as well as other comorbidities who presented to the emergency room with altered mental status.  Upon arrival to the ED he is noted to be hypertensive with a systolic blood pressure [id number] but the rest of his vital signs were stable.  In the ED his white blood cell count was 9.9 and blood glucose was 75.  Any having a head CT given his altered mental status and it showed no acute intracranial abnormality.  CTA of the head neck was done as well.  He had a normal CT perfusion study and an unchanged 60% stenosis of the proximal left internal carotid artery secondary to mixed density atherosclerosis.  He is seen by neurology after speak with family the patient's presentation was felt to be secondary to seizure activity at home.  He is admitted for further evaluation and management.  Was given IV valproic acid and is noted the patient has been bedbound since 2018.  During his hospital course he had poor p.o. intake and palliative care was consulted.  Patient daughter made her father DNR and it was noted that the patient's living will documents that he does not want artificial means of feeding him.  Initially after palliative care discussions family was inclined towards comfort care but then changed her mind and wanted aggressive care.  Patient will be going to a skilled nursing facility once bed is available and insurance authorization is obtained. Electrolytes are being replete.   12/17/20: Overnight he spiked a temperature of 100.9. He was pan-Cultured and worked up and UA indicitive of UTI. CXR this AM done and showed  "Small focus of opacity in the central right upper lobe, most likely atelectasis. Consider a focus of pneumonia if there are consistent clinical findings. No other evidence of acute cardiopulmonary disease." Will start Empiric IV Ceftriaxone. Will also get SLP again to evaluate for aspiration given that he was pocketing his food and medications not swallowing.   12/18/2020: Fever has improved and his blood count is stable. Urine culture is growing out greater than 100,000 colony-forming units of Klebsiella and awaiting Sensitivities.   12/19/2020: Labs are relatively stable and Urine Sensitivities show only resistance to Ampicillin.  Continue current antibiotics for now  Assessment & Plan:   Principal Problem:   Seizure ([place]) Active Problems:   Hypertension   Hyperlipidemia   Diabetes ([place])   Breakthrough seizure ([place])   Pressure injury of skin  Breakthrough Seizures -Likely secondary to medication noncompliance.  Has a history of underlying advanced dementia. -CT head and CTA head and neck: Negative for acute findings. -Valproic acid level: Subtherapeutic on admission.  EEG positive for seizures. -Patient was given loading dose of IV valproate 600 mg in the ED.    -Continue IV Ativan 2 mg as needed for seizures like activity. -Continue Depacon 500 mg twice daily-changed  IV to p.o. -Repeat valproic acid level: Therapeutic.  Repeat EEG on 12/25: Negative for seizures.  Appreciate neurology's recommendations. -On seizure precautions -Consult PT/OT recommend SNF if able to participate, if not then he needs LTAC. -PT/OT signed off thinking that patient was going to hospice but will reconsult them and they are recommending  SNF now -We will consult TOC for assistance with placement and skilled nursing facility and workup has initiated and pending; social work states that patient has a bed available at [facility] and insurance authorization has been initiated  Poor Oral Intake -12/26: Start taking PO  however he has inadequate oral intake related to poor appetite. -Continue to assist patient with meals -Appreciate dietitian's help and now we have discontinued his boost breeze and will continue Magic cup 3 times daily with meals, Ensure Enlive p.o. 3 times daily, 30 mL of Prosource plus p.o. twice daily, and multivitamin with minerals daily -Continue with Dysphagia 1 Diet -Repeat SLP evaluation given that he was noted to be pocketing his food and not swallowing is good to wax and wane. He was able to eat with speech therapy at bedside today and there continue to recommend dysphagia 1 diet with pure and thin liquids and recommending avoiding mixing textures -He is awake but does not really interact again  Fever from Klebsiella  Pneumoniae UTI -Fevers have resolved  -LA was 1.2 and PCT was <0.10 -Has no Leukocytosis as WBC is 8.8 -Pan-Cx and pending blood cultures x2, urinalysis and urine culture as well as chest x-ray -Urinalysis showed a hazy appearance with yellow color urine, negative glucose, small hemoglobin, moderate leukocytes, negative nitrite, rare bacteria, 11-20 RBCs per high-power field and greater than 50 WBCs noted on urinalysis -Urine culture still pending as well as blood cultures; urine culture is growing out greater than 100,000 coliform units of Klebsiella; blood cultures 1 out of 4 cells likely contaminant -Chest x-ray showed "Small focus of opacity in the central right upper lobe, most likely atelectasis. Consider a focus of pneumonia if there are consistent clinical findings. No other evidence of acute cardiopulmonary disease." -Continue empiric IV ceftriaxone for now -Continue to monitor temperature curve and WBC; now he is afebrile and white blood cell count 8.8 -Repeat CBC in a.m. and continue with supportive care and antipyretics  Hypertension -Blood pressure is Stable.   -Continue Amlodipine 2.5 mg p.o. daily.   -Continue to monitor blood pressure closely.   -BP  was 130/63 -Continue IV Hydralazine as needed if SBP more than 170.  Hyperlipidemia -Continue Atorvastatin 20 mg po qHS  Hypokalemia -Patient's K+ is now 4.1 today -Mag Level was 1.8 and will replete -Continue to Monitor and Replete as Necessary -Repeat CMP in the AM   Hypomagnesemia -Mag Level is now 1.8 -Replete with IV Mag Sulfate 2 grams this AM -Continue to Monitor and Replete as Necessary -Repeat Mag Level in the AM   Hypophosphatemia -Patient's Phos Level is now 2.6 -Continue to monitor and replete as necessary -Repeat phosphorus level in the a.m.  Hyponatremia -Mild as Patient's Sodium went from 136 -> 134 -> 133 and today is 132 again  -Replete as above -Continue to Monitor and repeat CMP in AM   Normocytic Anemia -Baseline hemoglobin between 10-11.     -Hemoglobin/hematocrit went from 9.7/30.8 -> 8.8/26.7 -> 8.1/26.0 ->8.0/24.1 -> and is 7.6/23.6 today -We will check FOBT -Continue to monitor H&H closely and transfuse as needed. -Continue to monitor for signs and symptoms of bleeding -Repeat CBC in the a.m.  Thrombocytopenia -Improved.   -Patient platelet count went from 131 -> 241 -> 263 -> 301 -> 295 -> 304 -Continue to monitor for signs and symptoms of bleeding; currently no overt bleeding noted-repeat CBC in a.m.  Severe protein calorie malnutrition -Estimated body mass index is 16.96 kg/m as calculated from the following:  Height as of this encounter: 6\' 1"  (1.854 m).   Weight as of this encounter: 58.3 kg. -Albumin-2.2.  Consulted dietitian-continue multivitamin and feeding supplement as per dietitian's recommendations as above.   -Monitor for signs for refeeding syndrome -Replace electrolytes as necessary.   Laurey Arrow SLP evaluation given that he is pocketing his pills and food; SLP evaluated schedule I pure with thin liquids and recommending medicated administration with crushed with pure and they are recommending to avoid mixed textures  including solids and liquids at the same time; SLP recommends that generally this patient's tolerance of p.o. will wax and wane as he has a multifactorial variables that will increase his risk for aspiration such as feeding methods, positioning and attention. -We will also initiate calorie count and reconsult Dietary; appears to be tolerating his foods but will have dietary wearing  History of Substance Abuse  -UDS negative this time.   Pressure Injury Pressure Injury 12/05/20 Coccyx Medial Stage 2 -  Partial thickness loss of dermis presenting as a shallow open injury with a red, pink wound bed without slough. 45mm diameter open circular abrasion, top layer skin not intact, red and blanchable, superior (Active)  12/05/20 2145  Location: Coccyx  Location Orientation: Medial  Staging: Stage 2 -  Partial thickness loss of dermis presenting as a shallow open injury with a red, pink wound bed without slough.  Wound Description (Comments): 32mm diameter open circular abrasion, top layer skin not intact, red and blanchable, superior to medial coccyx  Present on Admission: Yes  -C/w Wound care  Depression -C/w Fluoxetine 40 mg po Daily and Mirtazapine 30 mg po qHS and Temazepam 7.5 mg po qHS  Goals of care -Patient is DNR/DNI as per his living well.  It has also been noted in patient's living will documents that he would not want artificial means of feeding. -Palliative was on board-initially patient sister (who is also HCPOA) was inclined towards comfort care however she changed her mind and became very rude with palliative NP & Dr. Doristine Bosworth & wanted to start tube feedings which was against patient's living will.  -Neurology was consulted again & we agreed with short term tube feedings (10-14 days) however patient improved and started taking p.o. following day. Tube placement/feedings never started.  Will initiate calorie count to reassess -Palliative care is not following patient anymore.  Hospice  consult was discontinued ad plan is to go to SNF now that work-up is initiated insurance authorization is pending.  DVT prophylaxis: Enoxaparin 40 mg sq Daily Code Status: DO NOT RESUSCITATE  Family Communication: No family present at bedside  Disposition Plan: Pending discharge to SNF next 24 to 48 hours when bed is available and insurance authorization is obtained   Status is: Inpatient  Remains inpatient appropriate because:Unsafe d/c plan, IV treatments appropriate due to intensity of illness or inability to take PO and Inpatient level of care appropriate due to severity of illness   Dispo: The patient is from: Home              Anticipated d/c is to: SNF              Anticipated d/c date is: 1-2 days              Patient currently is not medically stable to d/c.  Consultants:   Neurology  Palliative Care Medicine    Procedures: CT Head  EEG IMPRESSION: This study showed evidence of epileptogenicity arising left temporoparietal region as well as mild  diffuse encephalopathy, nonspecific etiology. No seizures were seen throughout the recording  Antimicrobials:  Anti-infectives (From admission, onward)   Start     Dose/Rate Route Frequency Ordered Stop   12/17/20 0900  cefTRIAXone (ROCEPHIN) 1 g in sodium chloride 0.9 % 100 mL IVPB        1 g 200 mL/hr over 30 Minutes Intravenous Daily 12/17/20 0823          Subjective: Seen and examined at bedside was awake but not very interactive.  His labs are improving and fairly stable.  No acute events overnight.  Did not provide a subjective history but appears calm and in no acute distress.  No other concerns or complaints at this time and no family is at bedside.  Will order Covid test in case she gets a bed for SNF in the next day or so.  Objective: Vitals:   12/18/20 2009 12/19/20 0018 12/19/20 0430 12/19/20 0737  BP: (!) 157/72 (!) 140/108 (!) 134/58 (!) (P) 150/62  Pulse: 85 77 80 (P) 74  Resp:  18 18 (P) 18  Temp: 98.8  F (37.1 C) 99.2 F (37.3 C) 98.7 F (37.1 C) (P) 98.6 F (37 C)  TempSrc: Oral Oral Oral (P) Oral  SpO2: 100% 100% 100% (P) 98%  Weight:      Height:        Intake/Output Summary (Last 24 hours) at 12/19/2020 8756 Last data filed at 12/18/2020 1200 Gross per 24 hour  Intake 60 ml  Output --  Net 60 ml   Filed Weights   12/04/20 2310 12/04/20 2312  Weight: 58.3 kg 58.3 kg   Examination: Physical Exam:  Constitutional: Patient is a thin chronically ill-appearing African-American male in no acute distress and does not really interact again but he is awake Eyes: Lids and conjunctivae normal, sclerae anicteric  ENMT: External Ears, Nose appear normal. Grossly normal hearing.  Neck: Appears normal, supple, no cervical masses, normal ROM, no appreciable thyromegaly; no appreciable JVD Respiratory: Slightly diminished to auscultation bilaterally with coarse breath sounds, no wheezing, rales, rhonchi or crackles. Normal respiratory effort and patient is not tachypenic. No accessory muscle use.  Unlabored breathing Cardiovascular: RRR, no murmurs / rubs / gallops. S1 and S2 auscultated.  Has some mild pedal edema Abdomen: Soft, non-tender, non-distended. Bowel sounds positive.  GU: Deferred. Musculoskeletal: No clubbing / cyanosis of digits/nails. No joint deformity upper and lower extremities.  Skin: No rashes, lesions, ulcers on limited skin evaluation and I did not turn to use coccyx pressure injury again. No induration; Warm and dry.  Neurologic: He does not follow commands but he is awake and alert and will look at you and track you with his eyes but not really communicate. Psychiatric: Impaired judgment and insight.  He is awake and alert but not oriented x 3. Normal mood and appropriate affect.   Data Reviewed: I have personally reviewed following labs and imaging studies  CBC: Recent Labs  Lab 12/15/20 0244 12/16/20 0305 12/17/20 0250 12/18/20 0226 12/19/20 0310  WBC 5.8  6.9 9.6 8.2 8.8  NEUTROABS  --  4.3 7.1 6.1 5.9  HGB 9.7* 8.8* 8.1* 8.0* 7.6*  HCT 30.8* 26.7* 26.0* 24.1* 23.6*  MCV 96.0 94.7 97.0 94.5 94.4  PLT 241 263 301 295 304   Basic Metabolic Panel: Recent Labs  Lab 12/15/20 0244 12/16/20 0305 12/17/20 0250 12/18/20 0226 12/19/20 0310  NA 136 134* 133* 132* 132*  K 3.4* 4.7 4.7 4.0 4.1  CL 95*  96* 93* 95* 93*  CO2 32 31 27 28 28   GLUCOSE 120* 111* 109* 114* 98  BUN <5* 9 13 13 14   CREATININE 0.64 0.70 0.71 0.76 0.69  CALCIUM 8.2* 8.2* 8.3* 8.2* 8.1*  MG 1.3* 1.7 1.7 1.5* 1.8  PHOS 1.9* 2.1* 3.6 2.8 2.6   GFR: Estimated Creatinine Clearance: 67.8 mL/min (by C-G formula based on SCr of 0.69 mg/dL). Liver Function Tests: Recent Labs  Lab 12/14/20 0417 12/16/20 0305 12/17/20 0250 12/18/20 0226 12/19/20 0310  AST 17 17 17 25  36  ALT 11 14 12 14 16   ALKPHOS 41 46 42 40 47  BILITOT 0.5 0.4 0.6 0.8 0.3  PROT 5.4* 5.7* 5.6* 5.7* 5.6*  ALBUMIN 2.2* 2.3* 2.2* 2.2* 2.0*   No results for input(s): LIPASE, AMYLASE in the last 168 hours. No results for input(s): AMMONIA in the last 168 hours. Coagulation Profile: No results for input(s): INR, PROTIME in the last 168 hours. Cardiac Enzymes: No results for input(s): CKTOTAL, CKMB, CKMBINDEX, TROPONINI in the last 168 hours. BNP (last 3 results) No results for input(s): PROBNP in the last 8760 hours. HbA1C: No results for input(s): HGBA1C in the last 72 hours. CBG: Recent Labs  Lab 12/12/20 1235 12/12/20 1645 12/12/20 2105  GLUCAP 128* 87 98   Lipid Profile: No results for input(s): CHOL, HDL, LDLCALC, TRIG, CHOLHDL, LDLDIRECT in the last 72 hours. Thyroid Function Tests: No results for input(s): TSH, T4TOTAL, FREET4, T3FREE, THYROIDAB in the last 72 hours. Anemia Panel: No results for input(s): VITAMINB12, FOLATE, FERRITIN, TIBC, IRON, RETICCTPCT in the last 72 hours. Sepsis Labs: Recent Labs  Lab 12/17/20 0250  PROCALCITON <0.10  LATICACIDVEN 1.2    Recent Results  (from the past 240 hour(s))  Culture, blood (routine x 2)     Status: None (Preliminary result)   Collection Time: 12/17/20  2:50 AM   Specimen: BLOOD  Result Value Ref Range Status   Specimen Description BLOOD RIGHT ANTECUBITAL  Final   Special Requests   Final    BOTTLES DRAWN AEROBIC AND ANAEROBIC Blood Culture adequate volume   Culture   Final    NO GROWTH 1 DAY Performed at [facility], [address] [place], [place] [address]    Report Status PENDING  Incomplete  Culture, blood (routine x 2)     Status: None (Preliminary result)   Collection Time: 12/17/20  3:01 AM   Specimen: BLOOD RIGHT ARM  Result Value Ref Range Status   Specimen Description BLOOD RIGHT ARM  Final   Special Requests AEROBIC BOTTLE ONLY Blood Culture adequate volume  Final   Culture  Setup Time   Final    GRAM POSITIVE COCCI IN CLUSTERS AEROBIC BOTTLE ONLY Organism ID to follow CRITICAL RESULT CALLED TO, READ BACK BY AND VERIFIED WITH: Ailene Rud [id number] Performed at [facility], [place] [address], [place], [place] [address]    Culture GRAM POSITIVE COCCI  Final   Report Status PENDING  Incomplete  Blood Culture ID Panel (Reflexed)     Status: Abnormal   Collection Time: 12/17/20  3:01 AM  Result Value Ref Range Status   Enterococcus faecalis NOT DETECTED NOT DETECTED Final   Enterococcus Faecium NOT DETECTED NOT DETECTED Final   Listeria monocytogenes NOT DETECTED NOT DETECTED Final   Staphylococcus species DETECTED (A) NOT DETECTED Final    Comment: CRITICAL RESULT CALLED TO, READ BACK BY AND VERIFIED WITH: [place] [id number] MLM  Staphylococcus aureus (BCID) NOT DETECTED NOT DETECTED Final   Staphylococcus epidermidis NOT DETECTED NOT DETECTED Final   Staphylococcus lugdunensis NOT DETECTED NOT DETECTED Final   Streptococcus species NOT DETECTED NOT DETECTED Final   Streptococcus agalactiae NOT DETECTED NOT DETECTED Final   Streptococcus pneumoniae NOT  DETECTED NOT DETECTED Final   Streptococcus pyogenes NOT DETECTED NOT DETECTED Final   A.calcoaceticus-baumannii NOT DETECTED NOT DETECTED Final   Bacteroides fragilis NOT DETECTED NOT DETECTED Final   Enterobacterales NOT DETECTED NOT DETECTED Final   Enterobacter cloacae complex NOT DETECTED NOT DETECTED Final   Escherichia coli NOT DETECTED NOT DETECTED Final   Klebsiella aerogenes NOT DETECTED NOT DETECTED Final   Klebsiella oxytoca NOT DETECTED NOT DETECTED Final   Klebsiella pneumoniae NOT DETECTED NOT DETECTED Final   Proteus species NOT DETECTED NOT DETECTED Final   Salmonella species NOT DETECTED NOT DETECTED Final   Serratia marcescens NOT DETECTED NOT DETECTED Final   Haemophilus influenzae NOT DETECTED NOT DETECTED Final   Neisseria meningitidis NOT DETECTED NOT DETECTED Final   Pseudomonas aeruginosa NOT DETECTED NOT DETECTED Final   Stenotrophomonas maltophilia NOT DETECTED NOT DETECTED Final   Candida albicans NOT DETECTED NOT DETECTED Final   Candida auris NOT DETECTED NOT DETECTED Final   Candida glabrata NOT DETECTED NOT DETECTED Final   Candida krusei NOT DETECTED NOT DETECTED Final   Candida parapsilosis NOT DETECTED NOT DETECTED Final   Candida tropicalis NOT DETECTED NOT DETECTED Final   Cryptococcus neoformans/gattii NOT DETECTED NOT DETECTED Final    Comment: Performed at [facility], [address] [place], [place] [address]  Culture, Urine     Status: Abnormal   Collection Time: 12/17/20  3:11 AM   Specimen: Urine, Random  Result Value Ref Range Status   Specimen Description URINE, RANDOM  Final   Special Requests   Final    NONE Performed at [facility], [place] [address] [place], [place] [address]    Culture >=100,000 COLONIES/mL KLEBSIELLA PNEUMONIAE (A)  Final   Report Status 12/19/2020 FINAL  Final   Organism ID, Bacteria KLEBSIELLA PNEUMONIAE (A)  Final      Susceptibility   Klebsiella pneumoniae - MIC*    AMPICILLIN RESISTANT  Resistant     CEFAZOLIN <=4 SENSITIVE Sensitive     CEFEPIME <=0.12 SENSITIVE Sensitive     CEFTRIAXONE <=0.25 SENSITIVE Sensitive     CIPROFLOXACIN <=0.25 SENSITIVE Sensitive     GENTAMICIN <=1 SENSITIVE Sensitive     IMIPENEM <=0.25 SENSITIVE Sensitive     NITROFURANTOIN <=16 SENSITIVE Sensitive     TRIMETH/SULFA <=20 SENSITIVE Sensitive     AMPICILLIN/SULBACTAM <=2 SENSITIVE Sensitive     PIP/TAZO <=4 SENSITIVE Sensitive     * >=100,000 COLONIES/mL KLEBSIELLA PNEUMONIAE     RN Pressure Injury Documentation: Pressure Injury 12/05/20 Coccyx Medial Stage 2 -  Partial thickness loss of dermis presenting as a shallow open injury with a red, pink wound bed without slough. 53mm diameter open circular abrasion, top layer skin not intact, red and blanchable, superior (Active)  12/05/20 2145  Location: Coccyx  Location Orientation: Medial  Staging: Stage 2 -  Partial thickness loss of dermis presenting as a shallow open injury with a red, pink wound bed without slough.  Wound Description (Comments): 73mm diameter open circular abrasion, top layer skin not intact, red and blanchable, superior to medial coccyx  Present on Admission: Yes    Estimated body mass index is 16.96 kg/m as calculated from the  following:   Height as of this encounter: 6\' 1"  (1.854 m).   Weight as of this encounter: 58.3 kg.  Malnutrition Type:  Nutrition Problem: Inadequate oral intake Etiology: poor appetite   Malnutrition Characteristics:  Signs/Symptoms: meal completion < 50%   Nutrition Interventions:  Interventions: MVI,Boost Breeze,Prostat,Refer to RD note for recommendations   Radiology Studies: DG CHEST PORT 1 VIEW  Result Date: 12/17/2020 CLINICAL DATA:  Code stroke.  Shortness of breath. EXAM: PORTABLE CHEST 1 VIEW COMPARISON:  05/29/2020 FINDINGS: Cardiac silhouette is normal in size. No mediastinal or hilar masses. No evidence of adenopathy. Small subtle area opacity in the right upper lobe  just superior and lateral to the right hilum. Remainder of the lungs is clear. No convincing pleural effusion or pneumothorax. Skeletal structures are grossly intact. IMPRESSION: 1. Small focus of opacity in the central right upper lobe, most likely atelectasis. Consider a focus of pneumonia if there are consistent clinical findings. No other evidence of acute cardiopulmonary disease. Electronically Signed   By: Lajean Manes M.D.   On: 12/17/2020 08:58    Scheduled Meds: . (feeding supplement) PROSource Plus  30 mL Oral BID BM  . amLODipine  2.5 mg Oral Daily  . atorvastatin  20 mg Oral QHS  . divalproex  500 mg Oral Q12H  . enoxaparin (LOVENOX) injection  40 mg Subcutaneous Daily  . feeding supplement  237 mL Oral TID BM  . FLUoxetine  40 mg Oral Daily  . lubriderm seriously sensitive   Topical Daily  . mirtazapine  30 mg Oral QHS  . multivitamin with minerals  1 tablet Oral Daily  . tamsulosin  0.4 mg Oral QHS   Continuous Infusions: . cefTRIAXone (ROCEPHIN)  IV 1 g (12/18/20 1150)  . dextrose 5 % and 0.45% NaCl 75 mL/hr at 12/18/20 0747  . magnesium sulfate bolus IVPB      LOS: 14 days   Kerney Elbe, DO [facility] PAGER is on [place]  If 7PM-7AM, please contact night-coverage [email or web address]

## 2020-12-20 DIAGNOSIS — R569 Unspecified convulsions: Secondary | ICD-10-CM | POA: Diagnosis not present

## 2020-12-20 DIAGNOSIS — E785 Hyperlipidemia, unspecified: Secondary | ICD-10-CM | POA: Diagnosis not present

## 2020-12-20 DIAGNOSIS — G40919 Epilepsy, unspecified, intractable, without status epilepticus: Secondary | ICD-10-CM | POA: Diagnosis not present

## 2020-12-20 DIAGNOSIS — I10 Essential (primary) hypertension: Secondary | ICD-10-CM | POA: Diagnosis not present

## 2020-12-20 LAB — COMPREHENSIVE METABOLIC PANEL
ALT: 22 U/L (ref 0–44)
AST: 47 U/L — ABNORMAL HIGH (ref 15–41)
Albumin: 2.2 g/dL — ABNORMAL LOW (ref 3.5–5.0)
Alkaline Phosphatase: 54 U/L (ref 38–126)
Anion gap: 11 (ref 5–15)
BUN: 14 mg/dL (ref 8–23)
CO2: 28 mmol/L (ref 22–32)
Calcium: 8.2 mg/dL — ABNORMAL LOW (ref 8.9–10.3)
Chloride: 93 mmol/L — ABNORMAL LOW (ref 98–111)
Creatinine, Ser: 0.75 mg/dL (ref 0.61–1.24)
GFR, Estimated: >60 mL/min (ref >60)
Glucose, Bld: 116 mg/dL — ABNORMAL HIGH (ref 70–99)
Potassium: 4.4 mmol/L (ref 3.5–5.1)
Sodium: 132 mmol/L — ABNORMAL LOW (ref 135–145)
Total Bilirubin: 0.7 mg/dL (ref 0.3–1.2)
Total Protein: 6.1 g/dL — ABNORMAL LOW (ref 6.5–8.1)

## 2020-12-20 LAB — CBC WITH DIFFERENTIAL/PLATELET
Abs Immature Granulocytes: 0.11 10*3/uL — ABNORMAL HIGH (ref 0.00–0.07)
Basophils Absolute: 0 10*3/uL (ref 0.0–0.1)
Basophils Relative: 0 %
Eosinophils Absolute: 0 10*3/uL (ref 0.0–0.5)
Eosinophils Relative: 0 %
HCT: 27.8 % — ABNORMAL LOW (ref 39.0–52.0)
Hemoglobin: 8.8 g/dL — ABNORMAL LOW (ref 13.0–17.0)
Immature Granulocytes: 1 %
Lymphocytes Relative: 13 %
Lymphs Abs: 1.2 10*3/uL (ref 0.7–4.0)
MCH: 29.9 pg (ref 26.0–34.0)
MCHC: 31.7 g/dL (ref 30.0–36.0)
MCV: 94.6 fL (ref 80.0–100.0)
Monocytes Absolute: 1.2 10*3/uL — ABNORMAL HIGH (ref 0.1–1.0)
Monocytes Relative: 13 %
Neutro Abs: 7.1 10*3/uL (ref 1.7–7.7)
Neutrophils Relative %: 73 %
Platelets: 333 10*3/uL (ref 150–400)
RBC: 2.94 MIL/uL — ABNORMAL LOW (ref 4.22–5.81)
RDW: 12.4 % (ref 11.5–15.5)
WBC: 9.7 10*3/uL (ref 4.0–10.5)
nRBC: 0 % (ref 0.0–0.2)

## 2020-12-20 LAB — MAGNESIUM: Magnesium: 1.8 mg/dL (ref 1.7–2.4)

## 2020-12-20 LAB — PHOSPHORUS: Phosphorus: 2.6 mg/dL (ref 2.5–4.6)

## 2020-12-20 MED ORDER — ENSURE ENLIVE PO LIQD
237.0000 mL | Freq: Four times a day (QID) | ORAL | Status: DC
  Administered 2020-12-20 – 2020-12-30 (×39): 237 mL via ORAL
  Filled 2020-12-20: qty 237

## 2020-12-20 MED ORDER — MAGNESIUM SULFATE 2 GM/50ML IV SOLN
2.0000 g | Freq: Once | INTRAVENOUS | Status: AC
  Administered 2020-12-20: 2 g via INTRAVENOUS
  Filled 2020-12-20: qty 50

## 2020-12-20 NOTE — TOC Progression Note (Signed)
Transition of Care [facility]) - Progression Note    Patient Details  Name: Brad [name] MRN: [id number] Date of Birth: 06/20/1946  Transition of Care [facility]) CM/SW Contact  Terrial Rhodes, LCSWA Phone Number: 12/20/2020, 12:15 PM  Clinical Narrative:     CSW called to check on patients insurance authorization. Patients insurance authorization went to a peer to peer. CSW informed MD. Deadline for peer to peer is 2 pm today. CSW informed casemanger susan about status of patient. CSW called and spoke with Olegario Messier at [facility] to let her know about insurance status.  CSW will continue to follow.    Expected Discharge Plan: Hospice Medical Facility Barriers to Discharge: Hospice Bed not available  Expected Discharge Plan and Services Expected Discharge Plan: Hospice Medical Facility In-house Referral: Clinical Social Work Discharge Planning Services: CM Consult   Living arrangements for the past 2 months: Single Family Home                                       Social Determinants of Health (SDOH) Interventions    Readmission Risk Interventions No flowsheet data found.

## 2020-12-20 NOTE — Progress Notes (Signed)
SLP Cancellation Note  Patient Details Name: KEYSHON STEIN MRN: [id number] DOB: [birth year]   Cancelled treatment:       Reason Eval/Treat Not Completed: Patient unavailable - getting cleaned by nursing staff. Will f/u as able.    Mahala Menghini., M.A. CCC-SLP Acute Rehabilitation Services Pager [phone] Office [phone]  12/20/2020, 12:09 PM

## 2020-12-20 NOTE — Plan of Care (Signed)

## 2020-12-20 NOTE — Progress Notes (Signed)
Nutrition Follow-up  DOCUMENTATION CODES:   Underweight  INTERVENTION:   Calorie count initiated.  Magic cup TID with meals, each supplement provides 290 kcal and 9 grams of protein  Ensure Enlive po QID, each supplement provides 350 kcal and 20 grams of protein  Continue 23ml Prosource Plus po BID, each supplement provides 100 kcals and 15 grams of protein  Continue MVI daily  Continue to assist pt with meals  NUTRITION DIAGNOSIS:   Inadequate oral intake related to poor appetite as evidenced by meal completion < 50%; ongoing  GOAL:   Patient will meet greater than or equal to 90% of their needs; progressing  MONITOR:   PO intake,Supplement acceptance,Diet advancement,Weight trends,Labs,I & O's  REASON FOR ASSESSMENT:   Consult Enteral/tube feeding initiation and management  ASSESSMENT:   Pt admitted with breakthrough seizures. PMH includes prostate cancer, depression, type 2 DM, HTN, HLD, seizure, substance abuse. Pt bedbound at baseline.  Calorie count ordered 12/31 per MD orders. No information/meal tickets collected at time of visit. RN to initiate calorie count today. Envelop placed on pt door for meal ticket collection. RN verbalized understanding of calorie count. RN reports meal completion 25% at breakfast this morning and pt has been consuming his nutritional supplements. RD to continue with current nutritional supplementation to aid in caloric and protein needs. RN has been encouraging pt po intake. Pt initially requested no artifical meals of feeding per goals of care, however pt daughter agreeable to tube feeding if needed. RD to follow up with calorie count results. Unable to complete Nutrition-Focused physical exam at this time. Pt busy with nursing cares at time of visit.   Labs and medications reviewed.   Diet Order:   Diet Order            DIET - DYS 1 Room service appropriate? Yes; Fluid consistency: Thin  Diet effective now                  EDUCATION NEEDS:   No education needs have been identified at this time  Skin:  Skin Assessment: Skin Integrity Issues: Skin Integrity Issues:: Stage II Stage II: coccyx  Last BM:  1/3  Height:   Ht Readings from Last 1 Encounters:  12/04/20 6\' 1"  (1.854 m)    Weight:   Wt Readings from Last 1 Encounters:  12/04/20 58.3 kg   BMI:  Body mass index is 16.96 kg/m.  Estimated Nutritional Needs:   Kcal:  1950-2150  Protein:  95-105 grams  Fluid:  >1.95L/d  12/06/20, MS, RD, LDN RD pager number/after hours weekend pager number on Amion.

## 2020-12-20 NOTE — Progress Notes (Signed)
Ok to add stop date of 5d to ceftriaxone for UTI/PNA per Dr Marland Mcalpine.  Ulyses Southward, PharmD, BCIDP, AAHIVP, CPP Infectious Disease Pharmacist 12/20/2020 10:34 AM

## 2020-12-20 NOTE — Progress Notes (Signed)
PROGRESS NOTE    Brad [name]  [id number] DOB: 1946/02/20 DOA: 12/04/2020 PCP: [name], DO   Brief Narrative:  The patient is a 75 year old elderly African-American male with a past medical history significant for but not limited to history of prostate cancer, depression, type 2 diabetes mellitus, hypertension, hyperlipidemia, history of seizure disorder, history of substance abuse as well as other comorbidities who presented to the emergency room with altered mental status.  Upon arrival to the ED he is noted to be hypertensive with a systolic blood pressure [id number] but the rest of his vital signs were stable.  In the ED his white blood cell count was 9.9 and blood glucose was 75.  Any having a head CT given his altered mental status and it showed no acute intracranial abnormality.  CTA of the head neck was done as well.  He had a normal CT perfusion study and an unchanged 60% stenosis of the proximal left internal carotid artery secondary to mixed density atherosclerosis.  He is seen by neurology after speak with family the patient's presentation was felt to be secondary to seizure activity at home.  He is admitted for further evaluation and management.  Was given IV valproic acid and is noted the patient has been bedbound since 2018.  During his hospital course he had poor p.o. intake and palliative care was consulted.  Patient daughter made her father DNR and it was noted that the patient's living will documents that he does not want artificial means of feeding him.  Initially after palliative care discussions family was inclined towards comfort care but then changed her mind and wanted aggressive care.  Patient will be going to a skilled nursing facility once bed is available and insurance authorization is obtained. Electrolytes are being replete.   12/17/20: Overnight he spiked a temperature of 100.9. He was pan-Cultured and worked up and UA indicitive of UTI. CXR this AM done and showed  "Small focus of opacity in the central right upper lobe, most likely atelectasis. Consider a focus of pneumonia if there are consistent clinical findings. No other evidence of acute cardiopulmonary disease." Will start Empiric IV Ceftriaxone. Will also get SLP again to evaluate for aspiration given that he was pocketing his food and medications not swallowing.   12/18/2020: Fever has improved and his blood count is stable. Urine culture is growing out greater than 100,000 colony-forming units of Klebsiella and awaiting Sensitivities.   12/19/2020: Labs are relatively stable and Urine Sensitivities show only resistance to Ampicillin.  Continue current antibiotics for now and will stop after 5 days  12/20/2020 Patient is resting comfortably in no acute distress.  Covid repeat testing was negative yesterday.  Dietary was evaluating his calorie intake and have made some recommendations with supplements.  His labs are fairly stable. Given his limited participation and "low rehab potential" his Authorization for SNF was denied and he will need long term care and likely will go home with [facility].   Assessment & Plan:   Principal Problem:   Seizure ([place]) Active Problems:   Hypertension   Hyperlipidemia   Diabetes ([facility])   Breakthrough seizure ([place])   Pressure injury of skin  Breakthrough Seizures -Likely secondary to medication noncompliance.  Has a history of underlying advanced dementia. -CT head and CTA head and neck: Negative for acute findings. -Valproic acid level: Subtherapeutic on admission.  EEG positive for seizures. -Patient was given loading dose of IV valproate 600 mg in the ED.    -  Continue IV Ativan 2 mg as needed for seizures like activity. -Continue Depacon 500 mg twice daily-changed  IV to p.o. -Repeat valproic acid level: Therapeutic.  Repeat EEG on 12/25: Negative for seizures.  Appreciate neurology's recommendations. -On seizure precautions -Consult PT/OT recommend SNF  if able to participate, if not then he needs Long Term Care. -PT/OT signed off thinking that patient was going to hospice but will reconsult them and they are recommending SNF if he has been able to participate -We will consult TOC for assistance with placement and skilled nursing facility and workup has initiated and pending; social work states that patient has a bed available at [facility] and insurance authorization has been initiated; His Authorization has been denied as he has not been able to participate given his current condition; Will send Home with [facility] and appreciate TOC assistance for disposition and placement.   Poor Oral Intake -12/26: Start taking PO however he has inadequate oral intake related to poor appetite. -Continue to assist patient with meals -Appreciate dietitian's help and now we have discontinued his boost breeze and will continue Magic cup 3 times daily with meals, Ensure Enlive p.o. 3 times daily, 30 mL of Prosource plus p.o. twice daily, and multivitamin with minerals daily and they also recommending feeding assistance -Continue with Dysphagia 1 Diet -Repeat SLP evaluation given that he was noted to be pocketing his food and not swallowing is good to wax and wane. He was able to eat with speech therapy at bedside today and there continue to recommend dysphagia 1 diet with pure and thin liquids and recommending avoiding mixing textures -He wanted to sleep today and does not really interact again  Fever from Klebsiella  Pneumoniae UTI -Fevers have resolved  -LA was 1.2 and PCT was <0.10 -Has no Leukocytosis as WBC is 9.7 today -Pan-Cx and pending blood cultures x2, urinalysis and urine culture as well as chest x-ray -Urinalysis showed a hazy appearance with yellow color urine, negative glucose, small hemoglobin, moderate leukocytes, negative nitrite, rare bacteria, 11-20 RBCs per high-power field and greater than 50 WBCs noted on urinalysis -Urine culture  still pending as well as blood cultures; urine culture is growing out greater than 100,000 coliform units of Klebsiella; blood cultures 1 out of 4 cells likely contaminant -Chest x-ray showed "Small focus of opacity in the central right upper lobe, most likely atelectasis. Consider a focus of pneumonia if there are consistent clinical findings. No other evidence of acute cardiopulmonary disease." -Continue empiric IV ceftriaxone for now and stop after 5 days total -Continue to monitor temperature curve and WBC; now he is afebrile and white blood cell count 9.7 today -Repeat CBC in a.m. and continue with supportive care and antipyretics  Hypertension -Blood pressure is Stable.   -Continue Amlodipine 2.5 mg p.o. daily.   -Continue to monitor blood pressure closely.   -BP was 115/73 -Continue IV Hydralazine as needed if SBP more than 170.  Hyperlipidemia -Continue Atorvastatin 20 mg po qHS  Hypokalemia -Patient's K+ is now 4.4 today -Mag Level was 1.8 and will replete -Continue to Monitor and Replete as Necessary -Repeat CMP in the AM   Hypomagnesemia -Mag Level is now 1.8 -Replete with IV Mag Sulfate 2 grams again -Continue to Monitor and Replete as Necessary -Repeat Mag Level in the AM   Hypophosphatemia -Patient's Phos Level is now 2.6 -Continue to monitor and replete as necessary -Repeat phosphorus level in the a.m.  Hyponatremia -Mild as Patient's Sodium went from 136 ->  134 -> 133 and today is 132 again and this is the third day in a row -Replete as above -Continue to Monitor and repeat CMP in AM   Normocytic Anemia -Baseline hemoglobin between 10-11.     -Hemoglobin/hematocrit went from 9.7/30.8 -> 8.8/26.7 -> 8.1/26.0 ->8.0/24.1 -> 7.6/23.6 and today it is 8.8/27.8 -We will check FOBT currently no overt signs and symptoms of bleeding -Continue to monitor H&H closely and transfuse as needed. -Continue to monitor for signs and symptoms of bleeding -Repeat CBC in  the a.m.  Thrombocytopenia -Improved.   -Patient platelet count went from 131 -> 241 -> 263 -> 301 -> 295 -> 304 and today is 333 -Continue to monitor for signs and symptoms of bleeding; currently no overt bleeding noted-repeat CBC in a.m.  Severe protein calorie malnutrition/Underweight -Estimated body mass index is 16.96 kg/m as calculated from the following:   Height as of this encounter: 6\' 1"  (1.854 m).   Weight as of this encounter: 58.3 kg. -Albumin-2.2.  Consulted dietitian-continue multivitamin and feeding supplement as per dietitian's recommendations as above.   -Monitor for signs for refeeding syndrome -Replace electrolytes as necessary.   Laurey Arrow SLP evaluation given that he is pocketing his pills and food; SLP evaluated schedule I pure with thin liquids and recommending medicated administration with crushed with pure and they are recommending to avoid mixed textures including solids and liquids at the same time; SLP recommends that generally this patient's tolerance of p.o. will wax and wane as he has a multifactorial variables that will increase his risk for aspiration such as feeding methods, positioning and attention. -Caloric count results should be available today.  History of Substance Abuse  -UDS negative this time.   Pressure Injury Pressure Injury 12/05/20 Coccyx Medial Stage 2 -  Partial thickness loss of dermis presenting as a shallow open injury with a red, pink wound bed without slough. 29mm diameter open circular abrasion, top layer skin not intact, red and blanchable, superior (Active)  12/05/20 2145  Location: Coccyx  Location Orientation: Medial  Staging: Stage 2 -  Partial thickness loss of dermis presenting as a shallow open injury with a red, pink wound bed without slough.  Wound Description (Comments): 38mm diameter open circular abrasion, top layer skin not intact, red and blanchable, superior to medial coccyx  Present on Admission: Yes  -C/w Wound  care  Depression -C/w Fluoxetine 40 mg po Daily and Mirtazapine 30 mg po qHS and Temazepam 7.5 mg po qHS  Goals of care -Patient is DNR/DNI as per his living well.  It has also been noted in patient's living will documents that he would not want artificial means of feeding. -Palliative was on board-initially patient sister (who is also HCPOA) was inclined towards comfort care however she changed her mind and became very rude with palliative NP & Dr. Doristine Bosworth & wanted to start tube feedings which was against patient's living will.  -Neurology was consulted again & we agreed with short term tube feedings (10-14 days) however patient improved and started taking p.o. following day. Tube placement/feedings never started.  Will initiate calorie count to reassess -Palliative care is not following patient anymore.  Hospice consult was discontinued ad plan is to go to SNF now that work-up is initiated insurance authorization is pending.  DVT prophylaxis: Enoxaparin 40 mg sq Daily Code Status: DO NOT RESUSCITATE  Family Communication: No family present at bedside  Disposition Plan: Pending discharge to SNF next 24 to 48 hours when bed is available  and insurance authorization is obtained however is Authorization was denied given his "low rehab potential" and limited ability to participate; PT is recommending long-term care if he is unable to participate  Status is: Inpatient  Remains inpatient appropriate because:Unsafe d/c plan, IV treatments appropriate due to intensity of illness or inability to take PO and Inpatient level of care appropriate due to severity of illness   Dispo: The patient is from: Home              Anticipated d/c is to: Home with [facility] now that he has been denied for SNF              Anticipated d/c date is: 1-2 days              Patient currently is not medically stable to d/c.  Consultants:   Neurology  Palliative Care Medicine    Procedures: CT  Head  EEG IMPRESSION: This study showed evidence of epileptogenicity arising left temporoparietal region as well as mild diffuse encephalopathy, nonspecific etiology. No seizures were seen throughout the recording  Antimicrobials:  Anti-infectives (From admission, onward)   Start     Dose/Rate Route Frequency Ordered Stop   12/17/20 0900  cefTRIAXone (ROCEPHIN) 1 g in sodium chloride 0.9 % 100 mL IVPB        1 g 200 mL/hr over 30 Minutes Intravenous Daily 12/17/20 0823 12/22/20 0959        Subjective: Seen and examined at bedside he was not as interactive today and not awake.  He continues to have contractures and briefly opens his eyes to painful stimuli but then went back to sleep.  His insurance authorization has been denied given his low rehab potential and will go home with home health services as he is going to need long-term care at discharge.  No acute changes overnight and reports no new changes today.  Objective: Vitals:   12/20/20 0043 12/20/20 0456 12/20/20 0807 12/20/20 1155  BP: (!) 155/70 (!) 156/74 134/66 115/73  Pulse: 92 82 73 68  Resp: 18 18 17 18   Temp: 99 F (37.2 C) 98 F (36.7 C) 98 F (36.7 C) 99 F (37.2 C)  TempSrc: Oral Oral Oral Oral  SpO2: 100% 100% 100% 100%  Weight:      Height:        Intake/Output Summary (Last 24 hours) at 12/20/2020 1159 Last data filed at 12/20/2020 0700 Gross per 24 hour  Intake 360 ml  Output 2100 ml  Net -1740 ml   Filed Weights   12/04/20 2310 12/04/20 2312  Weight: 58.3 kg 58.3 kg   Examination: Physical Exam:  Constitutional: Patient is a thin chronically ill-appearing African-American male currently in no acute distress and does not really interact again and was more sleepy today.  He opens his eyes to painful stimuli but goes back to sleep. Eyes: Lids and conjunctivae normal, sclerae anicteric  ENMT: External Ears, Nose appear normal. Grossly normal hearing.  Neck: Appears normal, supple, no cervical masses,  normal ROM, no appreciable thyromegaly; no JVD Respiratory: Mildly diminished to auscultation bilaterally with coarse breath sounds, no wheezing, rales, rhonchi or crackles. Normal respiratory effort and patient is not tachypenic. No accessory muscle use.  Unlabored breathing Cardiovascular: RRR, no murmurs / rubs / gallops. S1 and S2 auscultated.  Has some mild pedal edema Abdomen: Soft, non-tender, non-distended. Bowel sounds positive.  GU: Deferred.  Has a condom catheter in place Musculoskeletal: No clubbing / cyanosis of  digits/nails.  Has some contractures noted. Skin: No appreciable rashes or lesions but I do not turn him to see his coccyx pressure injury.. No induration; Warm and dry.  Neurologic: He does not follow commands and does not interact and was somnolent and briefly open his eyes for painful stimuli and looked at me but then went back to sleep. Psychiatric: Impaired judgment and insight.  He is not alert and oriented x 3 and appears little bit more somnolent today.   Data Reviewed: I have personally reviewed following labs and imaging studies  CBC: Recent Labs  Lab 12/16/20 0305 12/17/20 0250 12/18/20 0226 12/19/20 0310 12/20/20 0129  WBC 6.9 9.6 8.2 8.8 9.7  NEUTROABS 4.3 7.1 6.1 5.9 7.1  HGB 8.8* 8.1* 8.0* 7.6* 8.8*  HCT 26.7* 26.0* 24.1* 23.6* 27.8*  MCV 94.7 97.0 94.5 94.4 94.6  PLT 263 301 295 304 0000000   Basic Metabolic Panel: Recent Labs  Lab 12/16/20 0305 12/17/20 0250 12/18/20 0226 12/19/20 0310 12/20/20 0129  NA 134* 133* 132* 132* 132*  K 4.7 4.7 4.0 4.1 4.4  CL 96* 93* 95* 93* 93*  CO2 31 27 28 28 28   GLUCOSE 111* 109* 114* 98 116*  BUN 9 13 13 14 14   CREATININE 0.70 0.71 0.76 0.69 0.75  CALCIUM 8.2* 8.3* 8.2* 8.1* 8.2*  MG 1.7 1.7 1.5* 1.8 1.8  PHOS 2.1* 3.6 2.8 2.6 2.6   GFR: Estimated Creatinine Clearance: 67.8 mL/min (by C-G formula based on SCr of 0.75 mg/dL). Liver Function Tests: Recent Labs  Lab 12/16/20 0305 12/17/20 0250  12/18/20 0226 12/19/20 0310 12/20/20 0129  AST 17 17 25  36 47*  ALT 14 12 14 16 22   ALKPHOS 46 42 40 47 54  BILITOT 0.4 0.6 0.8 0.3 0.7  PROT 5.7* 5.6* 5.7* 5.6* 6.1*  ALBUMIN 2.3* 2.2* 2.2* 2.0* 2.2*   No results for input(s): LIPASE, AMYLASE in the last 168 hours. No results for input(s): AMMONIA in the last 168 hours. Coagulation Profile: No results for input(s): INR, PROTIME in the last 168 hours. Cardiac Enzymes: No results for input(s): CKTOTAL, CKMB, CKMBINDEX, TROPONINI in the last 168 hours. BNP (last 3 results) No results for input(s): PROBNP in the last 8760 hours. HbA1C: No results for input(s): HGBA1C in the last 72 hours. CBG: Recent Labs  Lab 12/19/20 1238 12/19/20 1639  GLUCAP 103* 106*   Lipid Profile: No results for input(s): CHOL, HDL, LDLCALC, TRIG, CHOLHDL, LDLDIRECT in the last 72 hours. Thyroid Function Tests: No results for input(s): TSH, T4TOTAL, FREET4, T3FREE, THYROIDAB in the last 72 hours. Anemia Panel: No results for input(s): VITAMINB12, FOLATE, FERRITIN, TIBC, IRON, RETICCTPCT in the last 72 hours. Sepsis Labs: Recent Labs  Lab 12/17/20 0250  PROCALCITON <0.10  LATICACIDVEN 1.2    Recent Results (from the past 240 hour(s))  Culture, blood (routine x 2)     Status: None (Preliminary result)   Collection Time: 12/17/20  2:50 AM   Specimen: BLOOD  Result Value Ref Range Status   Specimen Description BLOOD RIGHT ANTECUBITAL  Final   Special Requests   Final    BOTTLES DRAWN AEROBIC AND ANAEROBIC Blood Culture adequate volume   Culture   Final    NO GROWTH 2 DAYS Performed at [facility], [address] [place], [place] [address]    Report Status PENDING  Incomplete  Culture, blood (routine x 2)     Status: Abnormal   Collection Time: 12/17/20  3:01 AM  Specimen: BLOOD RIGHT ARM  Result Value Ref Range Status   Specimen Description BLOOD RIGHT ARM  Final   Special Requests AEROBIC BOTTLE ONLY Blood Culture adequate volume   Final   Culture  Setup Time   Final    GRAM POSITIVE COCCI IN CLUSTERS AEROBIC BOTTLE ONLY Organism ID to follow CRITICAL RESULT CALLED TO, READ BACK BY AND VERIFIED WITH: PHARMD M MACCIA [id number] MLM    Culture (A)  Final    STAPHYLOCOCCUS CAPITIS THE SIGNIFICANCE OF ISOLATING THIS ORGANISM FROM A SINGLE SET OF BLOOD CULTURES WHEN MULTIPLE SETS ARE DRAWN IS UNCERTAIN. PLEASE NOTIFY THE MICROBIOLOGY DEPARTMENT WITHIN ONE WEEK IF SPECIATION AND SENSITIVITIES ARE REQUIRED. Performed at [facility] [place] [facility], [address], [place], [place] [address]    Report Status 12/19/2020 FINAL  Final  Blood Culture ID Panel (Reflexed)     Status: Abnormal   Collection Time: 12/17/20  3:01 AM  Result Value Ref Range Status   Enterococcus faecalis NOT DETECTED NOT DETECTED Final   Enterococcus Faecium NOT DETECTED NOT DETECTED Final   Listeria monocytogenes NOT DETECTED NOT DETECTED Final   Staphylococcus species DETECTED (A) NOT DETECTED Final    Comment: CRITICAL RESULT CALLED TO, READ BACK BY AND VERIFIED WITH: PHARMD M MACCIA [id number] MLM    Staphylococcus aureus (BCID) NOT DETECTED NOT DETECTED Final   Staphylococcus epidermidis NOT DETECTED NOT DETECTED Final   Staphylococcus lugdunensis NOT DETECTED NOT DETECTED Final   Streptococcus species NOT DETECTED NOT DETECTED Final   Streptococcus agalactiae NOT DETECTED NOT DETECTED Final   Streptococcus pneumoniae NOT DETECTED NOT DETECTED Final   Streptococcus pyogenes NOT DETECTED NOT DETECTED Final   A.calcoaceticus-baumannii NOT DETECTED NOT DETECTED Final   Bacteroides fragilis NOT DETECTED NOT DETECTED Final   Enterobacterales NOT DETECTED NOT DETECTED Final   Enterobacter cloacae complex NOT DETECTED NOT DETECTED Final   Escherichia coli NOT DETECTED NOT DETECTED Final   Klebsiella aerogenes NOT DETECTED NOT DETECTED Final   Klebsiella oxytoca NOT DETECTED NOT DETECTED Final   Klebsiella pneumoniae NOT DETECTED NOT DETECTED Final    Proteus species NOT DETECTED NOT DETECTED Final   Salmonella species NOT DETECTED NOT DETECTED Final   Serratia marcescens NOT DETECTED NOT DETECTED Final   Haemophilus influenzae NOT DETECTED NOT DETECTED Final   Neisseria meningitidis NOT DETECTED NOT DETECTED Final   Pseudomonas aeruginosa NOT DETECTED NOT DETECTED Final   Stenotrophomonas maltophilia NOT DETECTED NOT DETECTED Final   Candida albicans NOT DETECTED NOT DETECTED Final   Candida auris NOT DETECTED NOT DETECTED Final   Candida glabrata NOT DETECTED NOT DETECTED Final   Candida krusei NOT DETECTED NOT DETECTED Final   Candida parapsilosis NOT DETECTED NOT DETECTED Final   Candida tropicalis NOT DETECTED NOT DETECTED Final   Cryptococcus neoformans/gattii NOT DETECTED NOT DETECTED Final    Comment: Performed at [facility], [address], [place], [place] [address]  Culture, Urine     Status: Abnormal   Collection Time: 12/17/20  3:11 AM   Specimen: Urine, Random  Result Value Ref Range Status   Specimen Description URINE, RANDOM  Final   Special Requests   Final    NONE Performed at [facility], [address], [place], [place] [address]    Culture >=100,000 COLONIES/mL KLEBSIELLA PNEUMONIAE (A)  Final   Report Status 12/19/2020 FINAL  Final   Organism ID, Bacteria KLEBSIELLA PNEUMONIAE (A)  Final      Susceptibility  Klebsiella pneumoniae - MIC*    AMPICILLIN RESISTANT Resistant     CEFAZOLIN <=4 SENSITIVE Sensitive     CEFEPIME <=0.12 SENSITIVE Sensitive     CEFTRIAXONE <=0.25 SENSITIVE Sensitive     CIPROFLOXACIN <=0.25 SENSITIVE Sensitive     GENTAMICIN <=1 SENSITIVE Sensitive     IMIPENEM <=0.25 SENSITIVE Sensitive     NITROFURANTOIN <=16 SENSITIVE Sensitive     TRIMETH/SULFA <=20 SENSITIVE Sensitive     AMPICILLIN/SULBACTAM <=2 SENSITIVE Sensitive     PIP/TAZO <=4 SENSITIVE Sensitive     * >=100,000 COLONIES/mL KLEBSIELLA PNEUMONIAE  Resp Panel by RT-PCR (Flu A&B, Covid) Nasopharyngeal  Swab     Status: None   Collection Time: 12/19/20  1:10 PM   Specimen: Nasopharyngeal Swab; Nasopharyngeal(NP) swabs in vial transport medium  Result Value Ref Range Status   SARS Coronavirus 2 by RT PCR NEGATIVE NEGATIVE Final    Comment: (NOTE) SARS-CoV-2 target nucleic acids are NOT DETECTED.  The SARS-CoV-2 RNA is generally detectable in upper respiratory specimens during the acute phase of infection. The lowest concentration of SARS-CoV-2 viral copies this assay can detect is 138 copies/mL. A negative result does not preclude SARS-Cov-2 infection and should not be used as the sole basis for treatment or other patient management decisions. A negative result may occur with  improper specimen collection/handling, submission of specimen other than nasopharyngeal swab, presence of viral mutation(s) within the areas targeted by this assay, and inadequate number of viral copies(<138 copies/mL). A negative result must be combined with clinical observations, patient history, and epidemiological information. The expected result is Negative.  Fact Sheet for Patients:  BloggerCourse.com  Fact Sheet for Healthcare Providers:  SeriousBroker.it  This test is no t yet approved or cleared by the [place] FDA and  has been authorized for detection and/or diagnosis of SARS-CoV-2 by FDA under an Emergency Use Authorization (EUA). This EUA will remain  in effect (meaning this test can be used) for the duration of the COVID-19 declaration under Section 564(b)(1) of the Act, 21 U.S.C.section 360bbb-3(b)(1), unless the authorization is terminated  or revoked sooner.       Influenza A by PCR NEGATIVE NEGATIVE Final   Influenza B by PCR NEGATIVE NEGATIVE Final    Comment: (NOTE) The Xpert Xpress SARS-CoV-2/FLU/RSV plus assay is intended as an aid in the diagnosis of influenza from Nasopharyngeal swab specimens and should not be used as a sole  basis for treatment. Nasal washings and aspirates are unacceptable for Xpert Xpress SARS-CoV-2/FLU/RSV testing.  Fact Sheet for Patients: BloggerCourse.com  Fact Sheet for Healthcare Providers: SeriousBroker.it  This test is not yet approved or cleared by the [place] FDA and has been authorized for detection and/or diagnosis of SARS-CoV-2 by FDA under an Emergency Use Authorization (EUA). This EUA will remain in effect (meaning this test can be used) for the duration of the COVID-19 declaration under Section 564(b)(1) of the Act, 21 U.S.C. section 360bbb-3(b)(1), unless the authorization is terminated or revoked.  Performed at [facility], [address], [place], [place] [address]      RN Pressure Injury Documentation: Pressure Injury 12/05/20 Coccyx Medial Stage 2 -  Partial thickness loss of dermis presenting as a shallow open injury with a red, pink wound bed without slough. 63mm diameter open circular abrasion, top layer skin not intact, red and blanchable, superior (Active)  12/05/20 2145  Location: Coccyx  Location Orientation: Medial  Staging: Stage 2 -  Partial thickness loss of dermis presenting as  a shallow open injury with a red, pink wound bed without slough.  Wound Description (Comments): 39mm diameter open circular abrasion, top layer skin not intact, red and blanchable, superior to medial coccyx  Present on Admission: Yes    Estimated body mass index is 16.96 kg/m as calculated from the following:   Height as of this encounter: 6\' 1"  (1.854 m).   Weight as of this encounter: 58.3 kg.  Malnutrition Type:  Nutrition Problem: Inadequate oral intake Etiology: poor appetite   Malnutrition Characteristics:  Signs/Symptoms: meal completion < 50%   Nutrition Interventions:  Interventions: MVI,Boost Breeze,Prostat,Refer to RD note for recommendations   Radiology Studies: No results  found.  Scheduled Meds: . (feeding supplement) PROSource Plus  30 mL Oral BID BM  . amLODipine  2.5 mg Oral Daily  . atorvastatin  20 mg Oral QHS  . divalproex  500 mg Oral Q12H  . enoxaparin (LOVENOX) injection  40 mg Subcutaneous Daily  . feeding supplement  237 mL Oral TID BM  . FLUoxetine  40 mg Oral Daily  . lubriderm seriously sensitive   Topical Daily  . mirtazapine  30 mg Oral QHS  . multivitamin with minerals  1 tablet Oral Daily  . tamsulosin  0.4 mg Oral QHS   Continuous Infusions: . cefTRIAXone (ROCEPHIN)  IV 1 g (12/19/20 1306)  . dextrose 5 % and 0.45% NaCl 1,000 mL (12/20/20 1016)    LOS: 15 days   Kerney Elbe, DO [facility] PAGER is on [place]  If 7PM-7AM, please contact night-coverage [email or web address]

## 2020-12-21 DIAGNOSIS — G40919 Epilepsy, unspecified, intractable, without status epilepticus: Secondary | ICD-10-CM | POA: Diagnosis not present

## 2020-12-21 DIAGNOSIS — R569 Unspecified convulsions: Secondary | ICD-10-CM | POA: Diagnosis not present

## 2020-12-21 DIAGNOSIS — E785 Hyperlipidemia, unspecified: Secondary | ICD-10-CM | POA: Diagnosis not present

## 2020-12-21 DIAGNOSIS — D649 Anemia, unspecified: Secondary | ICD-10-CM

## 2020-12-21 DIAGNOSIS — I10 Essential (primary) hypertension: Secondary | ICD-10-CM | POA: Diagnosis not present

## 2020-12-21 LAB — RETICULOCYTES
Immature Retic Fract: 25.8 % — ABNORMAL HIGH (ref 2.3–15.9)
RBC.: 2.61 MIL/uL — ABNORMAL LOW (ref 4.22–5.81)
Retic Count, Absolute: 91.4 10*3/uL (ref 19.0–186.0)
Retic Ct Pct: 3.5 % — ABNORMAL HIGH (ref 0.4–3.1)

## 2020-12-21 LAB — COMPREHENSIVE METABOLIC PANEL
ALT: 25 U/L (ref 0–44)
AST: 42 U/L — ABNORMAL HIGH (ref 15–41)
Albumin: 1.9 g/dL — ABNORMAL LOW (ref 3.5–5.0)
Alkaline Phosphatase: 46 U/L (ref 38–126)
Anion gap: 10 (ref 5–15)
BUN: 15 mg/dL (ref 8–23)
CO2: 28 mmol/L (ref 22–32)
Calcium: 8.1 mg/dL — ABNORMAL LOW (ref 8.9–10.3)
Chloride: 95 mmol/L — ABNORMAL LOW (ref 98–111)
Creatinine, Ser: 0.7 mg/dL (ref 0.61–1.24)
GFR, Estimated: >60 mL/min (ref >60)
Glucose, Bld: 93 mg/dL (ref 70–99)
Potassium: 4 mmol/L (ref 3.5–5.1)
Sodium: 133 mmol/L — ABNORMAL LOW (ref 135–145)
Total Bilirubin: 0.7 mg/dL (ref 0.3–1.2)
Total Protein: 5.4 g/dL — ABNORMAL LOW (ref 6.5–8.1)

## 2020-12-21 LAB — PHOSPHORUS: Phosphorus: 3.5 mg/dL (ref 2.5–4.6)

## 2020-12-21 LAB — CBC WITH DIFFERENTIAL/PLATELET
Abs Immature Granulocytes: 0.16 10*3/uL — ABNORMAL HIGH (ref 0.00–0.07)
Basophils Absolute: 0 10*3/uL (ref 0.0–0.1)
Basophils Relative: 0 %
Eosinophils Absolute: 0.1 10*3/uL (ref 0.0–0.5)
Eosinophils Relative: 1 %
HCT: 22 % — ABNORMAL LOW (ref 39.0–52.0)
Hemoglobin: 7.2 g/dL — ABNORMAL LOW (ref 13.0–17.0)
Immature Granulocytes: 2 %
Lymphocytes Relative: 18 %
Lymphs Abs: 1.8 10*3/uL (ref 0.7–4.0)
MCH: 30.8 pg (ref 26.0–34.0)
MCHC: 32.7 g/dL (ref 30.0–36.0)
MCV: 94 fL (ref 80.0–100.0)
Monocytes Absolute: 0.8 10*3/uL (ref 0.1–1.0)
Monocytes Relative: 8 %
Neutro Abs: 7.1 10*3/uL (ref 1.7–7.7)
Neutrophils Relative %: 71 %
Platelets: 338 10*3/uL (ref 150–400)
RBC: 2.34 MIL/uL — ABNORMAL LOW (ref 4.22–5.81)
RDW: 12.7 % (ref 11.5–15.5)
WBC: 10 10*3/uL (ref 4.0–10.5)
nRBC: 0 % (ref 0.0–0.2)

## 2020-12-21 LAB — HEMOGLOBIN AND HEMATOCRIT, BLOOD
HCT: 21.7 % — ABNORMAL LOW (ref 39.0–52.0)
HCT: 25.5 % — ABNORMAL LOW (ref 39.0–52.0)
Hemoglobin: 6.9 g/dL — CL (ref 13.0–17.0)
Hemoglobin: 8.5 g/dL — ABNORMAL LOW (ref 13.0–17.0)

## 2020-12-21 LAB — PREPARE RBC (CROSSMATCH)

## 2020-12-21 LAB — IRON AND TIBC
Iron: 20 ug/dL — ABNORMAL LOW (ref 45–182)
Saturation Ratios: 14 % — ABNORMAL LOW (ref 17.9–39.5)
TIBC: 143 ug/dL — ABNORMAL LOW (ref 250–450)
UIBC: 123 ug/dL

## 2020-12-21 LAB — VITAMIN B12: Vitamin B-12: 545 pg/mL (ref 180–914)

## 2020-12-21 LAB — FERRITIN: Ferritin: 1081 ng/mL — ABNORMAL HIGH (ref 24–336)

## 2020-12-21 LAB — FOLATE: Folate: 8.8 ng/mL (ref >5.9)

## 2020-12-21 LAB — MAGNESIUM: Magnesium: 1.7 mg/dL (ref 1.7–2.4)

## 2020-12-21 LAB — ABO/RH: ABO/RH(D): O POS

## 2020-12-21 MED ORDER — MAGNESIUM SULFATE 2 GM/50ML IV SOLN
2.0000 g | Freq: Once | INTRAVENOUS | Status: AC
  Administered 2020-12-21: 2 g via INTRAVENOUS
  Filled 2020-12-21: qty 50

## 2020-12-21 MED ORDER — SODIUM CHLORIDE 0.9% IV SOLUTION: Freq: Once | INTRAVENOUS | Status: AC

## 2020-12-21 NOTE — Progress Notes (Signed)
PROGRESS NOTE    JOURDEN KIRLEY  [id number] DOB: 1946/11/11 DOA: 12/04/2020 PCP: [name], DO   Brief Narrative:  The patient is a 75 year old elderly African-American male with a past medical history significant for but not limited to history of prostate cancer, depression, type 2 diabetes mellitus, hypertension, hyperlipidemia, history of seizure disorder, history of substance abuse as well as other comorbidities who presented to the emergency room with altered mental status.  Upon arrival to the ED he is noted to be hypertensive with a systolic blood pressure [id number] but the rest of his vital signs were stable.  In the ED his white blood cell count was 9.9 and blood glucose was 75.  Any having a head CT given his altered mental status and it showed no acute intracranial abnormality.  CTA of the head neck was done as well.  He had a normal CT perfusion study and an unchanged 60% stenosis of the proximal left internal carotid artery secondary to mixed density atherosclerosis.  He is seen by neurology after speak with family the patient's presentation was felt to be secondary to seizure activity at home.  He is admitted for further evaluation and management.  Was given IV valproic acid and is noted the patient has been bedbound since 2018.  During his hospital course he had poor p.o. intake and palliative care was consulted.  Patient daughter made her father DNR and it was noted that the patient's living will documents that he does not want artificial means of feeding him.  Initially after palliative care discussions family was inclined towards comfort care but then changed her mind and wanted aggressive care.  Patient will be going to a skilled nursing facility once bed is available and insurance authorization is obtained. Electrolytes are being replete.   12/17/20: Overnight he spiked a temperature of 100.9. He was pan-Cultured and worked up and UA indicitive of UTI. CXR this AM done and showed  "Small focus of opacity in the central right upper lobe, most likely atelectasis. Consider a focus of pneumonia if there are consistent clinical findings. No other evidence of acute cardiopulmonary disease." Will start Empiric IV Ceftriaxone. Will also get SLP again to evaluate for aspiration given that he was pocketing his food and medications not swallowing.   12/18/2020: Fever has improved and his blood count is stable. Urine culture is growing out greater than 100,000 colony-forming units of Klebsiella and awaiting Sensitivities.   12/19/2020: Labs are relatively stable and Urine Sensitivities show only resistance to Ampicillin.  Continue current antibiotics for now and will stop after 5 days  12/20/2020 Patient is resting comfortably in no acute distress.  Covid repeat testing was negative yesterday.  Dietary was evaluating his calorie intake and have made some recommendations with supplements.  His labs are fairly stable. Given his limited participation and "low rehab potential" his Authorization for SNF was denied and he will need long term care and likely will go home with [facility].   12/21/2020: Blood count has been slowly dropping today his hemoglobin this morning was 7.2 and repeat H&H showed a hemoglobin of 6.9.  And have been partly due to dilutional drop as patient was given D5W half-normal saline at 75 MLS per hour since 31 December.  We will stop his IV fluid hydration and give patient 1 unit of PRBCs and type and screen and also obtain an anemia panel.  We will also obtain FOBT to rule out GI bleeding.  Assessment & Plan:  Principal Problem:   Seizure ([place]) Active Problems:   Hypertension   Hyperlipidemia   Diabetes ([place])   Breakthrough seizure ([place])   Pressure injury of skin  Breakthrough Seizures -Likely secondary to medication noncompliance.  Has a history of underlying advanced dementia. -CT head and CTA head and neck: Negative for acute findings. -Valproic acid  level: Subtherapeutic on admission.  EEG positive for seizures. -Patient was given loading dose of IV valproate 600 mg in the ED.    -Continue IV Ativan 2 mg as needed for seizures like activity. -Continue Depacon 500 mg twice daily-changed  IV to p.o. -Repeat valproic acid level: Therapeutic.  Repeat EEG on 12/25: Negative for seizures.  Appreciate neurology's recommendations. -On seizure precautions -Consult PT/OT recommend SNF if able to participate, if not then he needs Long Term Care. -PT/OT signed off thinking that patient was going to hospice but will reconsult them and they are recommending SNF if he has been able to participate -We will consult TOC for assistance with placement and skilled nursing facility and workup has initiated and pending; social work states that patient has a bed available at [facility] and insurance authorization has been initiated; His Authorization has been denied as he has not been able to participate given his current condition; Will send Home with [facility] and appreciate TOC assistance for disposition and placement.   Poor Oral Intake -12/26: Start taking PO however he has inadequate oral intake related to poor appetite. -Continue to assist patient with meals -Appreciate dietitian's help and now we have discontinued his boost breeze and will continue Magic cup 3 times daily with meals, Ensure Enlive p.o. 3 times daily, 30 mL of Prosource plus p.o. twice daily, and multivitamin with minerals daily and they also recommending feeding assistance -Continue with Dysphagia 1 Diet -Is getting D5W half-normal saline at 75 MLS per hour but will stop this today and continue with calorie count -Repeat SLP evaluation given that he was noted to be pocketing his food and not swallowing is good to wax and wane. He was able to eat with speech therapy at bedside today and there continue to recommend dysphagia 1 diet with pure and thin liquids and recommending avoiding mixing  textures -He wanted to sleep today and does not really interact again  Fever from Klebsiella  Pneumoniae UTI -Fevers have resolved  -LA was 1.2 and PCT was <0.10 -Has no Leukocytosis as WBC is 9.7 today -Pan-Cx and pending blood cultures x2, urinalysis and urine culture as well as chest x-ray -Urinalysis showed a hazy appearance with yellow color urine, negative glucose, small hemoglobin, moderate leukocytes, negative nitrite, rare bacteria, 11-20 RBCs per high-power field and greater than 50 WBCs noted on urinalysis -Urine culture still pending as well as blood cultures; urine culture is growing out greater than 100,000 coliform units of Klebsiella; blood cultures 1 out of 4 cells likely contaminant -Chest x-ray showed "Small focus of opacity in the central right upper lobe, most likely atelectasis. Consider a focus of pneumonia if there are consistent clinical findings. No other evidence of acute cardiopulmonary disease." -He has completed IV ceftriaxone 5 doses -Continue to monitor temperature curve and WBC; now he is afebrile and white blood cell count 9.7 today -Repeat CBC in a.m. and continue with supportive care and antipyretics  Hypertension -Blood pressure is Stable.   -Continue Amlodipine 2.5 mg p.o. daily.   -Continue to monitor blood pressure closely.   -BP was 115/73 -Continue IV Hydralazine as needed if SBP  more than 170.  Hyperlipidemia -Continue Atorvastatin 20 mg po qHS  Hypokalemia -Patient's K+ is now 4.0 today -Mag Level was 1.7 and will replete with IV mag sulfate 2 g -Continue to Monitor and Replete as Necessary -Repeat CMP in the AM   Hypomagnesemia -Mag Level is now 1.7 -Replete with IV Mag Sulfate 2 grams again -Continue to Monitor and Replete as Necessary -Repeat Mag Level in the AM   Hypophosphatemia -Patient's Phos Level is now 3.5 -Continue to monitor and replete as necessary -Repeat phosphorus level in the a.m.  Hyponatremia -Mild and  patient sodium has been ranging from 132-134 range and today is 133 -Replete as above -Continue to Monitor and repeat CMP in AM   Normocytic Anemia -Baseline hemoglobin between 10-11.     -Hemoglobin/hematocrit went from 9.7/30.8 -> 8.8/26.7 -> 8.1/26.0 ->8.0/24.1 -> 7.6/23.6 -> 8.8/27.8 -> 7.2/22.0 and we repeated an H&H which showed a hemoglobin/hematocrit of 6.9/21.7; this could have been partly due to dilutional drop as he is getting D5 half-normal saline at 75 mL's per hour but we will stop this and unlikely that his hemoglobin has dropped almost 3 g secondary distal fluid and dilution -We will hold his enoxaparin -Check anemia panel -We will type and screen and transfuse 1 unit PRBCs -We will check FOBT currently no overt signs and symptoms of bleeding -Continue to monitor H&H closely -Continue to monitor for signs and symptoms of bleeding currently no overt bleeding noted next- -Repeat CBC in the a.m.  Thrombocytopenia -Improved.   -Patient platelet count went from 131 -> 338 today -Continue to monitor for signs and symptoms of bleeding; currently no overt bleeding noted-repeat CBC in a.m.  Severe protein calorie malnutrition/Underweight -Estimated body mass index is 16.96 kg/m as calculated from the following:   Height as of this encounter: 6\' 1"  (1.854 m).   Weight as of this encounter: 58.3 kg. -Albumin-2.2.  Consulted dietitian-continue multivitamin and feeding supplement as per dietitian's recommendations as above.   -Monitor for signs for refeeding syndrome -Replace electrolytes as necessary.   -Is IV fluids with D5 half-normal saline has now been stopped Laurey Arrow SLP evaluation given that he is pocketing his pills and food; SLP evaluated schedule I pure with thin liquids and recommending medicated administration with crushed with pure and they are recommending to avoid mixed textures including solids and liquids at the same time; SLP recommends that generally this  patient's tolerance of p.o. will wax and wane as he has a multifactorial variables that will increase his risk for aspiration such as feeding methods, positioning and attention. -Caloric count results are inadequate as documentation has been spotty.  Nutrition is advising to continue the calorie count for now and has educated nursing staff about the importance of adequate supplement documentation   History of Substance Abuse  -UDS negative this time.   Pressure Injury Pressure Injury 12/05/20 Coccyx Medial Stage 2 -  Partial thickness loss of dermis presenting as a shallow open injury with a red, pink wound bed without slough. 12mm diameter open circular abrasion, top layer skin not intact, red and blanchable, superior (Active)  12/05/20 2145  Location: Coccyx  Location Orientation: Medial  Staging: Stage 2 -  Partial thickness loss of dermis presenting as a shallow open injury with a red, pink wound bed without slough.  Wound Description (Comments): 79mm diameter open circular abrasion, top layer skin not intact, red and blanchable, superior to medial coccyx  Present on Admission: Yes  -C/w Wound care  Depression -  C/w Fluoxetine 40 mg po Daily and Mirtazapine 30 mg po qHS and Temazepam 7.5 mg po qHS  Goals of care -Patient is DNR/DNI as per his living well.  It has also been noted in patient's living will documents that he would not want artificial means of feeding. -Palliative was on board-initially patient sister (who is also HCPOA) was inclined towards comfort care however she changed her mind and became very rude with palliative NP & Dr. Doristine Bosworth & wanted to start tube feedings which was against patient's living will.  -Neurology was consulted again & we agreed with short term tube feedings (10-14 days) however patient improved and started taking p.o. following day. Tube placement/feedings never started.  Will initiate calorie count to reassess -Palliative care is not following patient  anymore.  Hospice consult was discontinued ad plan is to go to SNF now that work-up is initiated insurance authorization is pending.  DVT prophylaxis: Enoxaparin 40 mg sq Daily is now being held due to his anemia Code Status: DO NOT RESUSCITATE  Family Communication: No family present at bedside and I called the Sister [name] to give her an update at 39 and she did not pick up  Disposition Plan: Was pending discharge to SNF when bed is available and insurance authorization is obtained however is Authorization was denied given his "low rehab potential" and limited ability to participate; PT is recommending long-term care if he is unable to participate and he will need to ensure that he is not bleeding prior to discharging and likely will need to go home with home services  Status is: Inpatient  Remains inpatient appropriate because:Unsafe d/c plan, IV treatments appropriate due to intensity of illness or inability to take PO and Inpatient level of care appropriate due to severity of illness   Dispo: The patient is from: Home              Anticipated d/c is to: Home with [facility] now that he has been denied for SNF              Anticipated d/c date is: 1-2 days              Patient currently is not medically stable to d/c.  Consultants:   Neurology  Palliative Care Medicine    Procedures: CT Head  EEG IMPRESSION: This study showed evidence of epileptogenicity arising left temporoparietal region as well as mild diffuse encephalopathy, nonspecific etiology. No seizures were seen throughout the recording  Antimicrobials:  Anti-infectives (From admission, onward)   Start     Dose/Rate Route Frequency Ordered Stop   12/17/20 0900  cefTRIAXone (ROCEPHIN) 1 g in sodium chloride 0.9 % 100 mL IVPB        1 g 200 mL/hr over 30 Minutes Intravenous Daily 12/17/20 0823 12/21/20 1048        Subjective: Seen and examined at bedside and he awakes to physical and verbal stimuli  but ambulate back to sleep.  Hemoglobin has dropped but no overt signs or symptoms of bleeding.  We will type and screen and transfuse 1 unit PRBCs and stop with IV fluid hydration currently.  We will check an anemia panel.  Currently no family again at bedside.  No other concerns or complaints at this time and patient is resting and minimally interacts again.  Objective: Vitals:   12/21/20 0344 12/21/20 0405 12/21/20 0758 12/21/20 1325  BP: (!) 116/53 (!) 106/53 113/62 (!) 125/57  Pulse: 88 79 79 78  Resp: 16 20 18 18   Temp: 99.3 F (37.4 C) 99.4 F (37.4 C) 99.1 F (37.3 C) 98 F (36.7 C)  TempSrc: Oral Oral Axillary Axillary  SpO2: 99% 100% 100% 100%  Weight:      Height:        Intake/Output Summary (Last 24 hours) at 12/21/2020 1401 Last data filed at 12/21/2020 1326 Gross per 24 hour  Intake 610 ml  Output 3300 ml  Net -2690 ml   Filed Weights   12/04/20 2310 12/04/20 2312  Weight: 58.3 kg 58.3 kg   Examination: Physical Exam:  Constitutional: The patient is a thin chronically ill-appearing African-American male currently no acute distress and opens his eyes to physical verbal stimuli and does look to do with his eyes but goes back to sleep and appears in no acute distress.  Minimally interacts again today. Eyes: Lids and conjunctivae normal, sclerae anicteric  ENMT: External Ears, Nose appear normal. Grossly normal hearing. Neck: Appears normal, supple, no cervical masses, normal ROM, no appreciable thyromegaly; no appreciable JVD Respiratory: Diminished to auscultation bilaterally with coarse breath sounds, no wheezing, rales, rhonchi or crackles. Normal respiratory effort and patient is not tachypenic. No accessory muscle use.  Unlabored breathing Cardiovascular: RRR, no murmurs / rubs / gallops. S1 and S2 auscultated.  Has some mild pedal edema Abdomen: Soft, non-tender, non-distended. Bowel sounds positive.  GU: Deferred. Musculoskeletal: No clubbing / cyanosis of  digits/nails. No joint deformity upper and lower extremities.  Skin: No rashes, lesions, ulcers on limited skin evaluation I did not turn him to see his coccyx pressure injury today.. No induration; Warm and dry.  Neurologic: Minimally interactive today and opens his eyes to verbal and physical stimuli but does not want to really interact and does not follow commands.  He will continue with his eyes and then go back to sleep Psychiatric: Impaired normal judgment and insight. Normal and calm mood and appropriate affect.   Data Reviewed: I have personally reviewed following labs and imaging studies  CBC: Recent Labs  Lab 12/17/20 0250 12/18/20 0226 12/19/20 0310 12/20/20 0129 12/21/20 0613 12/21/20 1204  WBC 9.6 8.2 8.8 9.7 10.0  --   NEUTROABS 7.1 6.1 5.9 7.1 7.1  --   HGB 8.1* 8.0* 7.6* 8.8* 7.2* 6.9*  HCT 26.0* 24.1* 23.6* 27.8* 22.0* 21.7*  MCV 97.0 94.5 94.4 94.6 94.0  --   PLT 301 295 304 333 338  --    Basic Metabolic Panel: Recent Labs  Lab 12/17/20 0250 12/18/20 0226 12/19/20 0310 12/20/20 0129 12/21/20 0613  NA 133* 132* 132* 132* 133*  K 4.7 4.0 4.1 4.4 4.0  CL 93* 95* 93* 93* 95*  CO2 27 28 28 28 28   GLUCOSE 109* 114* 98 116* 93  BUN 13 13 14 14 15   CREATININE 0.71 0.76 0.69 0.75 0.70  CALCIUM 8.3* 8.2* 8.1* 8.2* 8.1*  MG 1.7 1.5* 1.8 1.8 1.7  PHOS 3.6 2.8 2.6 2.6 3.5   GFR: Estimated Creatinine Clearance: 67.8 mL/min (by C-G formula based on SCr of 0.7 mg/dL). Liver Function Tests: Recent Labs  Lab 12/17/20 0250 12/18/20 0226 12/19/20 0310 12/20/20 0129 12/21/20 0613  AST 17 25 36 47* 42*  ALT 12 14 16 22 25   ALKPHOS 42 40 47 54 46  BILITOT 0.6 0.8 0.3 0.7 0.7  PROT 5.6* 5.7* 5.6* 6.1* 5.4*  ALBUMIN 2.2* 2.2* 2.0* 2.2* 1.9*   No results for input(s): LIPASE, AMYLASE in the last 168 hours. No results for  input(s): AMMONIA in the last 168 hours. Coagulation Profile: No results for input(s): INR, PROTIME in the last 168 hours. Cardiac  Enzymes: No results for input(s): CKTOTAL, CKMB, CKMBINDEX, TROPONINI in the last 168 hours. BNP (last 3 results) No results for input(s): PROBNP in the last 8760 hours. HbA1C: No results for input(s): HGBA1C in the last 72 hours. CBG: Recent Labs  Lab 12/19/20 1238 12/19/20 1639  GLUCAP 103* 106*   Lipid Profile: No results for input(s): CHOL, HDL, LDLCALC, TRIG, CHOLHDL, LDLDIRECT in the last 72 hours. Thyroid Function Tests: No results for input(s): TSH, T4TOTAL, FREET4, T3FREE, THYROIDAB in the last 72 hours. Anemia Panel: No results for input(s): VITAMINB12, FOLATE, FERRITIN, TIBC, IRON, RETICCTPCT in the last 72 hours. Sepsis Labs: Recent Labs  Lab 12/17/20 0250  PROCALCITON <0.10  LATICACIDVEN 1.2    Recent Results (from the past 240 hour(s))  Culture, blood (routine x 2)     Status: None (Preliminary result)   Collection Time: 12/17/20  2:50 AM   Specimen: BLOOD  Result Value Ref Range Status   Specimen Description BLOOD RIGHT ANTECUBITAL  Final   Special Requests   Final    BOTTLES DRAWN AEROBIC AND ANAEROBIC Blood Culture adequate volume   Culture   Final    NO GROWTH 4 DAYS Performed at [facility], [address] [place], [place] [address]    Report Status PENDING  Incomplete  Culture, blood (routine x 2)     Status: Abnormal   Collection Time: 12/17/20  3:01 AM   Specimen: BLOOD RIGHT ARM  Result Value Ref Range Status   Specimen Description BLOOD RIGHT ARM  Final   Special Requests AEROBIC BOTTLE ONLY Blood Culture adequate volume  Final   Culture  Setup Time   Final    GRAM POSITIVE COCCI IN CLUSTERS AEROBIC BOTTLE ONLY Organism ID to follow CRITICAL RESULT CALLED TO, READ BACK BY AND VERIFIED WITH: PHARMD M [place] [id number] MLM    Culture (A)  Final    STAPHYLOCOCCUS CAPITIS THE SIGNIFICANCE OF ISOLATING THIS ORGANISM FROM A SINGLE SET OF BLOOD CULTURES WHEN MULTIPLE SETS ARE DRAWN IS UNCERTAIN. PLEASE NOTIFY THE MICROBIOLOGY DEPARTMENT  WITHIN ONE WEEK IF SPECIATION AND SENSITIVITIES ARE REQUIRED. Performed at [facility], [place] [address] [place],  [address]    Report Status 12/19/2020 FINAL  Final  Blood Culture ID Panel (Reflexed)     Status: Abnormal   Collection Time: 12/17/20  3:01 AM  Result Value Ref Range Status   Enterococcus faecalis NOT DETECTED NOT DETECTED Final   Enterococcus Faecium NOT DETECTED NOT DETECTED Final   Listeria monocytogenes NOT DETECTED NOT DETECTED Final   Staphylococcus species DETECTED (A) NOT DETECTED Final    Comment: CRITICAL RESULT CALLED TO, READ BACK BY AND VERIFIED WITH: PHARMD M MACCIA [id number] MLM    Staphylococcus aureus (BCID) NOT DETECTED NOT DETECTED Final   Staphylococcus epidermidis NOT DETECTED NOT DETECTED Final   Staphylococcus lugdunensis NOT DETECTED NOT DETECTED Final   Streptococcus species NOT DETECTED NOT DETECTED Final   Streptococcus agalactiae NOT DETECTED NOT DETECTED Final   Streptococcus pneumoniae NOT DETECTED NOT DETECTED Final   Streptococcus pyogenes NOT DETECTED NOT DETECTED Final   A.calcoaceticus-baumannii NOT DETECTED NOT DETECTED Final   Bacteroides fragilis NOT DETECTED NOT DETECTED Final   Enterobacterales NOT DETECTED NOT DETECTED Final   Enterobacter cloacae complex NOT DETECTED NOT DETECTED Final   Escherichia coli NOT DETECTED NOT DETECTED Final  Klebsiella aerogenes NOT DETECTED NOT DETECTED Final   Klebsiella oxytoca NOT DETECTED NOT DETECTED Final   Klebsiella pneumoniae NOT DETECTED NOT DETECTED Final   Proteus species NOT DETECTED NOT DETECTED Final   Salmonella species NOT DETECTED NOT DETECTED Final   Serratia marcescens NOT DETECTED NOT DETECTED Final   Haemophilus influenzae NOT DETECTED NOT DETECTED Final   Neisseria meningitidis NOT DETECTED NOT DETECTED Final   Pseudomonas aeruginosa NOT DETECTED NOT DETECTED Final   Stenotrophomonas maltophilia NOT DETECTED NOT DETECTED Final   Candida albicans NOT  DETECTED NOT DETECTED Final   Candida auris NOT DETECTED NOT DETECTED Final   Candida glabrata NOT DETECTED NOT DETECTED Final   Candida krusei NOT DETECTED NOT DETECTED Final   Candida parapsilosis NOT DETECTED NOT DETECTED Final   Candida tropicalis NOT DETECTED NOT DETECTED Final   Cryptococcus neoformans/gattii NOT DETECTED NOT DETECTED Final    Comment: Performed at [facility], [place] [address] [place], [place] [address]  Culture, Urine     Status: Abnormal   Collection Time: 12/17/20  3:11 AM   Specimen: Urine, Random  Result Value Ref Range Status   Specimen Description URINE, RANDOM  Final   Special Requests   Final    NONE Performed at [facility], [place] [address] [place], [place] [address]    Culture >=100,000 COLONIES/mL KLEBSIELLA PNEUMONIAE (A)  Final   Report Status 12/19/2020 FINAL  Final   Organism ID, Bacteria KLEBSIELLA PNEUMONIAE (A)  Final      Susceptibility   Klebsiella pneumoniae - MIC*    AMPICILLIN RESISTANT Resistant     CEFAZOLIN <=4 SENSITIVE Sensitive     CEFEPIME <=0.12 SENSITIVE Sensitive     CEFTRIAXONE <=0.25 SENSITIVE Sensitive     CIPROFLOXACIN <=0.25 SENSITIVE Sensitive     GENTAMICIN <=1 SENSITIVE Sensitive     IMIPENEM <=0.25 SENSITIVE Sensitive     NITROFURANTOIN <=16 SENSITIVE Sensitive     TRIMETH/SULFA <=20 SENSITIVE Sensitive     AMPICILLIN/SULBACTAM <=2 SENSITIVE Sensitive     PIP/TAZO <=4 SENSITIVE Sensitive     * >=100,000 COLONIES/mL KLEBSIELLA PNEUMONIAE  Resp Panel by RT-PCR (Flu A&B, Covid) Nasopharyngeal Swab     Status: None   Collection Time: 12/19/20  1:10 PM   Specimen: Nasopharyngeal Swab; Nasopharyngeal(NP) swabs in vial transport medium  Result Value Ref Range Status   SARS Coronavirus 2 by RT PCR NEGATIVE NEGATIVE Final    Comment: (NOTE) SARS-CoV-2 target nucleic acids are NOT DETECTED.  The SARS-CoV-2 RNA is generally detectable in upper respiratory specimens during the acute phase of infection. The  lowest concentration of SARS-CoV-2 viral copies this assay can detect is 138 copies/mL. A negative result does not preclude SARS-Cov-2 infection and should not be used as the sole basis for treatment or other patient management decisions. A negative result may occur with  improper specimen collection/handling, submission of specimen other than nasopharyngeal swab, presence of viral mutation(s) within the areas targeted by this assay, and inadequate number of viral copies(<138 copies/mL). A negative result must be combined with clinical observations, patient history, and epidemiological information. The expected result is Negative.  Fact Sheet for Patients:  EntrepreneurPulse.com.au  Fact Sheet for Healthcare Providers:  IncredibleEmployment.be  This test is no t yet approved or cleared by the [place] FDA and  has been authorized for detection and/or diagnosis of SARS-CoV-2 by FDA under an Emergency Use Authorization (EUA). This EUA will remain  in effect (meaning this test can be  used) for the duration of the COVID-19 declaration under Section 564(b)(1) of the Act, 21 U.S.C.section 360bbb-3(b)(1), unless the authorization is terminated  or revoked sooner.       Influenza A by PCR NEGATIVE NEGATIVE Final   Influenza B by PCR NEGATIVE NEGATIVE Final    Comment: (NOTE) The Xpert Xpress SARS-CoV-2/FLU/RSV plus assay is intended as an aid in the diagnosis of influenza from Nasopharyngeal swab specimens and should not be used as a sole basis for treatment. Nasal washings and aspirates are unacceptable for Xpert Xpress SARS-CoV-2/FLU/RSV testing.  Fact Sheet for Patients: EntrepreneurPulse.com.au  Fact Sheet for Healthcare Providers: IncredibleEmployment.be  This test is not yet approved or cleared by the [place] FDA and has been authorized for detection and/or diagnosis of SARS-CoV-2 by FDA under  an Emergency Use Authorization (EUA). This EUA will remain in effect (meaning this test can be used) for the duration of the COVID-19 declaration under Section 564(b)(1) of the Act, 21 U.S.C. section 360bbb-3(b)(1), unless the authorization is terminated or revoked.  Performed at [facility], [place] [address] [place], [place] [address]      RN Pressure Injury Documentation: Pressure Injury 12/05/20 Coccyx Medial Stage 2 -  Partial thickness loss of dermis presenting as a shallow open injury with a red, pink wound bed without slough. 3mm diameter open circular abrasion, top layer skin not intact, red and blanchable, superior (Active)  12/05/20 2145  Location: Coccyx  Location Orientation: Medial  Staging: Stage 2 -  Partial thickness loss of dermis presenting as a shallow open injury with a red, pink wound bed without slough.  Wound Description (Comments): 63mm diameter open circular abrasion, top layer skin not intact, red and blanchable, superior to medial coccyx  Present on Admission: Yes    Estimated body mass index is 16.96 kg/m as calculated from the following:   Height as of this encounter: 6\' 1"  (1.854 m).   Weight as of this encounter: 58.3 kg.  Malnutrition Type:  Nutrition Problem: Inadequate oral intake Etiology: poor appetite   Malnutrition Characteristics:  Signs/Symptoms: meal completion < 50%   Nutrition Interventions:  Interventions: MVI,Boost Breeze,Prostat,Refer to RD note for recommendations   Radiology Studies: No results found.  Scheduled Meds: . (feeding supplement) PROSource Plus  30 mL Oral BID BM  . sodium chloride   Intravenous Once  . amLODipine  2.5 mg Oral Daily  . atorvastatin  20 mg Oral QHS  . divalproex  500 mg Oral Q12H  . feeding supplement  237 mL Oral QID  . FLUoxetine  40 mg Oral Daily  . lubriderm seriously sensitive   Topical Daily  . mirtazapine  30 mg Oral QHS  . multivitamin with minerals  1 tablet Oral Daily  .  tamsulosin  0.4 mg Oral QHS   Continuous Infusions: . dextrose 5 % and 0.45% NaCl 75 mL/hr at 12/21/20 0347    LOS: 16 days   Kerney Elbe, DO [facility] PAGER is on AMION  If 7PM-7AM, please contact night-coverage [email or web address]

## 2020-12-21 NOTE — Progress Notes (Signed)
  Speech Language Pathology Treatment: Dysphagia  Patient Details Name: Brad [name] MRN: [id number] DOB: Aug 14, 1946 Today's Date: 12/21/2020 Time: 3007-6226 SLP Time Calculation (min) (ACUTE ONLY): 10 min  Assessment / Plan / Recommendation Clinical Impression  Pt presents similarly in terms of his oropharyngeal swallowing. He has prolonged oral preparation with increased time from bolus presentation until hyolaryngeal movement is noted to palpation. This is more prominent with purees, and use of a dry spoon (x1-2 per bolus) and verbal cueing appeared to facilitate oral transit. Thin liquids are consumed more swiftly, taking large consecutive boluses without overt signs of intolerance. Strategies were also reviewed with NT. No changes to current diet or POC.    HPI HPI: Pt is a 75 yo male presenting with AMS. CTH/CTA negative for acute findings; MRI pending. Per MD, there is concern for breakthrough seizure due to missed medications. Pt evaluated, progressed to puree and thin after a period of prolonged lethargy, SLP signed off. MD reordered on 12/17/20 due to temp spike and RN report of oral holding.  PMH includes: prostate ca, anemia, arthritis, dementia, depression, DM, HTN, seizures, substance abuse. Previous MBS in June 2020 revealed normal oropharyngeal swallow but with pt c/o globus sensation. Esophagram the same day revealed moderate esophageal dysmotility.      SLP Plan  Continue with current plan of care       Recommendations  Diet recommendations: Dysphagia 1 (puree);Thin liquid Liquids provided via: Straw;Cup Medication Administration: Crushed with puree Supervision: Full supervision/cueing for compensatory strategies;Staff to assist with self feeding Compensations: Slow rate;Small sips/bites;Minimize environmental distractions;Follow solids with liquid Postural Changes and/or Swallow Maneuvers: Seated upright 90 degrees;Upright 30-60 min after meal                Oral  Care Recommendations: Oral care BID Follow up Recommendations: Skilled Nursing facility SLP Visit Diagnosis: Dysphagia, oropharyngeal phase (R13.12) Plan: Continue with current plan of care       GO                Brad [name]., M.A. CCC-SLP Acute Rehabilitation Services Pager [phone] Office [phone]  12/21/2020, 12:15 PM

## 2020-12-21 NOTE — Progress Notes (Signed)
Physical Therapy Treatment Patient Details Name: Brad [name] MRN: [id number] DOB: June 12, 1946 Today's Date: 12/21/2020    History of Present Illness Pt is 75 yo male presenting with AMS. CTH/CTA negative for acute findings; MRI pending. Per MD, there is concern for breakthrough seizure due to missed medications. 12/19 EEG 2 focal motor seizures (RUE rhythmic twitching) suggestive of PRES. PMH includes: prostate ca, anemia, arthritis, dementia, depression, DM, HTN, seizures, substance abuse.  Noted that initial plan was for hospice at d/c but family has changed their minds and want aggressive care at [facility].  Pt unable to provide PLOF but appeared to be living at home.  Did note in MD note from 12/16/20 that pt has been bedbound since 2018.    PT Comments    Patient alert today and trying to converse some but difficulty answering questions directly. Total A for bed mobility; limited mainly by pain in BLEs with any movement. Able to sit EOB with Mod A progressing to close min guard for a few minutes but poor sitting tolerance due to pain in bil knees with pt pushing posteriorly to return to supine. If family takes pt home, recommend hospital bed with overlay mattress as pt at high risk for pressure wounds and hoyer lift is planning to transfer pt OOB. Will follow.   Follow Up Recommendations  Other (comment) (SNF vs long term care pending ablity to participate)     Equipment Recommendations  Hospital bed;Other (comment) (lift)    Recommendations for Other Services       Precautions / Restrictions Precautions Precautions: Fall Precaution Comments: Seizures Required Braces or Orthoses: Other Brace Other Brace: R palm guard; Restrictions Weight Bearing Restrictions: No    Mobility  Bed Mobility Overal bed mobility: Needs Assistance Bed Mobility: Supine to Sit;Sit to Supine     Supine to sit: Total assist;+2 for safety/equipment;+2 for physical assistance Sit to supine: +2 for  safety/equipment;Total assist;+2 for physical assistance   General bed mobility comments: Total A grossly. Patient with minimal initiation for mobility; resisting at times due to pain in BLEs with movement.  Transfers                 General transfer comment: Unsafe/unable  Ambulation/Gait                 Stairs             Wheelchair Mobility    Modified Rankin (Stroke Patients Only)       Balance Overall balance assessment: Needs assistance Sitting-balance support: Feet supported;Single extremity supported Sitting balance-Leahy Scale: Poor Sitting balance - Comments: Pt leaning right; able to get closer to midline with cues to lean on left elbow. Pushing posteriorly at times due to pain. Postural control: Right lateral lean                                  Cognition Arousal/Alertness: Awake/alert Behavior During Therapy: Flat affect Overall Cognitive Status: No family/caregiver present to determine baseline cognitive functioning                                 General Comments: Follows a few simple commands today; difficulty answering questions related to topic.      Exercises Other Exercises Other Exercises: BLEs contracted into knee/hip flexion; able to actively extend minimally a few degrees without pain x10  General Comments        Pertinent Vitals/Pain Pain Assessment: Faces Faces Pain Scale: Hurts whole lot Pain Location: LEs with any movement Pain Descriptors / Indicators: Guarding;Grimacing;Moaning Pain Intervention(s): Monitored during session;Limited activity within patient's tolerance    Home Living                      Prior Function            PT Goals (current goals can now be found in the care plan section) Progress towards PT goals: Not progressing toward goals - comment (due to pain/cognition)    Frequency    Min 2X/week      PT Plan Current plan remains appropriate     Co-evaluation              AM-PAC PT "6 Clicks" Mobility   Outcome Measure  Help needed turning from your back to your side while in a flat bed without using bedrails?: Total Help needed moving from lying on your back to sitting on the side of a flat bed without using bedrails?: Total Help needed moving to and from a bed to a chair (including a wheelchair)?: Total Help needed standing up from a chair using your arms (e.g., wheelchair or bedside chair)?: Total Help needed to walk in hospital room?: Total Help needed climbing 3-5 steps with a railing? : Total 6 Click Score: 6    End of Session   Activity Tolerance: Patient limited by pain Patient left: in bed;with call bell/phone within reach;with bed alarm set Nurse Communication: Mobility status;Need for lift equipment PT Visit Diagnosis: Other symptoms and signs involving the nervous system [id number])     Time: [id number] PT Time Calculation (min) (ACUTE ONLY): 12 min  Charges:  $Therapeutic Activity: 8-22 mins                     Marisa Severin, PT, DPT Acute Rehabilitation Services Pager [phone] Office [place] 12/21/2020, 3:23 PM

## 2020-12-21 NOTE — Progress Notes (Signed)
Calorie Count Note  48 hour calorie count ordered.  Diet: Dysphagia 1 with thin liquids Supplements: Magic Cup TID, Ensure Enlive QID, 12ml Prosource Plus po BID  Day 1 Results (12/21/19): Breakfast: 194 kcals, 9.5 grams protein Lunch: 0 kcals, 0 grams protein Dinner: 410 kcals, 16.7 grams protein Supplements: limited supplement documentation available in calorie count envelope. 1 Ensure documented on meal ticket, which provided 350 kcals and 13 grams protein  Total intake: 954 kcal (49% of minimum estimated needs)  39 grams protein (41% of minimum estimated needs)  Estimated Nutritional Needs:  Kcal:  1950-2150 Protein:  95-105 grams Fluid:  >1.95L/d  There has been limited documentation of supplement consumption in calorie count envelope thus far -- only one Ensure has been accounted for in the calorie count. Nursing staff has been documenting in the [facility] that pt has received Ensure QID and Prosource BID, but there is no indication as to how much of each supplement he consumed. Discussed importance of adequate supplement documentation with RN.   Nutrition Dx: Inadequate oral intake related to poor appetite as evidenced by meal completion < 50%  Goal: Patient will meet greater than or equal to 90% of their needs  Intervention:   Continue calorie count   Continue Magic cup TID with meals, each supplement provides 290 kcal and 9 grams of protein  Continue Ensure Enlive poQID, each supplement provides 350 kcal and 20 grams of protein  Continue23ml Prosource Plus po BID, each supplement provides 100 kcals and 15 grams of protein  Continue MVI daily  Continue to assist pt with meals   Eugene Gavia, MS, RD, LDN RD pager number and weekend/on-call pager number located in Amion.

## 2020-12-22 DIAGNOSIS — R569 Unspecified convulsions: Secondary | ICD-10-CM | POA: Diagnosis not present

## 2020-12-22 LAB — CBC WITH DIFFERENTIAL/PLATELET
Abs Immature Granulocytes: 0.2 10*3/uL — ABNORMAL HIGH (ref 0.00–0.07)
Basophils Absolute: 0 10*3/uL (ref 0.0–0.1)
Basophils Relative: 0 %
Eosinophils Absolute: 0.1 10*3/uL (ref 0.0–0.5)
Eosinophils Relative: 1 %
HCT: 25.6 % — ABNORMAL LOW (ref 39.0–52.0)
Hemoglobin: 8.4 g/dL — ABNORMAL LOW (ref 13.0–17.0)
Immature Granulocytes: 2 %
Lymphocytes Relative: 17 %
Lymphs Abs: 1.7 10*3/uL (ref 0.7–4.0)
MCH: 30 pg (ref 26.0–34.0)
MCHC: 32.8 g/dL (ref 30.0–36.0)
MCV: 91.4 fL (ref 80.0–100.0)
Monocytes Absolute: 0.9 10*3/uL (ref 0.1–1.0)
Monocytes Relative: 9 %
Neutro Abs: 6.9 10*3/uL (ref 1.7–7.7)
Neutrophils Relative %: 71 %
Platelets: 330 10*3/uL (ref 150–400)
RBC: 2.8 MIL/uL — ABNORMAL LOW (ref 4.22–5.81)
RDW: 14.1 % (ref 11.5–15.5)
WBC: 9.9 10*3/uL (ref 4.0–10.5)
nRBC: 0 % (ref 0.0–0.2)

## 2020-12-22 LAB — COMPREHENSIVE METABOLIC PANEL
ALT: 32 U/L (ref 0–44)
AST: 46 U/L — ABNORMAL HIGH (ref 15–41)
Albumin: 1.8 g/dL — ABNORMAL LOW (ref 3.5–5.0)
Alkaline Phosphatase: 53 U/L (ref 38–126)
Anion gap: 12 (ref 5–15)
BUN: 21 mg/dL (ref 8–23)
CO2: 26 mmol/L (ref 22–32)
Calcium: 7.9 mg/dL — ABNORMAL LOW (ref 8.9–10.3)
Chloride: 96 mmol/L — ABNORMAL LOW (ref 98–111)
Creatinine, Ser: 0.68 mg/dL (ref 0.61–1.24)
GFR, Estimated: >60 mL/min (ref >60)
Glucose, Bld: 87 mg/dL (ref 70–99)
Potassium: 4.1 mmol/L (ref 3.5–5.1)
Sodium: 134 mmol/L — ABNORMAL LOW (ref 135–145)
Total Bilirubin: 0.6 mg/dL (ref 0.3–1.2)
Total Protein: 5.5 g/dL — ABNORMAL LOW (ref 6.5–8.1)

## 2020-12-22 LAB — TYPE AND SCREEN
ABO/RH(D): O POS
Antibody Screen: NEGATIVE
Unit division: 0

## 2020-12-22 LAB — CULTURE, BLOOD (ROUTINE X 2)
Culture: NO GROWTH
Special Requests: ADEQUATE

## 2020-12-22 LAB — BPAM RBC
Blood Product Expiration Date: 202202082359
ISSUE DATE / TIME: 202201041614
Unit Type and Rh: 5100

## 2020-12-22 LAB — PHOSPHORUS: Phosphorus: 2.9 mg/dL (ref 2.5–4.6)

## 2020-12-22 LAB — MAGNESIUM: Magnesium: 1.8 mg/dL (ref 1.7–2.4)

## 2020-12-22 NOTE — Plan of Care (Signed)
  Problem: Health Behavior/Discharge Planning: Goal: Ability to manage health-related needs will improve Outcome: Progressing   Problem: Clinical Measurements: Goal: Ability to maintain clinical measurements within normal limits will improve Outcome: Progressing Goal: Will remain free from infection Outcome: Progressing Goal: Respiratory complications will improve Outcome: Progressing Goal: Cardiovascular complication will be avoided Outcome: Progressing   Problem: Activity: Goal: Risk for activity intolerance will decrease Outcome: Progressing   Problem: Nutrition: Goal: Adequate nutrition will be maintained Outcome: Progressing   Problem: Coping: Goal: Level of anxiety will decrease Outcome: Progressing   Problem: Elimination: Goal: Will not experience complications related to bowel motility Outcome: Progressing Goal: Will not experience complications related to urinary retention Outcome: Progressing   Problem: Safety: Goal: Ability to remain free from injury will improve Outcome: Progressing   Problem: Medication: Goal: Risk for medication side effects will decrease Outcome: Progressing   Problem: Self-Concept: Goal: Level of anxiety will decrease Outcome: Progressing

## 2020-12-22 NOTE — Progress Notes (Signed)
PROGRESS NOTE    Brad [name]  [id number] DOB: 1946-10-19 DOA: 12/04/2020 PCP: [name], DO     Brief Narrative:  Brad [name] is a 75 year old elderly African-American male with a past medical history significant for but not limited to history of prostate cancer, depression, type 2 diabetes mellitus, hypertension, hyperlipidemia, history of seizure disorder, history of substance abuse as well as other comorbidities who presented to the emergency room with altered mental status.  Upon arrival to the ED he is noted to be hypertensive with a systolic blood pressure [id number] but the rest of his vital signs were stable.  In the ED his white blood cell count was 9.9 and blood glucose was 75.  Head CT given his altered mental status and it showed no acute intracranial abnormality.  CTA of the head neck was done as well.  He had a normal CT perfusion study and an unchanged 60% stenosis of the proximal left internal carotid artery secondary to mixed density atherosclerosis.  He is seen by neurology after speaking with family, the patient's presentation was felt to be secondary to seizure activity at home.  He is admitted for further evaluation and management.  He was given IV valproic acid and is noted the patient has been bedbound since 2018.    During his hospital course he had poor p.o. intake and palliative care was consulted.  Patient's daughter made her father DNR and it was noted that the patient's living will documents that he does not want artificial means of feeding.  Initially after palliative care discussions family was inclined towards comfort care and hospice placement but then changed her mind and wanted aggressive care.  Patient was being worked up for SNF placement. Hospitalization further complicated by UTI klebsiella as well as downward trend in hgb without overt blood loss.   New events last 24 hours / Subjective: Notified by TOC that patient's insurance denied SNF placement.  Family planning to appeal decision.  Patient remains confused, he is alert but does not answer any questions or follows any commands  Assessment & Plan:   Principal Problem:   Seizure ([place]) Active Problems:   Hypertension   Hyperlipidemia   Diabetes ([place])   Breakthrough seizure ([place])   Pressure injury of skin   Breakthrough seizure -Likely secondary to medication noncompliance -Continue Depakote  -Appreciate neurology  Poor oral intake -Patient's living will states that patient does not wish for artificial nutrition -Appreciate SLP -Currently on dysphagia diet  Klebsiella UTI -Completed antibiotic therapy  Essential hypertension -Continue amlodipine  Hyperlipidemia -Continue Lipitor  Normocytic anemia, anemia of chronic disease -Baseline hemoglobin has ranged between 7-9 and had slow downward trend to 6.9.  No overt blood loss.  Status post 1 unit packed red blood cells on 1/4.  FOBT pending.  Hemoglobin stable at this time, continue to monitor  Advanced dementia -Appears to be his baseline.  Noted that patient has been bedbound since 2018   In agreement with assessment of the pressure ulcer as below:  Pressure Injury 12/05/20 Coccyx Medial Stage 2 -  Partial thickness loss of dermis presenting as a shallow open injury with a red, pink wound bed without slough. 88mm diameter open circular abrasion, top layer skin not intact, red and blanchable, superior (Active)  12/05/20 2145  Location: Coccyx  Location Orientation: Medial  Staging: Stage 2 -  Partial thickness loss of dermis presenting as a shallow open injury with a red, pink wound bed without slough.  Wound Description (  Comments): 62mm diameter open circular abrasion, top layer skin not intact, red and blanchable, superior to medial coccyx  Present on Admission: Yes    Severe protein calorie malnutrition Nutrition Problem: Inadequate oral intake Etiology: poor appetite Appreciate dietitian following   DVT  prophylaxis: SCDs   Code Status: DNR Family Communication: No family at bedside Disposition Plan:  Status is: Inpatient  Remains inpatient appropriate because:Unsafe d/c plan   Dispo: The patient is from: Home              Anticipated d/c is to: Home              Anticipated d/c date is: 1 day              Patient currently is medically stable to d/c.  Family is planning to appeal insurance decision regarding SNF denial.   Antimicrobials:  Anti-infectives (From admission, onward)   Start     Dose/Rate Route Frequency Ordered Stop   12/17/20 0900  cefTRIAXone (ROCEPHIN) 1 g in sodium chloride 0.9 % 100 mL IVPB        1 g 200 mL/hr over 30 Minutes Intravenous Daily 12/17/20 0823 12/21/20 1437        Objective: Vitals:   12/21/20 2354 12/22/20 0354 12/22/20 0740 12/22/20 1106  BP: 122/61 129/63 129/69 106/63  Pulse: 81 88 76 81  Resp: 18 18 18 18   Temp: 98.3 F (36.8 C) 98 F (36.7 C) 98.9 F (37.2 C) 98.1 F (36.7 C)  TempSrc: Oral Oral Oral Oral  SpO2: 99% 99% 100% 99%  Weight:      Height:        Intake/Output Summary (Last 24 hours) at 12/22/2020 1258 Last data filed at 12/22/2020 1049 Gross per 24 hour  Intake 800 ml  Output 2150 ml  Net -1350 ml   Filed Weights   12/04/20 2310 12/04/20 2312  Weight: 58.3 kg 58.3 kg    Examination:  General exam: Appears calm and comfortable  Respiratory system: Clear to auscultation. Respiratory effort normal.  Cardiovascular system: S1 & S2 heard, RRR. No murmurs. No pedal edema. Gastrointestinal system: Abdomen is nondistended, soft and nontender. Central nervous system: Alert, does not interact in meaningful way and does not answer any questions  Extremities: Symmetric in appearance    Data Reviewed: I have personally reviewed following labs and imaging studies  CBC: Recent Labs  Lab 12/18/20 0226 12/19/20 0310 12/20/20 0129 12/21/20 0613 12/21/20 1204 12/21/20 2052 12/22/20 0323  WBC 8.2 8.8 9.7 10.0   --   --  9.9  NEUTROABS 6.1 5.9 7.1 7.1  --   --  6.9  HGB 8.0* 7.6* 8.8* 7.2* 6.9* 8.5* 8.4*  HCT 24.1* 23.6* 27.8* 22.0* 21.7* 25.5* 25.6*  MCV 94.5 94.4 94.6 94.0  --   --  91.4  PLT 295 304 333 338  --   --  330   Basic Metabolic Panel: Recent Labs  Lab 12/18/20 0226 12/19/20 0310 12/20/20 0129 12/21/20 0613 12/22/20 0323  NA 132* 132* 132* 133* 134*  K 4.0 4.1 4.4 4.0 4.1  CL 95* 93* 93* 95* 96*  CO2 28 28 28 28 26   GLUCOSE 114* 98 116* 93 87  BUN 13 14 14 15 21   CREATININE 0.76 0.69 0.75 0.70 0.68  CALCIUM 8.2* 8.1* 8.2* 8.1* 7.9*  MG 1.5* 1.8 1.8 1.7 1.8  PHOS 2.8 2.6 2.6 3.5 2.9   GFR: Estimated Creatinine Clearance: 67.8 mL/min (by C-G formula based  on SCr of 0.68 mg/dL). Liver Function Tests: Recent Labs  Lab 12/18/20 0226 12/19/20 0310 12/20/20 0129 12/21/20 0613 12/22/20 0323  AST 25 36 47* 42* 46*  ALT 14 16 22 25  32  ALKPHOS 40 47 54 46 53  BILITOT 0.8 0.3 0.7 0.7 0.6  PROT 5.7* 5.6* 6.1* 5.4* 5.5*  ALBUMIN 2.2* 2.0* 2.2* 1.9* 1.8*   No results for input(s): LIPASE, AMYLASE in the last 168 hours. No results for input(s): AMMONIA in the last 168 hours. Coagulation Profile: No results for input(s): INR, PROTIME in the last 168 hours. Cardiac Enzymes: No results for input(s): CKTOTAL, CKMB, CKMBINDEX, TROPONINI in the last 168 hours. BNP (last 3 results) No results for input(s): PROBNP in the last 8760 hours. HbA1C: No results for input(s): HGBA1C in the last 72 hours. CBG: Recent Labs  Lab 12/19/20 1238 12/19/20 1639  GLUCAP 103* 106*   Lipid Profile: No results for input(s): CHOL, HDL, LDLCALC, TRIG, CHOLHDL, LDLDIRECT in the last 72 hours. Thyroid Function Tests: No results for input(s): TSH, T4TOTAL, FREET4, T3FREE, THYROIDAB in the last 72 hours. Anemia Panel: Recent Labs    12/21/20 1356  VITAMINB12 545  FOLATE 8.8  FERRITIN 1,081*  TIBC 143*  IRON 20*  RETICCTPCT 3.5*   Sepsis Labs: Recent Labs  Lab 12/17/20 0250   PROCALCITON <0.10  LATICACIDVEN 1.2    Recent Results (from the past 240 hour(s))  Culture, blood (routine x 2)     Status: None   Collection Time: 12/17/20  2:50 AM   Specimen: BLOOD  Result Value Ref Range Status   Specimen Description BLOOD RIGHT ANTECUBITAL  Final   Special Requests   Final    BOTTLES DRAWN AEROBIC AND ANAEROBIC Blood Culture adequate volume   Culture   Final    NO GROWTH 5 DAYS Performed at [facility], [address] [place], [place] [address]    Report Status 12/22/2020 FINAL  Final  Culture, blood (routine x 2)     Status: Abnormal   Collection Time: 12/17/20  3:01 AM   Specimen: BLOOD RIGHT ARM  Result Value Ref Range Status   Specimen Description BLOOD RIGHT ARM  Final   Special Requests AEROBIC BOTTLE ONLY Blood Culture adequate volume  Final   Culture  Setup Time   Final    GRAM POSITIVE COCCI IN CLUSTERS AEROBIC BOTTLE ONLY Organism ID to follow CRITICAL RESULT CALLED TO, READ BACK BY AND VERIFIED WITH: PHARMD M [place] [id number] MLM    Culture (A)  Final    STAPHYLOCOCCUS CAPITIS THE SIGNIFICANCE OF ISOLATING THIS ORGANISM FROM A SINGLE SET OF BLOOD CULTURES WHEN MULTIPLE SETS ARE DRAWN IS UNCERTAIN. PLEASE NOTIFY THE MICROBIOLOGY DEPARTMENT WITHIN ONE WEEK IF SPECIATION AND SENSITIVITIES ARE REQUIRED. Performed at [facility], [place] [address] [place], [place] [address]    Report Status 12/19/2020 FINAL  Final  Blood Culture ID Panel (Reflexed)     Status: Abnormal   Collection Time: 12/17/20  3:01 AM  Result Value Ref Range Status   Enterococcus faecalis NOT DETECTED NOT DETECTED Final   Enterococcus Faecium NOT DETECTED NOT DETECTED Final   Listeria monocytogenes NOT DETECTED NOT DETECTED Final   Staphylococcus species DETECTED (A) NOT DETECTED Final    Comment: CRITICAL RESULT CALLED TO, READ BACK BY AND VERIFIED WITH: PHARMD M MACCIA [id number] MLM    Staphylococcus aureus (BCID) NOT DETECTED NOT DETECTED Final    Staphylococcus epidermidis NOT DETECTED NOT DETECTED  Final   Staphylococcus lugdunensis NOT DETECTED NOT DETECTED Final   Streptococcus species NOT DETECTED NOT DETECTED Final   Streptococcus agalactiae NOT DETECTED NOT DETECTED Final   Streptococcus pneumoniae NOT DETECTED NOT DETECTED Final   Streptococcus pyogenes NOT DETECTED NOT DETECTED Final   A.calcoaceticus-baumannii NOT DETECTED NOT DETECTED Final   Bacteroides fragilis NOT DETECTED NOT DETECTED Final   Enterobacterales NOT DETECTED NOT DETECTED Final   Enterobacter cloacae complex NOT DETECTED NOT DETECTED Final   Escherichia coli NOT DETECTED NOT DETECTED Final   Klebsiella aerogenes NOT DETECTED NOT DETECTED Final   Klebsiella oxytoca NOT DETECTED NOT DETECTED Final   Klebsiella pneumoniae NOT DETECTED NOT DETECTED Final   Proteus species NOT DETECTED NOT DETECTED Final   Salmonella species NOT DETECTED NOT DETECTED Final   Serratia marcescens NOT DETECTED NOT DETECTED Final   Haemophilus influenzae NOT DETECTED NOT DETECTED Final   Neisseria meningitidis NOT DETECTED NOT DETECTED Final   Pseudomonas aeruginosa NOT DETECTED NOT DETECTED Final   Stenotrophomonas maltophilia NOT DETECTED NOT DETECTED Final   Candida albicans NOT DETECTED NOT DETECTED Final   Candida auris NOT DETECTED NOT DETECTED Final   Candida glabrata NOT DETECTED NOT DETECTED Final   Candida krusei NOT DETECTED NOT DETECTED Final   Candida parapsilosis NOT DETECTED NOT DETECTED Final   Candida tropicalis NOT DETECTED NOT DETECTED Final   Cryptococcus neoformans/gattii NOT DETECTED NOT DETECTED Final    Comment: Performed at [facility], [address], [place], [place] [address]  Culture, Urine     Status: Abnormal   Collection Time: 12/17/20  3:11 AM   Specimen: Urine, Random  Result Value Ref Range Status   Specimen Description URINE, RANDOM  Final   Special Requests   Final    NONE Performed at [facility], [place] [address]  [place], [place] [address]    Culture >=100,000 COLONIES/mL KLEBSIELLA PNEUMONIAE (A)  Final   Report Status 12/19/2020 FINAL  Final   Organism ID, Bacteria KLEBSIELLA PNEUMONIAE (A)  Final      Susceptibility   Klebsiella pneumoniae - MIC*    AMPICILLIN RESISTANT Resistant     CEFAZOLIN <=4 SENSITIVE Sensitive     CEFEPIME <=0.12 SENSITIVE Sensitive     CEFTRIAXONE <=0.25 SENSITIVE Sensitive     CIPROFLOXACIN <=0.25 SENSITIVE Sensitive     GENTAMICIN <=1 SENSITIVE Sensitive     IMIPENEM <=0.25 SENSITIVE Sensitive     NITROFURANTOIN <=16 SENSITIVE Sensitive     TRIMETH/SULFA <=20 SENSITIVE Sensitive     AMPICILLIN/SULBACTAM <=2 SENSITIVE Sensitive     PIP/TAZO <=4 SENSITIVE Sensitive     * >=100,000 COLONIES/mL KLEBSIELLA PNEUMONIAE  Resp Panel by RT-PCR (Flu A&B, Covid) Nasopharyngeal Swab     Status: None   Collection Time: 12/19/20  1:10 PM   Specimen: Nasopharyngeal Swab; Nasopharyngeal(NP) swabs in vial transport medium  Result Value Ref Range Status   SARS Coronavirus 2 by RT PCR NEGATIVE NEGATIVE Final    Comment: (NOTE) SARS-CoV-2 target nucleic acids are NOT DETECTED.  The SARS-CoV-2 RNA is generally detectable in upper respiratory specimens during the acute phase of infection. The lowest concentration of SARS-CoV-2 viral copies this assay can detect is 138 copies/mL. A negative result does not preclude SARS-Cov-2 infection and should not be used as the sole basis for treatment or other patient management decisions. A negative result may occur with  improper specimen collection/handling, submission of specimen other than nasopharyngeal swab, presence of viral mutation(s) within the areas  targeted by this assay, and inadequate number of viral copies(<138 copies/mL). A negative result must be combined with clinical observations, patient history, and epidemiological information. The expected result is Negative.  Fact Sheet for Patients:   EntrepreneurPulse.com.au  Fact Sheet for Healthcare Providers:  IncredibleEmployment.be  This test is no t yet approved or cleared by the [place] FDA and  has been authorized for detection and/or diagnosis of SARS-CoV-2 by FDA under an Emergency Use Authorization (EUA). This EUA will remain  in effect (meaning this test can be used) for the duration of the COVID-19 declaration under Section 564(b)(1) of the Act, 21 U.S.C.section 360bbb-3(b)(1), unless the authorization is terminated  or revoked sooner.       Influenza A by PCR NEGATIVE NEGATIVE Final   Influenza B by PCR NEGATIVE NEGATIVE Final    Comment: (NOTE) The Xpert Xpress SARS-CoV-2/FLU/RSV plus assay is intended as an aid in the diagnosis of influenza from Nasopharyngeal swab specimens and should not be used as a sole basis for treatment. Nasal washings and aspirates are unacceptable for Xpert Xpress SARS-CoV-2/FLU/RSV testing.  Fact Sheet for Patients: EntrepreneurPulse.com.au  Fact Sheet for Healthcare Providers: IncredibleEmployment.be  This test is not yet approved or cleared by the [place] FDA and has been authorized for detection and/or diagnosis of SARS-CoV-2 by FDA under an Emergency Use Authorization (EUA). This EUA will remain in effect (meaning this test can be used) for the duration of the COVID-19 declaration under Section 564(b)(1) of the Act, 21 U.S.C. section 360bbb-3(b)(1), unless the authorization is terminated or revoked.  Performed at [facility], [place] [address] [place], [place] [address]       Radiology Studies: No results found.    Scheduled Meds: . (feeding supplement) PROSource Plus  30 mL Oral BID BM  . amLODipine  2.5 mg Oral Daily  . atorvastatin  20 mg Oral QHS  . divalproex  500 mg Oral Q12H  . feeding supplement  237 mL Oral QID  . FLUoxetine  40 mg Oral Daily  . lubriderm seriously  sensitive   Topical Daily  . mirtazapine  30 mg Oral QHS  . multivitamin with minerals  1 tablet Oral Daily  . tamsulosin  0.4 mg Oral QHS   Continuous Infusions:   LOS: 17 days      Time spent: 30 minutes   Dessa Phi, DO [facility] 12/22/2020, 12:58 PM   Available via Epic secure chat 7am-7pm After these hours, please refer to coverage provider listed on [email or web address]

## 2020-12-22 NOTE — Progress Notes (Signed)
Calorie Count Note  48 hour calorie count ordered.  Diet: Dysphagia 1 with thin liquids Supplements: Magic Cup TID, Ensure Enlive QID, 25ml Prosource Plus po BID  Day 1 Results (12/20/20): Breakfast: 194 kcals, 9.5 grams protein Lunch: 0 kcals, 0 grams protein Dinner: 410 kcals, 16.7 grams protein Supplements: limited supplement documentation available in calorie count envelope. 1 Ensure documented on meal ticket, which provided 350 kcals and 13 grams protein  Total intake: 954 kcal (49% of minimum estimated needs)  39 grams protein (41% of minimum estimated needs)  Day 2 Results (12/21/20): Breakfast: 438 kcals, 14 grams protein Lunch: 455 kcals, 26.5 grams protein Dinner: no meal documentation available Supplements: 1600 kcals, 82 grams protein  Total intake: 2493 kcal (>100% of minimum estimated needs)  122.5 grams protein (>100% of minimum estimated needs)  Estimated Nutritional Needs:  Kcal:  1950-2150 Protein:  95-105 grams Fluid:  >1.95L/d  Dicussed pt with RN who reports pt does really well with supplements. RN states pt likes every supplement in every flavor.   Additionally, pt's appetite appears to be improving even more as he was noted to have eaten ~75% of his breakfast today, providing 570 kcals and 28.5 g protein. Unable to speak towards additional meal consumption today, but RN confirms pt consumed 100% of supplements today.   Nutrition Dx: Inadequate oral intake related to poor appetite as evidenced by meal completion < 50%  Goal: Patient will meet greater than or equal to 90% of their needs  Intervention:   D/c calorie count   Continue Magic cup TID with meals, each supplement provides 290 kcal and 9 grams of protein  Continue Ensure Enlive/Plus poQID, each supplement provides 350 kcal and 20 grams of protein (Ensure Plus provides only 13 grams protein)  Continue56ml Prosource Plus po BID, each supplement provides 100 kcals and 15 grams of  protein  Continue MVI daily  Continue to assist pt with meals   Eugene Gavia, MS, RD, LDN RD pager number and weekend/on-call pager number located in Amion.

## 2020-12-22 NOTE — Progress Notes (Signed)
Pt was denied for SNF rehab under his [employer] [place]. He or his family is able to appeal the decision if they desire. CM has attempted to reach pts sister to see if she wants to appeal or have the pt d/c home. CM called sister twice yesterday with voicemails left and no return call. CM spoke to sister this am and provided her the information she needs for the appeal. She is calling this am.  TOC following.

## 2020-12-23 DIAGNOSIS — R569 Unspecified convulsions: Secondary | ICD-10-CM | POA: Diagnosis not present

## 2020-12-23 LAB — CBC
HCT: 29.2 % — ABNORMAL LOW (ref 39.0–52.0)
Hemoglobin: 9.2 g/dL — ABNORMAL LOW (ref 13.0–17.0)
MCH: 29.5 pg (ref 26.0–34.0)
MCHC: 31.5 g/dL (ref 30.0–36.0)
MCV: 93.6 fL (ref 80.0–100.0)
Platelets: 394 10*3/uL (ref 150–400)
RBC: 3.12 MIL/uL — ABNORMAL LOW (ref 4.22–5.81)
RDW: 14.1 % (ref 11.5–15.5)
WBC: 11.4 10*3/uL — ABNORMAL HIGH (ref 4.0–10.5)
nRBC: 0 % (ref 0.0–0.2)

## 2020-12-23 LAB — BASIC METABOLIC PANEL
Anion gap: 13 (ref 5–15)
BUN: 24 mg/dL — ABNORMAL HIGH (ref 8–23)
CO2: 26 mmol/L (ref 22–32)
Calcium: 8.4 mg/dL — ABNORMAL LOW (ref 8.9–10.3)
Chloride: 94 mmol/L — ABNORMAL LOW (ref 98–111)
Creatinine, Ser: 0.65 mg/dL (ref 0.61–1.24)
GFR, Estimated: >60 mL/min (ref >60)
Glucose, Bld: 105 mg/dL — ABNORMAL HIGH (ref 70–99)
Potassium: 4 mmol/L (ref 3.5–5.1)
Sodium: 133 mmol/L — ABNORMAL LOW (ref 135–145)

## 2020-12-23 NOTE — Plan of Care (Signed)

## 2020-12-23 NOTE — Plan of Care (Signed)
Pt remains disoriented to time, situation and occasionally to place. Not progressing towards goals at this time due to ongoing confusion and altered mental status.   Problem: Education: Goal: Knowledge of General Education information will improve Description: Including pain rating scale, medication(s)/side effects and non-pharmacologic comfort measures Outcome: Not Progressing   Problem: Health Behavior/Discharge Planning: Goal: Ability to manage health-related needs will improve Outcome: Not Met (add Reason)   Problem: Clinical Measurements: Goal: Ability to maintain clinical measurements within normal limits will improve Outcome: Not Met (add Reason) Goal: Will remain free from infection Outcome: Not Met (add Reason) Goal: Diagnostic test results will improve Outcome: Not Met (add Reason) Goal: Respiratory complications will improve Outcome: Not Met (add Reason) Goal: Cardiovascular complication will be avoided Outcome: Not Met (add Reason)   Problem: Activity: Goal: Risk for activity intolerance will decrease Outcome: Not Met (add Reason)   Problem: Nutrition: Goal: Adequate nutrition will be maintained Outcome: Not Met (add Reason)   Problem: Elimination: Goal: Will not experience complications related to bowel motility Outcome: Not Met (add Reason) Goal: Will not experience complications related to urinary retention Outcome: Not Met (add Reason)   Problem: Pain Managment: Goal: General experience of comfort will improve Outcome: Not Met (add Reason)   Problem: Safety: Goal: Ability to remain free from injury will improve Outcome: Not Met (add Reason)   Problem: Education: Goal: Expressions of having a comfortable level of knowledge regarding the disease process will increase Outcome: Not Met (add Reason)   Problem: Skin Integrity: Goal: Risk for impaired skin integrity will decrease Outcome: Not Met (add Reason)

## 2020-12-23 NOTE — [place] FL2 (Signed)
[place] MEDICAID FL2 LEVEL OF CARE SCREENING TOOL     IDENTIFICATION  Patient Name: Brad [name] Birthdate: Jan 27, 1946 Sex: male Admission Date (Current Location): 12/04/2020  [place] Number:  Haynes Bast [id number] Facility and Address:  [facility], [address], [place], [place] [address]      Provider Number: [id number]  Attending Physician Name and Address:  Noralee Stain, DO  Relative Name and Phone Number:  Tonita Cong [phone]    Current Level of Care: Hospital Recommended Level of Care: Skilled Nursing Facility Prior Approval Number:    Date Approved/Denied:   PASRR Number:    Discharge Plan: SNF    Current Diagnoses: Patient Active Problem List   Diagnosis Date Noted  . Pressure injury of skin 12/06/2020  . Seizure (HCC) 12/05/2020  . Diabetes (HCC) 12/05/2020  . Breakthrough seizure (HCC) 12/05/2020  . Cervical spinal stenosis 12/14/2017  . Abnormal CT scan, neck 12/14/2017  . History of stroke 12/12/2017  . Hyperlipidemia 12/12/2017  . Seizure disorder (HCC) 12/12/2017  . KNEE PAIN, RIGHT 06/11/2009  . NECK PAIN 06/11/2009  . Hypertension 02/13/2008  . DENTAL CARIES 02/11/2008    Orientation RESPIRATION BLADDER Height & Weight      (not oriented)  Normal Incontinent Weight: 58.3 kg Height:  6\' 1"  (185.4 cm)  BEHAVIORAL SYMPTOMS/MOOD NEUROLOGICAL BOWEL NUTRITION STATUS    Convulsions/Seizures Incontinent Diet (DYS 1.  See discharge summary)  AMBULATORY STATUS COMMUNICATION OF NEEDS Skin   Total Care Does not communicate Other (Comment) (scratches)                       Personal Care Assistance Level of Assistance  Bathing,Feeding,Dressing Bathing Assistance: Maximum assistance Feeding assistance: Maximum assistance Dressing Assistance: Maximum assistance     Functional Limitations Info  Sight,Hearing,Speech Sight Info: Adequate Hearing Info: Adequate Speech Info: Impaired     SPECIAL CARE FACTORS FREQUENCY  PT (By licensed PT),OT (By licensed OT),Speech therapy     PT Frequency: 5x week OT Frequency: 5x week     Speech Therapy Frequency: 2x week      Contractures Contractures Info: Present    Additional Factors Info  Code Status,Allergies Code Status Info: DNR Allergies Info: NKA           Current Medications (12/23/2020):  This is the current hospital active medication list Current Facility-Administered Medications  Medication Dose Route Frequency Provider Last Rate Last Admin  . (feeding supplement) PROSource Plus liquid 30 mL  30 mL Oral BID BM Pahwani, Rinka R, MD   30 mL at 12/23/20 1026  . acetaminophen (TYLENOL) suppository 650 mg  650 mg Rectal Q6H PRN Opyd, 02/20/21, MD   650 mg at 12/17/20 1643  . acetaminophen (TYLENOL) tablet 650 mg  650 mg Oral Q6H PRN 12/19/20, MD   650 mg at 12/23/20 0604  . amLODipine (NORVASC) tablet 2.5 mg  2.5 mg Oral Daily Pahwani, Rinka R, MD   2.5 mg at 12/23/20 1014  . atorvastatin (LIPITOR) tablet 20 mg  20 mg Oral QHS Pahwani, Rinka R, MD   20 mg at 12/22/20 2149  . divalproex (DEPAKOTE SPRINKLE) capsule 500 mg  500 mg Oral Q12H 2150, RPH   500 mg at 12/23/20 1014  . feeding supplement (ENSURE ENLIVE / ENSURE PLUS) liquid 237 mL  237 mL Oral QID 02/20/21 [place], DO   237 mL at 12/22/20 2149  . FLUoxetine (PROZAC) capsule 40  mg  40 mg Oral Daily Pahwani, Rinka R, MD   40 mg at 12/23/20 1014  . hydrALAZINE (APRESOLINE) injection 10 mg  10 mg Intravenous Q4H PRN Shela Leff, MD   10 mg at 12/17/20 0852  . LORazepam (ATIVAN) injection 2 mg  2 mg Intravenous Once PRN Shela Leff, MD      . lubriderm seriously sensitive lotion   Topical Daily Hammons, Theone Murdoch, RPH   Given at 12/22/20 1351  . mirtazapine (REMERON) tablet 30 mg  30 mg Oral QHS Pahwani, Rinka R, MD   30 mg at 12/22/20 2149  . morphine CONCENTRATE 10 MG/0.5ML oral solution 5 mg  5 mg Sublingual Q3H PRN  Elie Confer B, NP   5 mg at 12/18/20 0102  . multivitamin with minerals tablet 1 tablet  1 tablet Oral Daily Pahwani, Rinka R, MD   1 tablet at 12/23/20 1014  . tamsulosin (FLOMAX) capsule 0.4 mg  0.4 mg Oral QHS Pahwani, Rinka R, MD   0.4 mg at 12/22/20 2149  . temazepam (RESTORIL) capsule 7.5 mg  7.5 mg Oral QHS PRN Swayze, Ava, DO         Discharge Medications: Please see discharge summary for a list of discharge medications.  Relevant Imaging Results:  Relevant Lab Results:   Additional Information SSN [id number] will come under his medicaid as LTC.  Pollie Friar, RN

## 2020-12-23 NOTE — Progress Notes (Signed)
CM and pts sister have been unable to reach anyone through [facility] that can begin the expedited appeal after several phone calls. Sister in agreement to having pt faxed out under his medicaid. TOC following.

## 2020-12-23 NOTE — Progress Notes (Signed)
PROGRESS NOTE    Brad [name]  UXL:[id number] DOB: 28-Jul-1946 DOA: 12/04/2020 PCP: Karl Ito, DO     Brief Narrative:  Brad [name] is a 75 year old elderly African-American male with a past medical history significant for but not limited to history of prostate cancer, depression, type 2 diabetes mellitus, hypertension, hyperlipidemia, history of seizure disorder, history of substance abuse as well as other comorbidities who presented to the emergency room with altered mental status.  Upon arrival to the ED he is noted to be hypertensive with a systolic blood pressure 190s but the rest of his vital signs were stable.  In the ED his white blood cell count was 9.9 and blood glucose was 75.  Head CT given his altered mental status and it showed no acute intracranial abnormality.  CTA of the head neck was done as well.  He had a normal CT perfusion study and an unchanged 60% stenosis of the proximal left internal carotid artery secondary to mixed density atherosclerosis.  He is seen by neurology after speaking with family, the patient's presentation was felt to be secondary to seizure activity at home.  He is admitted for further evaluation and management.  He was given IV valproic acid and is noted the patient has been bedbound since 2018.    During his hospital course he had poor p.o. intake and palliative care was consulted.  Patient's daughter made her father DNR and it was noted that the patient's living Brad documents that he does not want artificial means of feeding.  Initially after palliative care discussions family was inclined towards comfort care and hospice placement but then changed her mind and wanted aggressive care.  Patient was being worked up for SNF placement. Hospitalization further complicated by UTI klebsiella as well as downward trend in hgb without overt blood loss.   Notified by TOC that patient's insurance denied SNF placement. Family planning to appeal decision.     New events last 24 hours / Subjective: Patient was able to eat much more yesterday, >100% of minimal estimated needs documented by dietitian.  Patient remains confused, poor historian overall.  No acute events reported overnight.  Assessment & Plan:   Principal Problem:   Seizure (HCC) Active Problems:   Hypertension   Hyperlipidemia   Diabetes (HCC)   Breakthrough seizure (HCC)   Pressure injury of skin   Breakthrough seizure -Likely secondary to medication noncompliance -Continue Depakote  -Appreciate neurology  Poor oral intake -Patient's living Brad states that patient does not wish for artificial nutrition -Appreciate SLP -Currently on dysphagia diet  Klebsiella UTI -Completed antibiotic therapy  Essential hypertension -Continue amlodipine  Hyperlipidemia -Continue Lipitor  Normocytic anemia, anemia of chronic disease -Baseline hemoglobin has ranged between 7-9 and had slow downward trend to 6.9.  No overt blood loss.  Status post 1 unit packed red blood cells on 1/4.  FOBT pending.  Hemoglobin stable at this time, continue to monitor  Advanced dementia -Appears to be his baseline.  Noted that patient has been bedbound since 2018   In agreement with assessment of the pressure ulcer as below:  Pressure Injury 12/05/20 Coccyx Medial Stage 2 -  Partial thickness loss of dermis presenting as a shallow open injury with a red, pink wound bed without slough. 26mm diameter open circular abrasion, top layer skin not intact, red and blanchable, superior (Active)  12/05/20 2145  Location: Coccyx  Location Orientation: Medial  Staging: Stage 2 -  Partial thickness loss of dermis presenting as  a shallow open injury with a red, pink wound bed without slough.  Wound Description (Comments): 37mm diameter open circular abrasion, top layer skin not intact, red and blanchable, superior to medial coccyx  Present on Admission: Yes     Pressure Injury 12/22/20 Ankle Anterior;Right  Deep Tissue Pressure Injury - Purple or maroon localized area of discolored intact skin or blood-filled blister due to damage of underlying soft tissue from pressure and/or shear. purple ecchymosis 1.8x1.5cm  (Active)  12/22/20 1720  Location: Ankle  Location Orientation: Anterior;Right  Staging: Deep Tissue Pressure Injury - Purple or maroon localized area of discolored intact skin or blood-filled blister due to damage of underlying soft tissue from pressure and/or shear.  Wound Description (Comments): purple ecchymosis 1.8x1.5cm near maleolus  Present on Admission: No     Pressure Injury 12/22/20 Heel Left Deep Tissue Pressure Injury - Purple or maroon localized area of discolored intact skin or blood-filled blister due to damage of underlying soft tissue from pressure and/or shear. 9x6cm dark pink/ red area across heel (Active)  12/22/20 1720  Location: Heel  Location Orientation: Left  Staging: Deep Tissue Pressure Injury - Purple or maroon localized area of discolored intact skin or blood-filled blister due to damage of underlying soft tissue from pressure and/or shear.  Wound Description (Comments): 9x6cm dark pink/ red area across heel  Present on Admission: No     Pressure Injury 12/22/20 Heel Right Deep Tissue Pressure Injury - Purple or maroon localized area of discolored intact skin or blood-filled blister due to damage of underlying soft tissue from pressure and/or shear. 4x7cm dark purple area across heel (Active)  12/22/20 1720  Location: Heel  Location Orientation: Right  Staging: Deep Tissue Pressure Injury - Purple or maroon localized area of discolored intact skin or blood-filled blister due to damage of underlying soft tissue from pressure and/or shear.  Wound Description (Comments): 4x7cm dark purple area across heel  Present on Admission: No    Severe protein calorie malnutrition Nutrition Problem: Inadequate oral intake Etiology: poor appetite Appreciate dietitian  following   DVT prophylaxis: SCDs Place and maintain sequential compression device Start: 12/22/20 1315  Code Status: DNR Family Communication: No family at bedside Disposition Plan:  Status is: Inpatient  Remains inpatient appropriate because:Unsafe d/c plan   Dispo: The patient is from: Home              Anticipated d/c is to: Home              Anticipated d/c date is: 1 day              Patient currently is medically stable to d/c.  Family is planning to appeal insurance decision regarding SNF denial.   Antimicrobials:  Anti-infectives (From admission, onward)   Start     Dose/Rate Route Frequency Ordered Stop   12/17/20 0900  cefTRIAXone (ROCEPHIN) 1 g in sodium chloride 0.9 % 100 mL IVPB        1 g 200 mL/hr over 30 Minutes Intravenous Daily 12/17/20 0823 12/21/20 1437       Objective: Vitals:   12/22/20 2337 12/23/20 0334 12/23/20 0925 12/23/20 0925  BP: 131/70 119/68 (!) 150/93 (!) 150/93  Pulse: 79 81 72 68  Resp: 18 18 16 16   Temp: 98 F (36.7 C) 97.9 F (36.6 C) 97.6 F (36.4 C) 97.6 F (36.4 C)  TempSrc: Axillary Axillary Oral Oral  SpO2: 100% 100% 100% 100%  Weight:      Height:  Intake/Output Summary (Last 24 hours) at 12/23/2020 1154 Last data filed at 12/23/2020 0335 Gross per 24 hour  Intake 480 ml  Output 900 ml  Net -420 ml   Filed Weights   12/04/20 2310 12/04/20 2312  Weight: 58.3 kg 58.3 kg    Examination: General exam: Appears calm and comfortable  Respiratory system: Clear to auscultation. Respiratory effort normal. Cardiovascular system: S1 & S2 heard, RRR. No pedal edema. Gastrointestinal system: Abdomen is nondistended, soft and nontender. Normal bowel sounds heard. Central nervous system: Alert  Psychiatry: Dementia  Data Reviewed: I have personally reviewed following labs and imaging studies  CBC: Recent Labs  Lab 12/18/20 0226 12/19/20 0310 12/20/20 0129 12/21/20 [id number] 12/21/20 1204 12/21/20 2052 12/22/20 0323  12/23/20 0254  WBC 8.2 8.8 9.7 10.0  --   --  9.9 11.4*  NEUTROABS 6.1 5.9 7.1 7.1  --   --  6.9  --   HGB 8.0* 7.6* 8.8* 7.2* 6.9* 8.5* 8.4* 9.2*  HCT 24.1* 23.6* 27.8* 22.0* 21.7* 25.5* 25.6* 29.2*  MCV 94.5 94.4 94.6 94.0  --   --  91.4 93.6  PLT 295 304 333 338  --   --  330 [id number]   Basic Metabolic Panel: Recent Labs  Lab 12/18/20 0226 12/19/20 0310 12/20/20 0129 12/21/20 0613 12/22/20 0323 12/23/20 0254  NA 132* 132* 132* 133* 134* 133*  K 4.0 4.1 4.4 4.0 4.1 4.0  CL 95* 93* 93* 95* 96* 94*  CO2 28 28 28 28 26 26   GLUCOSE 114* 98 116* 93 87 105*  BUN 13 14 14 15 21  24*  CREATININE 0.76 0.69 0.75 0.70 0.68 0.65  CALCIUM 8.2* 8.1* 8.2* 8.1* 7.9* 8.4*  MG 1.5* 1.8 1.8 1.7 1.8  --   PHOS 2.8 2.6 2.6 3.5 2.9  --    GFR: Estimated Creatinine Clearance: 67.8 mL/min (by C-G formula based on SCr of 0.65 mg/dL). Liver Function Tests: Recent Labs  Lab 12/18/20 0226 12/19/20 0310 12/20/20 0129 12/21/20 0613 12/22/20 0323  AST 25 36 47* 42* 46*  ALT 14 16 22 25  32  ALKPHOS 40 47 54 46 53  BILITOT 0.8 0.3 0.7 0.7 0.6  PROT 5.7* 5.6* 6.1* 5.4* 5.5*  ALBUMIN 2.2* 2.0* 2.2* 1.9* 1.8*   No results for input(s): LIPASE, AMYLASE in the last 168 hours. No results for input(s): AMMONIA in the last 168 hours. Coagulation Profile: No results for input(s): INR, PROTIME in the last 168 hours. Cardiac Enzymes: No results for input(s): CKTOTAL, CKMB, CKMBINDEX, TROPONINI in the last 168 hours. BNP (last 3 results) No results for input(s): PROBNP in the last 8760 hours. HbA1C: No results for input(s): HGBA1C in the last 72 hours. CBG: Recent Labs  Lab 12/19/20 1238 12/19/20 1639  GLUCAP 103* 106*   Lipid Profile: No results for input(s): CHOL, HDL, LDLCALC, TRIG, CHOLHDL, LDLDIRECT in the last 72 hours. Thyroid Function Tests: No results for input(s): TSH, T4TOTAL, FREET4, T3FREE, THYROIDAB in the last 72 hours. Anemia Panel: Recent Labs    12/21/20 1356  VITAMINB12 545   FOLATE 8.8  FERRITIN 1,081*  TIBC 143*  IRON 20*  RETICCTPCT 3.5*   Sepsis Labs: Recent Labs  Lab 12/17/20 0250  PROCALCITON <0.10  LATICACIDVEN 1.2    Recent Results (from the past 240 hour(s))  Culture, blood (routine x 2)     Status: None   Collection Time: 12/17/20  2:50 AM   Specimen: BLOOD  Result Value Ref Range Status   Specimen  Description BLOOD RIGHT ANTECUBITAL  Final   Special Requests   Final    BOTTLES DRAWN AEROBIC AND ANAEROBIC Blood Culture adequate volume   Culture   Final    NO GROWTH 5 DAYS Performed at [facility], [address], [place], [place] [address]    Report Status 12/22/2020 FINAL  Final  Culture, blood (routine x 2)     Status: Abnormal   Collection Time: 12/17/20  3:01 AM   Specimen: BLOOD RIGHT ARM  Result Value Ref Range Status   Specimen Description BLOOD RIGHT ARM  Final   Special Requests AEROBIC BOTTLE ONLY Blood Culture adequate volume  Final   Culture  Setup Time   Final    GRAM POSITIVE COCCI IN CLUSTERS AEROBIC BOTTLE ONLY Organism ID to follow CRITICAL RESULT CALLED TO, READ BACK BY AND VERIFIED WITH: PHARMD M [place] [id number] MLM    Culture (A)  Final    STAPHYLOCOCCUS CAPITIS THE SIGNIFICANCE OF ISOLATING THIS ORGANISM FROM A SINGLE SET OF BLOOD CULTURES WHEN MULTIPLE SETS ARE DRAWN IS UNCERTAIN. PLEASE NOTIFY THE MICROBIOLOGY DEPARTMENT WITHIN ONE WEEK IF SPECIATION AND SENSITIVITIES ARE REQUIRED. Performed at [facility], [place] [address] [place], [place] [address]    Report Status 12/19/2020 FINAL  Final  Blood Culture ID Panel (Reflexed)     Status: Abnormal   Collection Time: 12/17/20  3:01 AM  Result Value Ref Range Status   Enterococcus faecalis NOT DETECTED NOT DETECTED Final   Enterococcus Faecium NOT DETECTED NOT DETECTED Final   Listeria monocytogenes NOT DETECTED NOT DETECTED Final   Staphylococcus species DETECTED (A) NOT DETECTED Final    Comment: CRITICAL RESULT CALLED TO, READ BACK BY  AND VERIFIED WITH: PHARMD M [place]ACC[place] [id number] MLM    Staphylococcus aureus (BCID) NOT DETECTED NOT DETECTED Final   Staphylococcus epidermidis NOT DETECTED NOT DETECTED Final   Staphylococcus lugdunensis NOT DETECTED NOT DETECTED Final   Streptococcus species NOT DETECTED NOT DETECTED Final   Streptococcus agalactiae NOT DETECTED NOT DETECTED Final   Streptococcus pneumoniae NOT DETECTED NOT DETECTED Final   Streptococcus pyogenes NOT DETECTED NOT DETECTED Final   A.calcoaceticus-baumannii NOT DETECTED NOT DETECTED Final   Bacteroides fragilis NOT DETECTED NOT DETECTED Final   Enterobacterales NOT DETECTED NOT DETECTED Final   Enterobacter cloacae complex NOT DETECTED NOT DETECTED Final   Escherichia coli NOT DETECTED NOT DETECTED Final   Klebsiella aerogenes NOT DETECTED NOT DETECTED Final   Klebsiella oxytoca NOT DETECTED NOT DETECTED Final   Klebsiella pneumoniae NOT DETECTED NOT DETECTED Final   Proteus species NOT DETECTED NOT DETECTED Final   Salmonella species NOT DETECTED NOT DETECTED Final   Serratia marcescens NOT DETECTED NOT DETECTED Final   Haemophilus influenzae NOT DETECTED NOT DETECTED Final   Neisseria meningitidis NOT DETECTED NOT DETECTED Final   Pseudomonas aeruginosa NOT DETECTED NOT DETECTED Final   Stenotrophomonas maltophilia NOT DETECTED NOT DETECTED Final   Candida albicans NOT DETECTED NOT DETECTED Final   Candida auris NOT DETECTED NOT DETECTED Final   Candida glabrata NOT DETECTED NOT DETECTED Final   Candida krusei NOT DETECTED NOT DETECTED Final   Candida parapsilosis NOT DETECTED NOT DETECTED Final   Candida tropicalis NOT DETECTED NOT DETECTED Final   Cryptococcus neoformans/gattii NOT DETECTED NOT DETECTED Final    Comment: Performed at [facility], [address] [place], [place] [address]  Culture, Urine     Status: Abnormal   Collection Time: 12/17/20  3:11  AM   Specimen: Urine, Random  Result Value Ref Range Status   Specimen  Description URINE, RANDOM  Final   Special Requests   Final    NONE Performed at [facility], [address] [place],  [address]    Culture >=100,000 COLONIES/mL KLEBSIELLA PNEUMONIAE (A)  Final   Report Status 12/19/2020 FINAL  Final   Organism ID, Bacteria KLEBSIELLA PNEUMONIAE (A)  Final      Susceptibility   Klebsiella pneumoniae - MIC*    AMPICILLIN RESISTANT Resistant     CEFAZOLIN <=4 SENSITIVE Sensitive     CEFEPIME <=0.12 SENSITIVE Sensitive     CEFTRIAXONE <=0.25 SENSITIVE Sensitive     CIPROFLOXACIN <=0.25 SENSITIVE Sensitive     GENTAMICIN <=1 SENSITIVE Sensitive     IMIPENEM <=0.25 SENSITIVE Sensitive     NITROFURANTOIN <=16 SENSITIVE Sensitive     TRIMETH/SULFA <=20 SENSITIVE Sensitive     AMPICILLIN/SULBACTAM <=2 SENSITIVE Sensitive     PIP/TAZO <=4 SENSITIVE Sensitive     * >=100,000 COLONIES/mL KLEBSIELLA PNEUMONIAE  Resp Panel by RT-PCR (Flu A&B, Covid) Nasopharyngeal Swab     Status: None   Collection Time: 12/19/20  1:10 PM   Specimen: Nasopharyngeal Swab; Nasopharyngeal(NP) swabs in vial transport medium  Result Value Ref Range Status   SARS Coronavirus 2 by RT PCR NEGATIVE NEGATIVE Final    Comment: (NOTE) SARS-CoV-2 target nucleic acids are NOT DETECTED.  The SARS-CoV-2 RNA is generally detectable in upper respiratory specimens during the acute phase of infection. The lowest concentration of SARS-CoV-2 viral copies this assay can detect is 138 copies/mL. A negative result does not preclude SARS-Cov-2 infection and should not be used as the sole basis for treatment or other patient management decisions. A negative result may occur with  improper specimen collection/handling, submission of specimen other than nasopharyngeal swab, presence of viral mutation(s) within the areas targeted by this assay, and inadequate number of viral copies(<138 copies/mL). A negative result must be combined with clinical observations, patient history, and  epidemiological information. The expected result is Negative.  Fact Sheet for Patients:  EntrepreneurPulse.com.au  Fact Sheet for Healthcare Providers:  IncredibleEmployment.be  This test is no t yet approved or cleared by the [place] FDA and  has been authorized for detection and/or diagnosis of SARS-CoV-2 by FDA under an Emergency Use Authorization (EUA). This EUA Brad remain  in effect (meaning this test can be used) for the duration of the COVID-19 declaration under Section 564(b)(1) of the Act, 21 U.S.C.section 360bbb-3(b)(1), unless the authorization is terminated  or revoked sooner.       Influenza A by PCR NEGATIVE NEGATIVE Final   Influenza B by PCR NEGATIVE NEGATIVE Final    Comment: (NOTE) The Xpert Xpress SARS-CoV-2/FLU/RSV plus assay is intended as an aid in the diagnosis of influenza from Nasopharyngeal swab specimens and should not be used as a sole basis for treatment. Nasal washings and aspirates are unacceptable for Xpert Xpress SARS-CoV-2/FLU/RSV testing.  Fact Sheet for Patients: EntrepreneurPulse.com.au  Fact Sheet for Healthcare Providers: IncredibleEmployment.be  This test is not yet approved or cleared by the [place] FDA and has been authorized for detection and/or diagnosis of SARS-CoV-2 by FDA under an Emergency Use Authorization (EUA). This EUA Brad remain in effect (meaning this test can be used) for the duration of the COVID-19 declaration under Section 564(b)(1) of the Act, 21 U.S.C. section 360bbb-3(b)(1), unless the authorization is terminated or revoked.  Performed at [facility], [address] [place], [place] [address]       Radiology Studies: No results found.    Scheduled Meds: . (feeding supplement) PROSource Plus  30 mL Oral BID BM  . amLODipine  2.5 mg Oral Daily  . atorvastatin  20 mg Oral QHS  . divalproex  500 mg Oral Q12H  .  feeding supplement  237 mL Oral QID  . FLUoxetine  40 mg Oral Daily  . lubriderm seriously sensitive   Topical Daily  . mirtazapine  30 mg Oral QHS  . multivitamin with minerals  1 tablet Oral Daily  . tamsulosin  0.4 mg Oral QHS   Continuous Infusions:   LOS: 18 days      Time spent: 15 minutes   Dessa Phi, DO [facility] 12/23/2020, 11:54 AM   Available via Epic secure chat 7am-7pm After these hours, please refer to coverage provider listed on [email or web address]

## 2020-12-24 DIAGNOSIS — R569 Unspecified convulsions: Secondary | ICD-10-CM | POA: Diagnosis not present

## 2020-12-24 LAB — CBC
HCT: 30.2 % — ABNORMAL LOW (ref 39.0–52.0)
Hemoglobin: 9.9 g/dL — ABNORMAL LOW (ref 13.0–17.0)
MCH: 30.4 pg (ref 26.0–34.0)
MCHC: 32.8 g/dL (ref 30.0–36.0)
MCV: 92.6 fL (ref 80.0–100.0)
Platelets: 454 10*3/uL — ABNORMAL HIGH (ref 150–400)
RBC: 3.26 MIL/uL — ABNORMAL LOW (ref 4.22–5.81)
RDW: 13.9 % (ref 11.5–15.5)
WBC: 12.3 10*3/uL — ABNORMAL HIGH (ref 4.0–10.5)
nRBC: 0 % (ref 0.0–0.2)

## 2020-12-24 MED ORDER — SODIUM CHLORIDE 0.9 % IV SOLN: INTRAVENOUS | Status: AC

## 2020-12-24 NOTE — Plan of Care (Signed)
  Problem: Education: Goal: Knowledge of General Education information will improve Description: Including pain rating scale, medication(s)/side effects and non-pharmacologic comfort measures Outcome: Progressing   Problem: Health Behavior/Discharge Planning: Goal: Ability to manage health-related needs will improve Outcome: Progressing   Problem: Clinical Measurements: Goal: Ability to maintain clinical measurements within normal limits will improve Outcome: Progressing Goal: Will remain free from infection Outcome: Progressing Goal: Diagnostic test results will improve Outcome: Progressing Goal: Respiratory complications will improve Outcome: Progressing Goal: Cardiovascular complication will be avoided Outcome: Progressing   Problem: Activity: Goal: Risk for activity intolerance will decrease Outcome: Progressing   Problem: Nutrition: Goal: Adequate nutrition will be maintained Outcome: Progressing   Problem: Coping: Goal: Level of anxiety will decrease Outcome: Progressing   Problem: Elimination: Goal: Will not experience complications related to bowel motility Outcome: Progressing Goal: Will not experience complications related to urinary retention Outcome: Progressing   Problem: Pain Managment: Goal: General experience of comfort will improve Outcome: Progressing   Problem: Safety: Goal: Ability to remain free from injury will improve Outcome: Progressing   Problem: Skin Integrity: Goal: Risk for impaired skin integrity will decrease Outcome: Progressing   Problem: Education: Goal: Expressions of having a comfortable level of knowledge regarding the disease process will increase 12/24/2020 0320 by Lennox Grumbles, RN Outcome: Progressing 12/24/2020 0320 by Lennox Grumbles, RN Outcome: Progressing   Problem: Coping: Goal: Ability to adjust to condition or change in health will improve 12/24/2020 0320 by Lennox Grumbles, RN Outcome: Progressing 12/24/2020 0320  by Lennox Grumbles, RN Outcome: Progressing Goal: Ability to identify appropriate support needs will improve 12/24/2020 0320 by Lennox Grumbles, RN Outcome: Progressing 12/24/2020 0320 by Lennox Grumbles, RN Outcome: Progressing   Problem: Health Behavior/Discharge Planning: Goal: Compliance with prescribed medication regimen will improve 12/24/2020 0320 by Lennox Grumbles, RN Outcome: Progressing 12/24/2020 0320 by Lennox Grumbles, RN Outcome: Progressing   Problem: Medication: Goal: Risk for medication side effects will decrease 12/24/2020 0320 by Lennox Grumbles, RN Outcome: Progressing 12/24/2020 0320 by Lennox Grumbles, RN Outcome: Progressing   Problem: Clinical Measurements: Goal: Complications related to the disease process, condition or treatment will be avoided or minimized 12/24/2020 0320 by Lennox Grumbles, RN Outcome: Progressing 12/24/2020 0320 by Lennox Grumbles, RN Outcome: Progressing Goal: Diagnostic test results will improve 12/24/2020 0320 by Lennox Grumbles, RN Outcome: Progressing 12/24/2020 0320 by Lennox Grumbles, RN Outcome: Progressing   Problem: Safety: Goal: Verbalization of understanding the information provided will improve 12/24/2020 0320 by Lennox Grumbles, RN Outcome: Progressing 12/24/2020 0320 by Lennox Grumbles, RN Outcome: Progressing   Problem: Self-Concept: Goal: Level of anxiety will decrease 12/24/2020 0320 by Lennox Grumbles, RN Outcome: Progressing 12/24/2020 0320 by Lennox Grumbles, RN Outcome: Progressing Goal: Ability to verbalize feelings about condition will improve 12/24/2020 0320 by Lennox Grumbles, RN Outcome: Progressing 12/24/2020 0320 by Lennox Grumbles, RN Outcome: Progressing

## 2020-12-24 NOTE — Progress Notes (Signed)
PROGRESS NOTE    Brad [name]  [id number] DOB: [birth year] DOA: 12/04/2020 PCP: [name], DO     Brief Narrative:  Brad [name] is a 75 year old elderly African-American male with a past medical history significant for but not limited to history of prostate cancer, depression, type 2 diabetes mellitus, hypertension, hyperlipidemia, history of seizure disorder, history of substance abuse as well as other comorbidities who presented to the emergency room with altered mental status.  Upon arrival to the ED he is noted to be hypertensive with a systolic blood pressure [id number] but the rest of his vital signs were stable.  In the ED his white blood cell count was 9.9 and blood glucose was 75.  Head CT given his altered mental status and it showed no acute intracranial abnormality.  CTA of the head neck was done as well.  He had a normal CT perfusion study and an unchanged 60% stenosis of the proximal left internal carotid artery secondary to mixed density atherosclerosis.  He is seen by neurology after speaking with family, the patient's presentation was felt to be secondary to seizure activity at home.  He is admitted for further evaluation and management.  He was given IV valproic acid and is noted the patient has been bedbound since 2018.    During his hospital course he had poor p.o. intake and palliative care was consulted.  Patient's daughter made her father DNR and it was noted that the patient's living will documents that he does not want artificial means of feeding.  Initially after palliative care discussions family was inclined towards comfort care and hospice placement but then changed her mind and wanted aggressive care.  Patient was being worked up for SNF placement. Hospitalization further complicated by UTI klebsiella as well as downward trend in hgb without overt blood loss.   Notified by TOC that patient's insurance denied SNF placement. Family planning to appeal decision.     New events last 24 hours / Subjective: More alert and interactive this morning. Able to tell me that he ate more yesterday, although still remains poor historian with history of dementia.   Assessment & Plan:   Principal Problem:   Seizure ([place]) Active Problems:   Hypertension   Hyperlipidemia   Diabetes ([place])   Breakthrough seizure ([place])   Pressure injury of skin   Breakthrough seizure -Likely secondary to medication noncompliance -Continue Depakote  -Appreciate neurology  Poor oral intake -Patient's living will states that patient does not wish for artificial nutrition -Appreciate SLP -Currently on dysphagia diet  Klebsiella UTI -Completed antibiotic therapy  Essential hypertension -Continue amlodipine  Hyperlipidemia -Continue Lipitor  Normocytic anemia, anemia of chronic disease -Baseline hemoglobin has ranged between 7-9 and had slow downward trend to 6.9.  No overt blood loss.  Status post 1 unit packed red blood cells on 1/4.  FOBT pending.  Hemoglobin stable at this time, continue to monitor  Advanced dementia -Appears to be his baseline.  Noted that patient has been bedbound since 2018  Leukocytosis -Unclear if this is infectious process. He is afebrile without specific complaints. ?Hemoconcentration as hgb and platelets also increased. Will give 1 day of IVF and monitor.    In agreement with assessment of the pressure ulcer as below:  Pressure Injury 12/05/20 Coccyx Medial Stage 2 -  Partial thickness loss of dermis presenting as a shallow open injury with a red, pink wound bed without slough. 24mm diameter open circular abrasion, top layer skin not intact, red and  blanchable, superior (Active)  12/05/20 2145  Location: Coccyx  Location Orientation: Medial  Staging: Stage 2 -  Partial thickness loss of dermis presenting as a shallow open injury with a red, pink wound bed without slough.  Wound Description (Comments): 50mm diameter open circular abrasion,  top layer skin not intact, red and blanchable, superior to medial coccyx  Present on Admission: Yes     Pressure Injury 12/22/20 Ankle Anterior;Right Deep Tissue Pressure Injury - Purple or maroon localized area of discolored intact skin or blood-filled blister due to damage of underlying soft tissue from pressure and/or shear. purple ecchymosis 1.8x1.5cm  (Active)  12/22/20 1720  Location: Ankle  Location Orientation: Anterior;Right  Staging: Deep Tissue Pressure Injury - Purple or maroon localized area of discolored intact skin or blood-filled blister due to damage of underlying soft tissue from pressure and/or shear.  Wound Description (Comments): purple ecchymosis 1.8x1.5cm near maleolus  Present on Admission: No     Pressure Injury 12/22/20 Heel Left Deep Tissue Pressure Injury - Purple or maroon localized area of discolored intact skin or blood-filled blister due to damage of underlying soft tissue from pressure and/or shear. 9x6cm dark pink/ red area across heel (Active)  12/22/20 1720  Location: Heel  Location Orientation: Left  Staging: Deep Tissue Pressure Injury - Purple or maroon localized area of discolored intact skin or blood-filled blister due to damage of underlying soft tissue from pressure and/or shear.  Wound Description (Comments): 9x6cm dark pink/ red area across heel  Present on Admission: No     Pressure Injury 12/22/20 Heel Right Deep Tissue Pressure Injury - Purple or maroon localized area of discolored intact skin or blood-filled blister due to damage of underlying soft tissue from pressure and/or shear. 4x7cm dark purple area across heel (Active)  12/22/20 1720  Location: Heel  Location Orientation: Right  Staging: Deep Tissue Pressure Injury - Purple or maroon localized area of discolored intact skin or blood-filled blister due to damage of underlying soft tissue from pressure and/or shear.  Wound Description (Comments): 4x7cm dark purple area across heel   Present on Admission: No    Severe protein calorie malnutrition Nutrition Problem: Inadequate oral intake Etiology: poor appetite Appreciate dietitian following   DVT prophylaxis: SCDs Place and maintain sequential compression device Start: 12/22/20 1315  Code Status: DNR Family Communication: No family at bedside Disposition Plan:  Status is: Inpatient  Remains inpatient appropriate because:Unsafe d/c plan   Dispo: The patient is from: Home              Anticipated d/c is to: Home              Anticipated d/c date is: 1 day              Patient currently is medically stable to d/c.  Family is planning to appeal insurance decision regarding SNF denial.   Antimicrobials:  Anti-infectives (From admission, onward)   Start     Dose/Rate Route Frequency Ordered Stop   12/17/20 0900  cefTRIAXone (ROCEPHIN) 1 g in sodium chloride 0.9 % 100 mL IVPB        1 g 200 mL/hr over 30 Minutes Intravenous Daily 12/17/20 0823 12/21/20 1437       Objective: Vitals:   12/23/20 2007 12/23/20 2340 12/24/20 0331 12/24/20 0723  BP: (!) 144/85 133/81 (!) 141/83 126/84  Pulse: 77 79 69 85  Resp: 18 18 18 16   Temp: 98 F (36.7 C) 98 F (36.7 C) 97.9 F (  36.6 C) 98.2 F (36.8 C)  TempSrc: Oral Oral Oral Oral  SpO2: 98% 99% 99%   Weight:      Height:        Intake/Output Summary (Last 24 hours) at 12/24/2020 1003 Last data filed at 12/24/2020 0354 Gross per 24 hour  Intake 504 ml  Output 2375 ml  Net -1871 ml   Filed Weights   12/04/20 2310 12/04/20 2312  Weight: 58.3 kg 58.3 kg    Examination: General exam: Appears calm and comfortable  Respiratory system: Clear to auscultation. Respiratory effort normal. Cardiovascular system: S1 & S2 heard, RRR. No pedal edema. Gastrointestinal system: Abdomen is nondistended, soft and nontender. Normal bowel sounds heard. Central nervous system: Alert  Extremities: Symmetric in appearance bilaterally  Skin: No rashes, lesions or ulcers on  exposed skin  Psychiatry: Poor historian with dementia   Data Reviewed: I have personally reviewed following labs and imaging studies  CBC: Recent Labs  Lab 12/18/20 0226 12/19/20 0310 12/20/20 0129 12/21/20 [id number] 12/21/20 1204 12/21/20 2052 12/22/20 0323 12/23/20 0254 12/24/20 0139  WBC 8.2 8.8 9.7 10.0  --   --  9.9 11.4* 12.3*  NEUTROABS 6.1 5.9 7.1 7.1  --   --  6.9  --   --   HGB 8.0* 7.6* 8.8* 7.2* 6.9* 8.5* 8.4* 9.2* 9.9*  HCT 24.1* 23.6* 27.8* 22.0* 21.7* 25.5* 25.6* 29.2* 30.2*  MCV 94.5 94.4 94.6 94.0  --   --  91.4 93.6 92.6  PLT 295 304 333 338  --   --  330 394 [id number]*   Basic Metabolic Panel: Recent Labs  Lab 12/18/20 0226 12/19/20 0310 12/20/20 0129 12/21/20 0613 12/22/20 0323 12/23/20 0254  NA 132* 132* 132* 133* 134* 133*  K 4.0 4.1 4.4 4.0 4.1 4.0  CL 95* 93* 93* 95* 96* 94*  CO2 28 28 28 28 26 26   GLUCOSE 114* 98 116* 93 87 105*  BUN 13 14 14 15 21  24*  CREATININE 0.76 0.69 0.75 0.70 0.68 0.65  CALCIUM 8.2* 8.1* 8.2* 8.1* 7.9* 8.4*  MG 1.5* 1.8 1.8 1.7 1.8  --   PHOS 2.8 2.6 2.6 3.5 2.9  --    GFR: Estimated Creatinine Clearance: 67.8 mL/min (by C-G formula based on SCr of 0.65 mg/dL). Liver Function Tests: Recent Labs  Lab 12/18/20 0226 12/19/20 0310 12/20/20 0129 12/21/20 0613 12/22/20 0323  AST 25 36 47* 42* 46*  ALT 14 16 22 25  32  ALKPHOS 40 47 54 46 53  BILITOT 0.8 0.3 0.7 0.7 0.6  PROT 5.7* 5.6* 6.1* 5.4* 5.5*  ALBUMIN 2.2* 2.0* 2.2* 1.9* 1.8*   No results for input(s): LIPASE, AMYLASE in the last 168 hours. No results for input(s): AMMONIA in the last 168 hours. Coagulation Profile: No results for input(s): INR, PROTIME in the last 168 hours. Cardiac Enzymes: No results for input(s): CKTOTAL, CKMB, CKMBINDEX, TROPONINI in the last 168 hours. BNP (last 3 results) No results for input(s): PROBNP in the last 8760 hours. HbA1C: No results for input(s): HGBA1C in the last 72 hours. CBG: Recent Labs  Lab 12/19/20 1238  12/19/20 1639  GLUCAP 103* 106*   Lipid Profile: No results for input(s): CHOL, HDL, LDLCALC, TRIG, CHOLHDL, LDLDIRECT in the last 72 hours. Thyroid Function Tests: No results for input(s): TSH, T4TOTAL, FREET4, T3FREE, THYROIDAB in the last 72 hours. Anemia Panel: Recent Labs    12/21/20 1356  VITAMINB12 545  FOLATE 8.8  FERRITIN 1,081*  TIBC 143*  IRON  20*  RETICCTPCT 3.5*   Sepsis Labs: No results for input(s): PROCALCITON, LATICACIDVEN in the last 168 hours.  Recent Results (from the past 240 hour(s))  Culture, blood (routine x 2)     Status: None   Collection Time: 12/17/20  2:50 AM   Specimen: BLOOD  Result Value Ref Range Status   Specimen Description BLOOD RIGHT ANTECUBITAL  Final   Special Requests   Final    BOTTLES DRAWN AEROBIC AND ANAEROBIC Blood Culture adequate volume   Culture   Final    NO GROWTH 5 DAYS Performed at [facility], [address] [place], [place] [address]    Report Status 12/22/2020 FINAL  Final  Culture, blood (routine x 2)     Status: Abnormal   Collection Time: 12/17/20  3:01 AM   Specimen: BLOOD RIGHT ARM  Result Value Ref Range Status   Specimen Description BLOOD RIGHT ARM  Final   Special Requests AEROBIC BOTTLE ONLY Blood Culture adequate volume  Final   Culture  Setup Time   Final    GRAM POSITIVE COCCI IN CLUSTERS AEROBIC BOTTLE ONLY Organism ID to follow CRITICAL RESULT CALLED TO, READ BACK BY AND VERIFIED WITH: PHARMD M [place] [id number] MLM    Culture (A)  Final    STAPHYLOCOCCUS CAPITIS THE SIGNIFICANCE OF ISOLATING THIS ORGANISM FROM A SINGLE SET OF BLOOD CULTURES WHEN MULTIPLE SETS ARE DRAWN IS UNCERTAIN. PLEASE NOTIFY THE MICROBIOLOGY DEPARTMENT WITHIN ONE WEEK IF SPECIATION AND SENSITIVITIES ARE REQUIRED. Performed at [facility], [place] [address], [place], [place] [address]    Report Status 12/19/2020 FINAL  Final  Blood Culture ID Panel (Reflexed)     Status: Abnormal   Collection Time: 12/17/20   3:01 AM  Result Value Ref Range Status   Enterococcus faecalis NOT DETECTED NOT DETECTED Final   Enterococcus Faecium NOT DETECTED NOT DETECTED Final   Listeria monocytogenes NOT DETECTED NOT DETECTED Final   Staphylococcus species DETECTED (A) NOT DETECTED Final    Comment: CRITICAL RESULT CALLED TO, READ BACK BY AND VERIFIED WITH: PHARMD M [place] [id number] MLM    Staphylococcus aureus (BCID) NOT DETECTED NOT DETECTED Final   Staphylococcus epidermidis NOT DETECTED NOT DETECTED Final   Staphylococcus lugdunensis NOT DETECTED NOT DETECTED Final   Streptococcus species NOT DETECTED NOT DETECTED Final   Streptococcus agalactiae NOT DETECTED NOT DETECTED Final   Streptococcus pneumoniae NOT DETECTED NOT DETECTED Final   Streptococcus pyogenes NOT DETECTED NOT DETECTED Final   A.calcoaceticus-baumannii NOT DETECTED NOT DETECTED Final   Bacteroides fragilis NOT DETECTED NOT DETECTED Final   Enterobacterales NOT DETECTED NOT DETECTED Final   Enterobacter cloacae complex NOT DETECTED NOT DETECTED Final   Escherichia coli NOT DETECTED NOT DETECTED Final   Klebsiella aerogenes NOT DETECTED NOT DETECTED Final   Klebsiella oxytoca NOT DETECTED NOT DETECTED Final   Klebsiella pneumoniae NOT DETECTED NOT DETECTED Final   Proteus species NOT DETECTED NOT DETECTED Final   Salmonella species NOT DETECTED NOT DETECTED Final   Serratia marcescens NOT DETECTED NOT DETECTED Final   Haemophilus influenzae NOT DETECTED NOT DETECTED Final   Neisseria meningitidis NOT DETECTED NOT DETECTED Final   Pseudomonas aeruginosa NOT DETECTED NOT DETECTED Final   Stenotrophomonas maltophilia NOT DETECTED NOT DETECTED Final   Candida albicans NOT DETECTED NOT DETECTED Final   Candida auris NOT DETECTED NOT DETECTED Final   Candida glabrata NOT DETECTED NOT DETECTED Final   Candida krusei NOT DETECTED NOT DETECTED Final  Candida parapsilosis NOT DETECTED NOT DETECTED Final   Candida tropicalis NOT DETECTED NOT  DETECTED Final   Cryptococcus neoformans/gattii NOT DETECTED NOT DETECTED Final    Comment: Performed at [facility], [address] [place], [place] [address]  Culture, Urine     Status: Abnormal   Collection Time: 12/17/20  3:11 AM   Specimen: Urine, Random  Result Value Ref Range Status   Specimen Description URINE, RANDOM  Final   Special Requests   Final    NONE Performed at [facility], [place] [address] [place], [place] [address]    Culture >=100,000 COLONIES/mL KLEBSIELLA PNEUMONIAE (A)  Final   Report Status 12/19/2020 FINAL  Final   Organism ID, Bacteria KLEBSIELLA PNEUMONIAE (A)  Final      Susceptibility   Klebsiella pneumoniae - MIC*    AMPICILLIN RESISTANT Resistant     CEFAZOLIN <=4 SENSITIVE Sensitive     CEFEPIME <=0.12 SENSITIVE Sensitive     CEFTRIAXONE <=0.25 SENSITIVE Sensitive     CIPROFLOXACIN <=0.25 SENSITIVE Sensitive     GENTAMICIN <=1 SENSITIVE Sensitive     IMIPENEM <=0.25 SENSITIVE Sensitive     NITROFURANTOIN <=16 SENSITIVE Sensitive     TRIMETH/SULFA <=20 SENSITIVE Sensitive     AMPICILLIN/SULBACTAM <=2 SENSITIVE Sensitive     PIP/TAZO <=4 SENSITIVE Sensitive     * >=100,000 COLONIES/mL KLEBSIELLA PNEUMONIAE  Resp Panel by RT-PCR (Flu A&B, Covid) Nasopharyngeal Swab     Status: None   Collection Time: 12/19/20  1:10 PM   Specimen: Nasopharyngeal Swab; Nasopharyngeal(NP) swabs in vial transport medium  Result Value Ref Range Status   SARS Coronavirus 2 by RT PCR NEGATIVE NEGATIVE Final    Comment: (NOTE) SARS-CoV-2 target nucleic acids are NOT DETECTED.  The SARS-CoV-2 RNA is generally detectable in upper respiratory specimens during the acute phase of infection. The lowest concentration of SARS-CoV-2 viral copies this assay can detect is 138 copies/mL. A negative result does not preclude SARS-Cov-2 infection and should not be used as the sole basis for treatment or other patient management decisions. A negative result may occur  with  improper specimen collection/handling, submission of specimen other than nasopharyngeal swab, presence of viral mutation(s) within the areas targeted by this assay, and inadequate number of viral copies(<138 copies/mL). A negative result must be combined with clinical observations, patient history, and epidemiological information. The expected result is Negative.  Fact Sheet for Patients:  EntrepreneurPulse.com.au  Fact Sheet for Healthcare Providers:  IncredibleEmployment.be  This test is no t yet approved or cleared by the [place] FDA and  has been authorized for detection and/or diagnosis of SARS-CoV-2 by FDA under an Emergency Use Authorization (EUA). This EUA will remain  in effect (meaning this test can be used) for the duration of the COVID-19 declaration under Section 564(b)(1) of the Act, 21 U.S.C.section 360bbb-3(b)(1), unless the authorization is terminated  or revoked sooner.       Influenza A by PCR NEGATIVE NEGATIVE Final   Influenza B by PCR NEGATIVE NEGATIVE Final    Comment: (NOTE) The Xpert Xpress SARS-CoV-2/FLU/RSV plus assay is intended as an aid in the diagnosis of influenza from Nasopharyngeal swab specimens and should not be used as a sole basis for treatment. Nasal washings and aspirates are unacceptable for Xpert Xpress SARS-CoV-2/FLU/RSV testing.  Fact Sheet for Patients: EntrepreneurPulse.com.au  Fact Sheet for Healthcare Providers: IncredibleEmployment.be  This test is not yet approved or cleared by the [place] and has been authorized  for detection and/or diagnosis of SARS-CoV-2 by FDA under an Emergency Use Authorization (EUA). This EUA will remain in effect (meaning this test can be used) for the duration of the COVID-19 declaration under Section 564(b)(1) of the Act, 21 U.S.C. section 360bbb-3(b)(1), unless the authorization is terminated  or revoked.  Performed at [facility], [place] [address], [place], [place] [address]       Radiology Studies: No results found.    Scheduled Meds: . (feeding supplement) PROSource Plus  30 mL Oral BID BM  . amLODipine  2.5 mg Oral Daily  . atorvastatin  20 mg Oral QHS  . divalproex  500 mg Oral Q12H  . feeding supplement  237 mL Oral QID  . FLUoxetine  40 mg Oral Daily  . lubriderm seriously sensitive   Topical Daily  . mirtazapine  30 mg Oral QHS  . multivitamin with minerals  1 tablet Oral Daily  . tamsulosin  0.4 mg Oral QHS   Continuous Infusions: . sodium chloride       LOS: 19 days      Time spent: 15 minutes   Dessa Phi, DO [facility] 12/24/2020, 10:03 AM   Available via Epic secure chat 7am-7pm After these hours, please refer to coverage provider listed on [email or web address]

## 2020-12-24 NOTE — TOC Progression Note (Signed)
Transition of Care [facility]) - Progression Note    Patient Details  Name: Brad [name] MRN: [id number] Date of Birth: 1946/03/13  [facility] CM/SW Contact  Pollie Friar, RN Phone Number: 12/24/2020, 4:09 PM  Clinical Narrative:    Pt has won his appeal for SNF rehab.   Auth #: [id number] Pt has window of 3 days to transfer:1/7-1/9 Jose[facility] Kaufmann is good for 5 days: 1/7-1/10/2021  CM called and updated [facility]. They dont have a bed for the patient. CM called and updated pts sister. She asked that we try [facility] and [facility]. CM has left messages with both facilities.    Expected Discharge Plan: [place] Barriers to Discharge: Hospice Bed not available  Expected Discharge Plan and Services Expected Discharge Plan: [place] In-house Referral: Clinical Social Work Discharge Planning Services: CM Consult   Living arrangements for the past 2 months: Single Family Home                                       Social Determinants of Health (SDOH) Interventions    Readmission Risk Interventions No flowsheet data found.

## 2020-12-24 NOTE — Progress Notes (Signed)
Physical Therapy Treatment Patient Details Name: Brad [name] MRN: [id number] DOB: 1946/03/11 Today's Date: 12/24/2020    History of Present Illness Pt is 75 yo male presenting with AMS. CTH/CTA negative for acute findings; MRI pending. Per MD, there is concern for breakthrough seizure due to missed medications. 12/19 EEG 2 focal motor seizures (RUE rhythmic twitching) suggestive of PRES. PMH includes: prostate ca, anemia, arthritis, dementia, depression, DM, HTN, seizures, substance abuse.  Noted that initial plan was for hospice at d/c but family has changed their minds and want aggressive care at [facility].  Pt unable to provide PLOF but appeared to be living at home.  Did note in MD note from 12/16/20 that pt has been bedbound since 2018.    PT Comments    Pt making minimal progress as he was able to assist with bed mobility minimally this date with him ultimately requiring maxAx2 to transition supine <> sit EOB. Pt awake and does participate some in conversation. Focused on improving pt's balance in sitting and midline orientation through providing cues to acknowledge leans R, L , or posterior and correct, with min success. Pt did progress from requiring maxA-TA initially sitting EOB to being able to maintain upright static sitting balance for up to ~3 min with only min guard assist before fatiguing. Will continue to follow acutely. Current recommendations remain appropriate.    Follow Up Recommendations  Other (comment) (SNF vs long term care pending ablity to participate)     Equipment Recommendations  Hospital bed;Other (comment) (lift)    Recommendations for Other Services       Precautions / Restrictions Precautions Precautions: Fall Precaution Comments: Seizures Required Braces or Orthoses: Other Brace Other Brace: R palm guard; Restrictions Weight Bearing Restrictions: No    Mobility  Bed Mobility Overal bed mobility: Needs Assistance Bed Mobility: Supine to Sit;Sit to  Supine     Supine to sit: +2 for safety/equipment;+2 for physical assistance;Max assist;HOB elevated Sit to supine: +2 for safety/equipment;+2 for physical assistance;Max assist   General bed mobility comments: MaxAx2 for bed mob, cuing pt to reach UEs across body to L side of bed and attempt to bring legs off EOB to transition to sit. Pt able to initiate arm movement and pull with L hand on therapist's hand but required maxAx2 to initiate and complete trunk ascension and manage legs. Cues to lean laterally onto L elbow to return to supine with assistance of maxAx2 to complete.  Transfers                 General transfer comment: Unsafe/unable  Ambulation/Gait                 Stairs             Wheelchair Mobility    Modified Rankin (Stroke Patients Only)       Balance Overall balance assessment: Needs assistance Sitting-balance support: Feet supported;Single extremity supported Sitting balance-Leahy Scale: Poor Sitting balance - Comments: Pt initially leaning posteriorly with maxA-TA to sit EOB but then with tactile and verbal cues to lean anteriorly pt progressed to min guard assist for up to 3 min before fatiguing and needing to return to supine. Postural control: Posterior lean     Standing balance comment: unable/contractures                            Cognition Arousal/Alertness: Awake/alert Behavior During Therapy: Flat affect Overall Cognitive Status: No family/caregiver  present to determine baseline cognitive functioning                                 General Comments: Follows a few simple commands today. Pt conversates some throughout. Unaware of deficits impacting his safety.      Exercises      General Comments General comments (skin integrity, edema, etc.): Folded pillow placed behind R knee and between knees for contracture management and to prevent skin breakdown      Pertinent Vitals/Pain Pain  Assessment: Faces Faces Pain Scale: Hurts whole lot Pain Location: LEs with any movement, esp at knees Pain Descriptors / Indicators: Guarding;Grimacing;Moaning Pain Intervention(s): Limited activity within patient's tolerance;Monitored during session;Repositioned    Home Living                      Prior Function            PT Goals (current goals can now be found in the care plan section) Acute Rehab PT Goals Patient Stated Goal: to lay back PT Goal Formulation: With patient Time For Goal Achievement: 12/30/20 Potential to Achieve Goals: Poor Progress towards PT goals: Progressing toward goals    Frequency    Min 2X/week      PT Plan Current plan remains appropriate    Co-evaluation              AM-PAC PT "6 Clicks" Mobility   Outcome Measure  Help needed turning from your back to your side while in a flat bed without using bedrails?: A Lot Help needed moving from lying on your back to sitting on the side of a flat bed without using bedrails?: A Lot Help needed moving to and from a bed to a chair (including a wheelchair)?: Total Help needed standing up from a chair using your arms (e.g., wheelchair or bedside chair)?: Total Help needed to walk in hospital room?: Total Help needed climbing 3-5 steps with a railing? : Total 6 Click Score: 8    End of Session   Activity Tolerance: Patient limited by pain;Patient limited by fatigue Patient left: in bed;with call bell/phone within reach;with bed alarm set   PT Visit Diagnosis: Other symptoms and signs involving the nervous system (E36.629)     Time: 4765-4650 PT Time Calculation (min) (ACUTE ONLY): 17 min  Charges:  $Neuromuscular Re-education: 8-22 mins                     Moishe Spice, PT, DPT Acute Rehabilitation Services  Pager: [phone] Office: [place] 12/24/2020, 1:17 PM

## 2020-12-24 NOTE — Progress Notes (Signed)
  Speech Language Pathology Treatment: Dysphagia  Patient Details Name: Brad [name] MRN: [id number] DOB: 06/22/1946 Today's Date: 12/24/2020 Time: 1215-1230 SLP Time Calculation (min) (ACUTE ONLY): 15 min  Assessment / Plan / Recommendation Clinical Impression  Pt  seen for skilled dysphagia treatment with nursing present and education provided re: cognitive-based dysphagia and swallowing strategies to utilize to increase overall PO intake with use of varying temperatures, dry spoon to initiate swallow, moderate multimodal cues, and repetitive swallows with pressure provided with spoon for initiating swift swallow.  Family reported pt waxes and wanes with PO intake and swallowing efficiency at home per nursing report.  Continue current diet of Dysphagia 1/thin with swallowing precautions in place for safety; ST will continue to f/u in acute setting for dysphagia tx.  HPI HPI: Pt is a 75 yo male presenting with AMS. CTH/CTA negative for acute findings; MRI pending. Per MD, there is concern for breakthrough seizure due to missed medications. Pt evaluated, progressed to puree and thin after a period of prolonged lethargy, SLP signed off. MD reordered on 12/17/20 due to temp spike and RN report of oral holding.  PMH includes: prostate ca, anemia, arthritis, dementia, depression, DM, HTN, seizures, substance abuse. Previous MBS in June 2020 revealed normal oropharyngeal swallow but with pt c/o globus sensation. Esophagram the same day revealed moderate esophageal dysmotility.      SLP Plan  Continue with current plan of care       Recommendations  Diet recommendations: Dysphagia 1 (puree);Thin liquid Liquids provided via: Straw;Cup Medication Administration: Crushed with puree Supervision: Full supervision/cueing for compensatory strategies;Trained caregiver to feed patient Compensations: Slow rate;Small sips/bites Postural Changes and/or Swallow Maneuvers: Seated upright 90 degrees                 Oral Care Recommendations: Oral care BID Follow up Recommendations: Skilled Nursing facility SLP Visit Diagnosis: Dysphagia, oropharyngeal phase (R13.12) Plan: Continue with current plan of care                       Elvina Sidle, M.S., CCC-SLP 12/24/2020, 1:15 PM

## 2020-12-25 DIAGNOSIS — R64 Cachexia: Secondary | ICD-10-CM | POA: Diagnosis not present

## 2020-12-25 DIAGNOSIS — I10 Essential (primary) hypertension: Secondary | ICD-10-CM | POA: Diagnosis not present

## 2020-12-25 DIAGNOSIS — R569 Unspecified convulsions: Secondary | ICD-10-CM | POA: Diagnosis not present

## 2020-12-25 LAB — CBC
HCT: 28.3 % — ABNORMAL LOW (ref 39.0–52.0)
Hemoglobin: 9.1 g/dL — ABNORMAL LOW (ref 13.0–17.0)
MCH: 30 pg (ref 26.0–34.0)
MCHC: 32.2 g/dL (ref 30.0–36.0)
MCV: 93.4 fL (ref 80.0–100.0)
Platelets: 430 10*3/uL — ABNORMAL HIGH (ref 150–400)
RBC: 3.03 MIL/uL — ABNORMAL LOW (ref 4.22–5.81)
RDW: 14.2 % (ref 11.5–15.5)
WBC: 10.7 10*3/uL — ABNORMAL HIGH (ref 4.0–10.5)
nRBC: 0 % (ref 0.0–0.2)

## 2020-12-25 LAB — BASIC METABOLIC PANEL
Anion gap: 12 (ref 5–15)
BUN: 23 mg/dL (ref 8–23)
CO2: 27 mmol/L (ref 22–32)
Calcium: 8.4 mg/dL — ABNORMAL LOW (ref 8.9–10.3)
Chloride: 97 mmol/L — ABNORMAL LOW (ref 98–111)
Creatinine, Ser: 0.66 mg/dL (ref 0.61–1.24)
GFR, Estimated: >60 mL/min (ref >60)
Glucose, Bld: 88 mg/dL (ref 70–99)
Potassium: 4.2 mmol/L (ref 3.5–5.1)
Sodium: 136 mmol/L (ref 135–145)

## 2020-12-25 NOTE — Progress Notes (Signed)
Progress Note    [name]  PFX:[id number] DOB: 09-02-1946  DOA: 12/04/2020 PCP: [name], DO    Brief Narrative:   Chief complaint: [place] records reviewed and are as summarized below:  Brad [name] is an 75 y.o. male with a PMH of prostate cancer, depression, type 2 diabetes mellitus, hypertension, hyperlipidemia, seizure disorder, substance abuse as well as other comorbidities who presented to the emergency room with altered mental status. Upon arrival to the ED he was noted to be hypertensive with a systolic blood pressure 329J but the rest of his vital signs were stable. WBC was 9.9 and blood glucose was 75. Head CT showed no acute intracranial abnormality. CTA of the head neck showed normal CT perfusion study and an unchanged 60% stenosis of the proximal left internal carotid artery secondary to mixed density atherosclerosis. He was seen by neurology and after speaking with family, the patient's presentation was felt to be secondary to seizure activity at home. Given IV valproic acid. Patient has been bedbound since 2018.   During his hospital course he has had poor p.o. intake and palliative care was consulted. Patient's daughter made her father DNR and it was noted that the patient's living will documents that he does not want artificial means of feeding. Initially after palliative care discussions family was inclined towards comfort care and hospice placement but then changed her mind and wanted aggressive care. Patient was being worked up for SNF placement. Hospitalization further complicated by UTI klebsiella as well as downward trend in hgb without overt blood loss.   Notified by TOC that patient's insurance denied SNF placement. Family appealed and won, so SNF placement now pending.    Assessment/Plan:   Principle Problem: Breakthrough seizure -Likely secondary to medication noncompliance. -Continue Depakote, no seizure events noted.   -Appreciate neurology consult.  Active Problems: Cachexia/Poor oral intake/Severe protein calorie malnutrition/underweight/hypoalbuminemia -Agree with assessment by dietician below: -Albumin persistently low  -Signs/Symptoms: meal completion < 50%. Patient with muscle/fat store loss.  -Interventions: MVI,Boost Breeze,Prostat,Refer to RD note for recommendations  -Body mass index is 16.96 kg/m. -Patient's living will states that patient does not wish for artificial nutrition. -Appreciate SLP recommendations. Continue dysphagia I diet. -Currently on dysphagia diet but intake remains poor.  Klebsiella UTI -Completed antibiotic therapy.  Essential hypertension -Continue amlodipine and PRN Hydralazine.  Hyperlipidemia -Continue Lipitor.  Normocytic anemia, anemia of chronic disease -Baseline hemoglobin has ranged between 7-9 and had slow downward trend to 6.9.  No overt blood loss.  Status post 1 unit packed red blood cells on 1/4.  FOBT pending.  Hemoglobin stable at this time, continue to monitor  Advanced dementia -Appears to be his baseline.  Noted that patient has been bedbound since 2018  Leukocytosis -Likely reactive. He is afebrile without specific complaints. Improving on IVF.  Multiple pressure wounds as documented below In agreement with assessment of the pressure ulcer as below:  Pressure Injury 12/05/20 Coccyx Medial Stage 2 -  Partial thickness loss of dermis presenting as a shallow open injury with a red, pink wound bed without slough. 49mm diameter open circular abrasion, top layer skin not intact, red and blanchable, superior (Active)  12/05/20 2145  Location: Coccyx  Location Orientation: Medial  Staging: Stage 2 -  Partial thickness loss of dermis presenting as a shallow open injury with a red, pink wound bed without slough.  Wound Description (Comments): 16mm diameter open circular abrasion, top layer skin not intact, red and blanchable,  superior to  medial coccyx  Present on Admission: Yes     Pressure Injury 12/22/20 Ankle Anterior;Right Deep Tissue Pressure Injury - Purple or maroon localized area of discolored intact skin or blood-filled blister due to damage of underlying soft tissue from pressure and/or shear. purple ecchymosis 1.8x1.5cm  (Active)  12/22/20 1720  Location: Ankle  Location Orientation: Anterior;Right  Staging: Deep Tissue Pressure Injury - Purple or maroon localized area of discolored intact skin or blood-filled blister due to damage of underlying soft tissue from pressure and/or shear.  Wound Description (Comments): purple ecchymosis 1.8x1.5cm near maleolus  Present on Admission: No     Pressure Injury 12/22/20 Heel Left Deep Tissue Pressure Injury - Purple or maroon localized area of discolored intact skin or blood-filled blister due to damage of underlying soft tissue from pressure and/or shear. 9x6cm dark pink/ red area across heel (Active)  12/22/20 1720  Location: Heel  Location Orientation: Left  Staging: Deep Tissue Pressure Injury - Purple or maroon localized area of discolored intact skin or blood-filled blister due to damage of underlying soft tissue from pressure and/or shear.  Wound Description (Comments): 9x6cm dark pink/ red area across heel  Present on Admission: No     Pressure Injury 12/22/20 Heel Right Deep Tissue Pressure Injury - Purple or maroon localized area of discolored intact skin or blood-filled blister due to damage of underlying soft tissue from pressure and/or shear. 4x7cm dark purple area across heel (Active)  12/22/20 1720  Location: Heel  Location Orientation: Right  Staging: Deep Tissue Pressure Injury - Purple or maroon localized area of discolored intact skin or blood-filled blister due to damage of underlying soft tissue from pressure and/or shear.  Wound Description (Comments): 4x7cm dark purple area across heel  Present on Admission: No     Family  Communication/Anticipated D/C date and plan/Code Status   DVT prophylaxis: Place and maintain sequential compression device Start: 12/22/20 1315   Code Status: DNR.  Family Communication: No family at the bedside. Sister updated by telephone. Disposition Plan: Status is: Inpatient  Remains inpatient appropriate because:awaiting SNF placement, unsafe to d/c home. Family chose [facility], hoping to have bed available on Monday.   Dispo: The patient is from: Home              Anticipated d/c is to: SNF              Anticipated d/c date is: 1 day              Patient currently is medically stable to d/c once a SNF bed is available.    Medical Consultants:    Palliative Care  Neurology   Anti-Infectives:    None  Subjective:   Reports foot pain.  No dyspnea, cough. Vomited and had vomit soiling his face/bed clothes on assessment.  Objective:    Vitals:   12/24/20 1602 12/24/20 2126 12/24/20 2341 12/25/20 0400  BP: 133/73 (!) 148/84 123/81 129/82  Pulse: 79 91 98 86  Resp: 18 16 16 18   Temp: 98.6 F (37 C) 99.5 F (37.5 C) 99.4 F (37.4 C) 98.5 F (36.9 C)  TempSrc: Oral Oral Oral Oral  SpO2: 100% 98% 97% 97%  Weight:      Height:        Intake/Output Summary (Last 24 hours) at 12/25/2020 0735 Last data filed at 12/24/2020 2006 Gross per 24 hour  Intake --  Output 650 ml  Net -650 ml   [employer]  12/04/20 2310 12/04/20 2312  Weight: 58.3 kg 58.3 kg    Exam: General: No acute distress. Thin and frail with loss of muscle mass and subcutaneous fat stores. Cardiovascular: Heart sounds show a regular rate, and rhythm. No gallops or rubs. No murmurs. No JVD. Lungs: Clear to auscultation bilaterally with good air movement. No rales, rhonchi or wheezes. Abdomen: Soft, nontender, nondistended with normal active bowel sounds. No masses. No hepatosplenomegaly. Neurological: Alert but not fully oriented, slow to respond. Moves all extremities 4 with decreased  strength. Cranial nerves II through XII grossly intact. Skin: Warm and dry. No rashes or lesions. Extremities: No clubbing or cyanosis. No edema. Pedal pulses 2+. Psychiatric: Mood and affect are flat. Insight and judgment are impaired.  Pressure Injury 12/05/20 Coccyx Medial Stage 2 -  Partial thickness loss of dermis presenting as a shallow open injury with a red, pink wound bed without slough. 51mm diameter open circular abrasion, top layer skin not intact, red and blanchable, superior (Active)  12/05/20 2145  Location: Coccyx  Location Orientation: Medial  Staging: Stage 2 -  Partial thickness loss of dermis presenting as a shallow open injury with a red, pink wound bed without slough.  Wound Description (Comments): 80mm diameter open circular abrasion, top layer skin not intact, red and blanchable, superior to medial coccyx  Present on Admission: Yes     Pressure Injury 12/22/20 Ankle Anterior;Right Deep Tissue Pressure Injury - Purple or maroon localized area of discolored intact skin or blood-filled blister due to damage of underlying soft tissue from pressure and/or shear. purple ecchymosis 1.8x1.5cm  (Active)  12/22/20 1720  Location: Ankle  Location Orientation: Anterior;Right  Staging: Deep Tissue Pressure Injury - Purple or maroon localized area of discolored intact skin or blood-filled blister due to damage of underlying soft tissue from pressure and/or shear.  Wound Description (Comments): purple ecchymosis 1.8x1.5cm near maleolus  Present on Admission: No     Pressure Injury 12/22/20 Heel Left Deep Tissue Pressure Injury - Purple or maroon localized area of discolored intact skin or blood-filled blister due to damage of underlying soft tissue from pressure and/or shear. 9x6cm dark pink/ red area across heel (Active)  12/22/20 1720  Location: Heel  Location Orientation: Left  Staging: Deep Tissue Pressure Injury - Purple or maroon localized area of discolored intact skin or  blood-filled blister due to damage of underlying soft tissue from pressure and/or shear.  Wound Description (Comments): 9x6cm dark pink/ red area across heel  Present on Admission: No     Pressure Injury 12/22/20 Heel Right Deep Tissue Pressure Injury - Purple or maroon localized area of discolored intact skin or blood-filled blister due to damage of underlying soft tissue from pressure and/or shear. 4x7cm dark purple area across heel (Active)  12/22/20 1720  Location: Heel  Location Orientation: Right  Staging: Deep Tissue Pressure Injury - Purple or maroon localized area of discolored intact skin or blood-filled blister due to damage of underlying soft tissue from pressure and/or shear.  Wound Description (Comments): 4x7cm dark purple area across heel  Present on Admission: No        Data Reviewed:   I have personally reviewed following labs and imaging studies:  Labs: Labs show the following:   Basic Metabolic Panel: Recent Labs  Lab 12/19/20 0310 12/20/20 0129 12/21/20 0613 12/22/20 0323 12/23/20 0254 12/25/20 0404  NA 132* 132* 133* 134* 133* 136  K 4.1 4.4 4.0 4.1 4.0 4.2  CL 93* 93* 95* 96* 94* 97*  CO2 28 28 28 26 26 27   GLUCOSE 98 116* 93 87 105* 88  BUN 14 14 15 21  24* 23  CREATININE 0.69 0.75 0.70 0.68 0.65 0.66  CALCIUM 8.1* 8.2* 8.1* 7.9* 8.4* 8.4*  MG 1.8 1.8 1.7 1.8  --   --   PHOS 2.6 2.6 3.5 2.9  --   --    GFR Estimated Creatinine Clearance: 67.8 mL/min (by C-G formula based on SCr of 0.66 mg/dL). Liver Function Tests: Recent Labs  Lab 12/19/20 0310 12/20/20 0129 12/21/20 0613 12/22/20 0323  AST 36 47* 42* 46*  ALT 16 22 25  32  ALKPHOS 47 54 46 53  BILITOT 0.3 0.7 0.7 0.6  PROT 5.6* 6.1* 5.4* 5.5*  ALBUMIN 2.0* 2.2* 1.9* 1.8*   CBC: Recent Labs  Lab 12/19/20 0310 12/20/20 0129 12/21/20 2683 12/21/20 1204 12/21/20 2052 12/22/20 0323 12/23/20 0254 12/24/20 0139 12/25/20 0404  WBC 8.8 9.7 10.0  --   --  9.9 11.4* 12.3* 10.7*   NEUTROABS 5.9 7.1 7.1  --   --  6.9  --   --   --   HGB 7.6* 8.8* 7.2*   < > 8.5* 8.4* 9.2* 9.9* 9.1*  HCT 23.6* 27.8* 22.0*   < > 25.5* 25.6* 29.2* 30.2* 28.3*  MCV 94.4 94.6 94.0  --   --  91.4 93.6 92.6 93.4  PLT 304 333 338  --   --  330 394 454* 430*   < > = values in this interval not displayed.   CBG: Recent Labs  Lab 12/19/20 1238 12/19/20 1639  GLUCAP 103* 106*    Microbiology Recent Results (from the past 240 hour(s))  Culture, blood (routine x 2)     Status: None   Collection Time: 12/17/20  2:50 AM   Specimen: BLOOD  Result Value Ref Range Status   Specimen Description BLOOD RIGHT ANTECUBITAL  Final   Special Requests   Final    BOTTLES DRAWN AEROBIC AND ANAEROBIC Blood Culture adequate volume   Culture   Final    NO GROWTH 5 DAYS Performed at [facility], [address] [place], [place] [address]    Report Status 12/22/2020 FINAL  Final  Culture, blood (routine x 2)     Status: Abnormal   Collection Time: 12/17/20  3:01 AM   Specimen: BLOOD RIGHT ARM  Result Value Ref Range Status   Specimen Description BLOOD RIGHT ARM  Final   Special Requests AEROBIC BOTTLE ONLY Blood Culture adequate volume  Final   Culture  Setup Time   Final    GRAM POSITIVE COCCI IN CLUSTERS AEROBIC BOTTLE ONLY Organism ID to follow CRITICAL RESULT CALLED TO, READ BACK BY AND VERIFIED WITH: PHARMD M [place] [id number] MLM    Culture (A)  Final    STAPHYLOCOCCUS CAPITIS THE SIGNIFICANCE OF ISOLATING THIS ORGANISM FROM A SINGLE SET OF BLOOD CULTURES WHEN MULTIPLE SETS ARE DRAWN IS UNCERTAIN. PLEASE NOTIFY THE MICROBIOLOGY DEPARTMENT WITHIN ONE WEEK IF SPECIATION AND SENSITIVITIES ARE REQUIRED. Performed at [facility], [place] [address], [place], [place] [address]    Report Status 12/19/2020 FINAL  Final  Blood Culture ID Panel (Reflexed)     Status: Abnormal   Collection Time: 12/17/20  3:01 AM  Result Value Ref Range Status   Enterococcus faecalis NOT DETECTED NOT  DETECTED Final   Enterococcus Faecium NOT DETECTED NOT DETECTED Final   Listeria monocytogenes NOT DETECTED NOT DETECTED Final   Staphylococcus  species DETECTED (A) NOT DETECTED Final    Comment: CRITICAL RESULT CALLED TO, READ BACK BY AND VERIFIED WITH: PHARMD M [place] [id number] MLM    Staphylococcus aureus (BCID) NOT DETECTED NOT DETECTED Final   Staphylococcus epidermidis NOT DETECTED NOT DETECTED Final   Staphylococcus lugdunensis NOT DETECTED NOT DETECTED Final   Streptococcus species NOT DETECTED NOT DETECTED Final   Streptococcus agalactiae NOT DETECTED NOT DETECTED Final   Streptococcus pneumoniae NOT DETECTED NOT DETECTED Final   Streptococcus pyogenes NOT DETECTED NOT DETECTED Final   A.calcoaceticus-baumannii NOT DETECTED NOT DETECTED Final   Bacteroides fragilis NOT DETECTED NOT DETECTED Final   Enterobacterales NOT DETECTED NOT DETECTED Final   Enterobacter cloacae complex NOT DETECTED NOT DETECTED Final   Escherichia coli NOT DETECTED NOT DETECTED Final   Klebsiella aerogenes NOT DETECTED NOT DETECTED Final   Klebsiella oxytoca NOT DETECTED NOT DETECTED Final   Klebsiella pneumoniae NOT DETECTED NOT DETECTED Final   Proteus species NOT DETECTED NOT DETECTED Final   Salmonella species NOT DETECTED NOT DETECTED Final   Serratia marcescens NOT DETECTED NOT DETECTED Final   Haemophilus influenzae NOT DETECTED NOT DETECTED Final   Neisseria meningitidis NOT DETECTED NOT DETECTED Final   Pseudomonas aeruginosa NOT DETECTED NOT DETECTED Final   Stenotrophomonas maltophilia NOT DETECTED NOT DETECTED Final   Candida albicans NOT DETECTED NOT DETECTED Final   Candida auris NOT DETECTED NOT DETECTED Final   Candida glabrata NOT DETECTED NOT DETECTED Final   Candida krusei NOT DETECTED NOT DETECTED Final   Candida parapsilosis NOT DETECTED NOT DETECTED Final   Candida tropicalis NOT DETECTED NOT DETECTED Final   Cryptococcus neoformans/gattii NOT DETECTED NOT DETECTED Final     Comment: Performed at [facility], [address], [place], [place] [address]  Culture, Urine     Status: Abnormal   Collection Time: 12/17/20  3:11 AM   Specimen: Urine, Random  Result Value Ref Range Status   Specimen Description URINE, RANDOM  Final   Special Requests   Final    NONE Performed at [facility], [place] [address], [place], [name] [address]    Culture >=100,000 COLONIES/mL KLEBSIELLA PNEUMONIAE (A)  Final   Report Status 12/19/2020 FINAL  Final   Organism ID, Bacteria KLEBSIELLA PNEUMONIAE (A)  Final      Susceptibility   Klebsiella pneumoniae - MIC*    AMPICILLIN RESISTANT Resistant     CEFAZOLIN <=4 SENSITIVE Sensitive     CEFEPIME <=0.12 SENSITIVE Sensitive     CEFTRIAXONE <=0.25 SENSITIVE Sensitive     CIPROFLOXACIN <=0.25 SENSITIVE Sensitive     GENTAMICIN <=1 SENSITIVE Sensitive     IMIPENEM <=0.25 SENSITIVE Sensitive     NITROFURANTOIN <=16 SENSITIVE Sensitive     TRIMETH/SULFA <=20 SENSITIVE Sensitive     AMPICILLIN/SULBACTAM <=2 SENSITIVE Sensitive     PIP/TAZO <=4 SENSITIVE Sensitive     * >=100,000 COLONIES/mL KLEBSIELLA PNEUMONIAE  Resp Panel by RT-PCR (Flu A&B, Covid) Nasopharyngeal Swab     Status: None   Collection Time: 12/19/20  1:10 PM   Specimen: Nasopharyngeal Swab; Nasopharyngeal(NP) swabs in vial transport medium  Result Value Ref Range Status   SARS Coronavirus 2 by RT PCR NEGATIVE NEGATIVE Final    Comment: (NOTE) SARS-CoV-2 target nucleic acids are NOT DETECTED.  The SARS-CoV-2 RNA is generally detectable in upper respiratory specimens during the acute phase of infection. The lowest concentration of SARS-CoV-2 viral copies this assay can detect is 138 copies/mL. A negative result  does not preclude SARS-Cov-2 infection and should not be used as the sole basis for treatment or other patient management decisions. A negative result may occur with  improper specimen collection/handling, submission of specimen other than  nasopharyngeal swab, presence of viral mutation(s) within the areas targeted by this assay, and inadequate number of viral copies(<138 copies/mL). A negative result must be combined with clinical observations, patient history, and epidemiological information. The expected result is Negative.  Fact Sheet for Patients:  EntrepreneurPulse.com.au  Fact Sheet for Healthcare Providers:  IncredibleEmployment.be  This test is no t yet approved or cleared by the [place] FDA and  has been authorized for detection and/or diagnosis of SARS-CoV-2 by FDA under an Emergency Use Authorization (EUA). This EUA will remain  in effect (meaning this test can be used) for the duration of the COVID-19 declaration under Section 564(b)(1) of the Act, 21 U.S.C.section 360bbb-3(b)(1), unless the authorization is terminated  or revoked sooner.       Influenza A by PCR NEGATIVE NEGATIVE Final   Influenza B by PCR NEGATIVE NEGATIVE Final    Comment: (NOTE) The Xpert Xpress SARS-CoV-2/FLU/RSV plus assay is intended as an aid in the diagnosis of influenza from Nasopharyngeal swab specimens and should not be used as a sole basis for treatment. Nasal washings and aspirates are unacceptable for Xpert Xpress SARS-CoV-2/FLU/RSV testing.  Fact Sheet for Patients: EntrepreneurPulse.com.au  Fact Sheet for Healthcare Providers: IncredibleEmployment.be  This test is not yet approved or cleared by the [place] FDA and has been authorized for detection and/or diagnosis of SARS-CoV-2 by FDA under an Emergency Use Authorization (EUA). This EUA will remain in effect (meaning this test can be used) for the duration of the COVID-19 declaration under Section 564(b)(1) of the Act, 21 U.S.C. section 360bbb-3(b)(1), unless the authorization is terminated or revoked.  Performed at [facility], [place] [address], [place],  [address]      Procedures and diagnostic studies:  No results found.  Medications:   . (feeding supplement) PROSource Plus  30 mL Oral BID BM  . amLODipine  2.5 mg Oral Daily  . atorvastatin  20 mg Oral QHS  . divalproex  500 mg Oral Q12H  . feeding supplement  237 mL Oral QID  . FLUoxetine  40 mg Oral Daily  . lubriderm seriously sensitive   Topical Daily  . mirtazapine  30 mg Oral QHS  . multivitamin with minerals  1 tablet Oral Daily  . tamsulosin  0.4 mg Oral QHS   Continuous Infusions:   LOS: 20 days   Jacquelynn Cree, MD  [facility]   [facility] How to contact the [facility] Attending or Consulting provider [place] or covering provider during after hours [place], for this patient?  1. Check the care team in [facility] and look for a) attending/consulting TR[facility] provider listed and b) the [facility] team listed 2. Log into [email or web address] and use [place]'s universal password to access. If you do not have the password, please contact the hospital operator. 3. Locate the [facility] provider you are looking for under [facility] and page to a number that you can be directly reached. 4. If you still have difficulty reaching the provider, please page the [facility] (Director on Call) for the Hospitalists listed on amion for assistance.  12/25/2020, 7:35 AM

## 2020-12-25 NOTE — TOC Progression Note (Signed)
Transition of Care [facility]) - Progression Note    Patient Details  Name: Brad [name] MRN: [id number] Date of Birth: 13-Apr-1946  Transition of Care [facility]) CM/SW [place], [place] Phone Number: 12/25/2020, 10:55 AM  Clinical Narrative:    CSW contacted pt's sister to update her on bed offers. Sister chose [facility]. Loie with [facility] was notified, she noted she will likely not have beds until at least Monday. CSW will hold off on starting auth and ordering Covid test until tomorrow. SW will continue to follow for transition of care needs.   Expected Discharge Plan: [place] Barriers to Discharge: Hospice Bed not available  Expected Discharge Plan and Services Expected Discharge Plan: [place] In-house Referral: Clinical Social Work Discharge Planning Services: CM Consult   Living arrangements for the past 2 months: Single Family Home                                       Social Determinants of Health (SDOH) Interventions    Readmission Risk Interventions No flowsheet data found.

## 2020-12-26 DIAGNOSIS — R569 Unspecified convulsions: Secondary | ICD-10-CM | POA: Diagnosis not present

## 2020-12-26 DIAGNOSIS — I10 Essential (primary) hypertension: Secondary | ICD-10-CM | POA: Diagnosis not present

## 2020-12-26 DIAGNOSIS — R64 Cachexia: Secondary | ICD-10-CM | POA: Diagnosis not present

## 2020-12-26 LAB — SARS CORONAVIRUS 2 BY RT PCR (HOSPITAL ORDER, PERFORMED IN ~~LOC~~ HOSPITAL LAB): SARS Coronavirus 2: NEGATIVE

## 2020-12-26 NOTE — Plan of Care (Signed)
  Problem: Education: Goal: Knowledge of General Education information will improve Description: Including pain rating scale, medication(s)/side effects and non-pharmacologic comfort measures Outcome: Progressing   Problem: Health Behavior/Discharge Planning: Goal: Ability to manage health-related needs will improve Outcome: Progressing   Problem: Clinical Measurements: Goal: Ability to maintain clinical measurements within normal limits will improve Outcome: Progressing Goal: Will remain free from infection Outcome: Progressing Goal: Diagnostic test results will improve Outcome: Progressing Goal: Respiratory complications will improve Outcome: Progressing Goal: Cardiovascular complication will be avoided Outcome: Progressing   Problem: Activity: Goal: Risk for activity intolerance will decrease Outcome: Progressing   Problem: Nutrition: Goal: Adequate nutrition will be maintained Outcome: Progressing   Problem: Coping: Goal: Level of anxiety will decrease Outcome: Progressing   Problem: Elimination: Goal: Will not experience complications related to bowel motility Outcome: Progressing Goal: Will not experience complications related to urinary retention Outcome: Progressing   Problem: Pain Managment: Goal: General experience of comfort will improve Outcome: Progressing   Problem: Safety: Goal: Ability to remain free from injury will improve Outcome: Progressing   Problem: Skin Integrity: Goal: Risk for impaired skin integrity will decrease Outcome: Progressing   Problem: Education: Goal: Expressions of having a comfortable level of knowledge regarding the disease process will increase 12/26/2020 0427 by Lennox Grumbles, RN Outcome: Progressing 12/26/2020 0427 by Lennox Grumbles, RN Outcome: Progressing   Problem: Coping: Goal: Ability to adjust to condition or change in health will improve 12/26/2020 0427 by Lennox Grumbles, RN Outcome: Progressing 12/26/2020 0427  by Lennox Grumbles, RN Outcome: Progressing Goal: Ability to identify appropriate support needs will improve 12/26/2020 0427 by Lennox Grumbles, RN Outcome: Progressing 12/26/2020 0427 by Lennox Grumbles, RN Outcome: Progressing   Problem: Health Behavior/Discharge Planning: Goal: Compliance with prescribed medication regimen will improve 12/26/2020 0427 by Lennox Grumbles, RN Outcome: Progressing 12/26/2020 0427 by Lennox Grumbles, RN Outcome: Progressing   Problem: Medication: Goal: Risk for medication side effects will decrease 12/26/2020 0427 by Lennox Grumbles, RN Outcome: Progressing 12/26/2020 0427 by Lennox Grumbles, RN Outcome: Progressing   Problem: Clinical Measurements: Goal: Complications related to the disease process, condition or treatment will be avoided or minimized 12/26/2020 0427 by Lennox Grumbles, RN Outcome: Progressing 12/26/2020 0427 by Lennox Grumbles, RN Outcome: Progressing Goal: Diagnostic test results will improve 12/26/2020 0427 by Lennox Grumbles, RN Outcome: Progressing 12/26/2020 0427 by Lennox Grumbles, RN Outcome: Progressing   Problem: Safety: Goal: Verbalization of understanding the information provided will improve 12/26/2020 0427 by Lennox Grumbles, RN Outcome: Progressing 12/26/2020 0427 by Lennox Grumbles, RN Outcome: Progressing   Problem: Self-Concept: Goal: Level of anxiety will decrease 12/26/2020 0427 by Lennox Grumbles, RN Outcome: Progressing 12/26/2020 0427 by Lennox Grumbles, RN Outcome: Progressing Goal: Ability to verbalize feelings about condition will improve 12/26/2020 0427 by Lennox Grumbles, RN Outcome: Progressing 12/26/2020 0427 by Lennox Grumbles, RN Outcome: Progressing

## 2020-12-26 NOTE — TOC Progression Note (Addendum)
Transition of Care [facility]) - Progression Note    Patient Details  Name: Brad [name] MRN: [id number] Date of Birth: May 22, 1946  Transition [facility] Care [facility] CM/SW [place], [place] Phone Number: 12/26/2020, 9:01 AM  Clinical Narrative:     CSW started insurance authorization and requested a covid test from MD in preparation for beginning of the week DC. SW will continue to follow for transition of care.  [employer] ID [id number] 1/10-1/14 Altha Harm, case manager   Expected Discharge Plan: [place] Barriers to Discharge: Hospice Bed not available  Expected Discharge Plan and Services Expected Discharge Plan: [place] In-house Referral: Clinical Social Work Discharge Planning Services: CM Consult   Living arrangements for the past 2 months: Single Family Home                                       Social Determinants of Health (SDOH) Interventions    Readmission Risk Interventions No flowsheet data found.

## 2020-12-26 NOTE — Progress Notes (Signed)
Progress Note    DRAIDEN OLSHANSKY  [id number] DOB: [birth year]  DOA: 12/04/2020 PCP: [name], DO    Brief Narrative:   Chief complaint: [facility] records reviewed and are as summarized below:  CARLOSALBERTO YACKO is an 75 y.o. male with a PMH of prostate cancer, depression, type 2 diabetes mellitus, hypertension, hyperlipidemia, seizure disorder, substance abuse as well as other comorbidities who presented to the emergency room with altered mental status. Upon arrival to the ED he was noted to be hypertensive with a systolic blood pressure [id number] but the rest of his vital signs were stable. WBC was 9.9 and blood glucose was 75. Head CT showed no acute intracranial abnormality. CTA of the head neck showed normal CT perfusion study and an unchanged 60% stenosis of the proximal left internal carotid artery secondary to mixed density atherosclerosis. He was seen by neurology and after speaking with family, the patient's presentation was felt to be secondary to seizure activity at home. Given IV valproic acid. Patient has been bedbound since 2018.   During his hospital course he has had poor p.o. intake and palliative care was consulted. Patient's daughter made her father DNR and it was noted that the patient's living will documents that he does not want artificial means of feeding. Initially after palliative care discussions family was inclined towards comfort care and hospice placement but then changed her mind and wanted aggressive care. Patient was being worked up for SNF placement. Hospitalization further complicated by UTI klebsiella as well as downward trend in hgb without overt blood loss.   Notified by TOC that patient's insurance denied SNF placement. Family appealed and won, so SNF placement now pending.    Assessment/Plan:   Principle Problem: Breakthrough seizure -Likely secondary to medication noncompliance. -Continue Depakote, no seizure events noted.   -Appreciate neurology consult.  Active Problems: Cachexia/Poor oral intake/Severe protein calorie malnutrition/underweight/hypoalbuminemia -Agree with assessment by dietician below: -Albumin persistently low  -Signs/Symptoms: meal completion < 50%. Patient with muscle/fat store loss.  -Interventions: MVI,Boost Breeze,Prostat,Refer to RD note for recommendations  -Body mass index is 16.96 kg/m. -Patient's living will states that patient does not wish for artificial nutrition. -Appreciate SLP recommendations. Continue dysphagia I diet. -Currently on dysphagia diet but intake remains poor.  Klebsiella UTI -Completed antibiotic therapy.  Essential hypertension -Continue amlodipine and PRN Hydralazine.  Hyperlipidemia -Continue Lipitor.  Normocytic anemia, anemia of chronic disease -Baseline hemoglobin has ranged between 7-9 and had slow downward trend to 6.9.  No overt blood loss.  Status post 1 unit packed red blood cells on 12/21/20.  FOBT pending.  Hemoglobin stable at this time, continue to monitor  Advanced dementia -More agitated today.  Noted that patient has been bedbound since 2018.  Leukocytosis -Likely reactive. He is afebrile without specific complaints. Improving on IVF.  Multiple pressure wounds as documented below In agreement with assessment of the pressure ulcer as below:  Pressure Injury 12/05/20 Coccyx Medial Stage 2 -  Partial thickness loss of dermis presenting as a shallow open injury with a red, pink wound bed without slough. 30mm diameter open circular abrasion, top layer skin not intact, red and blanchable, superior (Active)  12/05/20 2145  Location: Coccyx  Location Orientation: Medial  Staging: Stage 2 -  Partial thickness loss of dermis presenting as a shallow open injury with a red, pink wound bed without slough.  Wound Description (Comments): 78mm diameter open circular abrasion, top layer skin not intact, red and blanchable, superior to  medial  coccyx  Present on Admission: Yes     Pressure Injury 12/22/20 Ankle Anterior;Right Deep Tissue Pressure Injury - Purple or maroon localized area of discolored intact skin or blood-filled blister due to damage of underlying soft tissue from pressure and/or shear. purple ecchymosis 1.8x1.5cm  (Active)  12/22/20 1720  Location: Ankle  Location Orientation: Anterior;Right  Staging: Deep Tissue Pressure Injury - Purple or maroon localized area of discolored intact skin or blood-filled blister due to damage of underlying soft tissue from pressure and/or shear.  Wound Description (Comments): purple ecchymosis 1.8x1.5cm near maleolus  Present on Admission: No     Pressure Injury 12/22/20 Heel Left Deep Tissue Pressure Injury - Purple or maroon localized area of discolored intact skin or blood-filled blister due to damage of underlying soft tissue from pressure and/or shear. 9x6cm dark pink/ red area across heel (Active)  12/22/20 1720  Location: Heel  Location Orientation: Left  Staging: Deep Tissue Pressure Injury - Purple or maroon localized area of discolored intact skin or blood-filled blister due to damage of underlying soft tissue from pressure and/or shear.  Wound Description (Comments): 9x6cm dark pink/ red area across heel  Present on Admission: No     Pressure Injury 12/22/20 Heel Right Deep Tissue Pressure Injury - Purple or maroon localized area of discolored intact skin or blood-filled blister due to damage of underlying soft tissue from pressure and/or shear. 4x7cm dark purple area across heel (Active)  12/22/20 1720  Location: Heel  Location Orientation: Right  Staging: Deep Tissue Pressure Injury - Purple or maroon localized area of discolored intact skin or blood-filled blister due to damage of underlying soft tissue from pressure and/or shear.  Wound Description (Comments): 4x7cm dark purple area across heel  Present on Admission: No     Family Communication/Anticipated D/C  date and plan/Code Status   DVT prophylaxis: Place and maintain sequential compression device Start: 12/22/20 1315   Code Status: DNR.  Family Communication: No family at the bedside. Sister updated by telephone. Disposition Plan: Status is: Inpatient  Remains inpatient appropriate because:awaiting SNF placement, unsafe to d/c home. Family chose [facility], hoping to have bed available on Monday.   Dispo: The patient is from: Home              Anticipated d/c is to: SNF              Anticipated d/c date is: 1 day              Patient currently is medically stable to d/c once a SNF bed is available.  Medical Consultants:    Palliative Care  Neurology   Anti-Infectives:    None  Subjective:   Awake, states "I don't feel right" but cannot articulate further.  No nausea or vomiting today. Denies trouble with his breathing.Very slow to respond.  Objective:    Vitals:   12/25/20 2134 12/26/20 0003 12/26/20 0316 12/26/20 0718  BP: 135/65 (!) 130/59 (!) 133/58 136/66  Pulse: 74 75 82 79  Resp: 18 18 18 18   Temp: 98.2 F (36.8 C) 98.3 F (36.8 C) 97.8 F (36.6 C) 98.4 F (36.9 C)  TempSrc: Oral Oral Oral Oral  SpO2: 100% 100% 100% 100%  Weight:      Height:        Intake/Output Summary (Last 24 hours) at 12/26/2020 0723 Last data filed at 12/25/2020 1306 Gross per 24 hour  Intake 200 ml  Output 500 ml  Net -300 ml  Filed Weights   12/04/20 2310 12/04/20 2312  Weight: 58.3 kg 58.3 kg    Exam:  General: No acute distress. Thin and frail with loss of muscle mass and subcutaneous fat stores, unchanged. Cardiovascular: Heart sounds show a regular rate, and rhythm. No gallops or rubs. No murmurs. No JVD. Unchanged exam. Lungs: Clear to auscultation bilaterally with decreased breath sounds. No rales, rhonchi or wheezes. Abdomen: Soft, nontender, nondistended with normal active bowel sounds. No masses. No hepatosplenomegaly. Unchanged exam. Neurological: Alert but  not fully oriented---unable to answer questions regarding where he is, or what season it is, remains slow to respond. Moves all extremities 4 with decreased strength. Cranial nerves II through XII grossly intact. Unchanged.  Skin: Warm and dry. No rashes or lesions. Unchanged. Extremities: No clubbing or cyanosis. No edema. Pedal pulses 2+. Unchanged. Psychiatric: Mood and affect are flat. Insight and judgment are impaired. Unchanged.  Pressure Injury 12/05/20 Coccyx Medial Stage 2 -  Partial thickness loss of dermis presenting as a shallow open injury with a red, pink wound bed without slough. 9mm diameter open circular abrasion, top layer skin not intact, red and blanchable, superior (Active)  12/05/20 2145  Location: Coccyx  Location Orientation: Medial  Staging: Stage 2 -  Partial thickness loss of dermis presenting as a shallow open injury with a red, pink wound bed without slough.  Wound Description (Comments): 55mm diameter open circular abrasion, top layer skin not intact, red and blanchable, superior to medial coccyx  Present on Admission: Yes     Pressure Injury 12/22/20 Ankle Anterior;Right Deep Tissue Pressure Injury - Purple or maroon localized area of discolored intact skin or blood-filled blister due to damage of underlying soft tissue from pressure and/or shear. purple ecchymosis 1.8x1.5cm  (Active)  12/22/20 1720  Location: Ankle  Location Orientation: Anterior;Right  Staging: Deep Tissue Pressure Injury - Purple or maroon localized area of discolored intact skin or blood-filled blister due to damage of underlying soft tissue from pressure and/or shear.  Wound Description (Comments): purple ecchymosis 1.8x1.5cm near maleolus  Present on Admission: No     Pressure Injury 12/22/20 Heel Left Deep Tissue Pressure Injury - Purple or maroon localized area of discolored intact skin or blood-filled blister due to damage of underlying soft tissue from pressure and/or shear. 9x6cm dark  pink/ red area across heel (Active)  12/22/20 1720  Location: Heel  Location Orientation: Left  Staging: Deep Tissue Pressure Injury - Purple or maroon localized area of discolored intact skin or blood-filled blister due to damage of underlying soft tissue from pressure and/or shear.  Wound Description (Comments): 9x6cm dark pink/ red area across heel  Present on Admission: No     Pressure Injury 12/22/20 Heel Right Deep Tissue Pressure Injury - Purple or maroon localized area of discolored intact skin or blood-filled blister due to damage of underlying soft tissue from pressure and/or shear. 4x7cm dark purple area across heel (Active)  12/22/20 1720  Location: Heel  Location Orientation: Right  Staging: Deep Tissue Pressure Injury - Purple or maroon localized area of discolored intact skin or blood-filled blister due to damage of underlying soft tissue from pressure and/or shear.  Wound Description (Comments): 4x7cm dark purple area across heel  Present on Admission: No        Data Reviewed:   I have personally reviewed following labs and imaging studies:  Labs: Labs show the following:   Basic Metabolic Panel: Recent Labs  Lab 12/20/20 0129 12/21/20 [id number] 12/22/20 0323 12/23/20 0254  12/25/20 0404  NA 132* 133* 134* 133* 136  K 4.4 4.0 4.1 4.0 4.2  CL 93* 95* 96* 94* 97*  CO2 28 28 26 26 27   GLUCOSE 116* 93 87 105* 88  BUN 14 15 21  24* 23  CREATININE 0.75 0.70 0.68 0.65 0.66  CALCIUM 8.2* 8.1* 7.9* 8.4* 8.4*  MG 1.8 1.7 1.8  --   --   PHOS 2.6 3.5 2.9  --   --    GFR Estimated Creatinine Clearance: 67.8 mL/min (by C-G formula based on SCr of 0.66 mg/dL). Liver Function Tests: Recent Labs  Lab 12/20/20 0129 12/21/20 0613 12/22/20 0323  AST 47* 42* 46*  ALT 22 25 32  ALKPHOS 54 46 53  BILITOT 0.7 0.7 0.6  PROT 6.1* 5.4* 5.5*  ALBUMIN 2.2* 1.9* 1.8*   CBC: Recent Labs  Lab 12/20/20 0129 12/21/20 0613 12/21/20 1204 12/21/20 2052 12/22/20 0323  12/23/20 0254 12/24/20 0139 12/25/20 0404  WBC 9.7 10.0  --   --  9.9 11.4* 12.3* 10.7*  NEUTROABS 7.1 7.1  --   --  6.9  --   --   --   HGB 8.8* 7.2*   < > 8.5* 8.4* 9.2* 9.9* 9.1*  HCT 27.8* 22.0*   < > 25.5* 25.6* 29.2* 30.2* 28.3*  MCV 94.6 94.0  --   --  91.4 93.6 92.6 93.4  PLT 333 338  --   --  330 394 454* 430*   < > = values in this interval not displayed.   CBG: Recent Labs  Lab 12/19/20 1238 12/19/20 1639  GLUCAP 103* 106*    Microbiology Recent Results (from the past 240 hour(s))  Culture, blood (routine x 2)     Status: None   Collection Time: 12/17/20  2:50 AM   Specimen: BLOOD  Result Value Ref Range Status   Specimen Description BLOOD RIGHT ANTECUBITAL  Final   Special Requests   Final    BOTTLES DRAWN AEROBIC AND ANAEROBIC Blood Culture adequate volume   Culture   Final    NO GROWTH 5 DAYS Performed at [facility], [name] [place], [place] [address]    Report Status 12/22/2020 FINAL  Final  Culture, blood (routine x 2)     Status: Abnormal   Collection Time: 12/17/20  3:01 AM   Specimen: BLOOD RIGHT ARM  Result Value Ref Range Status   Specimen Description BLOOD RIGHT ARM  Final   Special Requests AEROBIC BOTTLE ONLY Blood Culture adequate volume  Final   Culture  Setup Time   Final    GRAM POSITIVE COCCI IN CLUSTERS AEROBIC BOTTLE ONLY Organism ID to follow CRITICAL RESULT CALLED TO, READ BACK BY AND VERIFIED WITH: PHARMD M [place] [id number] MLM    Culture (A)  Final    STAPHYLOCOCCUS CAPITIS THE SIGNIFICANCE OF ISOLATING THIS ORGANISM FROM A SINGLE SET OF BLOOD CULTURES WHEN MULTIPLE SETS ARE DRAWN IS UNCERTAIN. PLEASE NOTIFY THE MICROBIOLOGY DEPARTMENT WITHIN ONE WEEK IF SPECIATION AND SENSITIVITIES ARE REQUIRED. Performed at [facility], [place] [address], [place], [place] [address]    Report Status 12/19/2020 FINAL  Final  Blood Culture ID Panel (Reflexed)     Status: Abnormal   Collection Time: 12/17/20  3:01 AM  Result  Value Ref Range Status   Enterococcus faecalis NOT DETECTED NOT DETECTED Final   Enterococcus Faecium NOT DETECTED NOT DETECTED Final   Listeria monocytogenes NOT DETECTED NOT DETECTED Final  Staphylococcus species DETECTED (A) NOT DETECTED Final    Comment: CRITICAL RESULT CALLED TO, READ BACK BY AND VERIFIED WITH: PHARMD M [place] [id number] MLM    Staphylococcus aureus (BCID) NOT DETECTED NOT DETECTED Final   Staphylococcus epidermidis NOT DETECTED NOT DETECTED Final   Staphylococcus lugdunensis NOT DETECTED NOT DETECTED Final   Streptococcus species NOT DETECTED NOT DETECTED Final   Streptococcus agalactiae NOT DETECTED NOT DETECTED Final   Streptococcus pneumoniae NOT DETECTED NOT DETECTED Final   Streptococcus pyogenes NOT DETECTED NOT DETECTED Final   A.calcoaceticus-baumannii NOT DETECTED NOT DETECTED Final   Bacteroides fragilis NOT DETECTED NOT DETECTED Final   Enterobacterales NOT DETECTED NOT DETECTED Final   Enterobacter cloacae complex NOT DETECTED NOT DETECTED Final   Escherichia coli NOT DETECTED NOT DETECTED Final   Klebsiella aerogenes NOT DETECTED NOT DETECTED Final   Klebsiella oxytoca NOT DETECTED NOT DETECTED Final   Klebsiella pneumoniae NOT DETECTED NOT DETECTED Final   Proteus species NOT DETECTED NOT DETECTED Final   Salmonella species NOT DETECTED NOT DETECTED Final   Serratia marcescens NOT DETECTED NOT DETECTED Final   Haemophilus influenzae NOT DETECTED NOT DETECTED Final   Neisseria meningitidis NOT DETECTED NOT DETECTED Final   Pseudomonas aeruginosa NOT DETECTED NOT DETECTED Final   Stenotrophomonas maltophilia NOT DETECTED NOT DETECTED Final   Candida albicans NOT DETECTED NOT DETECTED Final   Candida auris NOT DETECTED NOT DETECTED Final   Candida glabrata NOT DETECTED NOT DETECTED Final   Candida krusei NOT DETECTED NOT DETECTED Final   Candida parapsilosis NOT DETECTED NOT DETECTED Final   Candida tropicalis NOT DETECTED NOT DETECTED Final    Cryptococcus neoformans/gattii NOT DETECTED NOT DETECTED Final    Comment: Performed at [facility], [address], [place], [place] [address]  Culture, Urine     Status: Abnormal   Collection Time: 12/17/20  3:11 AM   Specimen: Urine, Random  Result Value Ref Range Status   Specimen Description URINE, RANDOM  Final   Special Requests   Final    NONE Performed at [facility], [place] [address] [place], [place] [address]    Culture >=100,000 COLONIES/mL KLEBSIELLA PNEUMONIAE (A)  Final   Report Status 12/19/2020 FINAL  Final   Organism ID, Bacteria KLEBSIELLA PNEUMONIAE (A)  Final      Susceptibility   Klebsiella pneumoniae - MIC*    AMPICILLIN RESISTANT Resistant     CEFAZOLIN <=4 SENSITIVE Sensitive     CEFEPIME <=0.12 SENSITIVE Sensitive     CEFTRIAXONE <=0.25 SENSITIVE Sensitive     CIPROFLOXACIN <=0.25 SENSITIVE Sensitive     GENTAMICIN <=1 SENSITIVE Sensitive     IMIPENEM <=0.25 SENSITIVE Sensitive     NITROFURANTOIN <=16 SENSITIVE Sensitive     TRIMETH/SULFA <=20 SENSITIVE Sensitive     AMPICILLIN/SULBACTAM <=2 SENSITIVE Sensitive     PIP/TAZO <=4 SENSITIVE Sensitive     * >=100,000 COLONIES/mL KLEBSIELLA PNEUMONIAE  Resp Panel by RT-PCR (Flu A&B, Covid) Nasopharyngeal Swab     Status: None   Collection Time: 12/19/20  1:10 PM   Specimen: Nasopharyngeal Swab; Nasopharyngeal(NP) swabs in vial transport medium  Result Value Ref Range Status   SARS Coronavirus 2 by RT PCR NEGATIVE NEGATIVE Final    Comment: (NOTE) SARS-CoV-2 target nucleic acids are NOT DETECTED.  The SARS-CoV-2 RNA is generally detectable in upper respiratory specimens during the acute phase of infection. The lowest concentration of SARS-CoV-2 viral copies this assay can detect is 138 copies/mL. A negative  result does not preclude SARS-Cov-2 infection and should not be used as the sole basis for treatment or other patient management decisions. A negative result may occur with  improper  specimen collection/handling, submission of specimen other than nasopharyngeal swab, presence of viral mutation(s) within the areas targeted by this assay, and inadequate number of viral copies(<138 copies/mL). A negative result must be combined with clinical observations, patient history, and epidemiological information. The expected result is Negative.  Fact Sheet for Patients:  EntrepreneurPulse.com.au  Fact Sheet for Healthcare Providers:  IncredibleEmployment.be  This test is no t yet approved or cleared by the [place] FDA and  has been authorized for detection and/or diagnosis of SARS-CoV-2 by FDA under an Emergency Use Authorization (EUA). This EUA will remain  in effect (meaning this test can be used) for the duration of the COVID-19 declaration under Section 564(b)(1) of the Act, 21 U.S.C.section 360bbb-3(b)(1), unless the authorization is terminated  or revoked sooner.       Influenza A by PCR NEGATIVE NEGATIVE Final   Influenza B by PCR NEGATIVE NEGATIVE Final    Comment: (NOTE) The Xpert Xpress SARS-CoV-2/FLU/RSV plus assay is intended as an aid in the diagnosis of influenza from Nasopharyngeal swab specimens and should not be used as a sole basis for treatment. Nasal washings and aspirates are unacceptable for Xpert Xpress SARS-CoV-2/FLU/RSV testing.  Fact Sheet for Patients: EntrepreneurPulse.com.au  Fact Sheet for Healthcare Providers: IncredibleEmployment.be  This test is not yet approved or cleared by the [place] FDA and has been authorized for detection and/or diagnosis of SARS-CoV-2 by FDA under an Emergency Use Authorization (EUA). This EUA will remain in effect (meaning this test can be used) for the duration of the COVID-19 declaration under Section 564(b)(1) of the Act, 21 U.S.C. section 360bbb-3(b)(1), unless the authorization is terminated or revoked.  Performed at  [facility], [place] [address] [place], [place] [address]     Procedures and diagnostic studies:  No results found.  Medications:   . (feeding supplement) PROSource Plus  30 mL Oral BID BM  . amLODipine  2.5 mg Oral Daily  . atorvastatin  20 mg Oral QHS  . divalproex  500 mg Oral Q12H  . feeding supplement  237 mL Oral QID  . FLUoxetine  40 mg Oral Daily  . lubriderm seriously sensitive   Topical Daily  . mirtazapine  30 mg Oral QHS  . multivitamin with minerals  1 tablet Oral Daily  . tamsulosin  0.4 mg Oral QHS   Continuous Infusions:   LOS: 21 days   Jacquelynn Cree, MD  [facility]   [facility] How to contact the [facility] Attending or Consulting provider [place] or covering provider during after hours [place], for this patient?  1. Check the care team in [facility] and look for a) attending/consulting TRH provider listed and b) the [facility] team listed 2. Log into [email or web address] and use [place] universal password to access. If you do not have the password, please contact the hospital operator. 3. Locate the [facility] provider you are looking for under [facility] and page to a number that you can be directly reached. 4. If you still have difficulty reaching the provider, please page the [facility] (Director on Call) for the Hospitalists listed on amion for assistance.  12/26/2020, 7:23 AM

## 2020-12-26 NOTE — Progress Notes (Signed)
Nutrition Follow-up  DOCUMENTATION CODES:   Underweight  INTERVENTION:   Continue Magic cup TID with meals, each supplement provides 290 kcal and 9 grams of protein  Continue Ensure Enlive/Plus poQID, each supplement provides 350 kcal and 20 grams of protein (Ensure Plus provides only 13 grams protein)  Continue42ml Prosource Plus po BID, each supplement provides 100 kcals and 15 grams of protein  Continue MVI daily  Continue to assist pt with meals   NUTRITION DIAGNOSIS:   Inadequate oral intake related to poor appetite as evidenced by meal completion < 50%.  ongoing  GOAL:   Patient will meet greater than or equal to 90% of their needs  progressing  MONITOR:   PO intake,Supplement acceptance,Diet advancement,Weight trends,Labs,I & O's  REASON FOR ASSESSMENT:   Consult Enteral/tube feeding initiation and management  ASSESSMENT:   Pt admitted with breakthrough seizures. PMH includes prostate cancer, depression, type 2 DM, HTN, HLD, seizure, substance abuse. Pt bedbound at baseline.  Pt now pending d/c to SNF.   Pt's intake has been varied since last RD assessment. Meal completions charted as 10-95% x last 8 recorded meals (51% average meal intake). Pt is receiving Magic Cup TID, Ensure Enlive/Plus poQID, and64ml Prosource Plus po BID. Per RN, pt continues to do well with supplement consumption. Will continue with current nutrition plan of care.   UOP: 533ml x 24 hours   Labs reviewed. Medications: remeron, mvi with minerals  Diet Order:   Diet Order            DIET - DYS 1 Room service appropriate? Yes; Fluid consistency: Thin  Diet effective now                 EDUCATION NEEDS:   No education needs have been identified at this time  Skin:  Skin Assessment: Skin Integrity Issues: Skin Integrity Issues:: Stage II Stage II: coccyx  Last BM:  1/3  Height:   Ht Readings from Last 1 Encounters:  12/04/20 6\' 1"  (1.854 m)    Weight:   Wt  Readings from Last 1 Encounters:  12/04/20 58.3 kg   BMI:  Body mass index is 16.96 kg/m.  Estimated Nutritional Needs:   Kcal:  1950-2150  Protein:  95-105 grams  Fluid:  >1.95L/d   Larkin Ina, MS, RD, LDN RD pager number and weekend/on-call pager number located in [place].

## 2020-12-27 DIAGNOSIS — R569 Unspecified convulsions: Secondary | ICD-10-CM | POA: Diagnosis not present

## 2020-12-27 MED ORDER — NYSTATIN 100000 UNIT/ML MT SUSP
5.0000 mL | Freq: Four times a day (QID) | OROMUCOSAL | Status: DC
  Administered 2020-12-27 – 2020-12-30 (×12): 500000 [IU] via ORAL
  Filled 2020-12-27 (×12): qty 5

## 2020-12-27 MED ORDER — TEMAZEPAM 7.5 MG PO CAPS: 7.5000 mg | ORAL_CAPSULE | Freq: Every evening | ORAL | 0 refills | Status: DC | PRN

## 2020-12-27 MED ORDER — NYSTATIN 100000 UNIT/ML MT SUSP: 5.0000 mL | Freq: Four times a day (QID) | OROMUCOSAL | 0 refills | Status: DC

## 2020-12-27 MED ORDER — DIVALPROEX SODIUM 125 MG PO CSDR: 500.0000 mg | DELAYED_RELEASE_CAPSULE | Freq: Two times a day (BID) | ORAL | 3 refills | Status: DC

## 2020-12-27 MED ORDER — ADULT MULTIVITAMIN W/MINERALS CH: 1.0000 | ORAL_TABLET | Freq: Every day | ORAL | 1 refills | Status: AC

## 2020-12-27 NOTE — TOC Progression Note (Signed)
Transition of Care [facility]) - Progression Note    Patient Details  Name: Brad [name] MRN: [id number] Date of Birth: 11-Mar-1946  Transition of Care [facility]) CM/SW [place],  Phone Number: 12/27/2020, 10:07 AM  Clinical Narrative:   CSW checked in with Loie at [facility] and there is no bed available for patient today. CSW updated MD. CSW to follow.    Expected Discharge Plan: [place] Barriers to Discharge: Hospice Bed not available  Expected Discharge Plan and Services Expected Discharge Plan: [facility] In-house Referral: Clinical Social Work Discharge Planning Services: CM Consult   Living arrangements for the past 2 months: Single Family Home Expected Discharge Date: 12/27/20                                     Social Determinants of Health (SDOH) Interventions    Readmission Risk Interventions No flowsheet data found.

## 2020-12-27 NOTE — Discharge Summary (Signed)
Physician Discharge Summary  Brad [name] [id number] DOB: 12-Feb-1946 DOA: 12/04/2020  PCP: Brad Moro, DO  Admit date: 12/04/2020 Discharge date: 12/27/2020  Admitted From: Home Disposition: SNF  Recommendations for Outpatient Follow-up:  1. Follow up with PCP in 1-2 weeks 2. Please obtain BMP/CBC in one week 3. Follow up with neurology, for further care of seizure.      Discharge Condition: stable.  CODE STATUS: DNR Diet recommendation:  Dysphagia 1 diet.   Brief/Interim Summary: 75 year old with past medical history significant for prostate cancer, depression, diabetes type 2, hypertension, hyperlipidemia, seizure disorder, substance abuse as well as other comorbidities who presented to the emergency room with altered mental status.  Upon arrival to the ED he was noted to be hypertensive with a systolic blood pressure of 190s but the rest of his vital signs were stable.  White blood cell was 9.9 and blood glucose was 75.  CT head showed no acute intracranial abnormality.  CTA of the head neck showed normal CT perfusion study and unchanged 60% stenosis of the proximal left internal carotid artery secondary to mixed density atherosclerosis.  He was seen by neurology and after speaking with family the patient's presentation was felt to be secondary to seizure activity at home.  Given IV valproic acid.  Patient has been bedbound since 2018.  During his hospital course he has poor oral intake and palliative care was consulted.  Patient's daughter made her father DNR and it  was noted that patient's living will documents that he does not want artificial means of feeding.  Initially after palliative care discussed with  family was inclined toward comfort care and hospice placement but then she  changed her mind and wanted aggressive care.  Patient was being worked up for SNF placement.  Hospitalization further complicated by UTI Klebsiella as well as downward trend in hemoglobin without  overt blood loss.   Notified  by [facility] that patient's insurance denied SNF.  Family appealed and won, so SNF placement now pending.   1-Breakthrough Seizure: Likely secondary to medication noncompliance Continue with Depakote, no seizure events noted. Appreciate neurology consult  2-Cachexia/poor oral intake/severe protein caloric malnutrition/underweight/hypoalbuminemia; Agree with below.  Nutrition Status: Nutrition Problem: Inadequate oral intake Etiology: poor appetite Signs/Symptoms: meal completion < 50% Interventions: MVI,Boost Breeze,Prostat,Refer to RD note for recommendations  3-Klebsiella UTI: Completed antibiotic therapy  4-Essential hypertension: Continue with amlodipine 5-Hyperlipidemia: Continue Lipitor 6-Normocytic anemia, anemia of chronic disease: Baseline hemoglobin has ranged between 7 and 9 and had slowed down over the trend to 6.9. No overt GI bleed. S/P 1 unit of packed red blood cells on 1//2032 Continue to monitor hemoglobin. 7-advanced dementia: Bedbound since 2018 8-Leukocytosis: Reactive.  Afebrile.  Multiple pressure wounds as documented below In agreement with assessment of the pressure ulcer as below: Pressure Injury 12/05/20 Coccyx Medial Stage 2 - Partial thickness loss of dermis presenting as a shallow open injury with a red, pink wound bed without slough. 45mm diameter open circular abrasion, top layer skin not intact, red and blanchable, superior (Active)  12/05/20 2145  Location: Coccyx  Location Orientation: Medial  Staging: Stage 2 - Partial thickness loss of dermis presenting as a shallow open injury with a red, pink wound bed without slough.  Wound Description (Comments): 56mm diameter open circular abrasion, top layer skin not intact, red and blanchable, superior to medial coccyx  Present on Admission: Yes    Pressure Injury 12/22/20 Ankle Anterior;Right Deep Tissue Pressure Injury - Purple or maroon localized area  of discolored  intact skin or blood-filled blister due to damage of underlying soft tissue from pressure and/or shear. purple ecchymosis 1.8x1.5cm (Active)  12/22/20 1720  Location: Ankle  Location Orientation: Anterior;Right  Staging: Deep Tissue Pressure Injury - Purple or maroon localized area of discolored intact skin or blood-filled blister due to damage of underlying soft tissue from pressure and/or shear.  Wound Description (Comments): purple ecchymosis 1.8x1.5cm near maleolus  Present on Admission: No    Pressure Injury 12/22/20 Heel Left Deep Tissue Pressure Injury - Purple or maroon localized area of discolored intact skin or blood-filled blister due to damage of underlying soft tissue from pressure and/or shear. 9x6cm dark pink/ red area across heel (Active)  12/22/20 1720  Location: Heel  Location Orientation: Left  Staging: Deep Tissue Pressure Injury - Purple or maroon localized area of discolored intact skin or blood-filled blister due to damage of underlying soft tissue from pressure and/or shear.  Wound Description (Comments): 9x6cm dark pink/ red area across heel  Present on Admission: No    Pressure Injury 12/22/20 Heel Right Deep Tissue Pressure Injury - Purple or maroon localized area of discolored intact skin or blood-filled blister due to damage of underlying soft tissue from pressure and/or shear. 4x7cm dark purple area across heel (Active)  12/22/20 1720  Location: Heel  Location Orientation: Right  Staging: Deep Tissue Pressure Injury - Purple or maroon localized area of discolored intact skin or blood-filled blister due to damage of underlying soft tissue from pressure and/or shear.  Wound Description (Comments): 4x7cm dark purple area across heel  Present on Admission: No          Discharge Diagnoses:  Principal Problem:   Seizure ([name]) Active Problems:   Hypertension   Hyperlipidemia   Breakthrough seizure ([name])   Pressure injury of skin   Cachexia  [facility])      Discharge Instructions  Discharge Instructions    Diet - low sodium heart healthy   Complete by: As directed    Discharge wound care:   Complete by: As directed    See above   Increase activity slowly   Complete by: As directed      Allergies as of 12/27/2020   No Known Allergies     Medication List    STOP taking these medications   divalproex 500 MG 24 hr tablet Commonly known as: DEPAKOTE ER Replaced by: divalproex 125 MG capsule   meloxicam 15 MG tablet Commonly known as: MOBIC     TAKE these medications   acetaminophen 500 MG tablet Commonly known as: TYLENOL Take 1,000 mg by mouth every 6 (six) hours as needed for moderate pain.   amLODipine 2.5 MG tablet Commonly known as: NORVASC Take 1 tablet (2.5 mg total) by mouth daily.   atorvastatin 20 MG tablet Commonly known as: LIPITOR Take 1 tablet (20 mg total) by mouth daily. What changed: when to take this   divalproex 125 MG capsule Commonly known as: DEPAKOTE SPRINKLE Take 4 capsules (500 mg total) by mouth every 12 (twelve) hours. Replaces: divalproex 500 MG 24 hr tablet   FLUoxetine 20 MG capsule Commonly known as: PROZAC Take 2 capsules (40 mg total) by mouth daily. What changed: Another medication with the same name was removed. Continue taking this medication, and follow the directions you see here.   mirtazapine 30 MG tablet Commonly known as: REMERON Take 30 mg by mouth at bedtime.   multivitamin with minerals Tabs tablet Take 1 tablet by mouth  daily.   nystatin 100000 UNIT/ML suspension Commonly known as: MYCOSTATIN Take 5 mLs (500,000 Units total) by mouth 4 (four) times daily.   tamsulosin 0.4 MG Caps capsule Commonly known as: FLOMAX Take 0.4 mg by mouth at bedtime.   temazepam 7.5 MG capsule Commonly known as: RESTORIL Take 1 capsule (7.5 mg total) by mouth at bedtime as needed for sleep.            Durable Medical Equipment  (From admission, onward)          Start     Ordered   12/20/20 1226  For home use only DME Hospital bed  Once       Question Answer Comment  Length of Need 6 Months   The above medical condition requires: Patient requires the ability to reposition frequently   Bed type Semi-electric   Hoyer Lift Yes   Support Surface: Low Air loss Mattress      12/20/20 1225           Discharge Care Instructions  (From admission, onward)         Start     Ordered   12/27/20 0000  Discharge wound care:       Comments: See above   12/27/20 [id number]          Contact information for follow-up providers    Brad Moro, DO Follow up in 1 week(s).   Specialty: General Practice Contact information: [place] [address] [phone]            Contact information for after-discharge care    Destination    [facility] .   Service: Skilled Nursing Contact information: [place] [phone]                 No Known Allergies  Consultations:  Palliative care  Neurology   Procedures/Studies: EEG  Result Date: 12/11/2020 Lora Havens, MD     12/11/2020  1:24 PM Patient Name: Brad [name] MRN: [id number] Epilepsy Attending: Lora Havens Referring Physician/Provider: Dr Early Osmond Date: 12/11/2020 Duration: 23.53 mins Patient history: 75yo M with dementia and seizures. EEG to evaluate for seizure Level of alertness: Awake AEDs during EEG study: VPA Technical aspects: This EEG study was done with scalp electrodes positioned according to the 10-20 International system of electrode placement. Electrical activity was acquired at a sampling rate of 500Hz  and reviewed with a high frequency filter of 70Hz  and a low frequency filter of 1Hz . EEG data were recorded continuously and digitally stored. Description: The posterior dominant rhythm consists of 8 Hz activity of moderate voltage (25-35 uV) seen predominantly in posterior  head regions, symmetric and reactive to eye opening and eye closing. EEG showed intermittent generalized 3 to 6 Hz theta-delta slowing.Spike was seen in left temporoparietal region.   Hyperventilation and photic stimulation were not performed.   ABNORMALITY -Intermittent slow, generalized -Spike, left temporoparietal region. IMPRESSION: This study showed evidence of epileptogenicity arising left temporoparietal region as well as mild diffuse encephalopathy, nonspecific etiology. No seizures were seen throughout the recording. Lora Havens   EEG  Result Date: 12/05/2020 Lora Havens, MD     12/05/2020  3:40 PM Patient Name: Brad [name] MRN: [id number] Epilepsy Attending: Lora Havens Referring Physician/Provider: Dr Derrick Ravel Date: 12/05/2020 Duration: 25.58 mins Patient history: 75 y.o.malewith a PMHx of prostate cancer, anemia, arthritis, dementia, depression, DM, HTN, seizures on Depakote, and substance  abuse who presents with acute onset of AMS and right side weakness. EEG to evaluate for seizure. Level of alertness: Awake AEDs during EEG study: VPA Technical aspects: This EEG study was done with scalp electrodes positioned according to the 10-20 International system of electrode placement. Electrical activity was acquired at a sampling rate of 500Hz  and reviewed with a high frequency filter of 70Hz  and a low frequency filter of 1Hz . EEG data were recorded continuously and digitally stored. Description: No posterior dominant rhythm was seen. EEG showed continuous generalized and maximal bilateral posterior quadrant 3 to 6 Hz theta-delta slowing. Spikes were also noted in left>right parieto-occipital region, at times periodic at 1.5-2hz  as well as rhythmic. Two seizures were noted at 1120 and 1131 during which patient was noted to have right arm rhythmic jerking, lasted about 22 and 24 seconds respectively. Concomitant eeg showed 2-3Hz  delta slowing in left>right parieto-occipital  region which evolved into 5-6Hz  theta slowing. Hyperventilation did not show any EEG change.  Physiology photic driving was not seen during photic stimulation.  Hyperventilation and photic stimulation were not performed.   ABNORMALITY - Focal Motor seizure, left>right parieto-occipital region -Continuous slow, generalized and maximal bilateral posterior quadrant -Spike,left>right parieto-occipital region IMPRESSION: This study showed two focal motor seizure during which patient was noted to have right arm rhythmic twitching, arising from left>right parieto-occipital region at 1120 and 1131, lasted about 22 and 24 seconds respectively. Additionally, there was cortical dysfunction in bilateral posterior quadrant suggestive of underlying structural abnormality, PRES. There is also moderate diffuse encephalopathy, non specific to etiology. Dr Theda Sers was notified. Lora Havens   CT CEREBRAL PERFUSION W CONTRAST  Result Date: 12/04/2020 CLINICAL DATA:  Right-sided weakness EXAM: CT ANGIOGRAPHY HEAD AND NECK CT PERFUSION BRAIN TECHNIQUE: Multidetector CT imaging of the head and neck was performed using the standard protocol during bolus administration of intravenous contrast. Multiplanar CT image reconstructions and MIPs were obtained to evaluate the vascular anatomy. Carotid stenosis measurements (when applicable) are obtained utilizing NASCET criteria, using the distal internal carotid diameter as the denominator. Multiphase CT imaging of the brain was performed following IV bolus contrast injection. Subsequent parametric perfusion maps were calculated using RAPID software. CONTRAST:  148mL OMNIPAQUE IOHEXOL 350 MG/ML SOLN COMPARISON:  11/24/2017 CTA head neck FINDINGS: CTA NECK FINDINGS SKELETON: There is no bony spinal canal stenosis. No lytic or blastic lesion. OTHER NECK: Normal pharynx, larynx and major salivary glands. No cervical lymphadenopathy. Unremarkable thyroid gland. UPPER CHEST: No pneumothorax  or pleural effusion. No nodules or masses. AORTIC ARCH: There is calcific atherosclerosis of the aortic arch. There is no aneurysm, dissection or hemodynamically significant stenosis of the visualized portion of the aorta. Conventional 3 vessel aortic branching pattern. The visualized proximal subclavian arteries are widely patent. RIGHT CAROTID SYSTEM: Normal without aneurysm, dissection or stenosis. LEFT CAROTID SYSTEM: No dissection, occlusion or aneurysm. There is mixed density atherosclerosis extending into the proximal ICA, resulting in unchanged 60% stenosis. VERTEBRAL ARTERIES: Codominant configuration. Both origins are clearly patent. There is no dissection, occlusion or flow-limiting stenosis to the skull base (V1-V3 segments). CTA HEAD FINDINGS POSTERIOR CIRCULATION: --Vertebral arteries: Normal V4 segments. --Inferior cerebellar arteries: Normal. --Basilar artery: Normal. --Superior cerebellar arteries: Normal. --Posterior cerebral arteries (PCA): Normal. ANTERIOR CIRCULATION: --Intracranial internal carotid arteries: Normal. --Anterior cerebral arteries (ACA): Normal. Both A1 segments are present. Patent anterior communicating artery (a-comm). --Middle cerebral arteries (MCA): Normal. VENOUS SINUSES: As permitted by contrast timing, patent. ANATOMIC VARIANTS: Fetal origins of both posterior cerebral arteries. Review of  the MIP images confirms the above findings. CT Brain Perfusion Findings: ASPECTS: 10 CBF (<30%) Volume: 55mL Perfusion (Tmax>6.0s) volume: 42mL Mismatch Volume: 70mL Infarction Location:None IMPRESSION: 1. No emergent large vessel occlusion or high-grade stenosis of the intracranial arteries. 2. Unchanged 60% stenosis of the proximal left internal carotid artery secondary to mixed density atherosclerosis. 3. Normal CT perfusion. Aortic Atherosclerosis (ICD10-I70.0). Electronically Signed   By: Ulyses Jarred M.D.   On: 12/04/2020 23:50   DG CHEST PORT 1 VIEW  Result Date:  12/17/2020 CLINICAL DATA:  Code stroke.  Shortness of breath. EXAM: PORTABLE CHEST 1 VIEW COMPARISON:  05/29/2020 FINDINGS: Cardiac silhouette is normal in size. No mediastinal or hilar masses. No evidence of adenopathy. Small subtle area opacity in the right upper lobe just superior and lateral to the right hilum. Remainder of the lungs is clear. No convincing pleural effusion or pneumothorax. Skeletal structures are grossly intact. IMPRESSION: 1. Small focus of opacity in the central right upper lobe, most likely atelectasis. Consider a focus of pneumonia if there are consistent clinical findings. No other evidence of acute cardiopulmonary disease. Electronically Signed   By: Lajean Manes M.D.   On: 12/17/2020 08:58   CT HEAD CODE STROKE WO CONTRAST  Result Date: 12/04/2020 CLINICAL DATA:  Code stroke.  Right-sided weakness EXAM: CT HEAD WITHOUT CONTRAST TECHNIQUE: Contiguous axial images were obtained from the base of the skull through the vertex without intravenous contrast. COMPARISON:  Head CT 05/29/2020 FINDINGS: Brain: There is no mass, hemorrhage or extra-axial collection. There is generalized atrophy without lobar predilection. Hypodensity of the white matter is most commonly associated with chronic microvascular disease. Multiple old small vessel infarcts of the basal ganglia. Vascular: No abnormal hyperdensity of the major intracranial arteries or dural venous sinuses. No intracranial atherosclerosis. Skull: The visualized skull base, calvarium and extracranial soft tissues are normal. Sinuses/Orbits: No fluid levels or advanced mucosal thickening of the visualized paranasal sinuses. No mastoid or middle ear effusion. The orbits are normal. ASPECTS ([place] Stroke Program Early CT Score) - Ganglionic level infarction (caudate, lentiform nuclei, internal capsule, insula, M1-M3 cortex): 7 - Supraganglionic infarction (M4-M6 cortex): 3 Total score (0-10 with 10 being normal): 10 Review of the MIP  images confirms the above findings IMPRESSION: 1. No acute intracranial abnormality. 2. ASPECTS is 10. These results were communicated to Dr. Kerney Elbe at 11:23 pm on 12/04/2020 by text page via the [facility] messaging system. Electronically Signed   By: Ulyses Jarred M.D.   On: 12/04/2020 23:23   CT ANGIO HEAD CODE STROKE  Result Date: 12/04/2020 CLINICAL DATA:  Right-sided weakness EXAM: CT ANGIOGRAPHY HEAD AND NECK CT PERFUSION BRAIN TECHNIQUE: Multidetector CT imaging of the head and neck was performed using the standard protocol during bolus administration of intravenous contrast. Multiplanar CT image reconstructions and MIPs were obtained to evaluate the vascular anatomy. Carotid stenosis measurements (when applicable) are obtained utilizing NASCET criteria, using the distal internal carotid diameter as the denominator. Multiphase CT imaging of the brain was performed following IV bolus contrast injection. Subsequent parametric perfusion maps were calculated using RAPID software. CONTRAST:  139mL OMNIPAQUE IOHEXOL 350 MG/ML SOLN COMPARISON:  11/24/2017 CTA head neck FINDINGS: CTA NECK FINDINGS SKELETON: There is no bony spinal canal stenosis. No lytic or blastic lesion. OTHER NECK: Normal pharynx, larynx and major salivary glands. No cervical lymphadenopathy. Unremarkable thyroid gland. UPPER CHEST: No pneumothorax or pleural effusion. No nodules or masses. AORTIC ARCH: There is calcific atherosclerosis of the aortic arch. There is no  aneurysm, dissection or hemodynamically significant stenosis of the visualized portion of the aorta. Conventional 3 vessel aortic branching pattern. The visualized proximal subclavian arteries are widely patent. RIGHT CAROTID SYSTEM: Normal without aneurysm, dissection or stenosis. LEFT CAROTID SYSTEM: No dissection, occlusion or aneurysm. There is mixed density atherosclerosis extending into the proximal ICA, resulting in unchanged 60% stenosis. VERTEBRAL ARTERIES:  Codominant configuration. Both origins are clearly patent. There is no dissection, occlusion or flow-limiting stenosis to the skull base (V1-V3 segments). CTA HEAD FINDINGS POSTERIOR CIRCULATION: --Vertebral arteries: Normal V4 segments. --Inferior cerebellar arteries: Normal. --Basilar artery: Normal. --Superior cerebellar arteries: Normal. --Posterior cerebral arteries (PCA): Normal. ANTERIOR CIRCULATION: --Intracranial internal carotid arteries: Normal. --Anterior cerebral arteries (ACA): Normal. Both A1 segments are present. Patent anterior communicating artery (a-comm). --Middle cerebral arteries (MCA): Normal. VENOUS SINUSES: As permitted by contrast timing, patent. ANATOMIC VARIANTS: Fetal origins of both posterior cerebral arteries. Review of the MIP images confirms the above findings. CT Brain Perfusion Findings: ASPECTS: 10 CBF (<30%) Volume: 45mL Perfusion (Tmax>6.0s) volume: 69mL Mismatch Volume: 69mL Infarction Location:None IMPRESSION: 1. No emergent large vessel occlusion or high-grade stenosis of the intracranial arteries. 2. Unchanged 60% stenosis of the proximal left internal carotid artery secondary to mixed density atherosclerosis. 3. Normal CT perfusion. Aortic Atherosclerosis (ICD10-I70.0). Electronically Signed   By: Ulyses Jarred M.D.   On: 12/04/2020 23:50   CT ANGIO NECK CODE STROKE  Result Date: 12/04/2020 CLINICAL DATA:  Right-sided weakness EXAM: CT ANGIOGRAPHY HEAD AND NECK CT PERFUSION BRAIN TECHNIQUE: Multidetector CT imaging of the head and neck was performed using the standard protocol during bolus administration of intravenous contrast. Multiplanar CT image reconstructions and MIPs were obtained to evaluate the vascular anatomy. Carotid stenosis measurements (when applicable) are obtained utilizing NASCET criteria, using the distal internal carotid diameter as the denominator. Multiphase CT imaging of the brain was performed following IV bolus contrast injection. Subsequent  parametric perfusion maps were calculated using RAPID software. CONTRAST:  162mL OMNIPAQUE IOHEXOL 350 MG/ML SOLN COMPARISON:  11/24/2017 CTA head neck FINDINGS: CTA NECK FINDINGS SKELETON: There is no bony spinal canal stenosis. No lytic or blastic lesion. OTHER NECK: Normal pharynx, larynx and major salivary glands. No cervical lymphadenopathy. Unremarkable thyroid gland. UPPER CHEST: No pneumothorax or pleural effusion. No nodules or masses. AORTIC ARCH: There is calcific atherosclerosis of the aortic arch. There is no aneurysm, dissection or hemodynamically significant stenosis of the visualized portion of the aorta. Conventional 3 vessel aortic branching pattern. The visualized proximal subclavian arteries are widely patent. RIGHT CAROTID SYSTEM: Normal without aneurysm, dissection or stenosis. LEFT CAROTID SYSTEM: No dissection, occlusion or aneurysm. There is mixed density atherosclerosis extending into the proximal ICA, resulting in unchanged 60% stenosis. VERTEBRAL ARTERIES: Codominant configuration. Both origins are clearly patent. There is no dissection, occlusion or flow-limiting stenosis to the skull base (V1-V3 segments). CTA HEAD FINDINGS POSTERIOR CIRCULATION: --Vertebral arteries: Normal V4 segments. --Inferior cerebellar arteries: Normal. --Basilar artery: Normal. --Superior cerebellar arteries: Normal. --Posterior cerebral arteries (PCA): Normal. ANTERIOR CIRCULATION: --Intracranial internal carotid arteries: Normal. --Anterior cerebral arteries (ACA): Normal. Both A1 segments are present. Patent anterior communicating artery (a-comm). --Middle cerebral arteries (MCA): Normal. VENOUS SINUSES: As permitted by contrast timing, patent. ANATOMIC VARIANTS: Fetal origins of both posterior cerebral arteries. Review of the MIP images confirms the above findings. CT Brain Perfusion Findings: ASPECTS: 10 CBF (<30%) Volume: 72mL Perfusion (Tmax>6.0s) volume: 60mL Mismatch Volume: 63mL Infarction Location:None  IMPRESSION: 1. No emergent large vessel occlusion or high-grade stenosis of the intracranial arteries. 2. Unchanged  60% stenosis of the proximal left internal carotid artery secondary to mixed density atherosclerosis. 3. Normal CT perfusion. Aortic Atherosclerosis (ICD10-I70.0). Electronically Signed   By: Ulyses Jarred M.D.   On: 12/04/2020 23:50      Subjective: Alert, pleasantly confuse.   Discharge Exam: Vitals:   12/26/20 2357 12/27/20 0353  BP: (!) 148/83 136/65  Pulse: (!) 102 89  Resp: 18 18  Temp: 97.9 F (36.6 C) 99.1 F (37.3 C)  SpO2: 100% 99%     General: Pt is alert, awake, not in acute distress, oral Trush  Cardiovascular: RRR, S1/S2 +, no rubs, no gallops Respiratory: CTA bilaterally, no wheezing, no rhonchi Abdominal: Soft, NT, ND, bowel sounds + Extremities: no edema, no cyanosis    The results of significant diagnostics from this hospitalization (including imaging, microbiology, ancillary and laboratory) are listed below for reference.     Microbiology: Recent Results (from the past 240 hour(s))  Resp Panel by RT-PCR (Flu A&B, Covid) Nasopharyngeal Swab     Status: None   Collection Time: 12/19/20  1:10 PM   Specimen: Nasopharyngeal Swab; Nasopharyngeal(NP) swabs in vial transport medium  Result Value Ref Range Status   SARS Coronavirus 2 by RT PCR NEGATIVE NEGATIVE Final    Comment: (NOTE) SARS-CoV-2 target nucleic acids are NOT DETECTED.  The SARS-CoV-2 RNA is generally detectable in upper respiratory specimens during the acute phase of infection. The lowest concentration of SARS-CoV-2 viral copies this assay can detect is 138 copies/mL. A negative result does not preclude SARS-Cov-2 infection and should not be used as the sole basis for treatment or other patient management decisions. A negative result may occur with  improper specimen collection/handling, submission of specimen other than nasopharyngeal swab, presence of viral mutation(s) within  the areas targeted by this assay, and inadequate number of viral copies(<138 copies/mL). A negative result must be combined with clinical observations, patient history, and epidemiological information. The expected result is Negative.  Fact Sheet for Patients:  EntrepreneurPulse.com.au  Fact Sheet for Healthcare Providers:  IncredibleEmployment.be  This test is no t yet approved or cleared by the [place] FDA and  has been authorized for detection and/or diagnosis of SARS-CoV-2 by FDA under an Emergency Use Authorization (EUA). This EUA will remain  in effect (meaning this test can be used) for the duration of the COVID-19 declaration under Section 564(b)(1) of the Act, 21 U.S.C.section 360bbb-3(b)(1), unless the authorization is terminated  or revoked sooner.       Influenza A by PCR NEGATIVE NEGATIVE Final   Influenza B by PCR NEGATIVE NEGATIVE Final    Comment: (NOTE) The Xpert Xpress SARS-CoV-2/FLU/RSV plus assay is intended as an aid in the diagnosis of influenza from Nasopharyngeal swab specimens and should not be used as a sole basis for treatment. Nasal washings and aspirates are unacceptable for Xpert Xpress SARS-CoV-2/FLU/RSV testing.  Fact Sheet for Patients: EntrepreneurPulse.com.au  Fact Sheet for Healthcare Providers: IncredibleEmployment.be  This test is not yet approved or cleared by the [place] FDA and has been authorized for detection and/or diagnosis of SARS-CoV-2 by FDA under an Emergency Use Authorization (EUA). This EUA will remain in effect (meaning this test can be used) for the duration of the COVID-19 declaration under Section 564(b)(1) of the Act, 21 U.S.C. section 360bbb-3(b)(1), unless the authorization is terminated or revoked.  Performed at [facility], [place] [address], [place], [place] [address]   SARS Coronavirus 2 by RT PCR (hospital order, performed  in [facility]  hospital lab) Nasopharyngeal Nasopharyngeal Swab     Status: None   Collection Time: 12/26/20  9:17 AM   Specimen: Nasopharyngeal Swab  Result Value Ref Range Status   SARS Coronavirus 2 NEGATIVE NEGATIVE Final    Comment: (NOTE) SARS-CoV-2 target nucleic acids are NOT DETECTED.  The SARS-CoV-2 RNA is generally detectable in upper and lower respiratory specimens during the acute phase of infection. The lowest concentration of SARS-CoV-2 viral copies this assay can detect is 250 copies / mL. A negative result does not preclude SARS-CoV-2 infection and should not be used as the sole basis for treatment or other patient management decisions.  A negative result may occur with improper specimen collection / handling, submission of specimen other than nasopharyngeal swab, presence of viral mutation(s) within the areas targeted by this assay, and inadequate number of viral copies (<250 copies / mL). A negative result must be combined with clinical observations, patient history, and epidemiological information.  Fact Sheet for Patients:   StrictlyIdeas.no  Fact Sheet for Healthcare Providers: BankingDealers.co.za  This test is not yet approved or  cleared by the [place] FDA and has been authorized for detection and/or diagnosis of SARS-CoV-2 by FDA under an Emergency Use Authorization (EUA).  This EUA will remain in effect (meaning this test can be used) for the duration of the COVID-19 declaration under Section 564(b)(1) of the Act, 21 U.S.C. section 360bbb-3(b)(1), unless the authorization is terminated or revoked sooner.  Performed at [facility], [place] [address] [place], [place] [address]      Labs: BNP (last 3 results) No results for input(s): BNP in the last 8760 hours. Basic Metabolic Panel: Recent Labs  Lab 12/21/20 0613 12/22/20 0323 12/23/20 0254 12/25/20 0404  NA 133* 134* 133* 136  K 4.0  4.1 4.0 4.2  CL 95* 96* 94* 97*  CO2 28 26 26 27   GLUCOSE 93 87 105* 88  BUN 15 21 24* 23  CREATININE 0.70 0.68 0.65 0.66  CALCIUM 8.1* 7.9* 8.4* 8.4*  MG 1.7 1.8  --   --   PHOS 3.5 2.9  --   --    Liver Function Tests: Recent Labs  Lab 12/21/20 0613 12/22/20 0323  AST 42* 46*  ALT 25 32  ALKPHOS 46 53  BILITOT 0.7 0.6  PROT 5.4* 5.5*  ALBUMIN 1.9* 1.8*   No results for input(s): LIPASE, AMYLASE in the last 168 hours. No results for input(s): AMMONIA in the last 168 hours. CBC: Recent Labs  Lab 12/21/20 0613 12/21/20 1204 12/21/20 2052 12/22/20 0323 12/23/20 0254 12/24/20 0139 12/25/20 0404  WBC 10.0  --   --  9.9 11.4* 12.3* 10.7*  NEUTROABS 7.1  --   --  6.9  --   --   --   HGB 7.2*   < > 8.5* 8.4* 9.2* 9.9* 9.1*  HCT 22.0*   < > 25.5* 25.6* 29.2* 30.2* 28.3*  MCV 94.0  --   --  91.4 93.6 92.6 93.4  PLT 338  --   --  330 394 454* 430*   < > = values in this interval not displayed.   Cardiac Enzymes: No results for input(s): CKTOTAL, CKMB, CKMBINDEX, TROPONINI in the last 168 hours. BNP: Invalid input(s): POCBNP CBG: No results for input(s): GLUCAP in the last 168 hours. D-Dimer No results for input(s): DDIMER in the last 72 hours. Hgb A1c No results for input(s): HGBA1C in the last 72 hours. Lipid Profile No results for input(s):  CHOL, HDL, LDLCALC, TRIG, CHOLHDL, LDLDIRECT in the last 72 hours. Thyroid function studies No results for input(s): TSH, T4TOTAL, T3FREE, THYROIDAB in the last 72 hours.  Invalid input(s): FREET3 Anemia work up No results for input(s): VITAMINB12, FOLATE, FERRITIN, TIBC, IRON, RETICCTPCT in the last 72 hours. Urinalysis    Component Value Date/Time   COLORURINE YELLOW 12/17/2020 0311   APPEARANCEUR HAZY (A) 12/17/2020 0311   LABSPEC 1.012 12/17/2020 0311   PHURINE 8.0 12/17/2020 0311   GLUCOSEU NEGATIVE 12/17/2020 0311   HGBUR SMALL (A) 12/17/2020 0311   HGBUR negative 05/29/2008 1402   BILIRUBINUR NEGATIVE 12/17/2020  0311   KETONESUR NEGATIVE 12/17/2020 0311   PROTEINUR NEGATIVE 12/17/2020 0311   UROBILINOGEN 2.0 (H) 09/17/2020 1417   NITRITE NEGATIVE 12/17/2020 0311   LEUKOCYTESUR MODERATE (A) 12/17/2020 0311   Sepsis Labs Invalid input(s): PROCALCITONIN,  WBC,  LACTICIDVEN Microbiology Recent Results (from the past 240 hour(s))  Resp Panel by RT-PCR (Flu A&B, Covid) Nasopharyngeal Swab     Status: None   Collection Time: 12/19/20  1:10 PM   Specimen: Nasopharyngeal Swab; Nasopharyngeal(NP) swabs in vial transport medium  Result Value Ref Range Status   SARS Coronavirus 2 by RT PCR NEGATIVE NEGATIVE Final    Comment: (NOTE) SARS-CoV-2 target nucleic acids are NOT DETECTED.  The SARS-CoV-2 RNA is generally detectable in upper respiratory specimens during the acute phase of infection. The lowest concentration of SARS-CoV-2 viral copies this assay can detect is 138 copies/mL. A negative result does not preclude SARS-Cov-2 infection and should not be used as the sole basis for treatment or other patient management decisions. A negative result may occur with  improper specimen collection/handling, submission of specimen other than nasopharyngeal swab, presence of viral mutation(s) within the areas targeted by this assay, and inadequate number of viral copies(<138 copies/mL). A negative result must be combined with clinical observations, patient history, and epidemiological information. The expected result is Negative.  Fact Sheet for Patients:  EntrepreneurPulse.com.au  Fact Sheet for Healthcare Providers:  IncredibleEmployment.be  This test is no t yet approved or cleared by the [place] FDA and  has been authorized for detection and/or diagnosis of SARS-CoV-2 by FDA under an Emergency Use Authorization (EUA). This EUA will remain  in effect (meaning this test can be used) for the duration of the COVID-19 declaration under Section 564(b)(1) of the  Act, 21 U.S.C.section 360bbb-3(b)(1), unless the authorization is terminated  or revoked sooner.       Influenza A by PCR NEGATIVE NEGATIVE Final   Influenza B by PCR NEGATIVE NEGATIVE Final    Comment: (NOTE) The Xpert Xpress SARS-CoV-2/FLU/RSV plus assay is intended as an aid in the diagnosis of influenza from Nasopharyngeal swab specimens and should not be used as a sole basis for treatment. Nasal washings and aspirates are unacceptable for Xpert Xpress SARS-CoV-2/FLU/RSV testing.  Fact Sheet for Patients: EntrepreneurPulse.com.au  Fact Sheet for Healthcare Providers: IncredibleEmployment.be  This test is not yet approved or cleared by the [place] FDA and has been authorized for detection and/or diagnosis of SARS-CoV-2 by FDA under an Emergency Use Authorization (EUA). This EUA will remain in effect (meaning this test can be used) for the duration of the COVID-19 declaration under Section 564(b)(1) of the Act, 21 U.S.C. section 360bbb-3(b)(1), unless the authorization is terminated or revoked.  Performed at [facility], [place] [address] [place], [place] [address]   SARS Coronavirus 2 by RT PCR (hospital order, performed in [facility] hospital lab) Nasopharyngeal  Nasopharyngeal Swab     Status: None   Collection Time: 12/26/20  9:17 AM   Specimen: Nasopharyngeal Swab  Result Value Ref Range Status   SARS Coronavirus 2 NEGATIVE NEGATIVE Final    Comment: (NOTE) SARS-CoV-2 target nucleic acids are NOT DETECTED.  The SARS-CoV-2 RNA is generally detectable in upper and lower respiratory specimens during the acute phase of infection. The lowest concentration of SARS-CoV-2 viral copies this assay can detect is 250 copies / mL. A negative result does not preclude SARS-CoV-2 infection and should not be used as the sole basis for treatment or other patient management decisions.  A negative result may occur with improper specimen  collection / handling, submission of specimen other than nasopharyngeal swab, presence of viral mutation(s) within the areas targeted by this assay, and inadequate number of viral copies (<250 copies / mL). A negative result must be combined with clinical observations, patient history, and epidemiological information.  Fact Sheet for Patients:   StrictlyIdeas.no  Fact Sheet for Healthcare Providers: BankingDealers.co.za  This test is not yet approved or  cleared by the [place] FDA and has been authorized for detection and/or diagnosis of SARS-CoV-2 by FDA under an Emergency Use Authorization (EUA).  This EUA will remain in effect (meaning this test can be used) for the duration of the COVID-19 declaration under Section 564(b)(1) of the Act, 21 U.S.C. section 360bbb-3(b)(1), unless the authorization is terminated or revoked sooner.  Performed at [facility], [place] [address] [place],  [address]      Time coordinating discharge: 40 minutes  SIGNED:   Elmarie Shiley, MD  [facility]

## 2020-12-27 NOTE — Plan of Care (Signed)

## 2020-12-27 NOTE — Progress Notes (Signed)
SLP Cancellation Note  Patient Details Name: MUJTABA BOLLIG MRN: [id number] DOB: 06/01/1946   Cancelled treatment:       Reason Eval/Treat Not Completed: Other (comment). Pt has continued diet  Though intake has been poor. Pt awaiting d/c to SNF with hospice care. All education has been completed last week. Will sign off at this time and defer f/u to next level of care as appropriate.    Abigale Dorow, Katherene Ponto 12/27/2020, 7:57 AM

## 2020-12-28 DIAGNOSIS — I10 Essential (primary) hypertension: Secondary | ICD-10-CM | POA: Diagnosis not present

## 2020-12-28 DIAGNOSIS — R569 Unspecified convulsions: Secondary | ICD-10-CM | POA: Diagnosis not present

## 2020-12-28 DIAGNOSIS — R64 Cachexia: Secondary | ICD-10-CM | POA: Diagnosis not present

## 2020-12-28 NOTE — Progress Notes (Signed)
Physical Therapy Treatment Patient Details Name: Brad [name] MRN: [id number] DOB: 17-Oct-1946 Today's Date: 12/28/2020    History of Present Illness Pt is 75 yo male presenting with AMS. CTH/CTA negative for acute findings; MRI pending. Per MD, there is concern for breakthrough seizure due to missed medications. 12/19 EEG 2 focal motor seizures (RUE rhythmic twitching) suggestive of PRES. PMH includes: prostate ca, anemia, arthritis, dementia, depression, DM, HTN, seizures, substance abuse.  Noted that initial plan was for hospice at d/c but family has changed their minds and want aggressive care at [facility].  Pt unable to provide PLOF but appeared to be living at home.  Did note in MD note from 12/16/20 that pt has been bedbound since 2018.    PT Comments    Patient progressing slowly towards PT goals. Pt alert and able to follow commands today. Reports pain in right knee at beginning of session but able to ease some of this with relaxation/stretching techniques which in turn improved sitting balance. Pt initially requiring max A with posterior lean improving to Mod A for a few moments before fatiguing. Able to get to midline orientation sitting EOB today with UE support and external support and perform some there ex. Will continue to follow.   Follow Up Recommendations  SNF (vs LTC)     Equipment Recommendations  Hospital bed;Other (comment) (lift)    Recommendations for Other Services       Precautions / Restrictions Precautions Precautions: Fall;Other (comment) Required Braces or Orthoses: Other Brace Other Brace: R palm guard; Restrictions Weight Bearing Restrictions: No    Mobility  Bed Mobility Overal bed mobility: Needs Assistance Bed Mobility: Supine to Sit;Sit to Supine     Supine to sit: +2 for safety/equipment;+2 for physical assistance;Max assist;HOB elevated Sit to supine: +2 for safety/equipment;+2 for physical assistance;Max assist   General bed mobility  comments: MaxAx2 for bed mob, cuing pt to reach UEs across body to Rt side of bed and attempt to bring legs off EOB to transition to sit. Pt able to initiate arm movement and pull with Lft hand on therapist's hand but required maxAx2 to initiate and complete trunk ascension and manage legs.  Transfers                 General transfer comment: Unsafe/unable  Ambulation/Gait                 Stairs             Wheelchair Mobility    Modified Rankin (Stroke Patients Only)       Balance Overall balance assessment: Needs assistance Sitting-balance support: Feet unsupported;Single extremity supported Sitting balance-Leahy Scale: Poor Sitting balance - Comments: Pt initially leaning posteriorly with maxA to sit EOB but then when performing relaxation techniques to right knee able to decrease amount of assist needed to maintani balance. Requires UE support. Able to get to midline orientation today. Fatigued after 5 minutes. Postural control: Posterior lean                                  Cognition Arousal/Alertness: Awake/alert Behavior During Therapy: Flat affect Overall Cognitive Status: No family/caregiver present to determine baseline cognitive functioning                                 General Comments: Follows a few simple commands  today. Pt conversates some throughout. Unaware of deficits impacting his safety. KNows he is in the hospital; initially thinks it is December but able to state january with cues as well as the year with options. Distracted by pain.      Exercises General Exercises - Lower Extremity Long Arc Quad: AROM;Left;5 reps;Seated Other Exercises Other Exercises: Performed relaxation techniques to right knee to decrease tension/pain- improved allowing pt to require less assist for sitting balance.    General Comments        Pertinent Vitals/Pain Pain Assessment: Faces Faces Pain Scale: Hurts whole lot Pain  Location: Rt knee Pain Descriptors / Indicators: Guarding;Grimacing;Moaning Pain Intervention(s): Monitored during session;Repositioned;Limited activity within patient's tolerance    Home Living                      Prior Function            PT Goals (current goals can now be found in the care plan section) Progress towards PT goals: Progressing toward goals (slowly)    Frequency    Min 2X/week      PT Plan Current plan remains appropriate    Co-evaluation              AM-PAC PT "6 Clicks" Mobility   Outcome Measure  Help needed turning from your back to your side while in a flat bed without using bedrails?: A Lot Help needed moving from lying on your back to sitting on the side of a flat bed without using bedrails?: A Lot Help needed moving to and from a bed to a chair (including a wheelchair)?: Total Help needed standing up from a chair using your arms (e.g., wheelchair or bedside chair)?: Total Help needed to walk in hospital room?: Total Help needed climbing 3-5 steps with a railing? : Total 6 Click Score: 8    End of Session   Activity Tolerance: Patient limited by fatigue;Patient limited by pain Patient left: in bed;with call bell/phone within reach;with bed alarm set;with SCD's reapplied Nurse Communication: Mobility status;Need for lift equipment PT Visit Diagnosis: Other symptoms and signs involving the nervous system (I95.188)     Time: 4166-0630 PT Time Calculation (min) (ACUTE ONLY): 13 min  Charges:  $Therapeutic Activity: 8-22 mins                     Marisa Severin, PT, DPT Acute Rehabilitation Services Pager [phone] Office [place] 12/28/2020, 10:18 AM

## 2020-12-28 NOTE — Progress Notes (Signed)
Progress Note    Brad [name]  [id number] DOB: 1946-06-11  DOA: 12/04/2020 PCP: [name], DO    Brief Narrative:   Chief complaint: [place] records reviewed and are as summarized below:  Brad [name] is an 75 y.o. male with a PMH of prostate cancer, depression, type 2 diabetes mellitus, hypertension, hyperlipidemia, seizure disorder, substance abuse as well as other comorbidities who presented to the emergency room with altered mental status. Upon arrival to the ED he was noted to be hypertensive with a systolic blood pressure [id number] but the rest of his vital signs were stable. WBC was 9.9 and blood glucose was 75. Head CT showed no acute intracranial abnormality. CTA of the head neck showed normal CT perfusion study and an unchanged 60% stenosis of the proximal left internal carotid artery secondary to mixed density atherosclerosis. He was seen by neurology and after speaking with family, the patient's presentation was felt to be secondary to seizure activity at home. Given IV valproic acid. Patient has been bedbound since 2018.   During his hospital course he has had poor p.o. intake and palliative care was consulted. Patient's daughter made her father DNR and it was noted that the patient's living will documents that he does not want artificial means of feeding. Initially after palliative care discussions family was inclined towards comfort care and hospice placement but then changed her mind and wanted aggressive care. Patient was being worked up for SNF placement. Hospitalization further complicated by UTI klebsiella as well as downward trend in hgb without overt blood loss.   Notified by TOC that patient's insurance denied SNF placement. Family appealed and won, so SNF placement now pending.  Patient scheduled to go once bed available.  No events overnight.  Patient resting comfortably.  Assessment/Plan:   Principle Problem: Breakthrough  seizure -Likely secondary to medication noncompliance. -Continue Depakote, no seizure events noted.  -Appreciate neurology consult.  Active Problems: Cachexia/Poor oral intake/Severe protein calorie malnutrition/underweight/hypoalbuminemia -Agree with assessment by dietician below: -Albumin persistently low  -Signs/Symptoms: meal completion < 50%. Patient with muscle/fat store loss.  -Interventions: MVI,Boost Breeze,Prostat,Refer to RD note for recommendations  -Body mass index is 16.96 kg/m. -Patient's living will states that patient does not wish for artificial nutrition. -Appreciate SLP recommendations. Continue dysphagia I diet. -Currently on dysphagia diet but intake remains poor.  Klebsiella UTI -Completed antibiotic therapy.  Essential hypertension -Continue amlodipine and PRN Hydralazine.  Hyperlipidemia -Continue Lipitor.  Normocytic anemia, anemia of chronic disease -Baseline hemoglobin has ranged between 7-9 and had slow downward trend to 6.9.  No overt blood loss.  Status post 1 unit packed red blood cells on 12/21/20.  FOBT pending.  Hemoglobin stable at this time, continue to monitor  Advanced dementia -Appears more calm today..  Noted that patient has been bedbound since 2018.  Leukocytosis -Likely reactive. He is afebrile without specific complaints.  Normalized on 1/8  Multiple pressure wounds as documented below In agreement with assessment of the pressure ulcer as below:  Pressure Injury 12/05/20 Coccyx Medial Stage 2 -  Partial thickness loss of dermis presenting as a shallow open injury with a red, pink wound bed without slough. 54mm diameter open circular abrasion, top layer skin not intact, red and blanchable, superior (Active)  12/05/20 2145  Location: Coccyx  Location Orientation: Medial  Staging: Stage 2 -  Partial thickness loss of dermis presenting as a shallow open injury with a red, pink wound bed without slough.  Wound Description  (  Comments): 51mm diameter open circular abrasion, top layer skin not intact, red and blanchable, superior to medial coccyx  Present on Admission: Yes     Pressure Injury 12/22/20 Ankle Anterior;Right Deep Tissue Pressure Injury - Purple or maroon localized area of discolored intact skin or blood-filled blister due to damage of underlying soft tissue from pressure and/or shear. purple ecchymosis 1.8x1.5cm  (Active)  12/22/20 1720  Location: Ankle  Location Orientation: Anterior;Right  Staging: Deep Tissue Pressure Injury - Purple or maroon localized area of discolored intact skin or blood-filled blister due to damage of underlying soft tissue from pressure and/or shear.  Wound Description (Comments): purple ecchymosis 1.8x1.5cm near maleolus  Present on Admission: No     Pressure Injury 12/22/20 Heel Left Deep Tissue Pressure Injury - Purple or maroon localized area of discolored intact skin or blood-filled blister due to damage of underlying soft tissue from pressure and/or shear. 9x6cm dark pink/ red area across heel (Active)  12/22/20 1720  Location: Heel  Location Orientation: Left  Staging: Deep Tissue Pressure Injury - Purple or maroon localized area of discolored intact skin or blood-filled blister due to damage of underlying soft tissue from pressure and/or shear.  Wound Description (Comments): 9x6cm dark pink/ red area across heel  Present on Admission: No     Pressure Injury 12/22/20 Heel Right Deep Tissue Pressure Injury - Purple or maroon localized area of discolored intact skin or blood-filled blister due to damage of underlying soft tissue from pressure and/or shear. 4x7cm dark purple area across heel (Active)  12/22/20 1720  Location: Heel  Location Orientation: Right  Staging: Deep Tissue Pressure Injury - Purple or maroon localized area of discolored intact skin or blood-filled blister due to damage of underlying soft tissue from pressure and/or shear.  Wound Description  (Comments): 4x7cm dark purple area across heel  Present on Admission: No     Family Communication/Anticipated D/C date and plan/Code Status   DVT prophylaxis: Place and maintain sequential compression device Start: 12/22/20 1315   Code Status: DNR.  Family Communication: Left message for sister Disposition Plan: Status is: Inpatient  Remains inpatient appropriate because:awaiting SNF placement, unsafe to d/c home. Family chose [facility], hoping to have bed this week   Dispo: The patient is from: Home              Anticipated d/c is to: SNF              Anticipated d/c date is: 1 day              Patient currently is medically stable to d/c once a SNF bed is available.  Medical Consultants:    Palliative Care  Neurology   Anti-Infectives:    None  Subjective:    Objective:    Vitals:   12/28/20 0008 12/28/20 0303 12/28/20 0745 12/28/20 1127  BP: 132/68 128/71 (!) 119/57 124/63  Pulse: 87 96 86 76  Resp: 19 18 20 18   Temp: 99.4 F (37.4 C) 99.6 F (37.6 C) (!) 97.5 F (36.4 C) 98.4 F (36.9 C)  TempSrc: Axillary Axillary Oral Oral  SpO2: 99% 95% 100% 100%  Weight:      Height:        Intake/Output Summary (Last 24 hours) at 12/28/2020 1337 Last data filed at 12/28/2020 0635 Gross per 24 hour  Intake -  Output 250 ml  Net -250 ml   Filed Weights   12/04/20 2310 12/04/20 2312  Weight: 58.3 kg 58.3 kg  Exam:  General: No acute distress. Thin and frail with loss of muscle mass and subcutaneous fat stores, unchanged. Cardiovascular: Heart sounds show a regular rate, and rhythm.  Lungs: Clear to auscultation bilaterally with decreased breath sounds.  Abdomen: Soft, nontender, nondistended with positive bowel sounds Extremities: No clubbing or cyanosis. No edema.  Psychiatric: Mood and affect are flat.  Pressure Injury 12/05/20 Coccyx Medial Stage 2 -  Partial thickness loss of dermis presenting as a shallow open injury with a red, pink wound bed  without slough. 57mm diameter open circular abrasion, top layer skin not intact, red and blanchable, superior (Active)  12/05/20 2145  Location: Coccyx  Location Orientation: Medial  Staging: Stage 2 -  Partial thickness loss of dermis presenting as a shallow open injury with a red, pink wound bed without slough.  Wound Description (Comments): 57mm diameter open circular abrasion, top layer skin not intact, red and blanchable, superior to medial coccyx  Present on Admission: Yes     Pressure Injury 12/22/20 Ankle Anterior;Right Deep Tissue Pressure Injury - Purple or maroon localized area of discolored intact skin or blood-filled blister due to damage of underlying soft tissue from pressure and/or shear. purple ecchymosis 1.8x1.5cm  (Active)  12/22/20 1720  Location: Ankle  Location Orientation: Anterior;Right  Staging: Deep Tissue Pressure Injury - Purple or maroon localized area of discolored intact skin or blood-filled blister due to damage of underlying soft tissue from pressure and/or shear.  Wound Description (Comments): purple ecchymosis 1.8x1.5cm near maleolus  Present on Admission: No     Pressure Injury 12/22/20 Heel Left Deep Tissue Pressure Injury - Purple or maroon localized area of discolored intact skin or blood-filled blister due to damage of underlying soft tissue from pressure and/or shear. 9x6cm dark pink/ red area across heel (Active)  12/22/20 1720  Location: Heel  Location Orientation: Left  Staging: Deep Tissue Pressure Injury - Purple or maroon localized area of discolored intact skin or blood-filled blister due to damage of underlying soft tissue from pressure and/or shear.  Wound Description (Comments): 9x6cm dark pink/ red area across heel  Present on Admission: No     Pressure Injury 12/22/20 Heel Right Deep Tissue Pressure Injury - Purple or maroon localized area of discolored intact skin or blood-filled blister due to damage of underlying soft tissue from pressure  and/or shear. 4x7cm dark purple area across heel (Active)  12/22/20 1720  Location: Heel  Location Orientation: Right  Staging: Deep Tissue Pressure Injury - Purple or maroon localized area of discolored intact skin or blood-filled blister due to damage of underlying soft tissue from pressure and/or shear.  Wound Description (Comments): 4x7cm dark purple area across heel  Present on Admission: No     Pressure Injury 12/26/20 Foot Anterior;Left (Active)  12/26/20 1953  Location: Foot  Location Orientation: Anterior;Left  Staging:   Wound Description (Comments):   Present on Admission: No        Data Reviewed:   I have personally reviewed following labs and imaging studies:  Labs: Labs show the following:   Basic Metabolic Panel: Recent Labs  Lab 12/22/20 0323 12/23/20 0254 12/25/20 0404  NA 134* 133* 136  K 4.1 4.0 4.2  CL 96* 94* 97*  CO2 26 26 27   GLUCOSE 87 105* 88  BUN 21 24* 23  CREATININE 0.68 0.65 0.66  CALCIUM 7.9* 8.4* 8.4*  MG 1.8  --   --   PHOS 2.9  --   --    GFR Estimated  Creatinine Clearance: 67.8 mL/min (by C-G formula based on SCr of 0.66 mg/dL). Liver Function Tests: Recent Labs  Lab 12/22/20 0323  AST 46*  ALT 32  ALKPHOS 53  BILITOT 0.6  PROT 5.5*  ALBUMIN 1.8*   CBC: Recent Labs  Lab 12/21/20 2052 12/22/20 0323 12/23/20 0254 12/24/20 0139 12/25/20 0404  WBC  --  9.9 11.4* 12.3* 10.7*  NEUTROABS  --  6.9  --   --   --   HGB 8.5* 8.4* 9.2* 9.9* 9.1*  HCT 25.5* 25.6* 29.2* 30.2* 28.3*  MCV  --  91.4 93.6 92.6 93.4  PLT  --  330 394 454* 430*   CBG: No results for input(s): GLUCAP in the last 168 hours.  Microbiology Recent Results (from the past 240 hour(s))  Resp Panel by RT-PCR (Flu A&B, Covid) Nasopharyngeal Swab     Status: None   Collection Time: 12/19/20  1:10 PM   Specimen: Nasopharyngeal Swab; Nasopharyngeal(NP) swabs in vial transport medium  Result Value Ref Range Status   SARS Coronavirus 2 by RT PCR  NEGATIVE NEGATIVE Final    Comment: (NOTE) SARS-CoV-2 target nucleic acids are NOT DETECTED.  The SARS-CoV-2 RNA is generally detectable in upper respiratory specimens during the acute phase of infection. The lowest concentration of SARS-CoV-2 viral copies this assay can detect is 138 copies/mL. A negative result does not preclude SARS-Cov-2 infection and should not be used as the sole basis for treatment or other patient management decisions. A negative result may occur with  improper specimen collection/handling, submission of specimen other than nasopharyngeal swab, presence of viral mutation(s) within the areas targeted by this assay, and inadequate number of viral copies(<138 copies/mL). A negative result must be combined with clinical observations, patient history, and epidemiological information. The expected result is Negative.  Fact Sheet for Patients:  EntrepreneurPulse.com.au  Fact Sheet for Healthcare Providers:  IncredibleEmployment.be  This test is no t yet approved or cleared by the [place] FDA and  has been authorized for detection and/or diagnosis of SARS-CoV-2 by FDA under an Emergency Use Authorization (EUA). This EUA will remain  in effect (meaning this test can be used) for the duration of the COVID-19 declaration under Section 564(b)(1) of the Act, 21 U.S.C.section 360bbb-3(b)(1), unless the authorization is terminated  or revoked sooner.       Influenza A by PCR NEGATIVE NEGATIVE Final   Influenza B by PCR NEGATIVE NEGATIVE Final    Comment: (NOTE) The Xpert Xpress SARS-CoV-2/FLU/RSV plus assay is intended as an aid in the diagnosis of influenza from Nasopharyngeal swab specimens and should not be used as a sole basis for treatment. Nasal washings and aspirates are unacceptable for Xpert Xpress SARS-CoV-2/FLU/RSV testing.  Fact Sheet for Patients: EntrepreneurPulse.com.au  Fact Sheet for  Healthcare Providers: IncredibleEmployment.be  This test is not yet approved or cleared by the [place] FDA and has been authorized for detection and/or diagnosis of SARS-CoV-2 by FDA under an Emergency Use Authorization (EUA). This EUA will remain in effect (meaning this test can be used) for the duration of the COVID-19 declaration under Section 564(b)(1) of the Act, 21 U.S.C. section 360bbb-3(b)(1), unless the authorization is terminated or revoked.  Performed at [facility], [place] [address] [place], [place] [address]   SARS Coronavirus 2 by RT PCR (hospital order, performed in [facility] hospital lab) Nasopharyngeal Nasopharyngeal Swab     Status: None   Collection Time: 12/26/20  9:17 AM   Specimen: Nasopharyngeal Swab  Result Value Ref Range Status   SARS Coronavirus 2 NEGATIVE NEGATIVE Final    Comment: (NOTE) SARS-CoV-2 target nucleic acids are NOT DETECTED.  The SARS-CoV-2 RNA is generally detectable in upper and lower respiratory specimens during the acute phase of infection. The lowest concentration of SARS-CoV-2 viral copies this assay can detect is 250 copies / mL. A negative result does not preclude SARS-CoV-2 infection and should not be used as the sole basis for treatment or other patient management decisions.  A negative result may occur with improper specimen collection / handling, submission of specimen other than nasopharyngeal swab, presence of viral mutation(s) within the areas targeted by this assay, and inadequate number of viral copies (<250 copies / mL). A negative result must be combined with clinical observations, patient history, and epidemiological information.  Fact Sheet for Patients:   StrictlyIdeas.no  Fact Sheet for Healthcare Providers: BankingDealers.co.za  This test is not yet approved or  cleared by the [place] FDA and has been authorized for detection  and/or diagnosis of SARS-CoV-2 by FDA under an Emergency Use Authorization (EUA).  This EUA will remain in effect (meaning this test can be used) for the duration of the COVID-19 declaration under Section 564(b)(1) of the Act, 21 U.S.C. section 360bbb-3(b)(1), unless the authorization is terminated or revoked sooner.  Performed at [facility], [place] [address], [place], [place] [address]     Procedures and diagnostic studies:  No results found.  Medications:   . (feeding supplement) PROSource Plus  30 mL Oral BID BM  . amLODipine  2.5 mg Oral Daily  . atorvastatin  20 mg Oral QHS  . divalproex  500 mg Oral Q12H  . feeding supplement  237 mL Oral QID  . FLUoxetine  40 mg Oral Daily  . lubriderm seriously sensitive   Topical Daily  . mirtazapine  30 mg Oral QHS  . multivitamin with minerals  1 tablet Oral Daily  . nystatin  5 mL Oral QID  . tamsulosin  0.4 mg Oral QHS   Continuous Infusions:   LOS: 23 days   Annita Brod, MD  [facility]   [facility] How to contact the [facility] Attending or Consulting provider [place] or covering provider during after hours [place], for this patient?  1. Check the care team in [facility] and look for a) attending/consulting TRH provider listed and b) the [facility] team listed 2. Log into [email or web address] and use [place]'s universal password to access. If you do not have the password, please contact the hospital operator. 3. Locate the [facility] provider you are looking for under [facility] and page to a number that you can be directly reached. 4. If you still have difficulty reaching the provider, please page the [facility] (Director on Call) for the Hospitalists listed on amion for assistance.  12/28/2020, 1:37 PM

## 2020-12-28 NOTE — TOC Progression Note (Signed)
Transition of Care [facility]) - Progression Note    Patient Details  Name: Brad [name] MRN: [id number] Date of Birth: [birth year]  Transition of Care [facility] [place]) CM/SW [place], [place] Phone Number: 12/28/2020, 2:28 PM  Clinical Narrative:   CSW contacted [facility] to ask about bed availability, and they still have no bed available for the patient today. CSW to follow.    Expected Discharge Plan: [place] Barriers to Discharge: Continued Medical Work up,No SNF bed  Expected Discharge Plan and Services Expected Discharge Plan: [place] In-house Referral: Clinical Social Work Discharge Planning Services: CM Consult   Living arrangements for the past 2 months: Single Family Home Expected Discharge Date: 12/27/20                                     Social Determinants of Health (SDOH) Interventions    Readmission Risk Interventions No flowsheet data found.

## 2020-12-29 DIAGNOSIS — R569 Unspecified convulsions: Secondary | ICD-10-CM | POA: Diagnosis not present

## 2020-12-29 NOTE — Progress Notes (Signed)
[facility] PROGRESS NOTE    Brad [name]  ERX:[id number] DOB: 1946/11/14 DOA: 12/04/2020 PCP: [name], DO      Brief Narrative:  Brad [name] is a 75 y.o. M with DM, HTN, seizures, substance use disorder, bedbound since 2018 and pros CA who presented with confusion.    In the ER, BP elevated, CT head and CTA H&N without obvious cause.  Neurology were consulted and after description of his symptoms at home, the conclusion was drawn that the patient had had a seizure.         Assessment & Plan:  Epilepsy -Continue Depakote  Dementia Failure to thrive Severe protein calorie malnutrition Cachexia -Continue mirtazapine -Continue feeding supplements -Continue Proazac  Hypertension Hyperlipidemia - Continue amlodipine and atorvastatin  Stage II coccyx pressure injury, POA Right ankle deep tissue pressure injury, new Left heel deep tissue pressure injury, new  Right heel deep tissue pressure injury, new -WOC consult  BPH Prostate CA -Continue Flomax  Anemia of chronic disaease       Disposition: Status is: Inpatient  Remains inpatient appropriate because:Unsafe d/c plan   Dispo: The patient is from: Home              Anticipated d/c is to: SNF              Anticipated d/c date is: 1 day              Patient currently is medically stable to d/c.              MDM: The below labs and imaging reports were reviewed and summarized above.  Medication management as above.    DVT prophylaxis: Place and maintain sequential compression device Start: 12/22/20 1315  Code Status: DNR Family Communication:           Subjective: Patient has some right knee discomfort, no fever, vomiting, diarrhea.  No respiratory distress, chest pain  Objective: Vitals:   12/28/20 2338 12/29/20 0342 12/29/20 0858 12/29/20 1253  BP: (!) 129/55 133/78 132/70 137/72  Pulse: 71 82 74 71  Resp: 18 14 18 16   Temp: 98 F (36.7 C) 98.4  F (36.9 C) 97.7 F (36.5 C) 97.8 F (36.6 C)  TempSrc: Axillary Oral Oral Axillary  SpO2: 100% 100% 98% 100%  Weight:      Height:        Intake/Output Summary (Last 24 hours) at 12/29/2020 1559 Last data filed at 12/28/2020 1700 Gross per 24 hour  Intake -  Output 300 ml  Net -300 ml   Filed Weights   12/04/20 2310 12/04/20 2312  Weight: 58.3 kg 58.3 kg    Examination: General appearance: Cachectic elderly adult male, alert and in no obvious distress.  Lying in bed HEENT: Anicteric, conjunctiva pink, lids and lashes normal. No nasal deformity, discharge, epistaxis.   Skin: Warm and dry.  no jaundice.  No suspicious rashes or lesions.  Numerous foam dressings on pressure injuries on knees, ankles, buttocks. Cardiac: RRR, nl S1-S2, no murmurs appreciated.  No lower extremity edema or arm edema Respiratory: Normal respiratory rate and rhythm.  CTAB without rales or wheezes. Abdomen: Abdomen soft.  No TTP or guarding. No ascites, distension, hepatosplenomegaly.   MSK: Severe diffuse loss of subcutaneous muscle mass Neuro: Awake and alert.  Contractures in all 4 extremities, severely weak in the bilateral upper extremities.  Speech fluent.    Psych: Oriented to self and place, thinks it is nighttime, not oriented  to month or year.    Data Reviewed: I have personally reviewed following labs and imaging studies:  CBC: Recent Labs  Lab 12/23/20 0254 12/24/20 0139 12/25/20 0404  WBC 11.4* 12.3* 10.7*  HGB 9.2* 9.9* 9.1*  HCT 29.2* 30.2* 28.3*  MCV 93.6 92.6 93.4  PLT 394 454* 409*   Basic Metabolic Panel: Recent Labs  Lab 12/23/20 0254 12/25/20 0404  NA 133* 136  K 4.0 4.2  CL 94* 97*  CO2 26 27  GLUCOSE 105* 88  BUN 24* 23  CREATININE 0.65 0.66  CALCIUM 8.4* 8.4*   GFR: Estimated Creatinine Clearance: 67.8 mL/min (by C-G formula based on SCr of 0.66 mg/dL). Liver Function Tests: No results for input(s): AST, ALT, ALKPHOS, BILITOT, PROT, ALBUMIN in the last  168 hours. No results for input(s): LIPASE, AMYLASE in the last 168 hours. No results for input(s): AMMONIA in the last 168 hours. Coagulation Profile: No results for input(s): INR, PROTIME in the last 168 hours. Cardiac Enzymes: No results for input(s): CKTOTAL, CKMB, CKMBINDEX, TROPONINI in the last 168 hours. BNP (last 3 results) No results for input(s): PROBNP in the last 8760 hours. HbA1C: No results for input(s): HGBA1C in the last 72 hours. CBG: No results for input(s): GLUCAP in the last 168 hours. Lipid Profile: No results for input(s): CHOL, HDL, LDLCALC, TRIG, CHOLHDL, LDLDIRECT in the last 72 hours. Thyroid Function Tests: No results for input(s): TSH, T4TOTAL, FREET4, T3FREE, THYROIDAB in the last 72 hours. Anemia Panel: No results for input(s): VITAMINB12, FOLATE, FERRITIN, TIBC, IRON, RETICCTPCT in the last 72 hours. Urine analysis:    Component Value Date/Time   COLORURINE YELLOW 12/17/2020 0311   APPEARANCEUR HAZY (A) 12/17/2020 0311   LABSPEC 1.012 12/17/2020 0311   PHURINE 8.0 12/17/2020 0311   GLUCOSEU NEGATIVE 12/17/2020 0311   HGBUR SMALL (A) 12/17/2020 0311   HGBUR negative 05/29/2008 1402   BILIRUBINUR NEGATIVE 12/17/2020 0311   KETONESUR NEGATIVE 12/17/2020 0311   PROTEINUR NEGATIVE 12/17/2020 0311   UROBILINOGEN 2.0 (H) 09/17/2020 1417   NITRITE NEGATIVE 12/17/2020 0311   LEUKOCYTESUR MODERATE (A) 12/17/2020 0311   Sepsis Labs: @LABRCNTIP (procalcitonin:4,lacticacidven:4)  ) Recent Results (from the past 240 hour(s))  SARS Coronavirus 2 by RT PCR (hospital order, performed in [place] hospital lab) Nasopharyngeal Nasopharyngeal Swab     Status: None   Collection Time: 12/26/20  9:17 AM   Specimen: Nasopharyngeal Swab  Result Value Ref Range Status   SARS Coronavirus 2 NEGATIVE NEGATIVE Final    Comment: (NOTE) SARS-CoV-2 target nucleic acids are NOT DETECTED.  The SARS-CoV-2 RNA is generally detectable in upper and lower respiratory  specimens during the acute phase of infection. The lowest concentration of SARS-CoV-2 viral copies this assay can detect is 250 copies / mL. A negative result does not preclude SARS-CoV-2 infection and should not be used as the sole basis for treatment or other patient management decisions.  A negative result may occur with improper specimen collection / handling, submission of specimen other than nasopharyngeal swab, presence of viral mutation(s) within the areas targeted by this assay, and inadequate number of viral copies (<250 copies / mL). A negative result must be combined with clinical observations, patient history, and epidemiological information.  Fact Sheet for Patients:   StrictlyIdeas.no  Fact Sheet for Healthcare Providers: BankingDealers.co.za  This test is not yet approved or  cleared by the [place] FDA and has been authorized for detection and/or diagnosis of SARS-CoV-2 by FDA under an Emergency Use Authorization (  EUA).  This EUA will remain in effect (meaning this test can be used) for the duration of the COVID-19 declaration under Section 564(b)(1) of the Act, 21 U.S.C. section 360bbb-3(b)(1), unless the authorization is terminated or revoked sooner.  Performed at [facility], [place] [address] [place], [place] [address]          Radiology Studies: No results found.      Scheduled Meds: . (feeding supplement) PROSource Plus  30 mL Oral BID BM  . amLODipine  2.5 mg Oral Daily  . atorvastatin  20 mg Oral QHS  . divalproex  500 mg Oral Q12H  . feeding supplement  237 mL Oral QID  . FLUoxetine  40 mg Oral Daily  . lubriderm seriously sensitive   Topical Daily  . mirtazapine  30 mg Oral QHS  . multivitamin with minerals  1 tablet Oral Daily  . nystatin  5 mL Oral QID  . tamsulosin  0.4 mg Oral QHS   Continuous Infusions:   LOS: 24 days    Time spent: 25 minutes    Edwin Dada, MD [facility] 12/29/2020, 3:59 PM     Please page though [place] or Epic secure chat:  For [employer], Adult nurse

## 2020-12-29 NOTE — TOC Progression Note (Signed)
Transition of Care [facility]) - Progression Note    Patient Details  Name: Brad [name] MRN: [id number] Date of Birth: 1945/12/26  Transition of Care [facility]) CM/SW [place], [place] Phone Number: 12/29/2020, 1:30 PM  Clinical Narrative:   CSW reached out to [facility] to ask about bed availability, and there is still no bed available today. They are thinking that they won't have a bed available until Monday, but they will update CSW with any changes. CSW to follow.    Expected Discharge Plan: [place] Barriers to Discharge: Continued Medical Work up,No SNF bed  Expected Discharge Plan and Services Expected Discharge Plan: [place] In-house Referral: Clinical Social Work Discharge Planning Services: CM Consult   Living arrangements for the past 2 months: Single Family Home Expected Discharge Date: 12/27/20                                     Social Determinants of Health (SDOH) Interventions    Readmission Risk Interventions No flowsheet data found.

## 2020-12-29 NOTE — Plan of Care (Signed)
  Problem: Health Behavior/Discharge Planning: Goal: Ability to manage health-related needs will improve Outcome: Progressing   

## 2020-12-30 DIAGNOSIS — E43 Unspecified severe protein-calorie malnutrition: Secondary | ICD-10-CM | POA: Insufficient documentation

## 2020-12-30 DIAGNOSIS — R569 Unspecified convulsions: Secondary | ICD-10-CM | POA: Diagnosis not present

## 2020-12-30 NOTE — Progress Notes (Signed)
[facility] PROGRESS NOTE    Brad [name]  [id number] DOB: 12-May-1946 DOA: 12/04/2020 PCP: [name], DO      Brief Narrative:  Brad [name] is a 75 y.o. M with DM, HTN, seizures, substance use disorder, bedbound since 2018 and pros CA who presented with confusion.    In the ER, BP elevated, CT head and CTA H&N without obvious cause.  Neurology were consulted and after description of his symptoms at home, the conclusion was drawn that the patient had had a seizure.     Hospital course: No further seizure activity.  Patient was noted to have extremely poor p.o. intake, palliative care was consulted, patient was made DNR.  Further hospitalization complicated by UTI, anemia.  Now stable, attempting rehab here and awaiting SNF rehabilitation placement.         Assessment & Plan:  Epilepsy No seizure activity -Continue Depakote  Dementia Failure to thrive Severe protein calorie malnutrition Cachexia -Continue mirtazapine, feeding supplements, fluoxetine -Palliative care to follow at facility  Hypertension Hyperlipidemia - Continue amlodipine and atorvastatin  Stage II coccyx pressure injury, POA Right ankle deep tissue pressure injury, new Left heel deep tissue pressure injury, new  Right heel deep tissue pressure injury, new -WOC consult  BPH Prostate CA -Continue Flomax  Anemia of chronic disaease       Disposition: Status is: Inpatient  Remains inpatient appropriate because:Unsafe d/c plan   Dispo: The patient is from: Home              Anticipated d/c is to: SNF              Anticipated d/c date is: 1 day              Patient currently is medically stable to d/c.              MDM: The below labs and imaging reports were reviewed and summarized above.  Medication management as above.    DVT prophylaxis: Place and maintain sequential compression device Start: 12/22/20 1315  Code Status: DNR Family  Communication:           Subjective: No pain complaints, respiratory distress, vomiting, fever, diarrhea.  Patient denies complaints.    Objective: Vitals:   12/30/20 0025 12/30/20 0400 12/30/20 0829 12/30/20 1123  BP: 131/72 137/69 (!) 126/58 124/67  Pulse: 86 84 91 81  Resp: 18 20 18 18   Temp: 99.1 F (37.3 C) 98.9 F (37.2 C) 97.9 F (36.6 C) 99.3 F (37.4 C)  TempSrc: Oral Oral Oral Oral  SpO2: 100% 100% 99% 99%  Weight:      Height:        Intake/Output Summary (Last 24 hours) at 12/30/2020 1345 Last data filed at 12/30/2020 0900 Gross per 24 hour  Intake 320 ml  Output 600 ml  Net -280 ml   Filed Weights   12/04/20 2310 12/04/20 2312  Weight: 58.3 kg 58.3 kg    Examination: General appearance: Cachectic elderly male, lying in bed.  Sleeping, arouses sluggishly.     HEENT:    Skin:  Cardiac: RRR no murmurs, no lower extremity edema Respiratory: Normal respiratory rate and rhythm, lungs clear without rales or wheezes Abdomen: No grimace to palpation, no ascites or distention MSK:  Neuro: Contractures in all 4 extremities, speech fluent Psych: Oriented to self only, attention diminished, sleepy     Data Reviewed: I have personally reviewed following labs and imaging studies:  CBC: Recent  Labs  Lab 12/24/20 0139 12/25/20 0404  WBC 12.3* 10.7*  HGB 9.9* 9.1*  HCT 30.2* 28.3*  MCV 92.6 93.4  PLT 454* 539*   Basic Metabolic Panel: Recent Labs  Lab 12/25/20 0404  NA 136  K 4.2  CL 97*  CO2 27  GLUCOSE 88  BUN 23  CREATININE 0.66  CALCIUM 8.4*   GFR: Estimated Creatinine Clearance: 67.8 mL/min (by C-G formula based on SCr of 0.66 mg/dL). Liver Function Tests: No results for input(s): AST, ALT, ALKPHOS, BILITOT, PROT, ALBUMIN in the last 168 hours. No results for input(s): LIPASE, AMYLASE in the last 168 hours. No results for input(s): AMMONIA in the last 168 hours. Coagulation Profile: No results for input(s): INR, PROTIME in the  last 168 hours. Cardiac Enzymes: No results for input(s): CKTOTAL, CKMB, CKMBINDEX, TROPONINI in the last 168 hours. BNP (last 3 results) No results for input(s): PROBNP in the last 8760 hours. HbA1C: No results for input(s): HGBA1C in the last 72 hours. CBG: No results for input(s): GLUCAP in the last 168 hours. Lipid Profile: No results for input(s): CHOL, HDL, LDLCALC, TRIG, CHOLHDL, LDLDIRECT in the last 72 hours. Thyroid Function Tests: No results for input(s): TSH, T4TOTAL, FREET4, T3FREE, THYROIDAB in the last 72 hours. Anemia Panel: No results for input(s): VITAMINB12, FOLATE, FERRITIN, TIBC, IRON, RETICCTPCT in the last 72 hours. Urine analysis:    Component Value Date/Time   COLORURINE YELLOW 12/17/2020 0311   APPEARANCEUR HAZY (A) 12/17/2020 0311   LABSPEC 1.012 12/17/2020 0311   PHURINE 8.0 12/17/2020 0311   GLUCOSEU NEGATIVE 12/17/2020 0311   HGBUR SMALL (A) 12/17/2020 0311   HGBUR negative 05/29/2008 1402   BILIRUBINUR NEGATIVE 12/17/2020 0311   KETONESUR NEGATIVE 12/17/2020 0311   PROTEINUR NEGATIVE 12/17/2020 0311   UROBILINOGEN 2.0 (H) 09/17/2020 1417   NITRITE NEGATIVE 12/17/2020 0311   LEUKOCYTESUR MODERATE (A) 12/17/2020 0311   Sepsis Labs: @LABRCNTIP (procalcitonin:4,lacticacidven:4)  ) Recent Results (from the past 240 hour(s))  SARS Coronavirus 2 by RT PCR (hospital order, performed in [place] hospital lab) Nasopharyngeal Nasopharyngeal Swab     Status: None   Collection Time: 12/26/20  9:17 AM   Specimen: Nasopharyngeal Swab  Result Value Ref Range Status   SARS Coronavirus 2 NEGATIVE NEGATIVE Final    Comment: (NOTE) SARS-CoV-2 target nucleic acids are NOT DETECTED.  The SARS-CoV-2 RNA is generally detectable in upper and lower respiratory specimens during the acute phase of infection. The lowest concentration of SARS-CoV-2 viral copies this assay can detect is 250 copies / mL. A negative result does not preclude SARS-CoV-2 infection and  should not be used as the sole basis for treatment or other patient management decisions.  A negative result may occur with improper specimen collection / handling, submission of specimen other than nasopharyngeal swab, presence of viral mutation(s) within the areas targeted by this assay, and inadequate number of viral copies (<250 copies / mL). A negative result must be combined with clinical observations, patient history, and epidemiological information.  Fact Sheet for Patients:   StrictlyIdeas.no  Fact Sheet for Healthcare Providers: BankingDealers.co.za  This test is not yet approved or  cleared by the [place] FDA and has been authorized for detection and/or diagnosis of SARS-CoV-2 by FDA under an Emergency Use Authorization (EUA).  This EUA will remain in effect (meaning this test can be used) for the duration of the COVID-19 declaration under Section 564(b)(1) of the Act, 21 U.S.C. section 360bbb-3(b)(1), unless the authorization is terminated or revoked  sooner.  Performed at [facility], [place] [address], [place], [place] [address]          Radiology Studies: No results found.      Scheduled Meds: . (feeding supplement) PROSource Plus  30 mL Oral BID BM  . amLODipine  2.5 mg Oral Daily  . atorvastatin  20 mg Oral QHS  . divalproex  500 mg Oral Q12H  . feeding supplement  237 mL Oral QID  . FLUoxetine  40 mg Oral Daily  . lubriderm seriously sensitive   Topical Daily  . mirtazapine  30 mg Oral QHS  . multivitamin with minerals  1 tablet Oral Daily  . nystatin  5 mL Oral QID  . tamsulosin  0.4 mg Oral QHS   Continuous Infusions:   LOS: 25 days    Time spent: 25 minutes    Edwin Dada, MD [facility] 12/30/2020, 1:45 PM     Please page though [place] or Epic secure chat:  For [employer], Adult nurse

## 2020-12-30 NOTE — Progress Notes (Signed)
Nutrition Follow-up  DOCUMENTATION CODES:   Severe malnutrition in context of chronic illness  INTERVENTION:   Recommend initiation of bowel regimen given no BM documented since 12/25/20.   Continue Magic cup TID with meals, each supplement provides 290 kcal and 9 grams of protein  Continue Ensure Enlive/Plus poQID, each supplement provides 350 kcal and 20 grams of protein (Ensure Plus provides only 13 grams protein)  Continue40ml Prosource Plus po BID, each supplement provides 100 kcals and 15 grams of protein  Continue MVI daily  Continue to assist pt with meals   NUTRITION DIAGNOSIS:   Severe Malnutrition related to chronic illness (dementia) as evidenced by severe muscle depletion,severe fat depletion,energy intake < or equal to 75% for > or equal to 1 month.  updated  GOAL:   Patient will meet greater than or equal to 90% of their needs  progressing  MONITOR:   PO intake,Supplement acceptance,Diet advancement,Weight trends,Labs,I & O's  REASON FOR ASSESSMENT:   Consult Enteral/tube feeding initiation and management  ASSESSMENT:   Pt admitted with breakthrough seizures. PMH includes dementia, prostate cancer, depression, type 2 DM, HTN, HLD, seizure, substance abuse. Pt bedbound at baseline.  Pt still pending d/c to SNF. Per Case Management, bed likely unavailable until Monday.  Pt's intake remains poor. Meal completions charted as 10-50% x last 7 recorded meals (32% average meal intake). Per RN, pt has continued to do well with supplement consumption. Will continue current nutrition plan of care. Note pt's living will states he does not wish to have a feeding tube.  Note no BM documented since 12/25/20. Recommend initiation of bowel regimen.   UOP: 64ml x 24 hours   Labs reviewed. Medications: remeron, mvi with minerals  Diet Order:   Diet Order            Diet - low sodium heart healthy           DIET - DYS 1 Room service appropriate? Yes; Fluid  consistency: Thin  Diet effective now                 EDUCATION NEEDS:   Not appropriate for education at this time  Skin:  Skin Assessment: Skin Integrity Issues: Skin Integrity Issues:: DTI,Stage II,Other (Comment) DTI: L/R heel, R ankle Stage II: coccyx Other: pressure ulcer to L foot (stage not specified)  Last BM:  1/8 type 6  Height:   Ht Readings from Last 1 Encounters:  12/04/20 6\' 1"  (1.854 m)    Weight:   Wt Readings from Last 1 Encounters:  12/04/20 58.3 kg   BMI:  Body mass index is 16.96 kg/m.  Estimated Nutritional Needs:   Kcal:  1950-2150  Protein:  95-105 grams  Fluid:  >1.95L/d   Larkin Ina, MS, RD, LDN RD pager number and weekend/on-call pager number located in [place].

## 2020-12-30 NOTE — TOC Progression Note (Signed)
Transition of Care [facility] - Progression Note    Patient Details  Name: Brad [name] MRN: [id number] Date of Birth: September 08, 1946  Transition of Care [facility]) CM/SW [place], [place] Phone Number: 12/30/2020, 12:13 PM  Clinical Narrative:   CSW reached out to both [facility] and [facility] to ask about bed availability, and if [facility] could work on a transfer if they have a bed and [facility] does not. [facility] has no bed still today, and [name] is not sure if they do or not, they will have to get back to CSW when they know. No other bed offers at this time. CSW to follow.    Expected Discharge Plan: [place] Barriers to Discharge: Continued Medical Work up,No SNF bed  Expected Discharge Plan and Services Expected Discharge Plan: [place] In-house Referral: Clinical Social Work Discharge Planning Services: CM Consult   Living arrangements for the past 2 months: Single Family Home Expected Discharge Date: 12/27/20                                     Social Determinants of Health (SDOH) Interventions    Readmission Risk Interventions No flowsheet data found.

## 2020-12-30 NOTE — Care Management Important Message (Signed)
Important Message  Patient Details  Name: Brad [name] MRN: [id number] Date of Birth: [birth year]   Medicare Important Message Given:  Yes     [place] [address] 12/30/2020, 4:07 PM

## 2020-12-31 DIAGNOSIS — R64 Cachexia: Secondary | ICD-10-CM | POA: Diagnosis not present

## 2020-12-31 DIAGNOSIS — E785 Hyperlipidemia, unspecified: Secondary | ICD-10-CM | POA: Diagnosis not present

## 2020-12-31 DIAGNOSIS — E43 Unspecified severe protein-calorie malnutrition: Secondary | ICD-10-CM

## 2020-12-31 DIAGNOSIS — G40919 Epilepsy, unspecified, intractable, without status epilepticus: Secondary | ICD-10-CM | POA: Diagnosis not present

## 2020-12-31 NOTE — TOC Progression Note (Signed)
Transition of Care [facility]) - Progression Note    Patient Details  Name: Brad [name] MRN: [id number] Date of Birth: October 31, 1946  Transition of Care [facility]) CM/SW [place], RN Phone Number: 12/31/2020, 8:02 AM  Clinical Narrative:     Patient will transistion to residential hospice when bed available at [facility].  Expected Discharge Plan: [place] Barriers to Discharge: Continued Medical Work up,No SNF bed  Expected Discharge Plan and Services Expected Discharge Plan: [place] In-house Referral: Clinical Social Work Discharge Planning Services: CM Consult   Living arrangements for the past 2 months: Single Family Home Expected Discharge Date: 12/27/20                                     Social Determinants of Health (SDOH) Interventions    Readmission Risk Interventions No flowsheet data found.

## 2020-12-31 NOTE — Progress Notes (Signed)
Nurse attempted to call report to [place]. No answer, nurse left voice message.  Gwendolyn Grant, RN

## 2020-12-31 NOTE — Progress Notes (Signed)
Physical Therapy Treatment Patient Details Name: Brad [name] MRN: [id number] DOB: 1946/03/17 Today's Date: 12/31/2020    History of Present Illness Pt is 75 yo male presenting with AMS. CTH/CTA negative for acute findings; MRI pending. Per MD, there is concern for breakthrough seizure due to missed medications. 12/19 EEG 2 focal motor seizures (RUE rhythmic twitching) suggestive of PRES. PMH includes: prostate ca, anemia, arthritis, dementia, depression, DM, HTN, seizures, substance abuse.  Noted that initial plan was for hospice at d/c but family has changed their minds and want aggressive care at [facility].  Pt unable to provide PLOF but appeared to be living at home.  Did note in MD note from 12/16/20 that pt has been bedbound since 2018.    PT Comments    Pt continuing to slowly progress towards his goals, with his goals being appropriately upgraded to advance his independence with mobility. Pt able to perform all bed mob with maxAx1 and use of bed rails and controls this date, with extra time to process and initiate response to cues. Pt declined sitting in full upright position EOB, but rather remained sitting leaning laterally onto L elbow even when cued to reach with R hand laterally to R off BOS. However, he only required min guard to keep his balance in this position for ~5-6 min before pt fatigued. Will continue to follow acutely. Current recommendations remain appropriate.   Follow Up Recommendations  SNF (vs LTC)     Equipment Recommendations  Hospital bed;Other (comment) (lift)    Recommendations for Other Services       Precautions / Restrictions Precautions Precautions: Fall;Other (comment) Precaution Comments: Seizures Required Braces or Orthoses: Other Brace Other Brace: R palm guard; Restrictions Weight Bearing Restrictions: No    Mobility  Bed Mobility Overal bed mobility: Needs Assistance Bed Mobility: Supine to Sit;Sit to Supine;Rolling Rolling: Max assist    Supine to sit: Max assist;HOB elevated Sit to supine: Max assist   General bed mobility comments: MaxA for bed mob, cuing pt to reach UEs across body to L side of bed and attempt to bring legs off EOB to transition to sit. Pt able to initiate arm movement and pull with L hand on rail but required maxA to initiate and complete trunk ascension and manage legs. MaxA to return to supine and roll to R, cuing to manage legs and trunk with min initiation from pt.  Transfers                 General transfer comment: Unsafe/unable  Ambulation/Gait                 Stairs             Wheelchair Mobility    Modified Rankin (Stroke Patients Only)       Balance Overall balance assessment: Needs assistance Sitting-balance support: Feet unsupported;Single extremity supported Sitting balance-Leahy Scale: Poor Sitting balance - Comments: Pt sitting EOB leaning onto L elbow despite max cues and attemtps to correct pt to midline with L hand on bed. Quickly progressed from requiring modA to only needing min guard for balance sitting in this position for ~5-6 min. Cued pt to reach dynamically with R hadn off BOS laterally to R to encourage upright posture, min success and reach 3x. Postural control: Left lateral lean   Standing balance-Leahy Scale: Zero Standing balance comment: unable/contractures  Cognition Arousal/Alertness: Awake/alert Behavior During Therapy: Flat affect Overall Cognitive Status: No family/caregiver present to determine baseline cognitive functioning                                 General Comments: Follows a few simple commands today. Pt conversates some throughout. Unaware of deficits impacting his safety. Knows he is in the hospital, but did not state his year of birth, perseverating on month and date of his birth instead.      Exercises Other Exercises Other Exercises: AAROM R knee extension  stretch 3x ~30 sec prior to sitting up EOB    General Comments General comments (skin integrity, edema, etc.): Pt positioned with pillow under L lateral trunk to attempt positioning rolled towards R and with pillow folded behind R knee and roll between bil knees for contracture and skin breakdown prevention end of session      Pertinent Vitals/Pain Pain Assessment: Faces Faces Pain Scale: Hurts whole lot Pain Location: Rt knee Pain Descriptors / Indicators: Guarding;Grimacing;Moaning Pain Intervention(s): Limited activity within patient's tolerance;Monitored during session;Repositioned    Home Living                      Prior Function            PT Goals (current goals can now be found in the care plan section) Acute Rehab PT Goals Patient Stated Goal: to rest PT Goal Formulation: With patient Time For Goal Achievement: 01/14/21 Potential to Achieve Goals: Poor Progress towards PT goals: Progressing toward goals (slowly)    Frequency    Min 2X/week      PT Plan Current plan remains appropriate    Co-evaluation              AM-PAC PT "6 Clicks" Mobility   Outcome Measure  Help needed turning from your back to your side while in a flat bed without using bedrails?: A Lot Help needed moving from lying on your back to sitting on the side of a flat bed without using bedrails?: A Lot Help needed moving to and from a bed to a chair (including a wheelchair)?: Total Help needed standing up from a chair using your arms (e.g., wheelchair or bedside chair)?: Total Help needed to walk in hospital room?: Total Help needed climbing 3-5 steps with a railing? : Total 6 Click Score: 8    End of Session   Activity Tolerance: Patient limited by fatigue;Patient limited by pain Patient left: in bed;with call bell/phone within reach;with bed alarm set   PT Visit Diagnosis: Other symptoms and signs involving the nervous system (T51.761)     Time: 6073-7106 PT Time  Calculation (min) (ACUTE ONLY): 19 min  Charges:  $Neuromuscular Re-education: 8-22 mins                     Moishe Spice, PT, DPT Acute Rehabilitation Services  Pager: [phone] Office: [place] 12/31/2020, 12:56 PM

## 2020-12-31 NOTE — Discharge Summary (Signed)
Physician Discharge Summary  Brad [name] [id number] DOB: 1946-06-03 DOA: 12/04/2020  PCP: [name], DO  Admit date: 12/04/2020 Discharge date: 12/31/2020  Admitted From: Home Disposition: Skilled nursing facility  Recommendations for Outpatient Follow-up:  1. Follow up with PCP in 1-2 weeks 2. Please obtain BMP/CBC in one week 3. Follow-up with neurology outpatient for further care of the seizure  Discharge condition: Stable CODE STATUS: DNR Diet recommendation: Dysphagia 1 diet  Brief/Interim Summary: 75 year old with past medical history significant for prostate cancer, depression, diabetes type 2, hypertension, hyperlipidemia, seizure disorder, substance abuse as well as other comorbidities who presented to the emergency room with altered mental status.  Upon arrival to the ED he was noted to be hypertensive with a systolic blood pressure of 190s but the rest of his vital signs were stable.  White blood cell was 9.9 and blood glucose was 75.  CT head showed no acute intracranial abnormality.  CTA of the head neck showed normal CT perfusion study and unchanged 60% stenosis of the proximal left internal carotid artery secondary to mixed density atherosclerosis.  He was seen by neurology and after speaking with family the patient's presentation was felt to be secondary to seizure activity at home.  Given IV valproic acid.  Patient has been bedbound since 2018.  During his hospital course he has poor oral intake and palliative care was consulted.  Patient's daughter made her father DNR and it  was noted that patient's living will documents that he does not want artificial means of feeding.  Initially after palliative care discussed with  family was inclined toward comfort care and hospice placement but then she  changed her mind and wanted aggressive care.  Patient was being worked up for SNF placement.  Hospitalization further complicated by UTI Klebsiella as well as downward trend  in hemoglobin without overt blood loss.  Notified  by [facility] that patient's insurance denied SNF.  Family appealed and won, patient being discharged to SNF.   Following is a hospital course in the problem list format:  1-Breakthrough Seizure: Likely secondary to medication noncompliance Continue with Depakote, no seizure events noted. Appreciate neurology consult  2-Cachexia/poor oral intake/severe protein caloric malnutrition/underweight/hypoalbuminemia; Nutrition Status: Nutrition Problem: Inadequate oral intake Etiology: poor appetite Signs/Symptoms: meal completion < 50% Interventions: MVI,Boost Breeze,Prostat,Refer to RD note for recommendations  3-Klebsiella UTI: Completed antibiotic therapy, afebrile at this time.  4-Essential hypertension: Continue with amlodipine 5-Hyperlipidemia: Continue Lipitor 6-Normocytic anemia, anemia of chronic disease: Baseline hemoglobin has ranged between 7 and 9 and had slowed down over the trend to 6.9. No overt GI bleed. S/P 1 unit of packed red blood cells Hemoglobin stable today at 9.1.  Continue to monitor at facility. 7-advanced dementia: Bedbound since 2018 8-Leukocytosis: Reactive.  Afebrile.  WBC count improving.  Multiple pressure wounds as documented below In agreement with assessment of the pressure ulcer as below: Pressure Injury 12/05/20 Coccyx Medial Stage 2 - Partial thickness loss of dermis presenting as a shallow open injury with a red, pink wound bed without slough. 31mm diameter open circular abrasion, top layer skin not intact, red and blanchable, superior (Active)  12/05/20 2145  Location: Coccyx  Location Orientation: Medial  Staging: Stage 2 - Partial thickness loss of dermis presenting as a shallow open injury with a red, pink wound bed without slough.  Wound Description (Comments): 74mm diameter open circular abrasion, top layer skin not intact, red and blanchable, superior to medial coccyx  Present on  Admission: Yes  Pressure Injury 12/22/20 Ankle Anterior;Right Deep Tissue Pressure Injury - Purple or maroon localized area of discolored intact skin or blood-filled blister due to damage of underlying soft tissue from pressure and/or shear. purple ecchymosis 1.8x1.5cm (Active)  12/22/20 1720  Location: Ankle  Location Orientation: Anterior;Right  Staging: Deep Tissue Pressure Injury - Purple or maroon localized area of discolored intact skin or blood-filled blister due to damage of underlying soft tissue from pressure and/or shear.  Wound Description (Comments): purple ecchymosis 1.8x1.5cm near maleolus  Present on Admission: No    Pressure Injury 12/22/20 Heel Left Deep Tissue Pressure Injury - Purple or maroon localized area of discolored intact skin or blood-filled blister due to damage of underlying soft tissue from pressure and/or shear. 9x6cm dark pink/ red area across heel (Active)  12/22/20 1720  Location: Heel  Location Orientation: Left  Staging: Deep Tissue Pressure Injury - Purple or maroon localized area of discolored intact skin or blood-filled blister due to damage of underlying soft tissue from pressure and/or shear.  Wound Description (Comments): 9x6cm dark pink/ red area across heel  Present on Admission: No    Pressure Injury 12/22/20 Heel Right Deep Tissue Pressure Injury - Purple or maroon localized area of discolored intact skin or blood-filled blister due to damage of underlying soft tissue from pressure and/or shear. 4x7cm dark purple area across heel (Active)  12/22/20 1720  Location: Heel  Location Orientation: Right  Staging: Deep Tissue Pressure Injury - Purple or maroon localized area of discolored intact skin or blood-filled blister due to damage of underlying soft tissue from pressure and/or shear.  Wound Description (Comments): 4x7cm dark purple area across heel  Present on Admission: No   Discharge Diagnoses:  Principal Problem:   Seizure  ([place]) Active Problems:   Hypertension   Hyperlipidemia   Breakthrough seizure ([place])   Pressure injury of skin   Cachexia ([place])   Protein-calorie malnutrition, severe    Discharge Instructions  Discharge Instructions    Diet - low sodium heart healthy   Complete by: As directed    Discharge wound care:   Complete by: As directed    See above   Increase activity slowly   Complete by: As directed      Allergies as of 12/31/2020   No Known Allergies     Medication List    STOP taking these medications   divalproex 500 MG 24 hr tablet Commonly known as: DEPAKOTE ER Replaced by: divalproex 125 MG capsule   meloxicam 15 MG tablet Commonly known as: MOBIC     TAKE these medications   acetaminophen 500 MG tablet Commonly known as: TYLENOL Take 1,000 mg by mouth every 6 (six) hours as needed for moderate pain.   amLODipine 2.5 MG tablet Commonly known as: NORVASC Take 1 tablet (2.5 mg total) by mouth daily.   atorvastatin 20 MG tablet Commonly known as: LIPITOR Take 1 tablet (20 mg total) by mouth daily. What changed: when to take this   divalproex 125 MG capsule Commonly known as: DEPAKOTE SPRINKLE Take 4 capsules (500 mg total) by mouth every 12 (twelve) hours. Replaces: divalproex 500 MG 24 hr tablet   FLUoxetine 20 MG capsule Commonly known as: PROZAC Take 2 capsules (40 mg total) by mouth daily. What changed: Another medication with the same name was removed. Continue taking this medication, and follow the directions you see here.   mirtazapine 30 MG tablet Commonly known as: REMERON Take 30 mg by mouth at bedtime.  multivitamin with minerals Tabs tablet Take 1 tablet by mouth daily.   nystatin 100000 UNIT/ML suspension Commonly known as: MYCOSTATIN Take 5 mLs (500,000 Units total) by mouth 4 (four) times daily.   tamsulosin 0.4 MG Caps capsule Commonly known as: FLOMAX Take 0.4 mg by mouth at bedtime.   temazepam 7.5 MG capsule Commonly known  as: RESTORIL Take 1 capsule (7.5 mg total) by mouth at bedtime as needed for sleep.            Durable Medical Equipment  (From admission, onward)         Start     Ordered   12/20/20 1226  For home use only DME Hospital bed  Once       Question Answer Comment  Length of Need 6 Months   The above medical condition requires: Patient requires the ability to reposition frequently   Bed type Semi-electric   Hoyer Lift Yes   Support Surface: Low Air loss Mattress      12/20/20 1225           Discharge Care Instructions  (From admission, onward)         Start     Ordered   12/27/20 0000  Discharge wound care:       Comments: See above   12/27/20 [id number]          Contact information for follow-up providers    [name], DO Follow up in 1 week(s).   Specialty: General Practice Contact information: [place] [address] [phone]            Contact information for after-discharge care    Destination    [facility] .   Service: Skilled Nursing Contact information: [place] [phone]                 No Known Allergies  Consultations: Neurology, palliative care   Procedures/Studies: EEG  Result Date: 12/11/2020 Lora Havens, MD     12/11/2020  1:24 PM Patient Name: Brad [name] MRN: [id number] Epilepsy Attending: Lora Havens Referring Physician/Provider: Dr Early Osmond Date: 12/11/2020 Duration: 23.53 mins Patient history: 75yo M with dementia and seizures. EEG to evaluate for seizure Level of alertness: Awake AEDs during EEG study: VPA Technical aspects: This EEG study was done with scalp electrodes positioned according to the 10-20 International system of electrode placement. Electrical activity was acquired at a sampling rate of 500Hz  and reviewed with a high frequency filter of 70Hz  and a low frequency filter of 1Hz . EEG data were recorded  continuously and digitally stored. Description: The posterior dominant rhythm consists of 8 Hz activity of moderate voltage (25-35 uV) seen predominantly in posterior head regions, symmetric and reactive to eye opening and eye closing. EEG showed intermittent generalized 3 to 6 Hz theta-delta slowing.Spike was seen in left temporoparietal region.   Hyperventilation and photic stimulation were not performed.   ABNORMALITY -Intermittent slow, generalized -Spike, left temporoparietal region. IMPRESSION: This study showed evidence of epileptogenicity arising left temporoparietal region as well as mild diffuse encephalopathy, nonspecific etiology. No seizures were seen throughout the recording. Lora Havens   EEG  Result Date: 12/05/2020 Lora Havens, MD     12/05/2020  3:40 PM Patient Name: Brad [name] MRN: [id number] Epilepsy Attending: Lora Havens Referring Physician/Provider: Dr Derrick Ravel Date: 12/05/2020 Duration: 25.58 mins Patient history: 74 y.o.malewith a PMHx of prostate cancer, anemia, arthritis, dementia,  depression, DM, HTN, seizures on Depakote, and substance abuse who presents with acute onset of AMS and right side weakness. EEG to evaluate for seizure. Level of alertness: Awake AEDs during EEG study: VPA Technical aspects: This EEG study was done with scalp electrodes positioned according to the 10-20 International system of electrode placement. Electrical activity was acquired at a sampling rate of 500Hz  and reviewed with a high frequency filter of 70Hz  and a low frequency filter of 1Hz . EEG data were recorded continuously and digitally stored. Description: No posterior dominant rhythm was seen. EEG showed continuous generalized and maximal bilateral posterior quadrant 3 to 6 Hz theta-delta slowing. Spikes were also noted in left>right parieto-occipital region, at times periodic at 1.5-2hz  as well as rhythmic. Two seizures were noted at 1120 and 1131 during which patient  was noted to have right arm rhythmic jerking, lasted about 22 and 24 seconds respectively. Concomitant eeg showed 2-3Hz  delta slowing in left>right parieto-occipital region which evolved into 5-6Hz  theta slowing. Hyperventilation did not show any EEG change.  Physiology photic driving was not seen during photic stimulation.  Hyperventilation and photic stimulation were not performed.   ABNORMALITY - Focal Motor seizure, left>right parieto-occipital region -Continuous slow, generalized and maximal bilateral posterior quadrant -Spike,left>right parieto-occipital region IMPRESSION: This study showed two focal motor seizure during which patient was noted to have right arm rhythmic twitching, arising from left>right parieto-occipital region at 1120 and 1131, lasted about 22 and 24 seconds respectively. Additionally, there was cortical dysfunction in bilateral posterior quadrant suggestive of underlying structural abnormality, PRES. There is also moderate diffuse encephalopathy, non specific to etiology. Dr Theda Sers was notified. Lora Havens   CT CEREBRAL PERFUSION W CONTRAST  Result Date: 12/04/2020 CLINICAL DATA:  Right-sided weakness EXAM: CT ANGIOGRAPHY HEAD AND NECK CT PERFUSION BRAIN TECHNIQUE: Multidetector CT imaging of the head and neck was performed using the standard protocol during bolus administration of intravenous contrast. Multiplanar CT image reconstructions and MIPs were obtained to evaluate the vascular anatomy. Carotid stenosis measurements (when applicable) are obtained utilizing NASCET criteria, using the distal internal carotid diameter as the denominator. Multiphase CT imaging of the brain was performed following IV bolus contrast injection. Subsequent parametric perfusion maps were calculated using RAPID software. CONTRAST:  119mL OMNIPAQUE IOHEXOL 350 MG/ML SOLN COMPARISON:  11/24/2017 CTA head neck FINDINGS: CTA NECK FINDINGS SKELETON: There is no bony spinal canal stenosis. No lytic or  blastic lesion. OTHER NECK: Normal pharynx, larynx and major salivary glands. No cervical lymphadenopathy. Unremarkable thyroid gland. UPPER CHEST: No pneumothorax or pleural effusion. No nodules or masses. AORTIC ARCH: There is calcific atherosclerosis of the aortic arch. There is no aneurysm, dissection or hemodynamically significant stenosis of the visualized portion of the aorta. Conventional 3 vessel aortic branching pattern. The visualized proximal subclavian arteries are widely patent. RIGHT CAROTID SYSTEM: Normal without aneurysm, dissection or stenosis. LEFT CAROTID SYSTEM: No dissection, occlusion or aneurysm. There is mixed density atherosclerosis extending into the proximal ICA, resulting in unchanged 60% stenosis. VERTEBRAL ARTERIES: Codominant configuration. Both origins are clearly patent. There is no dissection, occlusion or flow-limiting stenosis to the skull base (V1-V3 segments). CTA HEAD FINDINGS POSTERIOR CIRCULATION: --Vertebral arteries: Normal V4 segments. --Inferior cerebellar arteries: Normal. --Basilar artery: Normal. --Superior cerebellar arteries: Normal. --Posterior cerebral arteries (PCA): Normal. ANTERIOR CIRCULATION: --Intracranial internal carotid arteries: Normal. --Anterior cerebral arteries (ACA): Normal. Both A1 segments are present. Patent anterior communicating artery (a-comm). --Middle cerebral arteries (MCA): Normal. VENOUS SINUSES: As permitted by contrast timing, patent. ANATOMIC VARIANTS: Fetal  origins of both posterior cerebral arteries. Review of the MIP images confirms the above findings. CT Brain Perfusion Findings: ASPECTS: 10 CBF (<30%) Volume: 37mL Perfusion (Tmax>6.0s) volume: 29mL Mismatch Volume: 65mL Infarction Location:None IMPRESSION: 1. No emergent large vessel occlusion or high-grade stenosis of the intracranial arteries. 2. Unchanged 60% stenosis of the proximal left internal carotid artery secondary to mixed density atherosclerosis. 3. Normal CT perfusion.  Aortic Atherosclerosis (ICD10-I70.0). Electronically Signed   By: Ulyses Jarred M.D.   On: 12/04/2020 23:50   DG CHEST PORT 1 VIEW  Result Date: 12/17/2020 CLINICAL DATA:  Code stroke.  Shortness of breath. EXAM: PORTABLE CHEST 1 VIEW COMPARISON:  05/29/2020 FINDINGS: Cardiac silhouette is normal in size. No mediastinal or hilar masses. No evidence of adenopathy. Small subtle area opacity in the right upper lobe just superior and lateral to the right hilum. Remainder of the lungs is clear. No convincing pleural effusion or pneumothorax. Skeletal structures are grossly intact. IMPRESSION: 1. Small focus of opacity in the central right upper lobe, most likely atelectasis. Consider a focus of pneumonia if there are consistent clinical findings. No other evidence of acute cardiopulmonary disease. Electronically Signed   By: Lajean Manes M.D.   On: 12/17/2020 08:58   CT HEAD CODE STROKE WO CONTRAST  Result Date: 12/04/2020 CLINICAL DATA:  Code stroke.  Right-sided weakness EXAM: CT HEAD WITHOUT CONTRAST TECHNIQUE: Contiguous axial images were obtained from the base of the skull through the vertex without intravenous contrast. COMPARISON:  Head CT 05/29/2020 FINDINGS: Brain: There is no mass, hemorrhage or extra-axial collection. There is generalized atrophy without lobar predilection. Hypodensity of the white matter is most commonly associated with chronic microvascular disease. Multiple old small vessel infarcts of the basal ganglia. Vascular: No abnormal hyperdensity of the major intracranial arteries or dural venous sinuses. No intracranial atherosclerosis. Skull: The visualized skull base, calvarium and extracranial soft tissues are normal. Sinuses/Orbits: No fluid levels or advanced mucosal thickening of the visualized paranasal sinuses. No mastoid or middle ear effusion. The orbits are normal. ASPECTS ([place] Stroke Program Early CT Score) - Ganglionic level infarction (caudate, lentiform nuclei,  internal capsule, insula, M1-M3 cortex): 7 - Supraganglionic infarction (M4-M6 cortex): 3 Total score (0-10 with 10 being normal): 10 Review of the MIP images confirms the above findings IMPRESSION: 1. No acute intracranial abnormality. 2. ASPECTS is 10. These results were communicated to Dr. Kerney Elbe at 11:23 pm on 12/04/2020 by text page via the [facility] messaging system. Electronically Signed   By: Ulyses Jarred M.D.   On: 12/04/2020 23:23   CT ANGIO HEAD CODE STROKE  Result Date: 12/04/2020 CLINICAL DATA:  Right-sided weakness EXAM: CT ANGIOGRAPHY HEAD AND NECK CT PERFUSION BRAIN TECHNIQUE: Multidetector CT imaging of the head and neck was performed using the standard protocol during bolus administration of intravenous contrast. Multiplanar CT image reconstructions and MIPs were obtained to evaluate the vascular anatomy. Carotid stenosis measurements (when applicable) are obtained utilizing NASCET criteria, using the distal internal carotid diameter as the denominator. Multiphase CT imaging of the brain was performed following IV bolus contrast injection. Subsequent parametric perfusion maps were calculated using RAPID software. CONTRAST:  163mL OMNIPAQUE IOHEXOL 350 MG/ML SOLN COMPARISON:  11/24/2017 CTA head neck FINDINGS: CTA NECK FINDINGS SKELETON: There is no bony spinal canal stenosis. No lytic or blastic lesion. OTHER NECK: Normal pharynx, larynx and major salivary glands. No cervical lymphadenopathy. Unremarkable thyroid gland. UPPER CHEST: No pneumothorax or pleural effusion. No nodules or masses. AORTIC ARCH: There is calcific  atherosclerosis of the aortic arch. There is no aneurysm, dissection or hemodynamically significant stenosis of the visualized portion of the aorta. Conventional 3 vessel aortic branching pattern. The visualized proximal subclavian arteries are widely patent. RIGHT CAROTID SYSTEM: Normal without aneurysm, dissection or stenosis. LEFT CAROTID SYSTEM: No dissection, occlusion  or aneurysm. There is mixed density atherosclerosis extending into the proximal ICA, resulting in unchanged 60% stenosis. VERTEBRAL ARTERIES: Codominant configuration. Both origins are clearly patent. There is no dissection, occlusion or flow-limiting stenosis to the skull base (V1-V3 segments). CTA HEAD FINDINGS POSTERIOR CIRCULATION: --Vertebral arteries: Normal V4 segments. --Inferior cerebellar arteries: Normal. --Basilar artery: Normal. --Superior cerebellar arteries: Normal. --Posterior cerebral arteries (PCA): Normal. ANTERIOR CIRCULATION: --Intracranial internal carotid arteries: Normal. --Anterior cerebral arteries (ACA): Normal. Both A1 segments are present. Patent anterior communicating artery (a-comm). --Middle cerebral arteries (MCA): Normal. VENOUS SINUSES: As permitted by contrast timing, patent. ANATOMIC VARIANTS: Fetal origins of both posterior cerebral arteries. Review of the MIP images confirms the above findings. CT Brain Perfusion Findings: ASPECTS: 10 CBF (<30%) Volume: 17mL Perfusion (Tmax>6.0s) volume: 53mL Mismatch Volume: 31mL Infarction Location:None IMPRESSION: 1. No emergent large vessel occlusion or high-grade stenosis of the intracranial arteries. 2. Unchanged 60% stenosis of the proximal left internal carotid artery secondary to mixed density atherosclerosis. 3. Normal CT perfusion. Aortic Atherosclerosis (ICD10-I70.0). Electronically Signed   By: Ulyses Jarred M.D.   On: 12/04/2020 23:50   CT ANGIO NECK CODE STROKE  Result Date: 12/04/2020 CLINICAL DATA:  Right-sided weakness EXAM: CT ANGIOGRAPHY HEAD AND NECK CT PERFUSION BRAIN TECHNIQUE: Multidetector CT imaging of the head and neck was performed using the standard protocol during bolus administration of intravenous contrast. Multiplanar CT image reconstructions and MIPs were obtained to evaluate the vascular anatomy. Carotid stenosis measurements (when applicable) are obtained utilizing NASCET criteria, using the distal internal  carotid diameter as the denominator. Multiphase CT imaging of the brain was performed following IV bolus contrast injection. Subsequent parametric perfusion maps were calculated using RAPID software. CONTRAST:  167mL OMNIPAQUE IOHEXOL 350 MG/ML SOLN COMPARISON:  11/24/2017 CTA head neck FINDINGS: CTA NECK FINDINGS SKELETON: There is no bony spinal canal stenosis. No lytic or blastic lesion. OTHER NECK: Normal pharynx, larynx and major salivary glands. No cervical lymphadenopathy. Unremarkable thyroid gland. UPPER CHEST: No pneumothorax or pleural effusion. No nodules or masses. AORTIC ARCH: There is calcific atherosclerosis of the aortic arch. There is no aneurysm, dissection or hemodynamically significant stenosis of the visualized portion of the aorta. Conventional 3 vessel aortic branching pattern. The visualized proximal subclavian arteries are widely patent. RIGHT CAROTID SYSTEM: Normal without aneurysm, dissection or stenosis. LEFT CAROTID SYSTEM: No dissection, occlusion or aneurysm. There is mixed density atherosclerosis extending into the proximal ICA, resulting in unchanged 60% stenosis. VERTEBRAL ARTERIES: Codominant configuration. Both origins are clearly patent. There is no dissection, occlusion or flow-limiting stenosis to the skull base (V1-V3 segments). CTA HEAD FINDINGS POSTERIOR CIRCULATION: --Vertebral arteries: Normal V4 segments. --Inferior cerebellar arteries: Normal. --Basilar artery: Normal. --Superior cerebellar arteries: Normal. --Posterior cerebral arteries (PCA): Normal. ANTERIOR CIRCULATION: --Intracranial internal carotid arteries: Normal. --Anterior cerebral arteries (ACA): Normal. Both A1 segments are present. Patent anterior communicating artery (a-comm). --Middle cerebral arteries (MCA): Normal. VENOUS SINUSES: As permitted by contrast timing, patent. ANATOMIC VARIANTS: Fetal origins of both posterior cerebral arteries. Review of the MIP images confirms the above findings. CT Brain  Perfusion Findings: ASPECTS: 10 CBF (<30%) Volume: 68mL Perfusion (Tmax>6.0s) volume: 47mL Mismatch Volume: 58mL Infarction Location:None IMPRESSION: 1. No emergent large vessel occlusion or  high-grade stenosis of the intracranial arteries. 2. Unchanged 60% stenosis of the proximal left internal carotid artery secondary to mixed density atherosclerosis. 3. Normal CT perfusion. Aortic Atherosclerosis (ICD10-I70.0). Electronically Signed   By: Ulyses Jarred M.D.   On: 12/04/2020 23:50    (Echo, Carotid, EGD, Colonoscopy, ERCP)    Subjective:   Discharge Exam: Vitals:   12/31/20 0721 12/31/20 1117  BP: 122/77 129/78  Pulse: 80 81  Resp: 16 16  Temp: 97.6 F (36.4 C) (!) 97.5 F (36.4 C)  SpO2: 100% 100%   Vitals:   12/30/20 2350 12/31/20 0408 12/31/20 0721 12/31/20 1117  BP: (!) 114/55 117/63 122/77 129/78  Pulse: 81 80 80 81  Resp: 16 18 16 16   Temp: 98.5 F (36.9 C) 98 F (36.7 C) 97.6 F (36.4 C) (!) 97.5 F (36.4 C)  TempSrc: Oral Oral Oral Oral  SpO2: 100% 100% 100% 100%  Weight:      Height:        General: Pt awake, not in any acute distress Cardiovascular: S1, S2 Respiratory: Decreased breath sound lower lobes otherwise clear to auscultation Abdominal: Soft, NT, ND, bowel sounds + Extremities: no edema    The results of significant diagnostics from this hospitalization (including imaging, microbiology, ancillary and laboratory) are listed below for reference.     Microbiology: Recent Results (from the past 240 hour(s))  SARS Coronavirus 2 by RT PCR (hospital order, performed in [facility] hospital lab) Nasopharyngeal Nasopharyngeal Swab     Status: None   Collection Time: 12/26/20  9:17 AM   Specimen: Nasopharyngeal Swab  Result Value Ref Range Status   SARS Coronavirus 2 NEGATIVE NEGATIVE Final    Comment: (NOTE) SARS-CoV-2 target nucleic acids are NOT DETECTED.  The SARS-CoV-2 RNA is generally detectable in upper and lower respiratory specimens during  the acute phase of infection. The lowest concentration of SARS-CoV-2 viral copies this assay can detect is 250 copies / mL. A negative result does not preclude SARS-CoV-2 infection and should not be used as the sole basis for treatment or other patient management decisions.  A negative result may occur with improper specimen collection / handling, submission of specimen other than nasopharyngeal swab, presence of viral mutation(s) within the areas targeted by this assay, and inadequate number of viral copies (<250 copies / mL). A negative result must be combined with clinical observations, patient history, and epidemiological information.  Fact Sheet for Patients:   StrictlyIdeas.no  Fact Sheet for Healthcare Providers: BankingDealers.co.za  This test is not yet approved or  cleared by the [place] FDA and has been authorized for detection and/or diagnosis of SARS-CoV-2 by FDA under an Emergency Use Authorization (EUA).  This EUA will remain in effect (meaning this test can be used) for the duration of the COVID-19 declaration under Section 564(b)(1) of the Act, 21 U.S.C. section 360bbb-3(b)(1), unless the authorization is terminated or revoked sooner.  Performed at [facility], [place] [address] [place], [place] [address]      Labs: BNP (last 3 results) No results for input(s): BNP in the last 8760 hours. Basic Metabolic Panel: Recent Labs  Lab 12/25/20 0404  NA 136  K 4.2  CL 97*  CO2 27  GLUCOSE 88  BUN 23  CREATININE 0.66  CALCIUM 8.4*   Liver Function Tests: No results for input(s): AST, ALT, ALKPHOS, BILITOT, PROT, ALBUMIN in the last 168 hours. No results for input(s): LIPASE, AMYLASE in the last 168 hours. No results for  input(s): AMMONIA in the last 168 hours. CBC: Recent Labs  Lab 12/25/20 0404  WBC 10.7*  HGB 9.1*  HCT 28.3*  MCV 93.4  PLT 430*   Cardiac Enzymes: No results for input(s):  CKTOTAL, CKMB, CKMBINDEX, TROPONINI in the last 168 hours. BNP: Invalid input(s): POCBNP CBG: No results for input(s): GLUCAP in the last 168 hours. D-Dimer No results for input(s): DDIMER in the last 72 hours. Hgb A1c No results for input(s): HGBA1C in the last 72 hours. Lipid Profile No results for input(s): CHOL, HDL, LDLCALC, TRIG, CHOLHDL, LDLDIRECT in the last 72 hours. Thyroid function studies No results for input(s): TSH, T4TOTAL, T3FREE, THYROIDAB in the last 72 hours.  Invalid input(s): FREET3 Anemia work up No results for input(s): VITAMINB12, FOLATE, FERRITIN, TIBC, IRON, RETICCTPCT in the last 72 hours. Urinalysis    Component Value Date/Time   COLORURINE YELLOW 12/17/2020 0311   APPEARANCEUR HAZY (A) 12/17/2020 0311   LABSPEC 1.012 12/17/2020 0311   PHURINE 8.0 12/17/2020 0311   GLUCOSEU NEGATIVE 12/17/2020 0311   HGBUR SMALL (A) 12/17/2020 0311   HGBUR negative 05/29/2008 1402   BILIRUBINUR NEGATIVE 12/17/2020 0311   KETONESUR NEGATIVE 12/17/2020 0311   PROTEINUR NEGATIVE 12/17/2020 0311   UROBILINOGEN 2.0 (H) 09/17/2020 1417   NITRITE NEGATIVE 12/17/2020 0311   LEUKOCYTESUR MODERATE (A) 12/17/2020 0311   Sepsis Labs Invalid input(s): PROCALCITONIN,  WBC,  LACTICIDVEN Microbiology Recent Results (from the past 240 hour(s))  SARS Coronavirus 2 by RT PCR (hospital order, performed in [place] hospital lab) Nasopharyngeal Nasopharyngeal Swab     Status: None   Collection Time: 12/26/20  9:17 AM   Specimen: Nasopharyngeal Swab  Result Value Ref Range Status   SARS Coronavirus 2 NEGATIVE NEGATIVE Final    Comment: (NOTE) SARS-CoV-2 target nucleic acids are NOT DETECTED.  The SARS-CoV-2 RNA is generally detectable in upper and lower respiratory specimens during the acute phase of infection. The lowest concentration of SARS-CoV-2 viral copies this assay can detect is 250 copies / mL. A negative result does not preclude SARS-CoV-2 infection and should not  be used as the sole basis for treatment or other patient management decisions.  A negative result may occur with improper specimen collection / handling, submission of specimen other than nasopharyngeal swab, presence of viral mutation(s) within the areas targeted by this assay, and inadequate number of viral copies (<250 copies / mL). A negative result must be combined with clinical observations, patient history, and epidemiological information.  Fact Sheet for Patients:   StrictlyIdeas.no  Fact Sheet for Healthcare Providers: BankingDealers.co.za  This test is not yet approved or  cleared by the [place] FDA and has been authorized for detection and/or diagnosis of SARS-CoV-2 by FDA under an Emergency Use Authorization (EUA).  This EUA will remain in effect (meaning this test can be used) for the duration of the COVID-19 declaration under Section 564(b)(1) of the Act, 21 U.S.C. section 360bbb-3(b)(1), unless the authorization is terminated or revoked sooner.  Performed at [name], [place] [address] [place],  [address]      Time coordinating discharge: Over 30 minutes  SIGNED:   Yaakov Guthrie, MD  [facility] 12/31/2020, 2:19 PM Pager on amion  If 7PM-7AM, please contact night-coverage [email or web address] Password [email or web address]

## 2020-12-31 NOTE — Progress Notes (Signed)
Per patient RN, Hollie Salk, attempted to call report x3 to [facility] with no response.

## 2020-12-31 NOTE — H&P (Signed)
Nurse attempted to call report, no answer.  Gwendolyn Grant, RN

## 2020-12-31 NOTE — Progress Notes (Signed)
[facility] PROGRESS NOTE    Brad [name]  NGE:[id number] DOB: 24-Oct-1946 DOA: 12/04/2020 PCP: [name], DO      Brief Narrative:  Brad [name] is a 75 y.o. M with DM, HTN, seizures, substance use disorder, bedbound since 2018 and pros CA who presented with confusion.    In the ER, BP elevated, CT head and CTA H&N without obvious cause.  Neurology was consulted and after description of his symptoms at home, the conclusion was drawn that the patient had had a seizure.     Hospital course: No further seizure activity.  Patient was noted to have extremely poor p.o. intake, palliative care was consulted, patient was made DNR.  Further hospitalization complicated by UTI, anemia.  Now stable.  TOC following.  Per TOC patient will transition to residential hospital when bed available.  Assessment & Plan:  Epilepsy No seizure activity -Continue Depakote  Dementia Failure to thrive Severe protein calorie malnutrition Cachexia -Continue mirtazapine, feeding supplements, fluoxetine -Per TOC note patient to transition to residential hospital when bed available.  Hypertension Hyperlipidemia - Continue amlodipine and atorvastatin  Stage II coccyx pressure injury, POA Right ankle deep tissue pressure injury, new Left heel deep tissue pressure injury, new  Right heel deep tissue pressure injury, new -WOC consult  BPH Prostate CA -Continue Flomax  Anemia of chronic disaease    Disposition: Status is: Inpatient  Remains inpatient appropriate because:Unsafe d/c plan   Dispo: The patient is from: Home              Anticipated d/c is to: Likely residential hospice once bed available.              Anticipated d/c date is: 1 day              Patient currently is medically stable to d/c.    MDM: The below labs and imaging reports were reviewed and summarized above.  Medication management as above.    DVT prophylaxis: Place and maintain sequential  compression device Start: 12/22/20 1315  Code Status: DNR Family Communication: No family at bedside.  Unable to reach   Subjective: Patient denies complaints at this time.  Afebrile.   Objective: Vitals:   12/30/20 2029 12/30/20 2350 12/31/20 0408 12/31/20 0721  BP: 118/75 (!) 114/55 117/63 122/77  Pulse: 84 81 80 80  Resp: 17 16 18 16   Temp: 98.7 F (37.1 C) 98.5 F (36.9 C) 98 F (36.7 C) 97.6 F (36.4 C)  TempSrc: Oral Oral Oral Oral  SpO2: 100% 100% 100% 100%  Weight:      Height:        Intake/Output Summary (Last 24 hours) at 12/31/2020 0938 Last data filed at 12/31/2020 0846 Gross per 24 hour  Intake 240 ml  Output 650 ml  Net -410 ml   Filed Weights   12/04/20 2310 12/04/20 2312  Weight: 58.3 kg 58.3 kg    Examination: General appearance: Cachectic elderly male, lying in bed.      HEENT: Atraumatic, normocephalic, pupils equal and reactive, no urine ulcerations Skin: No new rashes Cardiac: S1, S2 Respiratory: Decreased breath sounds lower lobes, no rhonchi or wheezing Abdomen: No grimace to palpation, no ascites or distention MSK: Debility with generalized weakness Neuro: Contractures in all 4 extremities, speech fluent Psych: Oriented to self only, attention diminished   Data Reviewed: I have personally reviewed following labs and imaging studies:  CBC: Recent Labs  Lab 12/25/20 0404  WBC 10.7*  HGB 9.1*  HCT 28.3*  MCV 93.4  PLT 195*   Basic Metabolic Panel: Recent Labs  Lab 12/25/20 0404  NA 136  K 4.2  CL 97*  CO2 27  GLUCOSE 88  BUN 23  CREATININE 0.66  CALCIUM 8.4*   GFR: Estimated Creatinine Clearance: 67.8 mL/min (by C-G formula based on SCr of 0.66 mg/dL). Liver Function Tests: No results for input(s): AST, ALT, ALKPHOS, BILITOT, PROT, ALBUMIN in the last 168 hours. No results for input(s): LIPASE, AMYLASE in the last 168 hours. No results for input(s): AMMONIA in the last 168 hours. Coagulation Profile: No results for  input(s): INR, PROTIME in the last 168 hours. Cardiac Enzymes: No results for input(s): CKTOTAL, CKMB, CKMBINDEX, TROPONINI in the last 168 hours. BNP (last 3 results) No results for input(s): PROBNP in the last 8760 hours. HbA1C: No results for input(s): HGBA1C in the last 72 hours. CBG: No results for input(s): GLUCAP in the last 168 hours. Lipid Profile: No results for input(s): CHOL, HDL, LDLCALC, TRIG, CHOLHDL, LDLDIRECT in the last 72 hours. Thyroid Function Tests: No results for input(s): TSH, T4TOTAL, FREET4, T3FREE, THYROIDAB in the last 72 hours. Anemia Panel: No results for input(s): VITAMINB12, FOLATE, FERRITIN, TIBC, IRON, RETICCTPCT in the last 72 hours. Urine analysis:    Component Value Date/Time   COLORURINE YELLOW 12/17/2020 0311   APPEARANCEUR HAZY (A) 12/17/2020 0311   LABSPEC 1.012 12/17/2020 0311   PHURINE 8.0 12/17/2020 0311   GLUCOSEU NEGATIVE 12/17/2020 0311   HGBUR SMALL (A) 12/17/2020 0311   HGBUR negative 05/29/2008 1402   BILIRUBINUR NEGATIVE 12/17/2020 0311   KETONESUR NEGATIVE 12/17/2020 0311   PROTEINUR NEGATIVE 12/17/2020 0311   UROBILINOGEN 2.0 (H) 09/17/2020 1417   NITRITE NEGATIVE 12/17/2020 0311   LEUKOCYTESUR MODERATE (A) 12/17/2020 0311   Sepsis Labs: @LABRCNTIP (procalcitonin:4,lacticacidven:4)  ) Recent Results (from the past 240 hour(s))  SARS Coronavirus 2 by RT PCR (hospital order, performed in [place] hospital lab) Nasopharyngeal Nasopharyngeal Swab     Status: None   Collection Time: 12/26/20  9:17 AM   Specimen: Nasopharyngeal Swab  Result Value Ref Range Status   SARS Coronavirus 2 NEGATIVE NEGATIVE Final    Comment: (NOTE) SARS-CoV-2 target nucleic acids are NOT DETECTED.  The SARS-CoV-2 RNA is generally detectable in upper and lower respiratory specimens during the acute phase of infection. The lowest concentration of SARS-CoV-2 viral copies this assay can detect is 250 copies / mL. A negative result does not  preclude SARS-CoV-2 infection and should not be used as the sole basis for treatment or other patient management decisions.  A negative result may occur with improper specimen collection / handling, submission of specimen other than nasopharyngeal swab, presence of viral mutation(s) within the areas targeted by this assay, and inadequate number of viral copies (<250 copies / mL). A negative result must be combined with clinical observations, patient history, and epidemiological information.  Fact Sheet for Patients:   StrictlyIdeas.no  Fact Sheet for Healthcare Providers: BankingDealers.co.za  This test is not yet approved or  cleared by the [place] FDA and has been authorized for detection and/or diagnosis of SARS-CoV-2 by FDA under an Emergency Use Authorization (EUA).  This EUA will remain in effect (meaning this test can be used) for the duration of the COVID-19 declaration under Section 564(b)(1) of the Act, 21 U.S.C. section 360bbb-3(b)(1), unless the authorization is terminated or revoked sooner.  Performed at [facility], [place] [address] [place], [place] [address]  Radiology Studies: No results found.  Scheduled Meds: . (feeding supplement) PROSource Plus  30 mL Oral BID BM  . amLODipine  2.5 mg Oral Daily  . atorvastatin  20 mg Oral QHS  . divalproex  500 mg Oral Q12H  . feeding supplement  237 mL Oral QID  . FLUoxetine  40 mg Oral Daily  . lubriderm seriously sensitive   Topical Daily  . mirtazapine  30 mg Oral QHS  . multivitamin with minerals  1 tablet Oral Daily  . nystatin  5 mL Oral QID  . tamsulosin  0.4 mg Oral QHS   Continuous Infusions:   LOS: 26 days    Yaakov Guthrie, MD [facility] 12/31/2020, 9:38 AM     Please page though [place] or Epic secure chat:  For [employer], Adult nurse

## 2020-12-31 NOTE — TOC Transition Note (Signed)
Transition of Care [facility]) - CM/SW Discharge Note   Patient Details  Name: Brad [name] MRN: [id number] Date of Birth: 09-22-1946  [facility] CM/SW Contact:  Geralynn Ochs, LCSW Phone Number: 12/31/2020, 3:16 PM   Clinical Narrative:   Nurse to call report to [phone].    Final next level of care: Skilled Nursing Facility Barriers to Discharge: Barriers Resolved   Patient Goals and CMS Choice     Choice offered to / list presented to : Sibling  Discharge Placement              Patient chooses bed at:  ([facility]) Patient to be transferred to facility by: Non Emergency Ambulance Name of family member notified: [name] Patient and family notified of of transfer: 12/31/20  Discharge Plan and Services In-house Referral: Clinical Social Work Discharge Planning Services: CM Consult                                 Social Determinants of Health (SDOH) Interventions     Readmission Risk Interventions No flowsheet data found.

## 2020-12-31 NOTE — Progress Notes (Cosign Needed)
Name: Brad [name] DOB: 1946/09/17  Please be advised that the above-named patient will require a short-term nursing home stay -- anticipated 30 days or less for rehabilitation and strengthening. The plan is for return home.

## 2021-01-28 ENCOUNTER — Emergency Department (HOSPITAL_COMMUNITY)
Admission: EM | Admit: 2021-01-28 | Discharge: 2021-01-29 | Disposition: A | Discharge status: Home / Self Care | Payer: Medicare Other | Attending: Emergency Medicine | Admitting: Emergency Medicine

## 2021-01-28 ENCOUNTER — Encounter (HOSPITAL_COMMUNITY): Payer: Self-pay | Admitting: Emergency Medicine

## 2021-01-28 ENCOUNTER — Emergency Department (HOSPITAL_COMMUNITY): Payer: Medicare Other

## 2021-01-28 DIAGNOSIS — Z79899 Other long term (current) drug therapy: Secondary | ICD-10-CM | POA: Diagnosis not present

## 2021-01-28 DIAGNOSIS — U071 COVID-19: Secondary | ICD-10-CM | POA: Insufficient documentation

## 2021-01-28 DIAGNOSIS — D649 Anemia, unspecified: Secondary | ICD-10-CM | POA: Diagnosis not present

## 2021-01-28 DIAGNOSIS — E119 Type 2 diabetes mellitus without complications: Secondary | ICD-10-CM | POA: Diagnosis not present

## 2021-01-28 DIAGNOSIS — Z87891 Personal history of nicotine dependence: Secondary | ICD-10-CM | POA: Insufficient documentation

## 2021-01-28 DIAGNOSIS — Z8546 Personal history of malignant neoplasm of prostate: Secondary | ICD-10-CM | POA: Insufficient documentation

## 2021-01-28 DIAGNOSIS — I10 Essential (primary) hypertension: Secondary | ICD-10-CM | POA: Diagnosis not present

## 2021-01-28 LAB — CBC
HCT: 28.4 % — ABNORMAL LOW (ref 39.0–52.0)
Hemoglobin: 8.5 g/dL — ABNORMAL LOW (ref 13.0–17.0)
MCH: 28.1 pg (ref 26.0–34.0)
MCHC: 29.9 g/dL — ABNORMAL LOW (ref 30.0–36.0)
MCV: 93.7 fL (ref 80.0–100.0)
Platelets: 278 10*3/uL (ref 150–400)
RBC: 3.03 MIL/uL — ABNORMAL LOW (ref 4.22–5.81)
RDW: 14.6 % (ref 11.5–15.5)
WBC: 6.5 10*3/uL (ref 4.0–10.5)
nRBC: 0 % (ref 0.0–0.2)

## 2021-01-28 LAB — TYPE AND SCREEN
ABO/RH(D): O POS
Antibody Screen: NEGATIVE

## 2021-01-28 LAB — URINALYSIS, ROUTINE W REFLEX MICROSCOPIC
Bilirubin Urine: NEGATIVE
Glucose, UA: NEGATIVE mg/dL
Hgb urine dipstick: NEGATIVE
Ketones, ur: NEGATIVE mg/dL
Leukocytes,Ua: NEGATIVE
Nitrite: NEGATIVE
Protein, ur: NEGATIVE mg/dL
Specific Gravity, Urine: 1.015 (ref 1.005–1.030)
pH: 7 (ref 5.0–8.0)

## 2021-01-28 LAB — BASIC METABOLIC PANEL
Anion gap: 10 (ref 5–15)
BUN: 6 mg/dL — ABNORMAL LOW (ref 8–23)
CO2: 29 mmol/L (ref 22–32)
Calcium: 7.9 mg/dL — ABNORMAL LOW (ref 8.9–10.3)
Chloride: 98 mmol/L (ref 98–111)
Creatinine, Ser: 0.51 mg/dL — ABNORMAL LOW (ref 0.61–1.24)
GFR, Estimated: >60 mL/min (ref >60)
Glucose, Bld: 92 mg/dL (ref 70–99)
Potassium: 3.4 mmol/L — ABNORMAL LOW (ref 3.5–5.1)
Sodium: 137 mmol/L (ref 135–145)

## 2021-01-28 MED ORDER — SODIUM CHLORIDE 0.9 % IV BOLUS
500.0000 mL | Freq: Once | INTRAVENOUS | Status: AC
  Administered 2021-01-28: 500 mL via INTRAVENOUS

## 2021-01-28 NOTE — Discharge Instructions (Addendum)
Your hemoglobin is slightly lower than previously but there is no indication for blood transfusion at this time.  Follow up with your doctor to have that rechecked.  Return to the ED for any signs of intestinal bleeding, worsening symptoms

## 2021-01-28 NOTE — ED Triage Notes (Signed)
BIB EMS from SNF [facility] for CBC drawn 2 days ago. HCT 25 and HBG 8. No Blood thinners on MAR. No bloody stools per SNF. Baseline oriented per person and time. Positive Covid 01/21/2021. Unsure vaccination status.

## 2021-01-28 NOTE — ED Notes (Signed)
PTAR called @ 1718-per Melissa, RN called by Levada Dy

## 2021-01-28 NOTE — ED Provider Notes (Signed)
Pt seen by Dr Jeanell Sparrow.  Sent for anemia.   Rectal temp done in the ED, no stool but no blood noted.  Plan is to check labs.  At baseline has anemia.  If hgb over 8, plan on discharge back to the facility.  Clinical Course as of 01/28/21 1712  Fri Jan 28, 2021  1705 Labs reviewed.  Hemoglobin is 8.5.  Metabolic panel is normal.  Urinalysis without signs of infection. [IW]  8032 Chest x-ray without acute findings. [JK]  1705 Hemoglobin 1 month ago was 9.1.  No signs of active bleeding. [JK]    Clinical Course User Index [JK] Dorie Rank, MD      Dorie Rank, MD 01/28/21 [phone]

## 2021-01-28 NOTE — ED Notes (Signed)
Patient awaiting DC back to [facility] via [facility]. Patient total care and with contracted limbs x 4.

## 2021-01-28 NOTE — ED Provider Notes (Signed)
[name] EM[facility]Y DEPARTMENT Provider Note   CSN: [id number] Arrival date & time: 01/28/21  1326     History Chief Complaint  Patient presents with  . Anemia    Brad [name] is a 75 y.o. male. Level 5 caveat HPI 75 year old male presents today from [facility] facility with reports that he is anemic.  They report no blood thinners or bloody stools.  Reports Covid + January 21, 2021.  Patient denies knowing anything about his hemoglobin being low.  He states he had Covid a year ago.  He states that he is cold.  Discussed with [place] at [facility].  Report from NH is that patient is contractures but she reports that he is able to get up to chair with lift.  He was positive for covid on Tuesday.  No symptoms.  CXRs reported as clear.  He has been baseline some confusion. HGB 8 bp 143/70 NO bleeding reported  Past Medical History:  Diagnosis Date  . Anemia   . Arthritis   . Cancer [facility]) 2017   prostate  . Depression   . Diabetes mellitus without complication ([place])   . History of stomach ulcers   . Hypertension   . Seizures ([place])   . Substance abuse [facility])     Patient Active Problem List   Diagnosis Date Noted  . Protein-calorie malnutrition, severe 12/30/2020  . Cachexia ([place]) 12/25/2020  . Pressure injury of skin 12/06/2020  . Seizure ([place]) 12/05/2020  . Diabetes ([place]) 12/05/2020  . Breakthrough seizure ([place]) 12/05/2020  . Cervical spinal stenosis 12/14/2017  . Abnormal CT scan, neck 12/14/2017  . History of stroke 12/12/2017  . Hyperlipidemia 12/12/2017  . Seizure disorder ([place]) 12/12/2017  . KNEE PAIN, RIGHT 06/11/2009  . NECK PAIN 06/11/2009  . Hypertension 02/13/2008  . DENTAL CARIES 02/11/2008    Past Surgical History:  Procedure Laterality Date  . herniated disc    . KNEE SURGERY Right        Family History  Problem Relation Age of Onset  . Seizures Neg Hx     Social History   Tobacco Use  . Smoking  status: Former Smoker    Types: Cigarettes  . Smokeless tobacco: Never Used  . Tobacco comment: occasional- patient smokes some marijauna from time to time  Vaping Use  . Vaping Use: Never used  Substance Use Topics  . Alcohol use: Not Currently    Alcohol/week: 2.0 - 3.0 standard drinks    Types: 2 - 3 Cans of beer per week    Comment: occasional  . Drug use: Not Currently    Types: Marijuana    Comment: occasional    Home Medications Prior to Admission medications   Medication Sig Start Date End Date Taking? Authorizing Provider  acetaminophen (TYLENOL) 500 MG tablet Take 1,000 mg by mouth every 6 (six) hours as needed for moderate pain.    [provider]  amLODipine (NORVASC) 2.5 MG tablet Take 1 tablet (2.5 mg total) by mouth daily. 04/19/18   Tereasa Coop, PA-C  atorvastatin (LIPITOR) 20 MG tablet Take 1 tablet (20 mg total) by mouth daily. Patient taking differently: Take 20 mg by mouth at bedtime. 04/19/18   Tereasa Coop, PA-C  divalproex (DEPAKOTE SPRINKLE) 125 MG capsule Take 4 capsules (500 mg total) by mouth every 12 (twelve) hours. 12/27/20   Regalado, Belkys A, MD  FLUoxetine (PROZAC) 20 MG capsule Take 2 capsules (40 mg total) by mouth  daily. Patient not taking: Reported on 12/05/2020 04/19/18   Tereasa Coop, PA-C  mirtazapine (REMERON) 30 MG tablet Take 30 mg by mouth at bedtime. 10/30/19   [provider]  Multiple Vitamin (MULTIVITAMIN WITH MINERALS) TABS tablet Take 1 tablet by mouth daily. 12/27/20   Regalado, Belkys A, MD  nystatin (MYCOSTATIN) 100000 UNIT/ML suspension Take 5 mLs (500,000 Units total) by mouth 4 (four) times daily. 12/27/20   Regalado, Belkys A, MD  tamsulosin (FLOMAX) 0.4 MG CAPS capsule Take 0.4 mg by mouth at bedtime. 05/03/20   [provider]  temazepam (RESTORIL) 7.5 MG capsule Take 1 capsule (7.5 mg total) by mouth at bedtime as needed for sleep. 12/27/20   Regalado, Cassie Freer, MD    Allergies    Patient has no  known allergies.  Review of Systems   Review of Systems  Physical Exam Updated Vital Signs BP (!) 130/58 (BP Location: Right Arm)   Pulse 72   Temp 97.9 F (36.6 C) (Axillary)   Resp 16   SpO2 100%   Physical Exam Vitals and nursing note reviewed.  Constitutional:      General: He is not in acute distress.    Appearance: He is ill-appearing.  HENT:     Head: Normocephalic.     Right Ear: External ear normal.     Left Ear: External ear normal.     Nose: Nose normal.     Mouth/Throat:     Pharynx: Oropharynx is clear.  Eyes:     Pupils: Pupils are equal, round, and reactive to light.  Cardiovascular:     Rate and Rhythm: Normal rate and regular rhythm.     Pulses: Normal pulses.     Heart sounds: Normal heart sounds.  Pulmonary:     Effort: Pulmonary effort is normal.     Breath sounds: Normal breath sounds.  Abdominal:     General: Abdomen is flat.     Palpations: Abdomen is soft.  Musculoskeletal:     Cervical back: Normal range of motion.     Comments: All extremities are contractured  Skin:    General: Skin is warm and dry.     Capillary Refill: Capillary refill takes less than 2 seconds.  Neurological:     General: No focal deficit present.     Mental Status: He is alert.     Comments: Patient oriented to person and place but not to date All extremities are contractured No lateralized deficits are noted  Psychiatric:        Mood and Affect: Mood normal.     ED Results / Procedures / Treatments   Labs (all labs ordered are listed, but only abnormal results are displayed) Labs Reviewed  CBC  BASIC METABOLIC PANEL  URINALYSIS, ROUTINE W REFLEX MICROSCOPIC  POC SARS CORONAVIRUS 2 AG -  ED  TYPE AND SCREEN    EKG EKG Interpretation  Date/Time:  Friday January 28 2021 13:41:41 EST Ventricular Rate:  157 PR Interval:    QRS Duration: 85 QT Interval:  368 QTC Calculation: 512 R Axis:   -3 Text Interpretation: Normal sinus rhythm Paired  ventricular premature complexes Abnormal R-wave progression, late transition Borderline T abnormalities, inferior leads Poor data quality, interpretation may be adversely affected Reconfirmed by Pattricia Boss [phone]) on 01/28/2021 3:53:50 PM   Radiology No results found.  Procedures Procedures   Medications Ordered in ED Medications  sodium chloride 0.9 % bolus 500 mL (has no administration in time range)  ED Course  I have reviewed the triage vital signs and the nursing notes.  Pertinent labs & imaging results that were available during my care of the patient were reviewed by me and considered in my medical decision making (see chart for details).    MDM Rules/Calculators/A&P                          Patient from nursing home with multiple chronic illnesses who presents today with reports of anemia.  Review of records revealed that in January, while hospitalized, he had baseline hemoglobin between 7 and 9 and was transfused 1 unit of packed red blood cells.  No evidence of overt GI bleeding noted. Patient appears hemodynamically stable.  He is pending hemoglobin.  I discussed his care with Dr. Tomi Bamberger.  If patient's hemoglobin is greater than 8, would not transfuse patient could be continued to be followed as an outpatient. He has had chemistries and Final Clinical Impression(s) / ED Diagnoses Final diagnoses:  Anemia, unspecified type    Rx / DC Orders ED Discharge Orders    None       Pattricia Boss, MD 01/28/21 1554

## 2021-01-28 NOTE — ED Notes (Signed)
This RN called d/c report to RN at [facility] at [place]. After this RN finished giving pt's discharge information over the phone, the [facility] RN asked "Do you expect me to write all of this down, or are you reading aloud to yourself?" This RN asked the facility nurse which part of report needed to be repeating, to which he answered, "Nothing. Just send the patient back to [place]." and hung up the phone on this RN.

## 2021-01-29 NOTE — ED Notes (Signed)
Called [facility] for update. Pt still on list no ETA given

## 2021-01-29 NOTE — ED Notes (Signed)
2nd on [facility] list

## 2021-02-28 ENCOUNTER — Inpatient Hospital Stay (HOSPITAL_COMMUNITY)
Admission: EM | Admit: 2021-02-28 | Discharge: 2021-03-02 | DRG: 535 | Disposition: A | Discharge status: Skilled Nursing Facility | Payer: Medicare Other | Source: Skilled Nursing Facility | Attending: Internal Medicine | Admitting: Internal Medicine

## 2021-02-28 ENCOUNTER — Emergency Department (HOSPITAL_COMMUNITY): Payer: Medicare Other

## 2021-02-28 ENCOUNTER — Encounter (HOSPITAL_COMMUNITY)
Admission: EM | Disposition: A | Discharge status: Skilled Nursing Facility | Payer: Self-pay | Source: Skilled Nursing Facility | Attending: Student in an Organized Health Care Education/Training Program

## 2021-02-28 ENCOUNTER — Encounter (HOSPITAL_COMMUNITY): Payer: Self-pay

## 2021-02-28 ENCOUNTER — Other Ambulatory Visit: Payer: Self-pay

## 2021-02-28 DIAGNOSIS — Z8711 Personal history of peptic ulcer disease: Secondary | ICD-10-CM

## 2021-02-28 DIAGNOSIS — E785 Hyperlipidemia, unspecified: Secondary | ICD-10-CM | POA: Diagnosis present

## 2021-02-28 DIAGNOSIS — S72001A Fracture of unspecified part of neck of right femur, initial encounter for closed fracture: Secondary | ICD-10-CM | POA: Diagnosis not present

## 2021-02-28 DIAGNOSIS — Z7401 Bed confinement status: Secondary | ICD-10-CM

## 2021-02-28 DIAGNOSIS — Z8616 Personal history of COVID-19: Secondary | ICD-10-CM

## 2021-02-28 DIAGNOSIS — E119 Type 2 diabetes mellitus without complications: Secondary | ICD-10-CM | POA: Diagnosis present

## 2021-02-28 DIAGNOSIS — F0390 Unspecified dementia without behavioral disturbance: Secondary | ICD-10-CM | POA: Diagnosis present

## 2021-02-28 DIAGNOSIS — Z87891 Personal history of nicotine dependence: Secondary | ICD-10-CM

## 2021-02-28 DIAGNOSIS — G40909 Epilepsy, unspecified, not intractable, without status epilepticus: Secondary | ICD-10-CM | POA: Diagnosis present

## 2021-02-28 DIAGNOSIS — M1990 Unspecified osteoarthritis, unspecified site: Secondary | ICD-10-CM | POA: Diagnosis present

## 2021-02-28 DIAGNOSIS — Z993 Dependence on wheelchair: Secondary | ICD-10-CM

## 2021-02-28 DIAGNOSIS — S72009A Fracture of unspecified part of neck of unspecified femur, initial encounter for closed fracture: Secondary | ICD-10-CM | POA: Diagnosis present

## 2021-02-28 DIAGNOSIS — W06XXXA Fall from bed, initial encounter: Secondary | ICD-10-CM | POA: Diagnosis present

## 2021-02-28 DIAGNOSIS — Z79899 Other long term (current) drug therapy: Secondary | ICD-10-CM

## 2021-02-28 DIAGNOSIS — U071 COVID-19: Secondary | ICD-10-CM | POA: Diagnosis present

## 2021-02-28 DIAGNOSIS — E43 Unspecified severe protein-calorie malnutrition: Secondary | ICD-10-CM | POA: Diagnosis present

## 2021-02-28 DIAGNOSIS — I10 Essential (primary) hypertension: Secondary | ICD-10-CM | POA: Diagnosis present

## 2021-02-28 LAB — CBC WITH DIFFERENTIAL/PLATELET
Abs Immature Granulocytes: 0.02 10*3/uL (ref 0.00–0.07)
Basophils Absolute: 0 10*3/uL (ref 0.0–0.1)
Basophils Relative: 0 %
Eosinophils Absolute: 0.1 10*3/uL (ref 0.0–0.5)
Eosinophils Relative: 1 %
HCT: 36.5 % — ABNORMAL LOW (ref 39.0–52.0)
Hemoglobin: 11.1 g/dL — ABNORMAL LOW (ref 13.0–17.0)
Immature Granulocytes: 0 %
Lymphocytes Relative: 32 %
Lymphs Abs: 2.8 10*3/uL (ref 0.7–4.0)
MCH: 29.4 pg (ref 26.0–34.0)
MCHC: 30.4 g/dL (ref 30.0–36.0)
MCV: 96.8 fL (ref 80.0–100.0)
Monocytes Absolute: 0.5 10*3/uL (ref 0.1–1.0)
Monocytes Relative: 6 %
Neutro Abs: 5.3 10*3/uL (ref 1.7–7.7)
Neutrophils Relative %: 61 %
Platelets: 235 10*3/uL (ref 150–400)
RBC: 3.77 MIL/uL — ABNORMAL LOW (ref 4.22–5.81)
RDW: 16 % — ABNORMAL HIGH (ref 11.5–15.5)
WBC: 8.7 10*3/uL (ref 4.0–10.5)
nRBC: 0 % (ref 0.0–0.2)

## 2021-02-28 LAB — BASIC METABOLIC PANEL
Anion gap: 7 (ref 5–15)
BUN: 6 mg/dL — ABNORMAL LOW (ref 8–23)
CO2: 32 mmol/L (ref 22–32)
Calcium: 9 mg/dL (ref 8.9–10.3)
Chloride: 100 mmol/L (ref 98–111)
Creatinine, Ser: 0.59 mg/dL — ABNORMAL LOW (ref 0.61–1.24)
GFR, Estimated: >60 mL/min (ref >60)
Glucose, Bld: 74 mg/dL (ref 70–99)
Potassium: 4.1 mmol/L (ref 3.5–5.1)
Sodium: 139 mmol/L (ref 135–145)

## 2021-02-28 LAB — TYPE AND SCREEN
ABO/RH(D): O POS
Antibody Screen: NEGATIVE

## 2021-02-28 LAB — PROTIME-INR
INR: 1.4 — ABNORMAL HIGH (ref 0.8–1.2)
Prothrombin Time: 16.3 seconds — ABNORMAL HIGH (ref 11.4–15.2)

## 2021-02-28 LAB — SARS CORONAVIRUS 2 (TAT 6-24 HRS): SARS Coronavirus 2: NEGATIVE

## 2021-02-28 SURGERY — FIXATION, FEMUR, NECK, PERCUTANEOUS, USING SCREW
Anesthesia: General | Site: Hip | Laterality: Right

## 2021-02-28 MED ORDER — VITAMIN D 25 MCG (1000 UNIT) PO TABS
500.0000 [IU] | ORAL_TABLET | Freq: Every day | ORAL | Status: DC
  Administered 2021-03-01 – 2021-03-02 (×2): 500 [IU] via ORAL
  Filled 2021-02-28 (×2): qty 1

## 2021-02-28 MED ORDER — MORPHINE SULFATE (PF) 4 MG/ML IV SOLN
4.0000 mg | INTRAVENOUS | Status: DC | PRN
  Administered 2021-02-28: 4 mg via INTRAVENOUS
  Filled 2021-02-28: qty 1

## 2021-02-28 MED ORDER — LACTATED RINGERS IV SOLN: INTRAVENOUS | Status: DC

## 2021-02-28 MED ORDER — ENOXAPARIN SODIUM 40 MG/0.4ML ~~LOC~~ SOLN
40.0000 mg | SUBCUTANEOUS | Status: DC
  Administered 2021-03-01 – 2021-03-02 (×2): 40 mg via SUBCUTANEOUS
  Filled 2021-02-28 (×2): qty 0.4

## 2021-02-28 MED ORDER — MIRTAZAPINE 30 MG PO TABS
30.0000 mg | ORAL_TABLET | Freq: Every day | ORAL | Status: DC
  Administered 2021-02-28 – 2021-03-01 (×2): 30 mg via ORAL
  Filled 2021-02-28 (×2): qty 2
  Filled 2021-02-28 (×3): qty 1

## 2021-02-28 MED ORDER — ATORVASTATIN CALCIUM 10 MG PO TABS
20.0000 mg | ORAL_TABLET | Freq: Every day | ORAL | Status: DC
  Administered 2021-02-28 – 2021-03-01 (×2): 20 mg via ORAL
  Filled 2021-02-28 (×2): qty 2

## 2021-02-28 MED ORDER — TEMAZEPAM 7.5 MG PO CAPS: 7.5000 mg | ORAL_CAPSULE | Freq: Every evening | ORAL | Status: DC | PRN

## 2021-02-28 MED ORDER — HYDROCODONE-ACETAMINOPHEN 5-325 MG PO TABS
1.0000 | ORAL_TABLET | Freq: Four times a day (QID) | ORAL | Status: DC | PRN
  Administered 2021-03-01: 1 via ORAL
  Filled 2021-02-28: qty 1

## 2021-02-28 MED ORDER — ACETAMINOPHEN 500 MG PO TABS
1000.0000 mg | ORAL_TABLET | Freq: Four times a day (QID) | ORAL | Status: DC | PRN
  Administered 2021-02-28 – 2021-03-02 (×3): 1000 mg via ORAL
  Filled 2021-02-28 (×4): qty 2

## 2021-02-28 MED ORDER — TAMSULOSIN HCL 0.4 MG PO CAPS
0.4000 mg | ORAL_CAPSULE | Freq: Every day | ORAL | Status: DC
  Administered 2021-02-28 – 2021-03-01 (×2): 0.4 mg via ORAL
  Filled 2021-02-28 (×2): qty 1

## 2021-02-28 MED ORDER — ADULT MULTIVITAMIN W/MINERALS CH
1.0000 | ORAL_TABLET | Freq: Every day | ORAL | Status: DC
  Administered 2021-03-01 – 2021-03-02 (×2): 1 via ORAL
  Filled 2021-02-28 (×2): qty 1

## 2021-02-28 MED ORDER — SENNA 8.6 MG PO TABS
1.0000 | ORAL_TABLET | Freq: Two times a day (BID) | ORAL | Status: DC
  Administered 2021-02-28 – 2021-03-01 (×3): 8.6 mg via ORAL
  Filled 2021-02-28 (×4): qty 1

## 2021-02-28 MED ORDER — DIVALPROEX SODIUM 125 MG PO CSDR
500.0000 mg | DELAYED_RELEASE_CAPSULE | Freq: Two times a day (BID) | ORAL | Status: DC
  Administered 2021-02-28 – 2021-03-02 (×4): 500 mg via ORAL
  Filled 2021-02-28 (×5): qty 4

## 2021-02-28 MED ORDER — FLUOXETINE HCL 20 MG PO CAPS
40.0000 mg | ORAL_CAPSULE | Freq: Every day | ORAL | Status: DC
  Administered 2021-03-01 – 2021-03-02 (×2): 40 mg via ORAL
  Filled 2021-02-28 (×2): qty 2

## 2021-02-28 MED ORDER — POLYETHYLENE GLYCOL 3350 17 G PO PACK: 17.0000 g | PACK | Freq: Every day | ORAL | Status: DC | PRN

## 2021-02-28 MED ORDER — ONDANSETRON HCL 4 MG/2ML IJ SOLN
4.0000 mg | Freq: Once | INTRAMUSCULAR | Status: AC
  Administered 2021-02-28: 4 mg via INTRAVENOUS
  Filled 2021-02-28: qty 2

## 2021-02-28 NOTE — ED Provider Notes (Signed)
[facility] EMERGENCY DEPARTMENT Provider Note   CSN: [id number] Arrival date & time: 02/28/21  0329   History Chief Complaint  Patient presents with  . Fall    Brad [name] is a 75 y.o. male.  The history is provided by the patient.  Fall  He has a history of hypertension, diabetes, hyperlipidemia and was transferred here from skilled nursing facility after a fall out of bed.  He states that he landed on his right side and was complaining of pain in his right leg and right arm, but states that pain has pretty much resolved at this point.  He denies head injury.  He is not on any anticoagulants.  Of note, patient is completely bedbound.  Past Medical History:  Diagnosis Date  . Anemia   . Arthritis   . Cancer [facility]) 2017   prostate  . Depression   . Diabetes mellitus without complication ([place])   . History of stomach ulcers   . Hypertension   . Seizures ([place])   . Substance abuse [facility])     Patient Active Problem List   Diagnosis Date Noted  . Protein-calorie malnutrition, severe 12/30/2020  . Cachexia ([place]) 12/25/2020  . Pressure injury of skin 12/06/2020  . Seizure ([place]) 12/05/2020  . Diabetes ([place]) 12/05/2020  . Breakthrough seizure ([place]) 12/05/2020  . Cervical spinal stenosis 12/14/2017  . Abnormal CT scan, neck 12/14/2017  . History of stroke 12/12/2017  . Hyperlipidemia 12/12/2017  . Seizure disorder ([place]) 12/12/2017  . KNEE PAIN, RIGHT 06/11/2009  . NECK PAIN 06/11/2009  . Hypertension 02/13/2008  . DENTAL CARIES 02/11/2008    Past Surgical History:  Procedure Laterality Date  . herniated disc    . KNEE SURGERY Right        Family History  Problem Relation Age of Onset  . Seizures Neg Hx     Social History   Tobacco Use  . Smoking status: Former Smoker    Types: Cigarettes  . Smokeless tobacco: Never Used  . Tobacco comment: occasional- patient smokes some marijauna from time to time  Vaping Use  . Vaping Use: Never  used  Substance Use Topics  . Alcohol use: Not Currently    Alcohol/week: 2.0 - 3.0 standard drinks    Types: 2 - 3 Cans of beer per week    Comment: occasional  . Drug use: Not Currently    Types: Marijuana    Comment: occasional    Home Medications Prior to Admission medications   Medication Sig Start Date End Date Taking? Authorizing Provider  acetaminophen (TYLENOL) 500 MG tablet Take 1,000 mg by mouth every 6 (six) hours as needed for moderate pain.    [provider]  amLODipine (NORVASC) 2.5 MG tablet Take 1 tablet (2.5 mg total) by mouth daily. 04/19/18   Tereasa Coop, PA-C  atorvastatin (LIPITOR) 20 MG tablet Take 1 tablet (20 mg total) by mouth daily. Patient taking differently: Take 20 mg by mouth at bedtime. 04/19/18   Tereasa Coop, PA-C  divalproex (DEPAKOTE SPRINKLE) 125 MG capsule Take 4 capsules (500 mg total) by mouth every 12 (twelve) hours. 12/27/20   Regalado, Belkys A, MD  FLUoxetine (PROZAC) 20 MG capsule Take 2 capsules (40 mg total) by mouth daily. Patient not taking: Reported on 12/05/2020 04/19/18   Tereasa Coop, PA-C  mirtazapine (REMERON) 30 MG tablet Take 30 mg by mouth at bedtime. 10/30/19   [provider]  Multiple Vitamin (MULTIVITAMIN WITH  MINERALS) TABS tablet Take 1 tablet by mouth daily. 12/27/20   Regalado, Belkys A, MD  nystatin (MYCOSTATIN) 100000 UNIT/ML suspension Take 5 mLs (500,000 Units total) by mouth 4 (four) times daily. 12/27/20   Regalado, Belkys A, MD  tamsulosin (FLOMAX) 0.4 MG CAPS capsule Take 0.4 mg by mouth at bedtime. 05/03/20   [provider]  temazepam (RESTORIL) 7.5 MG capsule Take 1 capsule (7.5 mg total) by mouth at bedtime as needed for sleep. 12/27/20   Regalado, Cassie Freer, MD    Allergies    Patient has no known allergies.  Review of Systems   Review of Systems  All other systems reviewed and are negative.   Physical Exam Updated Vital Signs BP (!) 146/67 (BP Location: Left Arm)   Pulse  78   Temp 98.2 F (36.8 C) (Oral)   Resp 12   Ht 6\' 1"  (1.854 m)   Wt 58 kg   SpO2 100%   BMI 16.87 kg/m   Physical Exam Vitals and nursing note reviewed.   75 year old male, resting comfortably and in no acute distress. Vital signs are significant for elevated blood pressure. Oxygen saturation is 100%, which is normal. Head is normocephalic and atraumatic. PERRLA, EOMI. Oropharynx is clear. Neck is nontender without adenopathy or JVD. Back is nontender and there is no CVA tenderness. Lungs are clear without rales, wheezes, or rhonchi. Chest is nontender. Heart has regular rate and rhythm without murmur. Abdomen is soft, flat, nontender without masses or hepatosplenomegaly and peristalsis is normoactive. Extremities have significant flexion contractures.  There is no no cyanosis or edema.  No tenderness elicited. Skin is warm and dry without rash. Neurologic: Mental status is normal, cranial nerves are intact, limited movement noted of all extremities but this seems to be related to contractures and not a neurologic process.  ED Results / Procedures / Treatments   Labs (all labs ordered are listed, but only abnormal results are displayed) Labs Reviewed  SARS CORONAVIRUS 2 (TAT 6-24 HRS)  BASIC METABOLIC PANEL  CBC WITH DIFFERENTIAL/PLATELET  PROTIME-INR  TYPE AND SCREEN    EKG EKG Interpretation  Date/Time:  Monday February 28 2021 05:37:48 EDT Ventricular Rate:  95 PR Interval:    QRS Duration: 213 QT Interval:  386 QTC Calculation: 486 R Axis:   -24 Text Interpretation: Sinus rhythm Nonspecific intraventricular conduction delay Artifact in lead(s) I III aVR aVL aVF V1 V2 V3 V4 V5 V6 When compared with ECG of 01/28/2021, No significant change was found Confirmed by Delora Fuel ([id number]) on 02/28/2021 5:45:14 AM   Radiology DG Chest 1 View  Result Date: 02/28/2021 CLINICAL DATA:  75 year old male preoperative study right femoral neck fracture. EXAM: CHEST  1 VIEW  COMPARISON:  Portable chest 01/28/2021 and earlier. FINDINGS: Portable AP supine view at 0518 hours. Lung volumes and mediastinal contours remain normal. Allowing for portable technique the lungs are clear. Visualized tracheal air column is within normal limits. Prior cervical ACDF. Stable visualized osseous structures. Negative visible bowel gas pattern. IMPRESSION: Negative.  No acute cardiopulmonary abnormality. Electronically Signed   By: Genevie Ann M.D.   On: 02/28/2021 05:29   CT Head Wo Contrast  Result Date: 02/28/2021 CLINICAL DATA:  Neck trauma. EXAM: CT HEAD WITHOUT CONTRAST CT CERVICAL SPINE WITHOUT CONTRAST TECHNIQUE: Multidetector CT imaging of the head and cervical spine was performed following the standard protocol without intravenous contrast. Multiplanar CT image reconstructions of the cervical spine were also generated. COMPARISON:  MR cervical spine  02/22/2019, CT head 12/04/2020 FINDINGS: CT HEAD FINDINGS Brain: Stable prominence of the lateral ventricles may be related to central predominant atrophy, although a component of normal pressure/communicating hydrocephalus cannot be excluded. Patchy and confluent areas of decreased attenuation are noted throughout the deep and periventricular white matter of the cerebral hemispheres bilaterally, compatible with chronic microvascular ischemic disease. No evidence of large-territorial acute infarction. No parenchymal hemorrhage. No mass lesion. No extra-axial collection. No mass effect or midline shift. No hydrocephalus. Basilar cisterns are patent. Vascular: No hyperdense vessel. Skull: No acute fracture or focal lesion. Sinuses/Orbits: Paranasal sinuses and mastoid air cells are clear. The orbits are unremarkable. Other: None. CT CERVICAL SPINE FINDINGS Alignment: Normal. Skull base and vertebrae: Anterior fusion at the C3 through C6 levels. No CT findings to suggest surgical hardware complication. Multilevel degenerative changes. No acute fracture.  No aggressive appearing focal osseous lesion or focal pathologic process. Soft tissues and spinal canal: No prevertebral fluid or swelling. No visible canal hematoma. Upper chest: Trace biapical pleural/pulmonary scarring. Other: Severe atherosclerotic plaque of the left carotid arteries in the neck. IMPRESSION: 1. No acute intracranial abnormality, in a patient with cerebral atrophy although a component of normal pressure/communicating hydrocephalus cannot be excluded. 2. No acute displaced fracture or traumatic listhesis of the cervical spine in a patient status post anterior fusion at the C3 through C6 levels. 3. Severe atherosclerotic plaque of the left carotid arteries in the neck. Recommend nonemergent ultrasound carotid arteries. Electronically Signed   By: Iven Finn M.D.   On: 02/28/2021 04:51   CT Cervical Spine Wo Contrast  Result Date: 02/28/2021 CLINICAL DATA:  Neck trauma. EXAM: CT HEAD WITHOUT CONTRAST CT CERVICAL SPINE WITHOUT CONTRAST TECHNIQUE: Multidetector CT imaging of the head and cervical spine was performed following the standard protocol without intravenous contrast. Multiplanar CT image reconstructions of the cervical spine were also generated. COMPARISON:  MR cervical spine 02/22/2019, CT head 12/04/2020 FINDINGS: CT HEAD FINDINGS Brain: Stable prominence of the lateral ventricles may be related to central predominant atrophy, although a component of normal pressure/communicating hydrocephalus cannot be excluded. Patchy and confluent areas of decreased attenuation are noted throughout the deep and periventricular white matter of the cerebral hemispheres bilaterally, compatible with chronic microvascular ischemic disease. No evidence of large-territorial acute infarction. No parenchymal hemorrhage. No mass lesion. No extra-axial collection. No mass effect or midline shift. No hydrocephalus. Basilar cisterns are patent. Vascular: No hyperdense vessel. Skull: No acute fracture or  focal lesion. Sinuses/Orbits: Paranasal sinuses and mastoid air cells are clear. The orbits are unremarkable. Other: None. CT CERVICAL SPINE FINDINGS Alignment: Normal. Skull base and vertebrae: Anterior fusion at the C3 through C6 levels. No CT findings to suggest surgical hardware complication. Multilevel degenerative changes. No acute fracture. No aggressive appearing focal osseous lesion or focal pathologic process. Soft tissues and spinal canal: No prevertebral fluid or swelling. No visible canal hematoma. Upper chest: Trace biapical pleural/pulmonary scarring. Other: Severe atherosclerotic plaque of the left carotid arteries in the neck. IMPRESSION: 1. No acute intracranial abnormality, in a patient with cerebral atrophy although a component of normal pressure/communicating hydrocephalus cannot be excluded. 2. No acute displaced fracture or traumatic listhesis of the cervical spine in a patient status post anterior fusion at the C3 through C6 levels. 3. Severe atherosclerotic plaque of the left carotid arteries in the neck. Recommend nonemergent ultrasound carotid arteries. Electronically Signed   By: Iven Finn M.D.   On: 02/28/2021 04:51   DG Hip Unilat W or Wo Pelvis  2-3 Views Right  Result Date: 02/28/2021 CLINICAL DATA:  Fall. Pt was brought after rolling off his bed and landing on his right hip. Pt unable to extend both legs for imaging. EXAM: DG HIP (WITH OR WITHOUT PELVIS) 2-3V RIGHT COMPARISON:  Xr left hip 05/29/20 FINDINGS: Impacted right femoral neck fracture. No right hip dislocation. Frontal views of the left hip grossly unremarkable. Vascular calcifications. IMPRESSION: Impacted right femoral neck fracture. Electronically Signed   By: Iven Finn M.D.   On: 02/28/2021 04:23    Procedures Procedures   Medications Ordered in ED Medications - No data to display  ED Course  I have reviewed the triage vital signs and the nursing notes.  Pertinent labs & imaging results that  were available during my care of the patient were reviewed by me and considered in my medical decision making (see chart for details).  MDM Rules/Calculators/A&P Fall from bed without evidence of significant injury.  Will send for x-rays of hip and CT scans of head and cervical spine.  Old records are reviewed, and he does have prior ED visits for falls.  CT of head and cervical spine are unremarkable.  Hip x-ray shows an impacted femoral neck fracture.  He will need to be admitted for surgical management.  Chest x-ray is obtained and shows no acute process.  ECG is unchanged from prior.  Admission labs have been ordered, results pending.  Case has been discussed with Dr. Alvan Dame, on-call for orthopedics, who agrees to see the patient in consultation and will make arrangements for surgical management.  Will need to discuss with hospitalists, once labs are back.  Final Clinical Impression(s) / ED Diagnoses Final diagnoses:  Closed fracture of neck of right femur, initial encounter ([place])  Fall from bed, initial encounter    Rx / DC Orders ED Discharge Orders    None       Delora Fuel, MD 00/71/21 [phone]

## 2021-02-28 NOTE — Consult Note (Signed)
Reason for Consult:  Right femoral neck fracture Referring Physician: ED Physician  Brad [name] is an 75 y.o. male.  HPI: He has a history of hypertension, diabetes, hyperlipidemia and was transferred here from skilled nursing facility after a fall out of bed.  He states that he landed on his right side and was complaining of pain in his right leg and right arm, but states that pain has pretty much resolved at this point.  He denies head injury.  He is not on any anticoagulants.  Of note, patient is completely bedbound. Ortho consulted.  History obtained from previous notes and confirmation by the patient.   Past Medical History:  Diagnosis Date  . Anemia   . Arthritis   . Cancer [facility]) 2017   prostate  . Depression   . Diabetes mellitus without complication ([place])   . History of stomach ulcers   . Hypertension   . Seizures ([place])   . Substance abuse [facility])     Past Surgical History:  Procedure Laterality Date  . herniated disc    . KNEE SURGERY Right     Family History  Problem Relation Age of Onset  . Seizures Neg Hx     Social History:  reports that he has quit smoking. His smoking use included cigarettes. He has never used smokeless tobacco. He reports previous alcohol use of about 2.0 - 3.0 standard drinks of alcohol per week. He reports previous drug use. Drug: Marijuana.  Allergies: No Known Allergies  Medications: I have reviewed the patient's current medications.  Results for orders placed or performed during the hospital encounter of 02/28/21 (from the past 48 hour(s))  Basic metabolic panel     Status: Abnormal   Collection Time: 02/28/21  6:10 AM  Result Value Ref Range   Sodium 139 135 - 145 mmol/L   Potassium 4.1 3.5 - 5.1 mmol/L   Chloride 100 98 - 111 mmol/L   CO2 32 22 - 32 mmol/L   Glucose, Bld 74 70 - 99 mg/dL    Comment: Glucose reference range applies only to samples taken after fasting for at least 8 hours.   BUN 6 (L) 8 - 23 mg/dL    Creatinine, Ser 0.59 (L) 0.61 - 1.24 mg/dL   Calcium 9.0 8.9 - 10.3 mg/dL   GFR, Estimated >60 >60 mL/min    Comment: (NOTE) Calculated using the CKD-EPI Creatinine Equation (2021)    Anion gap 7 5 - 15    Comment: Performed at [facility] [address] [place], [place] [address]  CBC WITH DIFFERENTIAL     Status: Abnormal   Collection Time: 02/28/21  6:10 AM  Result Value Ref Range   WBC 8.7 4.0 - 10.5 K/uL   RBC 3.77 (L) 4.22 - 5.81 MIL/uL   Hemoglobin 11.1 (L) 13.0 - 17.0 g/dL   HCT 36.5 (L) 39.0 - 52.0 %   MCV 96.8 80.0 - 100.0 fL   MCH 29.4 26.0 - 34.0 pg   MCHC 30.4 30.0 - 36.0 g/dL   RDW 16.0 (H) 11.5 - 15.5 %   Platelets 235 150 - 400 K/uL   nRBC 0.0 0.0 - 0.2 %   Neutrophils Relative % 61 %   Neutro Abs 5.3 1.7 - 7.7 K/uL   Lymphocytes Relative 32 %   Lymphs Abs 2.8 0.7 - 4.0 K/uL   Monocytes Relative 6 %   Monocytes Absolute 0.5 0.1 - 1.0 K/uL   Eosinophils Relative 1 %  Eosinophils Absolute 0.1 0.0 - 0.5 K/uL   Basophils Relative 0 %   Basophils Absolute 0.0 0.0 - 0.1 K/uL   Immature Granulocytes 0 %   Abs Immature Granulocytes 0.02 0.00 - 0.07 K/uL    Comment: Performed at [facility], [place] [address], [place], [place] [address]  Protime-INR     Status: Abnormal   Collection Time: 02/28/21  6:10 AM  Result Value Ref Range   Prothrombin Time 16.3 (H) 11.4 - 15.2 seconds   INR 1.4 (H) 0.8 - 1.2    Comment: (NOTE) INR goal varies based on device and disease states. Performed at [facility], [place] [address], [place], [place] [address]   Type and screen [place]     Status: None (Preliminary result)   Collection Time: 02/28/21  6:10 AM  Result Value Ref Range   ABO/RH(D) O POS    Antibody Screen      NEG Performed at [facility] [address] [place], [place] [address]    Sample Expiration PENDING     DG Chest 1 View  Result Date: 02/28/2021 CLINICAL DATA:  75 year old male preoperative study right  femoral neck fracture. EXAM: CHEST  1 VIEW COMPARISON:  Portable chest 01/28/2021 and earlier. FINDINGS: Portable AP supine view at 0518 hours. Lung volumes and mediastinal contours remain normal. Allowing for portable technique the lungs are clear. Visualized tracheal air column is within normal limits. Prior cervical ACDF. Stable visualized osseous structures. Negative visible bowel gas pattern. IMPRESSION: Negative.  No acute cardiopulmonary abnormality. Electronically Signed   By: Genevie Ann M.D.   On: 02/28/2021 05:29   CT Head Wo Contrast  Result Date: 02/28/2021 CLINICAL DATA:  Neck trauma. EXAM: CT HEAD WITHOUT CONTRAST CT CERVICAL SPINE WITHOUT CONTRAST TECHNIQUE: Multidetector CT imaging of the head and cervical spine was performed following the standard protocol without intravenous contrast. Multiplanar CT image reconstructions of the cervical spine were also generated. COMPARISON:  MR cervical spine 02/22/2019, CT head 12/04/2020 FINDINGS: CT HEAD FINDINGS Brain: Stable prominence of the lateral ventricles may be related to central predominant atrophy, although a component of normal pressure/communicating hydrocephalus cannot be excluded. Patchy and confluent areas of decreased attenuation are noted throughout the deep and periventricular white matter of the cerebral hemispheres bilaterally, compatible with chronic microvascular ischemic disease. No evidence of large-territorial acute infarction. No parenchymal hemorrhage. No mass lesion. No extra-axial collection. No mass effect or midline shift. No hydrocephalus. Basilar cisterns are patent. Vascular: No hyperdense vessel. Skull: No acute fracture or focal lesion. Sinuses/Orbits: Paranasal sinuses and mastoid air cells are clear. The orbits are unremarkable. Other: None. CT CERVICAL SPINE FINDINGS Alignment: Normal. Skull base and vertebrae: Anterior fusion at the C3 through C6 levels. No CT findings to suggest surgical hardware complication.  Multilevel degenerative changes. No acute fracture. No aggressive appearing focal osseous lesion or focal pathologic process. Soft tissues and spinal canal: No prevertebral fluid or swelling. No visible canal hematoma. Upper chest: Trace biapical pleural/pulmonary scarring. Other: Severe atherosclerotic plaque of the left carotid arteries in the neck. IMPRESSION: 1. No acute intracranial abnormality, in a patient with cerebral atrophy although a component of normal pressure/communicating hydrocephalus cannot be excluded. 2. No acute displaced fracture or traumatic listhesis of the cervical spine in a patient status post anterior fusion at the C3 through C6 levels. 3. Severe atherosclerotic plaque of the left carotid arteries in the neck. Recommend nonemergent ultrasound carotid arteries. Electronically Signed  By: Iven Finn M.D.   On: 02/28/2021 04:51   CT Cervical Spine Wo Contrast  Result Date: 02/28/2021 CLINICAL DATA:  Neck trauma. EXAM: CT HEAD WITHOUT CONTRAST CT CERVICAL SPINE WITHOUT CONTRAST TECHNIQUE: Multidetector CT imaging of the head and cervical spine was performed following the standard protocol without intravenous contrast. Multiplanar CT image reconstructions of the cervical spine were also generated. COMPARISON:  MR cervical spine 02/22/2019, CT head 12/04/2020 FINDINGS: CT HEAD FINDINGS Brain: Stable prominence of the lateral ventricles may be related to central predominant atrophy, although a component of normal pressure/communicating hydrocephalus cannot be excluded. Patchy and confluent areas of decreased attenuation are noted throughout the deep and periventricular white matter of the cerebral hemispheres bilaterally, compatible with chronic microvascular ischemic disease. No evidence of large-territorial acute infarction. No parenchymal hemorrhage. No mass lesion. No extra-axial collection. No mass effect or midline shift. No hydrocephalus. Basilar cisterns are patent. Vascular:  No hyperdense vessel. Skull: No acute fracture or focal lesion. Sinuses/Orbits: Paranasal sinuses and mastoid air cells are clear. The orbits are unremarkable. Other: None. CT CERVICAL SPINE FINDINGS Alignment: Normal. Skull base and vertebrae: Anterior fusion at the C3 through C6 levels. No CT findings to suggest surgical hardware complication. Multilevel degenerative changes. No acute fracture. No aggressive appearing focal osseous lesion or focal pathologic process. Soft tissues and spinal canal: No prevertebral fluid or swelling. No visible canal hematoma. Upper chest: Trace biapical pleural/pulmonary scarring. Other: Severe atherosclerotic plaque of the left carotid arteries in the neck. IMPRESSION: 1. No acute intracranial abnormality, in a patient with cerebral atrophy although a component of normal pressure/communicating hydrocephalus cannot be excluded. 2. No acute displaced fracture or traumatic listhesis of the cervical spine in a patient status post anterior fusion at the C3 through C6 levels. 3. Severe atherosclerotic plaque of the left carotid arteries in the neck. Recommend nonemergent ultrasound carotid arteries. Electronically Signed   By: Iven Finn M.D.   On: 02/28/2021 04:51   DG Hip Unilat W or Wo Pelvis 2-3 Views Right  Result Date: 02/28/2021 CLINICAL DATA:  Fall. Pt was brought after rolling off his bed and landing on his right hip. Pt unable to extend both legs for imaging. EXAM: DG HIP (WITH OR WITHOUT PELVIS) 2-3V RIGHT COMPARISON:  Xr left hip 05/29/20 FINDINGS: Impacted right femoral neck fracture. No right hip dislocation. Frontal views of the left hip grossly unremarkable. Vascular calcifications. IMPRESSION: Impacted right femoral neck fracture. Electronically Signed   By: Iven Finn M.D.   On: 02/28/2021 04:23    Review of Systems  Musculoskeletal: Positive for joint pain.  Psychiatric/Behavioral: Positive for depression.  All other systems reviewed and are  negative.   Blood pressure 122/61, pulse 67, temperature 98.2 F (36.8 C), temperature source Oral, resp. rate (!) 21, height 6\' 1"  (1.854 m), weight 58 kg, SpO2 100 %. Physical Exam Constitutional:      Appearance: He is well-developed.  HENT:     Head: Normocephalic.  Eyes:     Pupils: Pupils are equal, round, and reactive to light.  Neck:     Thyroid: No thyromegaly.     Vascular: No JVD.     Trachea: No tracheal deviation.  Cardiovascular:     Rate and Rhythm: Normal rate and regular rhythm.  Pulmonary:     Effort: Pulmonary effort is normal. No respiratory distress.     Breath sounds: Normal breath sounds. No wheezing.  Abdominal:     Palpations: Abdomen is soft.  Tenderness: There is no abdominal tenderness. There is no guarding.  Musculoskeletal:     Cervical back: Neck supple.     Right hip: Deformity, tenderness and bony tenderness present. Decreased range of motion. Decreased strength (severe contractures of the LEs).  Lymphadenopathy:     Cervical: No cervical adenopathy.  Skin:    General: Skin is warm and dry.  Neurological:     Mental Status: He is oriented to person, place, and time.     Assessment/Plan: Impacted right femoral neck fracture   Discussed with the patient the need for surgery to repair the bone in the right hip He states that he is good and probably doesn't need to undergo that kind of surgery Dr. Alvan Dame plans on talking with family regarding the appropriateness of doing/not doing surgery  NPO for now until determination made.     Lucille Passy [facility] 02/28/2021, 7:49 AM

## 2021-02-28 NOTE — H&P (Signed)
Date: 02/28/2021               Patient Name:  Brad [name] MRN: [id number]  DOB: [birth year] Age / Sex: 75 y.o., male   PCP: [name], DO         Medical Service: Internal Medicine Teaching Service         Attending Physician: Dr. Evette Doffing, Mallie Mussel, *    First Contact: Dr. Wynetta Emery Pager: [phone]  Second Contact: Dr. Court Joy Pager: [phone]       After Hours (After 5p/  First Contact Pager: [phone]  weekends / holidays): Second Contact Pager: [phone]   Chief Complaint: mechanical fall   History of Present Illness: Brad [name] is a 75 y/o person living with HTN, advanced dementia, and seizure disorder who resides at [facility]. He presented after a mechanical fall out of bed. He was found on his right side and complained of right shoulder and hip pain initially. No reported seizure activity. He is not on any anticoagulation.  On my evaluation, [name] was lying comfortably in bed. Thinks he was trying to roll over in bed when we fell. He no longer complained of any hip pain.   Per SNF report, he is total care, primarily bed-bound but transfers to wheelchair via mechanical lift. Incontinent of bowel and bladder. They have been working with him on feeding, but still requires a lot of assistance. Diet is pureed with thin liquids.   Meds: Verified with SNF  Fluoxetine 40 mg daily Vit D3 400 units daily Acetaminophin 1000 mg q 6 prn  Amlodipine 2.5 mg daily  Lipitor 20 mg daly Depakote 500 mg BID Mirtazapine 30 mg qhs Flomax 0.4 mg qhs Temazepam 7.5 mg qhs prn   Allergies: Allergies as of 02/28/2021  . (No Known Allergies)   Past Medical History:  Diagnosis Date  . Anemia   . Arthritis   . Cancer [facility]) 2017   prostate  . Depression   . Diabetes mellitus without complication ([place])   . History of stomach ulcers   . Hypertension   . Seizures ([place])   . Substance abuse ([place])     Family History:  Family History  Problem Relation Age of Onset  .  Seizures Neg Hx      Social History: resident of SNF. Total care with ADLs secondary to advanced dementia. Sister is HCPOA (ACP tab)  Review of Systems: A complete ROS was negative except as per HPI.   Physical Exam: Blood pressure 108/62, pulse 70, temperature 98.2 F (36.8 C), temperature source Oral, resp. rate (!) 25, height 6\' 1"  (1.854 m), weight 58 kg, SpO2 100 %. General: chronically ill, cachectic gentleman, lying in bed in NAD HEENT: edentulous. No orophpharyngeal erythema or exudates  Neck: supraclavicular wasting CV: RRR. 2+ pulses throughout  Pulm: normal work of breathing on room air; lungs CTA Abd: Scaphoid abdomen. Soft, non-tender, non-distended Ext: warm and well perfused. upper and lower extremities in flexed position, rotated onto left side.  Neuro: demented. Non-focal  Skin: xerosis throughout. Wounds on RLE bandaged.    EKG: personally reviewed my interpretation is significant artifact. NSR without evidence of acute ischemia.   CXR: personally reviewed my interpretation is no evidence of fractures, infiltrates or effusions.   Assessment & Plan by Problem: Active Problems:   Femur neck fracture [facility])  Brad [name] is a 75 y/o person living with HTN, advanced dementia, and seizure disorder who resides at [facility] and presented  after a mechanical fall out of bed.   Impacted right femoral neck fracture -appreciate ortho consult. Plan for surgical management either later today or tomorrow  -NPO for now, pureed diet when indicated  -pain is fortunately minimal for the time being. Can continue tylenol prn for mild pain. Will use low-dose norco for severe pain  -start anticoagulation postoperatively   Seizure disorder -SNF does not suspect seizure activity contributed to his fall  -continue current does of depakote  Advanced dementia with mood disturbance -continue home fluoxetine, mirtazapine and temazepam   Diet: NPO DVT ppx: SCDs CODE: DNR    Dispo: Admit patient to Inpatient with expected length of stay greater than 2 midnights.  SignedDelice Bison, DO 02/28/2021, 11:38 AM  Pager: [phone] After 5pm on weekdays and 1pm on weekends: On Call pager: [phone]

## 2021-02-28 NOTE — Plan of Care (Signed)
  Problem: Education: Goal: Knowledge of General Education information will improve Description: Including pain rating scale, medication(s)/side effects and non-pharmacologic comfort measures Outcome: Progressing   Problem: Pain Managment: Goal: General experience of comfort will improve Outcome: Progressing   Problem: Health Behavior/Discharge Planning: Goal: Ability to manage health-related needs will improve Outcome: Not Progressing   Problem: Activity: Goal: Risk for activity intolerance will decrease Outcome: Not Progressing   Problem: Nutrition: Goal: Adequate nutrition will be maintained Outcome: Not Progressing

## 2021-02-28 NOTE — ED Triage Notes (Signed)
Pt coming from [facility] with EMS after a fall from bed. Pt reports that he was rolling to his other side and that he fell off of bed. Pt states there are no rails on his bed. Pt is c/o pain to bilateral hips, RT leg, and RT arm. Pt does not think he hit his head. Pt AAOx3; unsure of time.

## 2021-02-28 NOTE — ED Provider Notes (Signed)
Signout from Dr. Roxanne Mins.  75 year old male bedbound at nursing facility fall out of bed and broken right hip.  Plan is to admit once labs resulted. Physical Exam  BP 122/61   Pulse 67   Temp 98.2 F (36.8 C) (Oral)   Resp (!) 21   Ht 6\' 1"  (1.854 m)   Wt 58 kg   SpO2 100%   BMI 16.87 kg/m   Physical Exam  ED Course/Procedures     Procedures  MDM  Discussed with internal medicine teaching service who will evaluate the patient for admission.       Hayden Rasmussen, MD 02/28/21 [phone]

## 2021-03-01 DIAGNOSIS — E119 Type 2 diabetes mellitus without complications: Secondary | ICD-10-CM | POA: Diagnosis present

## 2021-03-01 DIAGNOSIS — Z993 Dependence on wheelchair: Secondary | ICD-10-CM | POA: Diagnosis not present

## 2021-03-01 DIAGNOSIS — E785 Hyperlipidemia, unspecified: Secondary | ICD-10-CM | POA: Diagnosis present

## 2021-03-01 DIAGNOSIS — Z79899 Other long term (current) drug therapy: Secondary | ICD-10-CM | POA: Diagnosis not present

## 2021-03-01 DIAGNOSIS — E46 Unspecified protein-calorie malnutrition: Secondary | ICD-10-CM

## 2021-03-01 DIAGNOSIS — M1990 Unspecified osteoarthritis, unspecified site: Secondary | ICD-10-CM | POA: Diagnosis present

## 2021-03-01 DIAGNOSIS — I10 Essential (primary) hypertension: Secondary | ICD-10-CM | POA: Diagnosis present

## 2021-03-01 DIAGNOSIS — G40909 Epilepsy, unspecified, not intractable, without status epilepticus: Secondary | ICD-10-CM | POA: Diagnosis present

## 2021-03-01 DIAGNOSIS — Z8711 Personal history of peptic ulcer disease: Secondary | ICD-10-CM | POA: Diagnosis not present

## 2021-03-01 DIAGNOSIS — F0390 Unspecified dementia without behavioral disturbance: Secondary | ICD-10-CM | POA: Diagnosis present

## 2021-03-01 DIAGNOSIS — W06XXXA Fall from bed, initial encounter: Secondary | ICD-10-CM

## 2021-03-01 DIAGNOSIS — U071 COVID-19: Secondary | ICD-10-CM | POA: Diagnosis present

## 2021-03-01 DIAGNOSIS — S72001A Fracture of unspecified part of neck of right femur, initial encounter for closed fracture: Principal | ICD-10-CM

## 2021-03-01 DIAGNOSIS — E43 Unspecified severe protein-calorie malnutrition: Secondary | ICD-10-CM | POA: Diagnosis present

## 2021-03-01 DIAGNOSIS — Z87891 Personal history of nicotine dependence: Secondary | ICD-10-CM | POA: Diagnosis not present

## 2021-03-01 DIAGNOSIS — S72009A Fracture of unspecified part of neck of unspecified femur, initial encounter for closed fracture: Secondary | ICD-10-CM | POA: Diagnosis present

## 2021-03-01 DIAGNOSIS — Z7401 Bed confinement status: Secondary | ICD-10-CM | POA: Diagnosis not present

## 2021-03-01 DIAGNOSIS — Z8616 Personal history of COVID-19: Secondary | ICD-10-CM | POA: Diagnosis not present

## 2021-03-01 LAB — BASIC METABOLIC PANEL
Anion gap: 6 (ref 5–15)
BUN: 14 mg/dL (ref 8–23)
CO2: 29 mmol/L (ref 22–32)
Calcium: 8.3 mg/dL — ABNORMAL LOW (ref 8.9–10.3)
Chloride: 101 mmol/L (ref 98–111)
Creatinine, Ser: 0.62 mg/dL (ref 0.61–1.24)
GFR, Estimated: >60 mL/min (ref >60)
Glucose, Bld: 60 mg/dL — ABNORMAL LOW (ref 70–99)
Potassium: 4.2 mmol/L (ref 3.5–5.1)
Sodium: 136 mmol/L (ref 135–145)

## 2021-03-01 LAB — CBC
HCT: 26.9 % — ABNORMAL LOW (ref 39.0–52.0)
Hemoglobin: 8.4 g/dL — ABNORMAL LOW (ref 13.0–17.0)
MCH: 29.8 pg (ref 26.0–34.0)
MCHC: 31.2 g/dL (ref 30.0–36.0)
MCV: 95.4 fL (ref 80.0–100.0)
Platelets: 204 10*3/uL (ref 150–400)
RBC: 2.82 MIL/uL — ABNORMAL LOW (ref 4.22–5.81)
RDW: 15.9 % — ABNORMAL HIGH (ref 11.5–15.5)
WBC: 6.2 10*3/uL (ref 4.0–10.5)
nRBC: 0 % (ref 0.0–0.2)

## 2021-03-01 LAB — GLUCOSE, CAPILLARY: Glucose-Capillary: 103 mg/dL — ABNORMAL HIGH (ref 70–99)

## 2021-03-01 LAB — SARS CORONAVIRUS 2 (TAT 6-24 HRS): SARS Coronavirus 2: POSITIVE — AB

## 2021-03-01 MED ORDER — ENSURE ENLIVE PO LIQD
237.0000 mL | Freq: Two times a day (BID) | ORAL | Status: DC
  Administered 2021-03-01 – 2021-03-02 (×2): 237 mL via ORAL

## 2021-03-01 MED ORDER — ORAL CARE MOUTH RINSE
15.0000 mL | Freq: Two times a day (BID) | OROMUCOSAL | Status: DC
  Administered 2021-03-01 – 2021-03-02 (×3): 15 mL via OROMUCOSAL

## 2021-03-01 NOTE — Progress Notes (Addendum)
On admission at 04:55 02/28/2021 patient's SARS CORONAVIRUS (TAT 6-24 HRS) Nasopharyngeal Swab resulted negative. Patient admitted for right hip fracture with patient and patient's sister desiring non-operative management of his condition. Patient discharged from [facility] with plan to return to [facility]. While awaiting transportation back to facility, repeat SARS CORONAVIRUS (TAT 6-24 HRS) Nasopharyngeal Swab collected at 09:44 03/16/1915 per SNF policy resulted as positive. Patient is currently asymptomatic for COVID-19 infection. Our team has discussed this positive laboratory result with patient's RN and Infectious Control. Patient will be transferred to 5W for further supervision and management.  ADDENDUM 1: Discussed laboratory result with patient's sister, Chilton Greathouse. Patient's sister reports that patient has received two COVID-19 vaccinations and had symptomatic COVID-19 infection around the end of January 2022 or beginning of February 2022 while residing at [facility]. He was reportedly placed in isolation for two weeks with completion of isolation approximately one month ago. Due to this report, patient may continue to test positive for SARS-CORONAVIRUS for upwards of approximately 90 days. Patient's positive test result is highly likely secondary to his known recent COVID-19 infection which has resolved. Currently on hold with [facility] to confirm Ms. Yellock's report.  ADDENDUM 2: Loie at [facility] confirmed sister's report that patient has had COVID-19 infection within the past month. Per [facility]' policy, patient should not have been tested for at least 90-days following his recent infection, however [facility] did not provide our team or the Emergency Department with report or documentation of patient's recent infection. Loie is discussing patient's case with nursing director so that patient may return to [facility].  ADDENDUM 3: Reed Breech, LCSWA spoke with [facility] and  reports that we will receive a call from nursing director on Wednesday, March 16th 2022 to further discuss this case. Patient will not be able to discharge this evening.

## 2021-03-01 NOTE — Care Management CC44 (Signed)
Condition Code 44 Documentation Completed  Patient Details  Name: Brad [name] MRN: [id number] Date of Birth: [birth year]   Condition Code 44 given:   yes Patient signature on Condition Code 44 notice:    yes Documentation of 2 MD's agreement:    yes Code 44 added to claim:   yes    Sharin Mons, RN 03/01/2021, 1:34 PM

## 2021-03-01 NOTE — TOC Transition Note (Deleted)
Transition of Care [facility]) - CM/SW Discharge Note   Patient Details  Name: Brad [name] MRN: [id number] Date of Birth: 01/26/1946  Transition of Care [facility]) CM/SW Contact:  Gabrielle Dare Phone Number: 03/01/2021, 12:56 PM   Clinical Narrative:    Patient will Discharge To:[facility] Anticipated DC Date:03/01/21 Family Notified:yes, sister Chilton Greathouse, [phone] Transport KX:FGHW   Per MD patient ready for DC to [facility] . RN, patient, patient's family, and facility notified of DC. Assessment, Fl2/Pasrr, and Discharge Summary sent to facility. RN given number for report [phone] ask for [facility], Room # 112B(). DC packet on chart. Ambulance transport requested for patient.   CSW signing off.  Reed Breech LCSWA [phone]     Final next level of care: Skilled Nursing Facility Barriers to Discharge: No Barriers Identified   Patient Goals and CMS Choice        Discharge Placement              Patient chooses bed at:  ([facility]) Patient to be transferred to facility by: [facility] Name of family member notified: Alphonsa Gin Patient and family notified of of transfer: 03/01/21  Discharge Plan and Services                                     Social Determinants of Health (SDOH) Interventions     Readmission Risk Interventions No flowsheet data found.

## 2021-03-01 NOTE — Discharge Summary (Signed)
Name: Brad [name] MRN: [id number] DOB: [birth year] 75 y.o. PCP: [name], DO  Date of Admission: 02/28/2021  3:29 AM Date of Discharge: 03/01/2021 Attending Physician: Axel Filler, *  Discharge Diagnosis: 1. Active Problems:   Femur neck fracture [facility])  Discharge Medications: Allergies as of 03/01/2021   No Known Allergies     Medication List    STOP taking these medications   temazepam 7.5 MG capsule Commonly known as: RESTORIL     TAKE these medications   acetaminophen 500 MG tablet Commonly known as: TYLENOL Take 1,000 mg by mouth every 6 (six) hours as needed for moderate pain.   amLODipine 2.5 MG tablet Commonly known as: NORVASC Take 1 tablet (2.5 mg total) by mouth daily.   atorvastatin 20 MG tablet Commonly known as: LIPITOR Take 1 tablet (20 mg total) by mouth daily. What changed: when to take this   divalproex 125 MG capsule Commonly known as: DEPAKOTE SPRINKLE Take 4 capsules (500 mg total) by mouth every 12 (twelve) hours.   Ensure Take 237 mLs by mouth in the morning, at noon, and at bedtime.   FLUoxetine 20 MG capsule Commonly known as: PROZAC Take 2 capsules (40 mg total) by mouth daily.   mirtazapine 30 MG tablet Commonly known as: REMERON Take 30 mg by mouth at bedtime.   multivitamin with minerals Tabs tablet Take 1 tablet by mouth daily.   OVER THE COUNTER MEDICATION Take 30 mLs by mouth daily. For 30 days Liquid protein   OVER THE COUNTER MEDICATION Take 237 mLs by mouth See admin instructions. Two times daily between meals during med pass - 0900 and 1700 Ensure or Boost   OVER THE COUNTER MEDICATION Take 30 mLs by mouth 2 (two) times daily. For weight loss and wound healing Pro heal   tamsulosin 0.4 MG Caps capsule Commonly known as: FLOMAX Take 0.4 mg by mouth at bedtime.   vitamin C 500 MG tablet Commonly known as: ASCORBIC ACID Take 500 mg by mouth 2 (two) times daily. For 30 days   Vitamin D2 50  MCG (2000 UT) Tabs Take 2,000 Units by mouth daily. For 30 days       Disposition and follow-up:   Brad [name] was discharged from [facility] in [place] condition.  At the hospital follow up visit please address:  1.  Right impacted femoral neck fracture: Patient presented following mechanical fall out of bed found to have right hip fracture. Patient and patient's family discussed findings with orthopedics with desire to NOT pursue surgical management of this fracture with the following recommendations (nonweight bearing, careful transfers, pain control and follow-up in outpatient setting). Patient discharged with acetaminophen 1000mg  Q6H PRN, please determine if pain adequately controlled on this regimen. Confirm patient has planned orthopedics follow-up. Patient's temazepam discontinued to reduce likelihood of recurrent falls. Adjust physical and occupational therapy regimen given his new fracture.  2.  Labs / imaging needed at time of follow-up: None  3.  Pending labs/ test needing follow-up: None  Follow-up Appointments:  Follow-up Information    [name], DO. Schedule an appointment as soon as possible for a visit in 1 week(s).   Specialty: General Practice Contact information: [place] [address] [phone]        Paralee Cancel, MD. Schedule an appointment as soon as possible for a visit in 1 week(s).   Specialty: Orthopedic Surgery Contact information: [address] [place] [address]  [place] by problem list: 1. Right impacted femoral neck fracture: Patient presented following mechanical fall out of bed found to have right hip fracture. Patient and patient's family discussed findings with orthopedics with desire to NOT pursue surgical management of this fracture with the following recommendations (nonweight bearing, careful transfers, pain control and follow-up in  outpatient setting). Patient discharged with acetaminophen 1000mg  Q6H PRN and plan to follow-up closely with orthopedics. Patient's temazepam discontinued to reduce likelihood of recurrent falls. Adjust physical and occupational therapy regimen given his new fracture.  Pertinent Labs, Studies, and Procedures:  CBC Latest Ref Rng & Units 03/01/2021 02/28/2021 01/28/2021  WBC 4.0 - 10.5 K/uL 6.2 8.7 6.5  Hemoglobin 13.0 - 17.0 g/dL 8.4(L) 11.1(L) 8.5(L)  Hematocrit 39.0 - 52.0 % 26.9(L) 36.5(L) 28.4(L)  Platelets 150 - 400 K/uL 204 235 278   CMP Latest Ref Rng & Units 03/01/2021 02/28/2021 01/28/2021  Glucose 70 - 99 mg/dL 60(L) 74 92  BUN 8 - 23 mg/dL 14 6(L) 6(L)  Creatinine 0.61 - 1.24 mg/dL 0.62 0.59(L) 0.51(L)  Sodium 135 - 145 mmol/L 136 139 137  Potassium 3.5 - 5.1 mmol/L 4.2 4.1 3.4(L)  Chloride 98 - 111 mmol/L 101 100 98  CO2 22 - 32 mmol/L 29 32 29  Calcium 8.9 - 10.3 mg/dL 8.3(L) 9.0 7.9(L)  Total Protein 6.5 - 8.1 g/dL - - -  Total Bilirubin 0.3 - 1.2 mg/dL - - -  Alkaline Phos 38 - 126 U/L - - -  AST 15 - 41 U/L - - -  ALT 0 - 44 U/L - - -   DG Chest 1 View  Result Date: 02/28/2021 CLINICAL DATA:  75 year old male preoperative study right femoral neck fracture. EXAM: CHEST  1 VIEW COMPARISON:  Portable chest 01/28/2021 and earlier. FINDINGS: Portable AP supine view at 0518 hours. Lung volumes and mediastinal contours remain normal. Allowing for portable technique the lungs are clear. Visualized tracheal air column is within normal limits. Prior cervical ACDF. Stable visualized osseous structures. Negative visible bowel gas pattern. IMPRESSION: Negative.  No acute cardiopulmonary abnormality. Electronically Signed   By: Genevie Ann M.D.   On: 02/28/2021 05:29   CT Head Wo Contrast  Result Date: 02/28/2021 CLINICAL DATA:  Neck trauma. EXAM: CT HEAD WITHOUT CONTRAST CT CERVICAL SPINE WITHOUT CONTRAST TECHNIQUE: Multidetector CT imaging of the head and cervical spine was performed  following the standard protocol without intravenous contrast. Multiplanar CT image reconstructions of the cervical spine were also generated. COMPARISON:  MR cervical spine 02/22/2019, CT head 12/04/2020 FINDINGS: CT HEAD FINDINGS Brain: Stable prominence of the lateral ventricles may be related to central predominant atrophy, although a component of normal pressure/communicating hydrocephalus cannot be excluded. Patchy and confluent areas of decreased attenuation are noted throughout the deep and periventricular white matter of the cerebral hemispheres bilaterally, compatible with chronic microvascular ischemic disease. No evidence of large-territorial acute infarction. No parenchymal hemorrhage. No mass lesion. No extra-axial collection. No mass effect or midline shift. No hydrocephalus. Basilar cisterns are patent. Vascular: No hyperdense vessel. Skull: No acute fracture or focal lesion. Sinuses/Orbits: Paranasal sinuses and mastoid air cells are clear. The orbits are unremarkable. Other: None. CT CERVICAL SPINE FINDINGS Alignment: Normal. Skull base and vertebrae: Anterior fusion at the C3 through C6 levels. No CT findings to suggest surgical hardware complication. Multilevel degenerative changes. No acute fracture. No aggressive appearing focal osseous lesion or focal  pathologic process. Soft tissues and spinal canal: No prevertebral fluid or swelling. No visible canal hematoma. Upper chest: Trace biapical pleural/pulmonary scarring. Other: Severe atherosclerotic plaque of the left carotid arteries in the neck. IMPRESSION: 1. No acute intracranial abnormality, in a patient with cerebral atrophy although a component of normal pressure/communicating hydrocephalus cannot be excluded. 2. No acute displaced fracture or traumatic listhesis of the cervical spine in a patient status post anterior fusion at the C3 through C6 levels. 3. Severe atherosclerotic plaque of the left carotid arteries in the neck. Recommend  nonemergent ultrasound carotid arteries. Electronically Signed   By: Iven Finn M.D.   On: 02/28/2021 04:51   CT Cervical Spine Wo Contrast  Result Date: 02/28/2021 CLINICAL DATA:  Neck trauma. EXAM: CT HEAD WITHOUT CONTRAST CT CERVICAL SPINE WITHOUT CONTRAST TECHNIQUE: Multidetector CT imaging of the head and cervical spine was performed following the standard protocol without intravenous contrast. Multiplanar CT image reconstructions of the cervical spine were also generated. COMPARISON:  MR cervical spine 02/22/2019, CT head 12/04/2020 FINDINGS: CT HEAD FINDINGS Brain: Stable prominence of the lateral ventricles may be related to central predominant atrophy, although a component of normal pressure/communicating hydrocephalus cannot be excluded. Patchy and confluent areas of decreased attenuation are noted throughout the deep and periventricular white matter of the cerebral hemispheres bilaterally, compatible with chronic microvascular ischemic disease. No evidence of large-territorial acute infarction. No parenchymal hemorrhage. No mass lesion. No extra-axial collection. No mass effect or midline shift. No hydrocephalus. Basilar cisterns are patent. Vascular: No hyperdense vessel. Skull: No acute fracture or focal lesion. Sinuses/Orbits: Paranasal sinuses and mastoid air cells are clear. The orbits are unremarkable. Other: None. CT CERVICAL SPINE FINDINGS Alignment: Normal. Skull base and vertebrae: Anterior fusion at the C3 through C6 levels. No CT findings to suggest surgical hardware complication. Multilevel degenerative changes. No acute fracture. No aggressive appearing focal osseous lesion or focal pathologic process. Soft tissues and spinal canal: No prevertebral fluid or swelling. No visible canal hematoma. Upper chest: Trace biapical pleural/pulmonary scarring. Other: Severe atherosclerotic plaque of the left carotid arteries in the neck. IMPRESSION: 1. No acute intracranial abnormality, in a  patient with cerebral atrophy although a component of normal pressure/communicating hydrocephalus cannot be excluded. 2. No acute displaced fracture or traumatic listhesis of the cervical spine in a patient status post anterior fusion at the C3 through C6 levels. 3. Severe atherosclerotic plaque of the left carotid arteries in the neck. Recommend nonemergent ultrasound carotid arteries. Electronically Signed   By: Iven Finn M.D.   On: 02/28/2021 04:51   DG Hip Unilat W or Wo Pelvis 2-3 Views Right  Result Date: 02/28/2021 CLINICAL DATA:  Fall. Pt was brought after rolling off his bed and landing on his right hip. Pt unable to extend both legs for imaging. EXAM: DG HIP (WITH OR WITHOUT PELVIS) 2-3V RIGHT COMPARISON:  Xr left hip 05/29/20 FINDINGS: Impacted right femoral neck fracture. No right hip dislocation. Frontal views of the left hip grossly unremarkable. Vascular calcifications. IMPRESSION: Impacted right femoral neck fracture. Electronically Signed   By: Iven Finn M.D.   On: 02/28/2021 04:23   Discharge Instructions: Discharge Instructions    Call MD for:  difficulty breathing, headache or visual disturbances   Complete by: As directed    Call MD for:  persistant dizziness or light-headedness   Complete by: As directed    Call MD for:  persistant nausea and vomiting   Complete by: As directed    Call MD  for:  severe uncontrolled pain   Complete by: As directed    Call MD for:  temperature >100.4   Complete by: As directed    Discharge instructions   Complete by: As directed    Brad [name],  It was a pleasure meeting you during your recent hospitalization.  You fell out of bed and fractured your right hip. Based on your conversations with family and orthopedics, you have chosen to NOT pursue surgical management of this condition. We respect your decision. We have prescribed acetaminophen every six hours as needed for pain. We also strongly recommend discontinuing temazepam as  this medication can increase your risk of falls.   Sincerely, Dr. Paulla Dolly, MD   Increase activity slowly   Complete by: As directed    No wound care   Complete by: As directed       Signed: Cato Mulligan, MD 03/01/2021, 11:49 AM   Pager: [phone]

## 2021-03-01 NOTE — Progress Notes (Signed)
Initial Nutrition Assessment  DOCUMENTATION CODES:   Severe malnutrition in context of chronic illness  INTERVENTION:   -Magic Cup TID with meals, each supplement provides 290 kcal, 9 grams protein  -Ensure Enlive TID, each supplement provides 350 kcal, 20 grams protein  -Continue MVI with minerals daily  -Assistance with feeding  NUTRITION DIAGNOSIS:   Severe Malnutrition related to chronic illness (advanced dementia and dysphagia) as evidenced by severe fat depletion,severe muscle depletion,percent weight loss.  GOAL:   Patient will meet greater than or equal to 90% of their needs  MONITOR:   PO intake,Supplement acceptance,Skin,I & O's,Labs,Weight trends  REASON FOR ASSESSMENT:   Consult Assessment of nutrition requirement/status,Hip fracture protocol  ASSESSMENT:   11 YOM admitted for femur neck fracture. PMH of anemia, arthritis, prostate ca, depression, DM w/o comp, HTN, substance abuse, stomach ulcers, advanced dementia, seizure disorder. Pt resides at [facility] and is bedbound at baseline.  Per last RD note, pt's living will states he does not wish to have a feeding tube.   Per chart, no meal documentation noted. Per MD note, pt is on a pureed diet with thin liquids at his SNF. Unable to get a full nutrition history as pt was in pain and also has dementia, therefore not able to provide extensive answers to questions. Pt reports that he has had a good appetite, but reports he only eats small portions of his meals due to not being able to swallow well. Pt reports that he eats 3 meals a day and snacks at his SNF, he reports that he eats what they cook him. Poor dentition was noted during NFPE, however pt denied any chewing difficulties. Pt reports that he currently does not consume any oral nutrition supplements at home, but has had them before and would be willing to drink them while admitted.    Pt reports a UBW of 148#. Pt reports that he feels he has lost  weight, but was not able to provide a timeframe of weight loss. Pt's weights reviewed and show a weight loss of 21% since 05/29/20 which is significant for timeframe.  Current weight: 127.87#   Meds reviewed: Vitamin D3 (daily), Remeron (daily), MVI (daily), Senekot (BID)  Labs reviewed: CBG (87-106)  I&O's reviewed: -465 L since admit  UOP reviewed: 700 mL x 24 hrs  NUTRITION - FOCUSED PHYSICAL EXAM:  Flowsheet Row Most Recent Value  Orbital Region Severe depletion  Upper Arm Region Severe depletion  Thoracic and Lumbar Region Severe depletion  Buccal Region Severe depletion  Temple Region Moderate depletion  Clavicle Bone Region Severe depletion  Clavicle and Acromion Bone Region Severe depletion  Scapular Bone Region Unable to assess  Dorsal Hand Severe depletion  Patellar Region Unable to assess  Anterior Thigh Region Unable to assess  Posterior Calf Region Unable to assess  Edema (RD Assessment) None  Hair Reviewed  Eyes Reviewed  Mouth Reviewed  [poor dentition]  Skin Reviewed  Nails Reviewed       Diet Order:   Diet Order            DIET - DYS 1 Room service appropriate? Yes; Fluid consistency: Thin  Diet effective now                 EDUCATION NEEDS:   No education needs have been identified at this time  Skin:  Skin Assessment: Skin Integrity Issues: Skin Integrity Issues:: Other (Comment) Other: unclassified pressure injury R heel, L thigh  Last BM:  3/15 (  type 6)  Height:   Ht Readings from Last 1 Encounters:  02/28/21 6\' 1"  (1.854 m)    Weight:   Wt Readings from Last 1 Encounters:  02/28/21 58 kg    Ideal Body Weight:  83.6 kg  BMI:  Body mass index is 16.87 kg/m.  Estimated Nutritional Needs:   Kcal:  2100-2300  Protein:  115-130 g  Fluid:  >/=2.1 L    Salvadore Oxford, Dietetic Intern 03/01/2021 2:09 PM

## 2021-03-01 NOTE — Plan of Care (Signed)

## 2021-03-01 NOTE — Progress Notes (Signed)
Subjective:   Yesterday afternoon, patient's sister spoke with orthopedics regarding patient's condition with plan for non-operative management of his right femoral neck fracture.  Overnight, no acute events.  This morning, patient says he feels "pretty good." We discussed plan for non-operative management. Patient repeats "whatever they think." He says he is hungry and his breakfast has just arrived.   Objective:  Vital signs in last 24 hours: Vitals:   02/28/21 1615 02/28/21 1830 02/28/21 2010 03/01/21 0443  BP: (!) 120/55 (!) 155/57 (!) 135/56 117/64  Pulse: 70 70 65 71  Resp: 20 20 18 15   Temp:  97.8 F (36.6 C) 98 F (36.7 C) 98.1 F (36.7 C)  TempSrc:  Oral Oral Oral  SpO2: 99% 100% 99% 100%  Weight:      Height:      On room air  Intake/Output Summary (Last 24 hours) at 03/01/2021 0647 Last data filed at 03/01/2021 0100 Gross per 24 hour  Intake 685 ml  Output 300 ml  Net 385 ml   Filed Weights   02/28/21 0345  Weight: 58 kg  Physical Exam Constitutional:      General: He is not in acute distress.    Appearance: He is ill-appearing.     Comments: Cachetic, frail, elderly man laying on right side in hospital bed with pillow between knees, in no acute distress  Cardiovascular:     Rate and Rhythm: Normal rate and regular rhythm.     Pulses: Normal pulses.     Heart sounds: Normal heart sounds.  Pulmonary:     Effort: Pulmonary effort is normal. No respiratory distress.  Abdominal:     General: Abdomen is flat. Bowel sounds are normal.     Palpations: Abdomen is soft.     Tenderness: There is no abdominal tenderness.  Musculoskeletal:     Right lower leg: No edema.     Left lower leg: No edema.  Neurological:     General: No focal deficit present.     Mental Status: Mental status is at baseline.  Psychiatric:        Mood and Affect: Mood normal.        Behavior: Behavior normal.    Labs in last 24 hours: CBC Latest Ref Rng & Units 03/01/2021  02/28/2021 01/28/2021  WBC 4.0 - 10.5 K/uL 6.2 8.7 6.5  Hemoglobin 13.0 - 17.0 g/dL 8.4(L) 11.1(L) 8.5(L)  Hematocrit 39.0 - 52.0 % 26.9(L) 36.5(L) 28.4(L)  Platelets 150 - 400 K/uL 204 235 278   BMP Latest Ref Rng & Units 03/01/2021 02/28/2021 01/28/2021  Glucose 70 - 99 mg/dL 60(L) 74 92  BUN 8 - 23 mg/dL 14 6(L) 6(L)  Creatinine 0.61 - 1.24 mg/dL 0.62 0.59(L) 0.51(L)  BUN/Creat Ratio 10 - 24 - - -  Sodium 135 - 145 mmol/L 136 139 137  Potassium 3.5 - 5.1 mmol/L 4.2 4.1 3.4(L)  Chloride 98 - 111 mmol/L 101 100 98  CO2 22 - 32 mmol/L 29 32 29  Calcium 8.9 - 10.3 mg/dL 8.3(L) 9.0 7.9(L)   Imaging in last 24 hours: None  Assessment/Plan:  Active Problems:   Femur neck fracture (HCC)  Brad [name] is a 75 year old man with past medical history significant for bedbound state residing at [facility], advanced dementia, seizure disorder and HTN who presented to [facility] on 02/28/21 following a mechanical fall out of bed found to have an impacted right femoral neck fracture.  #Right femoral neck fracture status post mechanical fall, active  Patient admitted for surgical management of his impacted right femoral neck fracture. Orthopedics discussion with patient and patient's sister with determination that patient does not desire surgical management of his fracture at this time. Plan per orthopedics is to continue with nonweight bearing, careful transfers and pain control. Patient is chronically bed bound and uses mechanical lift for transfer to wheelchair. Patient is not in pain on evaluation this morning. -Discharge with acetaminophen 1,000mg  Q6H PRN for pain control -Discontinue Norco (new medication this hospitalization) -Discontinue temazepam (home medication) -Return to SNF ([facility]  #Malnutrition, active -Appreciate RD recommendations -Full assistance with meals -Pureed diet (Dysphagia 1 ordered)  Prior to Admission Living Arrangement: SNF Anticipated Discharge Location:  SNF Barriers to Discharge: None Dispo: Anticipated discharge today pending SNF COVID test  Cato Mulligan, MD 03/01/2021, 6:44 AM Pager: [phone] After 5pm on weekdays and 1pm on weekends: On Call pager [phone]

## 2021-03-01 NOTE — Progress Notes (Signed)
Orthopedic Tech Progress Note Patient Details:  Brad [name] [birth year] [id number]  Patient ID: Brad [name], male   DOB: [birth year], 75 y.o.   MRN: [id number]   Overhead frame ordered and not appropriate.  Pt from long term care and bed bound (see OT note).  Ortho tech will not bring frame.   Thanks,  Verdene Lennert, PT, DPT  Acute Rehabilitation [phone] pager #[phone] office       Brad [name] 03/01/2021, 9:45 PM

## 2021-03-01 NOTE — TOC Progression Note (Signed)
Transition of Care [facility]) - Progression Note    Patient Details  Name: Brad [name] MRN: [id number] Date of Birth: [birth year]  Transition of Care [facility]) CM/SW [place], [place] Phone Number: 03/01/2021, 4:17 PM  Clinical Narrative:    RN [place] contacted CSW concerning pt's covid status.  Pt has tested covid positive.  CSW updated [facility].  Pt will not be able to return to [facility] until after 10 days with no symptoms. CSW left vm on pt's sister phone to update her on pt's status.  TOC will continue to assist with disposition planning.     Barriers to Discharge: No Barriers Identified  Expected Discharge Plan and Services           Expected Discharge Date: 03/01/21                                     Social Determinants of Health (SDOH) Interventions    Readmission Risk Interventions No flowsheet data found.

## 2021-03-01 NOTE — Progress Notes (Signed)
ANTICOAGULATION CONSULT NOTE - Initial Consult  Pharmacy Consult for enoxaparin Indication: VTE prophylaxis  No Known Allergies  Patient Measurements: Height: 6\' 1"  (185.4 cm) Weight: 58 kg (127 lb 13.9 oz) IBW/kg (Calculated) : 79.9  Vital Signs: Temp: 98.3 F (36.8 C) (03/15 0734) Temp Source: Oral (03/15 0734) BP: 96/56 (03/15 0734) Pulse Rate: 77 (03/15 0734)  Labs: Recent Labs    02/28/21 0610 03/01/21 0336  HGB 11.1* 8.4*  HCT 36.5* 26.9*  PLT 235 204  LABPROT 16.3*  --   INR 1.4*  --   CREATININE 0.59* 0.62    Estimated Creatinine Clearance: 67.5 mL/min (by C-G formula based on SCr of 0.62 mg/dL).   Medical History: Past Medical History:  Diagnosis Date  . Anemia   . Arthritis   . Cancer [facility]) 2017   prostate  . Depression   . Diabetes mellitus without complication ([place])   . History of stomach ulcers   . Hypertension   . Seizures ([facility])   . Substance abuse [facility])      Assessment: 75 yo M admitted with fall and femoral neck fx with no plans for surgery. Pharmacy consulted for enoxaparin for VTE prophylaxis.   ClCr wnl, 58kg. H/H and plt dropped but no signs of bleeding. Received fluids, likely dilution.    Goal of Therapy:  Monitor platelets by anticoagulation protocol: Yes   Plan:  Enoxaparin 40mg  daily Monitor for renal function, signs/symptoms of bleeding    Benetta Spar, PharmD, BCPS, BCCP Clinical Pharmacist  Please check AMION for all [place] phone numbers After 10:00 PM, call [place] [phone]

## 2021-03-01 NOTE — Care Management Obs Status (Signed)
[place] NOTIFICATION   Patient Details  Name: Brad [name] MRN: [id number] Date of Birth: 1946/11/02   Medicare Observation Status Notification Given:  Yes    Sharin Mons, RN 03/01/2021, 1:34 PM

## 2021-03-01 NOTE — Progress Notes (Addendum)
OT Cancellation Note  Patient Details Name: Brad [name] MRN: [id number] DOB: 28-Jul-1946   Cancelled Treatment:    Reason Eval/Treat Not Completed: OT orders cancelled. Noted pt from long term care, bed bound, total care. OT will sign off.    Brad [name] 03/01/2021, 3:52 PM

## 2021-03-01 NOTE — Discharge Instructions (Signed)
Mr. Rivet,  It was a pleasure meeting you during your recent hospitalization.  You fell out of bed and fractured your right hip. Based on your conversations with family and orthopedics, you have chosen to NOT pursue surgical management of this condition. We respect your decision. We have prescribed acetaminophen every six hours as needed for pain. We also strongly recommend discontinuing temazepam as this medication can increase your risk of falls.   Sincerely, Dr. Paulla Dolly, MD

## 2021-03-01 NOTE — Progress Notes (Addendum)
PT Cancellation Note  Patient Details Name: Brad [name] MRN: [id number] DOB: 1946/01/10   Cancelled Treatment:      PT orders cancelled.  Noted pt from long term care, bed bound, total care. Will sign off.  Abran Richard, PT Acute Rehab Services Pager [phone] [facility] [place] 03/01/2021, 11:33 AM

## 2021-03-02 MED ORDER — ACETAMINOPHEN 500 MG PO TABS: 1000.0000 mg | ORAL_TABLET | Freq: Three times a day (TID) | ORAL | Status: DC

## 2021-03-02 NOTE — TOC Transition Note (Signed)
Transition of Care [facility]) - CM/SW Discharge Note   Patient Details  Name: Brad [name] MRN: [id number] Date of Birth: 1946/09/26  Transition of Care [facility]) CM/SW Contact:  Gabrielle Dare Phone Number: 03/02/2021, 10:04 AM   Clinical Narrative:    Patient will Discharge To: [place] Anticipated DC Date:03/02/21 Family Notified:yes, Chilton Greathouse, [phone] Transport KB:TCYE   Per MD patient ready for DC to [facility] . RN, patient, patient's family, and facility notified of DC. Assessment, Fl2/Pasrr, and Discharge Summary sent to facility. RN given number for report [phone] as for [facility] on [employer], Room # 112B). DC packet on chart. Ambulance transport requested for patient for 11:00am  CSW signing off.  Reed Breech [facility] [phone]     Final next level of care: Skilled Nursing Facility Barriers to Discharge: No Barriers Identified   Patient Goals and CMS Choice        Discharge Placement              Patient chooses bed at: [facility] Patient to be transferred to facility by: [facility] Name of family member notified: Chilton Greathouse, sister Patient and family notified of of transfer: 03/02/21  Discharge Plan and Services                                     Social Determinants of Health (SDOH) Interventions     Readmission Risk Interventions No flowsheet data found.

## 2021-03-02 NOTE — Progress Notes (Signed)
Report was called to [facility] at [employer]. [facility] has been called for transport.

## 2021-03-02 NOTE — TOC CAGE-AID Note (Signed)
Transition of Care [facility]) - CAGE-AID Screening   Patient Details  Name: Brad [name] MRN: [id number] Date of Birth: [birth year]  Clinical Narrative: Patient unable to participate in screening due to advance dementia and limited orientation.    CAGE-AID Screening: Substance Abuse Screening unable to be completed due to: : Patient unable to participate

## 2021-07-17 ENCOUNTER — Emergency Department (HOSPITAL_COMMUNITY): Payer: Medicare Other

## 2021-07-17 ENCOUNTER — Inpatient Hospital Stay (HOSPITAL_COMMUNITY)
Admission: EM | Admit: 2021-07-17 | Discharge: 2021-07-28 | DRG: 100 | Disposition: A | Discharge status: Skilled Nursing Facility | Payer: Medicare Other | Attending: Internal Medicine | Admitting: Internal Medicine

## 2021-07-17 DIAGNOSIS — E038 Other specified hypothyroidism: Secondary | ICD-10-CM | POA: Diagnosis present

## 2021-07-17 DIAGNOSIS — F419 Anxiety disorder, unspecified: Secondary | ICD-10-CM | POA: Diagnosis present

## 2021-07-17 DIAGNOSIS — Z8711 Personal history of peptic ulcer disease: Secondary | ICD-10-CM

## 2021-07-17 DIAGNOSIS — D72829 Elevated white blood cell count, unspecified: Secondary | ICD-10-CM | POA: Diagnosis present

## 2021-07-17 DIAGNOSIS — E43 Unspecified severe protein-calorie malnutrition: Secondary | ICD-10-CM | POA: Diagnosis present

## 2021-07-17 DIAGNOSIS — G894 Chronic pain syndrome: Secondary | ICD-10-CM | POA: Diagnosis present

## 2021-07-17 DIAGNOSIS — Z8546 Personal history of malignant neoplasm of prostate: Secondary | ICD-10-CM

## 2021-07-17 DIAGNOSIS — F0390 Unspecified dementia without behavioral disturbance: Secondary | ICD-10-CM | POA: Diagnosis present

## 2021-07-17 DIAGNOSIS — D509 Iron deficiency anemia, unspecified: Secondary | ICD-10-CM | POA: Diagnosis present

## 2021-07-17 DIAGNOSIS — Z66 Do not resuscitate: Secondary | ICD-10-CM | POA: Diagnosis present

## 2021-07-17 DIAGNOSIS — G9341 Metabolic encephalopathy: Secondary | ICD-10-CM | POA: Diagnosis present

## 2021-07-17 DIAGNOSIS — E119 Type 2 diabetes mellitus without complications: Secondary | ICD-10-CM | POA: Diagnosis present

## 2021-07-17 DIAGNOSIS — I10 Essential (primary) hypertension: Secondary | ICD-10-CM | POA: Diagnosis present

## 2021-07-17 DIAGNOSIS — Z20822 Contact with and (suspected) exposure to covid-19: Secondary | ICD-10-CM | POA: Diagnosis present

## 2021-07-17 DIAGNOSIS — E876 Hypokalemia: Secondary | ICD-10-CM | POA: Diagnosis present

## 2021-07-17 DIAGNOSIS — G40901 Epilepsy, unspecified, not intractable, with status epilepticus: Secondary | ICD-10-CM | POA: Diagnosis present

## 2021-07-17 DIAGNOSIS — E7220 Disorder of urea cycle metabolism, unspecified: Secondary | ICD-10-CM | POA: Diagnosis present

## 2021-07-17 DIAGNOSIS — Z79899 Other long term (current) drug therapy: Secondary | ICD-10-CM

## 2021-07-17 DIAGNOSIS — M1990 Unspecified osteoarthritis, unspecified site: Secondary | ICD-10-CM | POA: Diagnosis present

## 2021-07-17 DIAGNOSIS — G40919 Epilepsy, unspecified, intractable, without status epilepticus: Secondary | ICD-10-CM

## 2021-07-17 DIAGNOSIS — R627 Adult failure to thrive: Secondary | ICD-10-CM | POA: Diagnosis present

## 2021-07-17 DIAGNOSIS — L8990 Pressure ulcer of unspecified site, unspecified stage: Secondary | ICD-10-CM | POA: Diagnosis present

## 2021-07-17 DIAGNOSIS — Z7189 Other specified counseling: Secondary | ICD-10-CM | POA: Diagnosis not present

## 2021-07-17 DIAGNOSIS — Z87891 Personal history of nicotine dependence: Secondary | ICD-10-CM

## 2021-07-17 DIAGNOSIS — R64 Cachexia: Secondary | ICD-10-CM | POA: Diagnosis present

## 2021-07-17 DIAGNOSIS — E871 Hypo-osmolality and hyponatremia: Secondary | ICD-10-CM | POA: Diagnosis present

## 2021-07-17 DIAGNOSIS — R569 Unspecified convulsions: Secondary | ICD-10-CM | POA: Diagnosis present

## 2021-07-17 DIAGNOSIS — Z681 Body mass index (BMI) 19 or less, adult: Secondary | ICD-10-CM

## 2021-07-17 DIAGNOSIS — B370 Candidal stomatitis: Secondary | ICD-10-CM | POA: Diagnosis present

## 2021-07-17 DIAGNOSIS — F32A Depression, unspecified: Secondary | ICD-10-CM | POA: Diagnosis present

## 2021-07-17 DIAGNOSIS — Z9181 History of falling: Secondary | ICD-10-CM

## 2021-07-17 DIAGNOSIS — Z7401 Bed confinement status: Secondary | ICD-10-CM | POA: Diagnosis not present

## 2021-07-17 DIAGNOSIS — Z515 Encounter for palliative care: Secondary | ICD-10-CM | POA: Diagnosis not present

## 2021-07-17 DIAGNOSIS — G8250 Quadriplegia, unspecified: Secondary | ICD-10-CM | POA: Diagnosis present

## 2021-07-17 LAB — URINALYSIS, ROUTINE W REFLEX MICROSCOPIC
Bilirubin Urine: NEGATIVE
Glucose, UA: NEGATIVE mg/dL
Hgb urine dipstick: NEGATIVE
Ketones, ur: NEGATIVE mg/dL
Leukocytes,Ua: NEGATIVE
Nitrite: NEGATIVE
Protein, ur: NEGATIVE mg/dL
Specific Gravity, Urine: 1.015 (ref 1.005–1.030)
pH: 8 (ref 5.0–8.0)

## 2021-07-17 LAB — COMPREHENSIVE METABOLIC PANEL
ALT: 14 U/L (ref 0–44)
AST: 20 U/L (ref 15–41)
Albumin: 2.6 g/dL — ABNORMAL LOW (ref 3.5–5.0)
Alkaline Phosphatase: 62 U/L (ref 38–126)
Anion gap: 9 (ref 5–15)
BUN: 13 mg/dL (ref 8–23)
CO2: 26 mmol/L (ref 22–32)
Calcium: 8.9 mg/dL (ref 8.9–10.3)
Chloride: 99 mmol/L (ref 98–111)
Creatinine, Ser: 0.59 mg/dL — ABNORMAL LOW (ref 0.61–1.24)
GFR, Estimated: >60 mL/min (ref >60)
Glucose, Bld: 75 mg/dL (ref 70–99)
Potassium: 4.2 mmol/L (ref 3.5–5.1)
Sodium: 134 mmol/L — ABNORMAL LOW (ref 135–145)
Total Bilirubin: 0.8 mg/dL (ref 0.3–1.2)
Total Protein: 6.7 g/dL (ref 6.5–8.1)

## 2021-07-17 LAB — CBC
HCT: 31.6 % — ABNORMAL LOW (ref 39.0–52.0)
Hemoglobin: 10.1 g/dL — ABNORMAL LOW (ref 13.0–17.0)
MCH: 30.4 pg (ref 26.0–34.0)
MCHC: 32 g/dL (ref 30.0–36.0)
MCV: 95.2 fL (ref 80.0–100.0)
Platelets: 283 10*3/uL (ref 150–400)
RBC: 3.32 MIL/uL — ABNORMAL LOW (ref 4.22–5.81)
RDW: 12.3 % (ref 11.5–15.5)
WBC: 12.5 10*3/uL — ABNORMAL HIGH (ref 4.0–10.5)
nRBC: 0 % (ref 0.0–0.2)

## 2021-07-17 LAB — CBC WITH DIFFERENTIAL/PLATELET
Abs Immature Granulocytes: 0.03 10*3/uL (ref 0.00–0.07)
Basophils Absolute: 0 10*3/uL (ref 0.0–0.1)
Basophils Relative: 0 %
Eosinophils Absolute: 0.1 10*3/uL (ref 0.0–0.5)
Eosinophils Relative: 1 %
HCT: 34.6 % — ABNORMAL LOW (ref 39.0–52.0)
Hemoglobin: 10.8 g/dL — ABNORMAL LOW (ref 13.0–17.0)
Immature Granulocytes: 0 %
Lymphocytes Relative: 19 %
Lymphs Abs: 1.6 10*3/uL (ref 0.7–4.0)
MCH: 30.3 pg (ref 26.0–34.0)
MCHC: 31.2 g/dL (ref 30.0–36.0)
MCV: 96.9 fL (ref 80.0–100.0)
Monocytes Absolute: 0.4 10*3/uL (ref 0.1–1.0)
Monocytes Relative: 5 %
Neutro Abs: 6.2 10*3/uL (ref 1.7–7.7)
Neutrophils Relative %: 75 %
Platelets: 289 10*3/uL (ref 150–400)
RBC: 3.57 MIL/uL — ABNORMAL LOW (ref 4.22–5.81)
RDW: 12.2 % (ref 11.5–15.5)
WBC: 8.3 10*3/uL (ref 4.0–10.5)
nRBC: 0 % (ref 0.0–0.2)

## 2021-07-17 LAB — RESP PANEL BY RT-PCR (FLU A&B, COVID) ARPGX2
Influenza A by PCR: NEGATIVE
Influenza B by PCR: NEGATIVE
SARS Coronavirus 2 by RT PCR: NEGATIVE

## 2021-07-17 LAB — CREATININE, SERUM
Creatinine, Ser: 0.66 mg/dL (ref 0.61–1.24)
GFR, Estimated: >60 mL/min (ref >60)

## 2021-07-17 LAB — VALPROIC ACID LEVEL: Valproic Acid Lvl: 24 ug/mL — ABNORMAL LOW (ref 50.0–100.0)

## 2021-07-17 MED ORDER — AMLODIPINE BESYLATE 2.5 MG PO TABS
2.5000 mg | ORAL_TABLET | Freq: Every day | ORAL | Status: DC
  Administered 2021-07-20 – 2021-07-24 (×5): 2.5 mg via ORAL
  Filled 2021-07-17 (×6): qty 1

## 2021-07-17 MED ORDER — TRAMADOL HCL 50 MG PO TABS: 100.0000 mg | ORAL_TABLET | Freq: Three times a day (TID) | ORAL | Status: DC

## 2021-07-17 MED ORDER — METOPROLOL TARTRATE 5 MG/5ML IV SOLN
2.5000 mg | Freq: Four times a day (QID) | INTRAVENOUS | Status: DC | PRN
  Administered 2021-07-19 (×3): 2.5 mg via INTRAVENOUS
  Filled 2021-07-17 (×2): qty 5

## 2021-07-17 MED ORDER — FLUOXETINE HCL 20 MG PO CAPS
40.0000 mg | ORAL_CAPSULE | Freq: Every day | ORAL | Status: DC
  Administered 2021-07-20 – 2021-07-28 (×9): 40 mg via ORAL
  Filled 2021-07-17 (×10): qty 2

## 2021-07-17 MED ORDER — ATORVASTATIN CALCIUM 10 MG PO TABS
20.0000 mg | ORAL_TABLET | Freq: Every day | ORAL | Status: DC
  Administered 2021-07-20 – 2021-07-28 (×9): 20 mg via ORAL
  Filled 2021-07-17 (×10): qty 2

## 2021-07-17 MED ORDER — ENOXAPARIN SODIUM 40 MG/0.4ML IJ SOSY
40.0000 mg | PREFILLED_SYRINGE | INTRAMUSCULAR | Status: DC
  Administered 2021-07-17 – 2021-07-27 (×11): 40 mg via SUBCUTANEOUS
  Filled 2021-07-17 (×11): qty 0.4

## 2021-07-17 MED ORDER — KETOROLAC TROMETHAMINE 15 MG/ML IJ SOLN
15.0000 mg | Freq: Once | INTRAMUSCULAR | Status: AC
  Administered 2021-07-17: 15 mg via INTRAVENOUS
  Filled 2021-07-17: qty 1

## 2021-07-17 MED ORDER — VALPROATE SODIUM 100 MG/ML IV SOLN
250.0000 mg | Freq: Four times a day (QID) | INTRAVENOUS | Status: DC
  Administered 2021-07-17 – 2021-07-18 (×4): 250 mg via INTRAVENOUS
  Filled 2021-07-17 (×6): qty 2.5

## 2021-07-17 MED ORDER — ACETAMINOPHEN 325 MG PO TABS
650.0000 mg | ORAL_TABLET | Freq: Four times a day (QID) | ORAL | Status: DC | PRN
  Administered 2021-07-19: 650 mg via ORAL
  Filled 2021-07-17: qty 2

## 2021-07-17 MED ORDER — MIRTAZAPINE 15 MG PO TABS
30.0000 mg | ORAL_TABLET | Freq: Every day | ORAL | Status: DC
  Administered 2021-07-19 – 2021-07-27 (×9): 30 mg via ORAL
  Filled 2021-07-17 (×9): qty 2

## 2021-07-17 MED ORDER — LORAZEPAM 2 MG/ML IJ SOLN: 2.0000 mg | Freq: Once | INTRAMUSCULAR | Status: AC

## 2021-07-17 MED ORDER — SODIUM CHLORIDE 0.9 % IV SOLN
3500.0000 mg | Freq: Once | INTRAVENOUS | Status: AC
  Administered 2021-07-17: 3500 mg via INTRAVENOUS
  Filled 2021-07-17: qty 35

## 2021-07-17 MED ORDER — BISACODYL 10 MG RE SUPP
10.0000 mg | Freq: Every day | RECTAL | Status: DC | PRN
  Administered 2021-07-26: 10 mg via RECTAL
  Filled 2021-07-17: qty 1

## 2021-07-17 MED ORDER — SODIUM CHLORIDE 0.9 % IV SOLN: INTRAVENOUS | Status: DC

## 2021-07-17 MED ORDER — LORAZEPAM 2 MG/ML IJ SOLN
INTRAMUSCULAR | Status: AC
  Administered 2021-07-17: 2 mg via INTRAVENOUS
  Filled 2021-07-17: qty 1

## 2021-07-17 MED ORDER — FLEET ENEMA 7-19 GM/118ML RE ENEM: 1.0000 | ENEMA | Freq: Once | RECTAL | Status: DC | PRN

## 2021-07-17 MED ORDER — ACETAMINOPHEN 650 MG RE SUPP
650.0000 mg | Freq: Four times a day (QID) | RECTAL | Status: DC | PRN
  Administered 2021-07-17: 650 mg via RECTAL
  Filled 2021-07-17: qty 1

## 2021-07-17 NOTE — ED Notes (Signed)
EEG at bedside at this time.

## 2021-07-17 NOTE — Procedures (Signed)
Patient Name: Brad [name]  MRN: [id number]  Epilepsy Attending: Lora Havens  Referring Physician/Provider: Anibal Henderson, NP Date: 07/17/2021 Duration: 27.20 mins  Patient history:  75yo M with h/o epilepsy presented with seizure described as gaze to right and facial twitching upon EMS arrival. EEG to evaluate for seizure  Level of alertness:  awake   AEDs during EEG study: LEV, VPA, Versed, Ativan  Technical aspects: This EEG study was done with scalp electrodes positioned according to the 10-20 International system of electrode placement. Electrical activity was acquired at a sampling rate of '500Hz'$  and reviewed with a high frequency filter of '70Hz'$  and a low frequency filter of '1Hz'$ . EEG data were recorded continuously and digitally stored.   Description: EEG showed continuous generalized  3 to 6 Hz theta-delta slowing. Physiologic photic driving was not seen during photic stimulation.  Hyperventilation was not performed.     Of note, eeg was technically difficult due to significant myogenic artifact.   ABNORMALITY - Continuous slow, generalized  IMPRESSION: This technically difficult study is suggestive of moderate diffuse encephalopathy, nonspecific etiology. No seizures or epileptiform discharges were seen throughout the recording.  If suspicion for interictal activity remains a concern, a prolonged study can be considered.   Wilhemina Grall Barbra Sarks

## 2021-07-17 NOTE — ED Notes (Signed)
Pt's sister called ([name]-[phone]). Sister will come to ED to visit pt. Sister reports that pt is normally communicative and is able to follow commands. She confirms that he is contracted at baseline.

## 2021-07-17 NOTE — ED Notes (Signed)
Pt's condom cath has come off. Pt provided with fresh brief. Pt clean and dry

## 2021-07-17 NOTE — ED Notes (Signed)
Pt's family member provided with coffee.

## 2021-07-17 NOTE — ED Triage Notes (Addendum)
Pt BIB EMS from [facility] for seizure activity. Staff found pt in active seizure "for about 20 minutes straight before calling EMS." (per EMS report.) Pt actively seizing with gaze to Right and facial twitching upon EMS arrival  2.5 midazolam given by EMS CBG 86 NSR  BP 172/112 100% non-rebreather mask Pulse 93  20G IV in R hand  DNR at bedside with pt

## 2021-07-17 NOTE — Consult Note (Signed)
NEURO HOSPITALIST CONSULT NOTE   Requesting physician: Dr. Billy Fischer  Reason for Consult: Status epilepticus  History obtained from:   Chart    HPI:                                                                                                                                          Brad [name] is a 75 y.o. male with a medical history significant for seizures on home divalproex, substance abuse, essential hypertension, type 2 diabetes mellitus, prostate cancer and depression who presented to the ED via EMS 07/17/2021 from [facility] for evaluation of seizure activity. Per staff at patient's SNF, patient was found actively seizing for approximately 20 minutes before EMS was activated. On EMS arrival, patient was seen with a right gaze with facial twitching with a blood glucose of 86 and was given 2.5 mg of midazolam. On arrival to the ED, patient again had witnessed seizure activity with right gaze and facial twitching and was given 2 mg of lorazepam and loaded with 3,500 mg Keppra with resolution of seizure activity on neurology evaluation.   At baseline, Brad [name] is bed-bound but SNF reports that he is typically conversant, oriented to self and location, and is able to communicate his needs with facility staff. He was noted by staff to be in his usual state of health until this morning when staff found him actively seizing.   Past Medical History:  Diagnosis Date   Anemia    Arthritis    Cancer ([place]) 2017   prostate   Depression    Diabetes mellitus without complication ([place])    History of stomach ulcers    Hypertension    Seizures ([place])    Substance abuse ([place])    Past Surgical History:  Procedure Laterality Date   herniated disc     KNEE SURGERY Right    Family History  Problem Relation Age of Onset   Seizures Neg Hx    Social History:  reports that he has quit smoking. His smoking use included cigarettes. He has never used  smokeless tobacco. He reports previous alcohol use of about 2.0 - 3.0 standard drinks of alcohol per week. He reports previous drug use. Drug: Marijuana.  No Known Allergies  MEDICATIONS:  I have reviewed the patient's current medications.  Current Outpatient Medications  Medication Instructions   acetaminophen (TYLENOL) 1,000 mg, Oral, Every 6 hours PRN   amLODipine (NORVASC) 2.5 mg, Oral, Daily   atorvastatin (LIPITOR) 20 mg, Oral, Daily   divalproex (DEPAKOTE SPRINKLE) 500 mg, Oral, Every 12 hours   Ensure (ENSURE) 237 mLs, Oral, 3 times daily   FLUoxetine (PROZAC) 40 mg, Oral, Daily   mirtazapine (REMERON) 30 mg, Oral, Daily at bedtime   Multiple Vitamin (MULTIVITAMIN WITH MINERALS) TABS tablet 1 tablet, Oral, Daily   OVER THE COUNTER MEDICATION 30 mLs, Oral, Daily, For 30 days<BR>Liquid protein   OVER THE COUNTER MEDICATION 237 mLs, Oral, See admin instructions, Two times daily between meals during med pass - 0900 and 1700<BR>Ensure or Boost   OVER THE COUNTER MEDICATION 30 mLs, Oral, 2 times daily, For weight loss and wound healing<BR>Pro heal   tamsulosin (FLOMAX) 0.4 mg, Oral, Daily at bedtime   vitamin C (ASCORBIC ACID) 500 mg, Oral, 2 times daily, For 30 days   Vitamin D2 2,000 Units, Oral, Daily, For 30 days   ROS:                                                                                                                                       History obtained from chart review and unobtainable from patient due to mental status  Blood pressure (!) 165/86, pulse 85, temperature 99 F (37.2 C), temperature source Axillary, resp. rate (!) 21, SpO2 98 %.  General Examination:                                                                                                       Physical Exam  Constitutional: Frail, chronically ill appearing male laying in ER  stretcher Head:  Normocephalic, and atraumatic, without obvious abnormality.   EENT: Arcus senilis bilaterally, normal external eye and conjunctiva. No OP obstruction. Cardiovascular: Irregular rate and rhythm on cardiac monitor,  no pedal edema noted Lungs:  Saturations within normal limits- 95% on room air, non-labored breathing Abdomen: All 4 quadrants palpated. Abdomen soft and non-distended Extremities: Flexed contractures noted throughout each extremity.  Musculoskeletal: Joints with contractures, severely limited ROM throughout. No joint swelling noted.  Skin: warm and dry with various dressings throughout lower extremities on knees, shins, right foot. Dressings are dry and intact without drainage present.   Neurological Examination Mental Status: Awake, alert, does not follow commands.  Constant moaning without attempt at verbalization. Patient is  unable to provide a clear or coherent history of present illness at this time. Unable to assess speech or orientation due to patient's altered mental status.  Attends to verbal and noxious stimuli.   No neglect noted. Cranial Nerves: II: PERRL III,IV, VI: Patient appears to have a right gaze preference but will intermittently cross midline into leftward visual field.  V,VII: Face appears symmetric resting and with grimace, unable to assess facial sensation.  VIII: Patient opens eyes to voice and will track voice in lateral visual fields intermittently IX,X: Unable to assess due to patient's mental status XI: Head appears grossly midline. XII: Unable to assess due to patient's condition, does not protrude tongue to command Motor: Tone increased throughout with flexion contractures of each extremity with minimal movement/withdrawal to noxious stimuli throughout. Bulk is severely decreased. No spontaneous movements noted. Sensory: Patient grimaces, moans, and withdraws each extremity equally and minimally with application of noxious stimuli  throughout. Deep Tendon Reflexes: 1+ and symmetric patellae, unable to elicit upper extremity reflexes 2/2 contractures Plantars: Right: UTA due to bandage covering right foot Left: upgoing Cerebellar: Unable to assess due to patient's condition Gait: deferred   Lab Results: Basic Metabolic Panel: No results for input(s): NA, K, CL, CO2, GLUCOSE, BUN, CREATININE, CALCIUM, MG, PHOS in the last 168 hours.  CBC: Recent Labs  Lab 07/17/21 0659  WBC 8.3  NEUTROABS 6.2  HGB 10.8*  HCT 34.6*  MCV 96.9  PLT 289   Cardiac Enzymes: No results for input(s): CKTOTAL, CKMB, CKMBINDEX, TROPONINI in the last 168 hours.  Lipid Panel: No results for input(s): CHOL, TRIG, HDL, CHOLHDL, VLDL, LDLCALC in the last 168 hours.  Imaging: No results found.  Assessment: 75 year old male with dementia and a history of seizures (on VPA), presenting to the ED from his SNF after having a seizure. EMS administered 2.5 mg Versed in the field, with resolution of clinical seizure activity. While in the ED seizure recurrence was noted. He was administered 2 mg IV Ativan as well as 3500 mg IV Keppra.  - Exam reveals quadriplegic patient with flexion contractures/rigidity of all extremities who does not follow commands and has no usable speech. His only vocalizations consist of moaning and he does not nod/shake his head to examiner questions. He has a right gaze preference but will attend to verbal stimuli in the left visual field consistently on neurology examination. - Witnessed seizure activity per EMS and EDP s/p midazolam 2.5 mg, lorazepam 2 mg, and keppra load 3,500 mg IV without overt seizure activity noted on neurology initial evaluation. - On chart review, he was hospitalized in December of 2021 with EEG correlate of electrographic discharges, during 2 focal motor seizures involving right arm rhythmic twitching, arising from the left>right parieto-occipital regions, thought to be secondary to subtherapeutic  valproic acid levels. Per chart review, patient's seizure activity typically involves abnormal motor activity of his RUE. - Presentation is most consistent with breakthrough seizure with post-ictal state as patient has had multiple similar presentations in the past related to breakthrough seizures. Seizure etiology may be related to toxic-metabolic derangements, infection, missed medication, or subtherapeutic valproic acid level. Further work up pending.  Recommendations: - Valproic acid level pending - STAT EEG ordered, pending - Continue home Depakote dosing with transition to IV formulation  - Total daily dose at home of Divalproex is 1,000 mg  - Initiate valproic acid IV 1,000 mg given in 4 divided doses of 250 mg for now, pending serum valproic acid level -  Inpatient seizure precautions - IV Ativan 2 mg PRN seizure lasting > 5 minutes and notify neurology  - Infectious work up with CBC, CXR, UA  Anibal Henderson, AGACNP-BC [facility] [phone]  Electronically signed: Dr. Kerney Elbe 07/17/2021, 7:34 AM

## 2021-07-17 NOTE — ED Notes (Signed)
Patient transported to CT 

## 2021-07-17 NOTE — Plan of Care (Signed)

## 2021-07-17 NOTE — ED Notes (Signed)
Dr. Billy Fischer went to bedside to assess pt and pt began having a seizure. New orders received.

## 2021-07-17 NOTE — Progress Notes (Signed)
EEG complete - results pending 

## 2021-07-17 NOTE — ED Provider Notes (Addendum)
[facility] EMERGENCY DEPARTMENT Provider Note   CSN: [id number] Arrival date & time: 07/17/21  [id number]     History No chief complaint on file.   Brad [name] is a 75 y.o. male.  HPI     75yo male with history of DM, htn, cervical stenosis, seizures, who presents from [place] with concern for seizures.   Normally speaks, able to voice needs, normally oriented to self, location Not specific to dates Total care patient, bed bound, incontinent, caregivers feed him and eats everything, drinks thin liquids, puree diet Normally follows commands, speak, lets his needs known, typically can have conversations  Today had a seizure that lasted 20 minutes PTA, EMS gave 2.'5mg'$  versed Otherwise been in a normal state of health, no fevers, cough, diarrhea, not vomiting, eating ok Staff surprised he was sent in today No changes of medication Gets scheduled tramadol for pain, has been screaming out in pain which is typical for him, generalized     Past Medical History:  Diagnosis Date   Anemia    Arthritis    Cancer ([place]) 2017   prostate   Depression    Diabetes mellitus without complication ([place])    History of stomach ulcers    Hypertension    Seizures ([place])    Substance abuse [facility])     Patient Active Problem List   Diagnosis Date Noted   Dementia ([place]) 03/01/2021   COVID-19 03/01/2021   Femur neck fracture ([place]) 02/28/2021   Protein-calorie malnutrition, severe 12/30/2020   Cachexia ([place]) 12/25/2020   Pressure injury of skin 12/06/2020   Seizure ([place]) 12/05/2020   Diabetes ([place]) 12/05/2020   Breakthrough seizure ([place]) 12/05/2020   Cervical spinal stenosis 12/14/2017   Abnormal CT scan, neck 12/14/2017   History of stroke 12/12/2017   Hyperlipidemia 12/12/2017   Seizure disorder ([place]) 12/12/2017   KNEE PAIN, RIGHT 06/11/2009   NECK PAIN 06/11/2009   Hypertension 02/13/2008   DENTAL CARIES 02/11/2008    Past Surgical History:  Procedure  Laterality Date   herniated disc     KNEE SURGERY Right        Family History  Problem Relation Age of Onset   Seizures Neg Hx     Social History   Tobacco Use   Smoking status: Former    Types: Cigarettes   Smokeless tobacco: Never   Tobacco comments:    occasional- patient smokes some marijauna from time to time  Vaping Use   Vaping Use: Never used  Substance Use Topics   Alcohol use: Not Currently    Alcohol/week: 2.0 - 3.0 standard drinks    Types: 2 - 3 Cans of beer per week    Comment: occasional   Drug use: Not Currently    Types: Marijuana    Comment: occasional    Home Medications Prior to Admission medications   Medication Sig Start Date End Date Taking? Authorizing Provider  acetaminophen (TYLENOL) 500 MG tablet Take 1,000 mg by mouth every 6 (six) hours as needed for moderate pain.    [provider]  amLODipine (NORVASC) 2.5 MG tablet Take 1 tablet (2.5 mg total) by mouth daily. 04/19/18   Tereasa Coop, PA-C  atorvastatin (LIPITOR) 20 MG tablet Take 1 tablet (20 mg total) by mouth daily. Patient taking differently: Take 20 mg by mouth at bedtime. 04/19/18   Tereasa Coop, PA-C  divalproex (DEPAKOTE SPRINKLE) 125 MG capsule Take 4 capsules (500 mg total) by mouth every 12 (  twelve) hours. 12/27/20   Regalado, Belkys A, MD  Ensure (ENSURE) Take 237 mLs by mouth in the morning, at noon, and at bedtime.    [provider]  Ergocalciferol (VITAMIN D2) 50 MCG (2000 UT) TABS Take 2,000 Units by mouth daily. For 30 days    [provider]  FLUoxetine (PROZAC) 20 MG capsule Take 2 capsules (40 mg total) by mouth daily. 04/19/18   Tereasa Coop, PA-C  mirtazapine (REMERON) 30 MG tablet Take 30 mg by mouth at bedtime. 10/30/19   [provider]  Multiple Vitamin (MULTIVITAMIN WITH MINERALS) TABS tablet Take 1 tablet by mouth daily. 12/27/20   Regalado, Belkys A, MD  OVER THE COUNTER MEDICATION Take 30 mLs by mouth daily. For 30  days Liquid protein    [provider]  OVER THE COUNTER MEDICATION Take 237 mLs by mouth See admin instructions. Two times daily between meals during med pass - 0900 and 1700 Ensure or Boost    [provider]  OVER THE COUNTER MEDICATION Take 30 mLs by mouth 2 (two) times daily. For weight loss and wound healing Pro heal    [provider]  tamsulosin (FLOMAX) 0.4 MG CAPS capsule Take 0.4 mg by mouth at bedtime. 05/03/20   [provider]  vitamin C (ASCORBIC ACID) 500 MG tablet Take 500 mg by mouth 2 (two) times daily. For 30 days    [provider]    Allergies    Patient has no known allergies.  Review of Systems   Review of Systems  Unable to perform ROS: Mental status change  Constitutional:  Negative for fever.  Respiratory:  Negative for cough.   Gastrointestinal:  Negative for blood in stool, diarrhea and vomiting.  Skin:  Positive for wound.   Physical Exam Updated Vital Signs BP (!) 154/93   Pulse 95   Temp 99 F (37.2 C) (Axillary)   Resp 18   SpO2 100%   Physical Exam Vitals and nursing note reviewed.  Constitutional:      General: He is not in acute distress.    Appearance: He is cachectic. He is ill-appearing. He is not diaphoretic.  HENT:     Head: Normocephalic and atraumatic.  Eyes:     Conjunctiva/sclera: Conjunctivae normal.  Cardiovascular:     Rate and Rhythm: Normal rate and regular rhythm.     Heart sounds: Normal heart sounds. No murmur heard.   No friction rub. No gallop.  Pulmonary:     Effort: Pulmonary effort is normal. No respiratory distress.     Breath sounds: Normal breath sounds. No wheezing or rales.  Abdominal:     General: There is no distension.     Palpations: Abdomen is soft.     Tenderness: There is no abdominal tenderness. There is no guarding.  Musculoskeletal:     Cervical back: Normal range of motion.  Skin:    General: Skin is warm and dry.  Neurological:     GCS: GCS eye  subscore is 4. GCS verbal subscore is 2. GCS motor subscore is 4.     Comments: Looks to voice, not vocalizing, not following commands Contracted    ED Results / Procedures / Treatments   Labs (all labs ordered are listed, but only abnormal results are displayed) Labs Reviewed  CBC WITH DIFFERENTIAL/PLATELET - Abnormal; Notable for the following components:      Result Value   RBC 3.57 (*)    Hemoglobin 10.8 (*)  HCT 34.6 (*)    All other components within normal limits  COMPREHENSIVE METABOLIC PANEL - Abnormal; Notable for the following components:   Sodium 134 (*)    Creatinine, Ser 0.59 (*)    Albumin 2.6 (*)    All other components within normal limits  VALPROIC ACID LEVEL - Abnormal; Notable for the following components:   Valproic Acid Lvl 24 (*)    All other components within normal limits  RESP PANEL BY RT-PCR (FLU A&B, COVID) [id number]  URINALYSIS, ROUTINE W REFLEX MICROSCOPIC    EKG EKG Interpretation  Date/Time:  Sunday July 17 2021 07:50:30 EDT Ventricular Rate:  90 PR Interval:    QRS Duration: 108 QT Interval:  371 QTC Calculation: 454 R Axis:   -76 Text Interpretation: Artifact, sinus rhythm Left anterior fascicular block Anterior infarct, age indeterminate Artifact in lead(s) I II III aVR aVL aVF V2 V3 V4 V5 V6 Confirmed by Gareth Morgan [phone]) on 07/17/2021 10:56:26 AM  Radiology CT Head Wo Contrast  Result Date: 07/17/2021 CLINICAL DATA:  Altered mental status.  Head trauma. EXAM: CT HEAD WITHOUT CONTRAST TECHNIQUE: Contiguous axial images were obtained from the base of the skull through the vertex without intravenous contrast. COMPARISON:  02/28/2021 FINDINGS: Brain: Age advanced cerebral atrophy. Moderate ventriculomegaly is similar. Moderate periventricular white matter hypoattenuation may be slightly progressive. Suspect remote right basal ganglia lacunar infarcts. No hemorrhage, intra-axial, or extra-axial fluid collection. Vascular: No hyperdense  vessel or unexpected calcification. Intracranial atherosclerosis. Skull: No significant soft tissue swelling.  No skull fracture. Sinuses/Orbits: Normal imaged portions of the orbits and globes. Clear paranasal sinuses and mastoid air cells. Other: None. IMPRESSION: 1. Cerebral atrophy with moderate similar ventriculomegaly. Ventriculomegaly could be secondary to atrophy or represent normal pressure hydrocephalus. Periventricular white matter hypoattenuation may be slightly progressive and could represent small vessel ischemic change or transependymal CSF resorption. 2. Otherwise, no acute intracranial abnormality. Electronically Signed   By: Abigail Miyamoto M.D.   On: 07/17/2021 11:45   DG Chest Portable 1 View  Result Date: 07/17/2021 CLINICAL DATA:  Acute mental status change.  Seizure. EXAM: PORTABLE CHEST 1 VIEW COMPARISON:  February 28, 2021 FINDINGS: No pneumothorax. The cardiomediastinal silhouette is stable. The lungs are clear. No acute abnormalities are identified. IMPRESSION: No active disease. Electronically Signed   By: Dorise Bullion III M.D   On: 07/17/2021 09:16   EEG adult  Result Date: 07/17/2021 Lora Havens, MD     07/17/2021 11:00 AM Patient Name: Brad [name] MRN: [id number] Epilepsy Attending: Lora Havens Referring Physician/Provider: Anibal Henderson, NP Date: 07/17/2021 Duration: 27.20 mins Patient history:  75yo M with h/o epilepsy presented with seizure described as gaze to right and facial twitching upon EMS arrival. EEG to evaluate for seizure Level of alertness:  awake AEDs during EEG study: LEV, VPA, Versed, Ativan Technical aspects: This EEG study was done with scalp electrodes positioned according to the 10-20 International system of electrode placement. Electrical activity was acquired at a sampling rate of '500Hz'$  and reviewed with a high frequency filter of '70Hz'$  and a low frequency filter of '1Hz'$ . EEG data were recorded continuously and digitally stored. Description: EEG  showed continuous generalized  3 to 6 Hz theta-delta slowing. Physiologic photic driving was not seen during photic stimulation.  Hyperventilation was not performed.   Of note, eeg was technically difficult due to significant myogenic artifact. ABNORMALITY - Continuous slow, generalized IMPRESSION: This technically difficult study is suggestive of moderate diffuse encephalopathy, nonspecific etiology.  No seizures or epileptiform discharges were seen throughout the recording. If suspicion for interictal activity remains a concern, a prolonged study can be considered. [place]    Procedures .Critical Care  Date/Time: 07/17/2021 12:28 PM Performed by: Gareth Morgan, MD Authorized by: Gareth Morgan, MD   Critical care provider statement:    Critical care time (minutes):  45   Critical care was time spent personally by me on the following activities:  Discussions with consultants, evaluation of patient's response to treatment, examination of patient, ordering and performing treatments and interventions, ordering and review of laboratory studies, ordering and review of radiographic studies, pulse oximetry, re-evaluation of patient's condition, obtaining history from patient or surrogate and review of old charts   Medications Ordered in ED Medications  valproate (DEPACON) 250 mg in dextrose 5 % 50 mL IVPB (0 mg Intravenous Stopped 07/17/21 1207)  LORazepam (ATIVAN) injection 2 mg (2 mg Intravenous Given 07/17/21 0711)  levETIRAcetam (KEPPRA) 3,500 mg in sodium chloride 0.9 % 250 mL IVPB (0 mg Intravenous Stopped 07/17/21 0831)    ED Course  I have reviewed the triage vital signs and the nursing notes.  Pertinent labs & imaging results that were available during my care of the patient were reviewed by me and considered in my medical decision making (see chart for details).    MDM Rules/Calculators/A&P                            75yo male with history of DM, htn, cervical stenosis,  seizures, who presents from [place] with concern for seizures.  Reportedly had a seizure for 20 minutes prior to EMS arrival, received Versed with improvement, on my evaluation in the emergency department, he is actively seizing with right gaze deviation and facial twitching which improved with 2 mg of IV Ativan.  He then was having times of right-sided gaze deviation, which unclear if he is having continuing intermittent seizures.  Consulted neurology.  He was loaded with Keppra, 60 mg/kg, and lab work and CT has been ordered.  Facility reports he was in normal state of health until seizures this morning.   Labs show no significant electrolyte abnormalities. No leukocytosis, no UTI, no pneumonia on CXR.  Valproic acid levels low.  EEG completed.   CT head moderate similar ventriculomegaly.  Will admit for continued evaluation for altered mental status suspect secondary to seizures with subtherapeutic medication levels.     Final Clinical Impression(s) / ED Diagnoses Final diagnoses:  Seizures [facility])    Rx / DC Orders ED Discharge Orders     None        Gareth Morgan, MD 07/17/21 1210    Gareth Morgan, MD 07/17/21 1228

## 2021-07-17 NOTE — ED Notes (Signed)
Condom cath applied to pt. 

## 2021-07-17 NOTE — H&P (Signed)
History and Physical:    Brad [name]   [id number] DOB: [birth year] DOA: 07/17/2021  Referring MD/provider: Dr. Billy [name] PCP: Brad Moro, DO   Patient coming from: Home  Chief Complaint: Seizures.  History is entirely per chart review as patient is unable to provide any history.  History of Present Illness:   Brad [name] is an 75 y.o. male with history of dementia, HTN, DM2, prostate CA, seizure disorder, substance abuse who lives in a nursing home and is bedbound with multiple contractures who was sent in from a [facility] for seizures.  EMS was called and he was noted to have right gaze preference with facial twitching.  He was treated with Versed with some improvement and then seizures recurred again.  Apparently at baseline patient is able to talk with his family.  Patient's sister who is at bedside states that he does know she is and is able to answer yes/no questions appropriately.  ED Course:  The patient was noted to have several seizures in the ED which were treated with lorazepam however they tended to recur.  Patient was loaded with Keppra.  Patient underwent EEG which was negative for seizure activity while the EEG was being done.  Patient was seen by neurology who started patient on valproate.  ROS:   ROS   Review of Systems: Patient unable to provide any meaningful communication  Past Medical History:   Past Medical History:  Diagnosis Date   Anemia    Arthritis    Cancer ([place]) 2017   prostate   Depression    Diabetes mellitus without complication ([place])    History of stomach ulcers    Hypertension    Seizures ([place])    Substance abuse ([name])     Past Surgical History:   Past Surgical History:  Procedure Laterality Date   herniated disc     KNEE SURGERY Right     Social History:   Social History   Socioeconomic History   Marital status: Single    Spouse name: Not on file   Number of children: Not on file   Years  of education: Not on file   Highest education level: High school graduate  Occupational History   Occupation: retired  Tobacco Use   Smoking status: Former    Types: Cigarettes   Smokeless tobacco: Never   Tobacco comments:    occasional- patient smokes some marijauna from time to time  Vaping Use   Vaping Use: Never used  Substance and Sexual Activity   Alcohol use: Not Currently    Alcohol/week: 2.0 - 3.0 standard drinks    Types: 2 - 3 Cans of beer per week    Comment: occasional   Drug use: Not Currently    Types: Marijuana    Comment: occasional   Sexual activity: Not on file  Other Topics Concern   Not on file  Social History Narrative   Not on file   Social Determinants of Health   Financial Resource Strain: Not on file  Food Insecurity: Not on file  Transportation Needs: Not on file  Physical Activity: Not on file  Stress: Not on file  Social Connections: Not on file  Intimate Partner Violence: Not on file    Allergies   Patient has no known allergies.  Family history:   Family History  Problem Relation Age of Onset   Seizures Neg Hx     Current Medications:   Prior to Admission medications  Medication Sig Start Date End Date Taking? Authorizing Provider  acetaminophen (TYLENOL) 500 MG tablet Take 1,000 mg by mouth every 6 (six) hours as needed for moderate pain.    [provider]  amLODipine (NORVASC) 2.5 MG tablet Take 1 tablet (2.5 mg total) by mouth daily. 04/19/18   Tereasa Coop, PA-C  atorvastatin (LIPITOR) 20 MG tablet Take 1 tablet (20 mg total) by mouth daily. Patient taking differently: Take 20 mg by mouth at bedtime. 04/19/18   Tereasa Coop, PA-C  divalproex (DEPAKOTE SPRINKLE) 125 MG capsule Take 4 capsules (500 mg total) by mouth every 12 (twelve) hours. 12/27/20   Regalado, Belkys A, MD  Ensure (ENSURE) Take 237 mLs by mouth in the morning, at noon, and at bedtime.    [provider]  Ergocalciferol (VITAMIN D2) 50  MCG (2000 UT) TABS Take 2,000 Units by mouth daily. For 30 days    [provider]  FLUoxetine (PROZAC) 20 MG capsule Take 2 capsules (40 mg total) by mouth daily. 04/19/18   Tereasa Coop, PA-C  mirtazapine (REMERON) 30 MG tablet Take 30 mg by mouth at bedtime. 10/30/19   [provider]  Multiple Vitamin (MULTIVITAMIN WITH MINERALS) TABS tablet Take 1 tablet by mouth daily. 12/27/20   Regalado, Belkys A, MD  OVER THE COUNTER MEDICATION Take 30 mLs by mouth daily. For 30 days Liquid protein    [provider]  OVER THE COUNTER MEDICATION Take 237 mLs by mouth See admin instructions. Two times daily between meals during med pass - 0900 and 1700 Ensure or Boost    [provider]  OVER THE COUNTER MEDICATION Take 30 mLs by mouth 2 (two) times daily. For weight loss and wound healing Pro heal    [provider]  tamsulosin (FLOMAX) 0.4 MG CAPS capsule Take 0.4 mg by mouth at bedtime. 05/03/20   [provider]  vitamin C (ASCORBIC ACID) 500 MG tablet Take 500 mg by mouth 2 (two) times daily. For 30 days    [provider]    Physical Exam:   Vitals:   07/17/21 1200 07/17/21 1215 07/17/21 1230 07/17/21 1300  BP: (!) 161/88 (!) 154/93 (!) 160/89 (!) 152/87  Pulse: 99 95 96 99  Resp: '19 18 12 11  '$ Temp:      TempSrc:      SpO2: 100% 100% 100% 100%     Physical Exam: Blood pressure (!) 152/87, pulse 99, temperature 99 F (37.2 C), temperature source Axillary, resp. rate 11, SpO2 100 %. Gen: Patient with multiple flexion contractures of elbows wrists hips and knees lying in bed with altered mental status.  Eyes are intermittently open however he does not look at me when I call his name as his sister says he normally would where he and his normal mental status. CVS: S1-S2, regular Respiratory:  decreased air entry  GI: NABS, soft, NT  LE: Multiple flexion contractures of elbows, wrists, hips and knees Neuro: Unable to assess  given contractures and altered mental status   Data Review:    Labs: Basic Metabolic Panel: Recent Labs  Lab 07/17/21 0659  NA 134*  K 4.2  CL 99  CO2 26  GLUCOSE 75  BUN 13  CREATININE 0.59*  CALCIUM 8.9   Liver Function Tests: Recent Labs  Lab 07/17/21 0659  AST 20  ALT 14  ALKPHOS 62  BILITOT 0.8  PROT 6.7  ALBUMIN 2.6*   No results for input(s): LIPASE,  AMYLASE in the last 168 hours. No results for input(s): AMMONIA in the last 168 hours. CBC: Recent Labs  Lab 07/17/21 0659  WBC 8.3  NEUTROABS 6.2  HGB 10.8*  HCT 34.6*  MCV 96.9  PLT 289   Cardiac Enzymes: No results for input(s): CKTOTAL, CKMB, CKMBINDEX, TROPONINI in the last 168 hours.  BNP (last 3 results) No results for input(s): PROBNP in the last 8760 hours. CBG: No results for input(s): GLUCAP in the last 168 hours.  Urinalysis    Component Value Date/Time   COLORURINE YELLOW 07/17/2021 0935   APPEARANCEUR CLEAR 07/17/2021 0935   LABSPEC 1.015 07/17/2021 0935   PHURINE 8.0 07/17/2021 0935   GLUCOSEU NEGATIVE 07/17/2021 0935   HGBUR NEGATIVE 07/17/2021 0935   HGBUR negative 05/29/2008 1402   BILIRUBINUR NEGATIVE 07/17/2021 0935   KETONESUR NEGATIVE 07/17/2021 0935   PROTEINUR NEGATIVE 07/17/2021 0935   UROBILINOGEN 2.0 (H) 09/17/2020 1417   NITRITE NEGATIVE 07/17/2021 0935   LEUKOCYTESUR NEGATIVE 07/17/2021 0935      Radiographic Studies: CT Head Wo Contrast  Result Date: 07/17/2021 CLINICAL DATA:  Altered mental status.  Head trauma. EXAM: CT HEAD WITHOUT CONTRAST TECHNIQUE: Contiguous axial images were obtained from the base of the skull through the vertex without intravenous contrast. COMPARISON:  02/28/2021 FINDINGS: Brain: Age advanced cerebral atrophy. Moderate ventriculomegaly is similar. Moderate periventricular white matter hypoattenuation may be slightly progressive. Suspect remote right basal ganglia lacunar infarcts. No hemorrhage, intra-axial, or extra-axial fluid  collection. Vascular: No hyperdense vessel or unexpected calcification. Intracranial atherosclerosis. Skull: No significant soft tissue swelling.  No skull fracture. Sinuses/Orbits: Normal imaged portions of the orbits and globes. Clear paranasal sinuses and mastoid air cells. Other: None. IMPRESSION: 1. Cerebral atrophy with moderate similar ventriculomegaly. Ventriculomegaly could be secondary to atrophy or represent normal pressure hydrocephalus. Periventricular white matter hypoattenuation may be slightly progressive and could represent small vessel ischemic change or transependymal CSF resorption. 2. Otherwise, no acute intracranial abnormality. Electronically Signed   By: Brad [name] M.D.   On: 07/17/2021 11:45   DG Chest Portable 1 View  Result Date: 07/17/2021 CLINICAL DATA:  Acute mental status change.  Seizure. EXAM: PORTABLE CHEST 1 VIEW COMPARISON:  February 28, 2021 FINDINGS: No pneumothorax. The cardiomediastinal silhouette is stable. The lungs are clear. No acute abnormalities are identified. IMPRESSION: No active disease. Electronically Signed   By: Brad [name] M.D   On: 07/17/2021 09:16   EEG adult  Result Date: 07/17/2021 Brad Havens, MD     07/17/2021 11:00 AM Patient Name: Brad [name] MRN: [id number] Epilepsy Attending: Lora [name] Referring Physician/Provider: Anibal Henderson, NP Date: 07/17/2021 Duration: 27.20 mins Patient history:  75yo M with h/o epilepsy presented with seizure described as gaze to right and facial twitching upon EMS arrival. EEG to evaluate for seizure Level of alertness:  awake AEDs during EEG study: LEV, VPA, Versed, Ativan Technical aspects: This EEG study was done with scalp electrodes positioned according to the 10-20 International system of electrode placement. Electrical activity was acquired at a sampling rate of '500Hz'$  and reviewed with a high frequency filter of '70Hz'$  and a low frequency filter of '1Hz'$ . EEG data were recorded continuously and  digitally stored. Description: EEG showed continuous generalized  3 to 6 Hz theta-delta slowing. Physiologic photic driving was not seen during photic stimulation.  Hyperventilation was not performed.   Of note, eeg was technically difficult due to significant myogenic artifact. ABNORMALITY - Continuous slow, generalized IMPRESSION: This technically difficult  study is suggestive of moderate diffuse encephalopathy, nonspecific etiology. No seizures or epileptiform discharges were seen throughout the recording. If suspicion for interictal activity remains a concern, a prolonged study can be considered. Priyanka Barbra Sarks    EKG: Independently reviewed.  Sinus rhythm at 90 with respiratory variation.  Poor baseline.  LAD at -33.  Diffusely flat T waves throughout.   Assessment/Plan:   Principal Problem:   Seizures ([place]) Active Problems:   Hypertension   Diabetes ([place])   Pressure injury of skin   Dementia ([place])   Bedbound  75 year old man who is bedbound with multiple contractures is admitted with breakthrough seizures and altered mental status thought to be postictal state.   Altered Mental status Most likely secondary to postictal state after multiple repeated seizures Patient has also received multiple doses of Ativan Compared to previous descriptions, patient seems to be much more awake than he was earlier today Patient's sister states that at baseline he is able to talk to family and staff but is limited in his memory. Will hold patient's Robaxin which can also depress mental status If mental status does not improve overnight, can consider MRI to rule out stroke although given multiple seizures postictal state is most likely diagnosis. Patient sister states the last time she saw him like this was when he had been having seizures previously.  Seizure disorder  Patient being actively followed by neurology Patient has been treated with Ativan, Keppra and valproate Further management of  seizure disorder per neurology  Goals for care Given his multiple contractures and dementia, discussed goals for care with with his sister Brad [name] with whom he used to live with. We discussed issues around quality of life and effects of ongoing seizures on his mental functioning. She notes this was brought up at last hospitalization but was not ready to discuss that at that time. She states she will think about it but is not ready for palliative care consultation as yet. She is hopeful that he will improve over the next couple of days.  HTN Continue amlodipine  Anxiety and depression Continue mirtazapine and fluoxetine  Chronic pain Continue tramadol   Other information:   DVT prophylaxis: Lovenox ordered. Code Status: DNR Family Communication: Patient's sister  Brad [name] was at bedside throughout Disposition Plan: SNF, [facility] called: Neurology Admission status: Inpatient  [facility]  If 7PM-7AM, please contact night-coverage [email or web address]

## 2021-07-18 ENCOUNTER — Inpatient Hospital Stay (HOSPITAL_COMMUNITY): Payer: Medicare Other

## 2021-07-18 DIAGNOSIS — I10 Essential (primary) hypertension: Secondary | ICD-10-CM | POA: Diagnosis not present

## 2021-07-18 DIAGNOSIS — R569 Unspecified convulsions: Secondary | ICD-10-CM | POA: Diagnosis not present

## 2021-07-18 LAB — BASIC METABOLIC PANEL
Anion gap: 8 (ref 5–15)
BUN: 15 mg/dL (ref 8–23)
CO2: 27 mmol/L (ref 22–32)
Calcium: 8.7 mg/dL — ABNORMAL LOW (ref 8.9–10.3)
Chloride: 100 mmol/L (ref 98–111)
Creatinine, Ser: 0.64 mg/dL (ref 0.61–1.24)
GFR, Estimated: >60 mL/min (ref >60)
Glucose, Bld: 67 mg/dL — ABNORMAL LOW (ref 70–99)
Potassium: 3.4 mmol/L — ABNORMAL LOW (ref 3.5–5.1)
Sodium: 135 mmol/L (ref 135–145)

## 2021-07-18 LAB — CK: Total CK: 101 U/L (ref 49–397)

## 2021-07-18 LAB — CBC
HCT: 28.4 % — ABNORMAL LOW (ref 39.0–52.0)
Hemoglobin: 8.9 g/dL — ABNORMAL LOW (ref 13.0–17.0)
MCH: 30.1 pg (ref 26.0–34.0)
MCHC: 31.3 g/dL (ref 30.0–36.0)
MCV: 95.9 fL (ref 80.0–100.0)
Platelets: 261 10*3/uL (ref 150–400)
RBC: 2.96 MIL/uL — ABNORMAL LOW (ref 4.22–5.81)
RDW: 12.4 % (ref 11.5–15.5)
WBC: 8.4 10*3/uL (ref 4.0–10.5)
nRBC: 0 % (ref 0.0–0.2)

## 2021-07-18 LAB — VALPROIC ACID LEVEL
Valproic Acid Lvl: 29 ug/mL — ABNORMAL LOW (ref 50.0–100.0)
Valproic Acid Lvl: 39 ug/mL — ABNORMAL LOW (ref 50.0–100.0)

## 2021-07-18 LAB — VITAMIN B12: Vitamin B-12: 1081 pg/mL — ABNORMAL HIGH (ref 180–914)

## 2021-07-18 LAB — AMMONIA: Ammonia: 16 umol/L (ref 9–35)

## 2021-07-18 LAB — TSH: TSH: 4.976 u[IU]/mL — ABNORMAL HIGH (ref 0.350–4.500)

## 2021-07-18 MED ORDER — THIAMINE HCL 100 MG/ML IJ SOLN
100.0000 mg | Freq: Every day | INTRAMUSCULAR | Status: DC
  Administered 2021-07-18 – 2021-07-19 (×2): 100 mg via INTRAVENOUS
  Filled 2021-07-18 (×3): qty 2

## 2021-07-18 MED ORDER — POTASSIUM CHLORIDE 10 MEQ/100ML IV SOLN
10.0000 meq | INTRAVENOUS | Status: AC
  Administered 2021-07-18 (×3): 10 meq via INTRAVENOUS
  Filled 2021-07-18 (×3): qty 100

## 2021-07-18 MED ORDER — VALPROATE SODIUM 100 MG/ML IV SOLN
500.0000 mg | Freq: Every day | INTRAVENOUS | Status: DC
  Administered 2021-07-18: 500 mg via INTRAVENOUS
  Filled 2021-07-18 (×2): qty 5

## 2021-07-18 MED ORDER — POTASSIUM CHLORIDE CRYS ER 20 MEQ PO TBCR: 40.0000 meq | EXTENDED_RELEASE_TABLET | Freq: Once | ORAL | Status: DC

## 2021-07-18 MED ORDER — THIAMINE HCL 100 MG PO TABS
100.0000 mg | ORAL_TABLET | Freq: Every day | ORAL | Status: DC
  Administered 2021-07-20 – 2021-07-28 (×9): 100 mg via ORAL
  Filled 2021-07-18 (×10): qty 1

## 2021-07-18 MED ORDER — VALPROATE SODIUM 100 MG/ML IV SOLN
250.0000 mg | Freq: Every day | INTRAVENOUS | Status: DC
  Filled 2021-07-18: qty 2.5

## 2021-07-18 MED ORDER — VALPROATE SODIUM 100 MG/ML IV SOLN
250.0000 mg | Freq: Three times a day (TID) | INTRAVENOUS | Status: DC
  Administered 2021-07-18 – 2021-07-19 (×3): 250 mg via INTRAVENOUS
  Filled 2021-07-18 (×5): qty 2.5

## 2021-07-18 MED ORDER — VALPROATE SODIUM 100 MG/ML IV SOLN
500.0000 mg | Freq: Every day | INTRAVENOUS | Status: DC
  Filled 2021-07-18: qty 5

## 2021-07-18 NOTE — Progress Notes (Signed)
Neurology Progress Note  S: Patient is unable to participate in ROS due to mental status.   O: Current vital signs: BP 140/79 (BP Location: Left Arm)   Pulse 71   Temp 97.9 F (36.6 C) (Oral)   Resp 16   SpO2 100%  Vital signs in last 24 hours: Temp:  [97.9 F (36.6 C)-100.5 F (38.1 C)] 97.9 F (36.6 C) (08/01 0753) Pulse Rate:  [61-101] 71 (08/01 0753) Resp:  [11-25] 16 (08/01 0753) BP: (121-175)/(67-162) 140/79 (08/01 0753) SpO2:  [97 %-100 %] 100 % (08/01 0753)  GENERAL: Cachetic, frail appearing elderly male, lying in bed in NAD.  HEENT: Normocephalic and atraumatic. LUNGS: Normal respiratory effort.  CV: RRR.  Ext: Contractures to all 4 extremities. Warm.  NEURO:  Mental Status: Eyes closed but opens them for a few seconds with name calling. He does not follow commands.  Speech/Language: No usable speech. Mumbling. Moans to noxious stimuli.  CN: Will not let examiner check eyes. Lying on his left side, so unable to check gaze preference. Sleepy. Extremities are rigid throughout. Face grossly symmetrical at rest.   Current Facility-Administered Medications:    acetaminophen (TYLENOL) tablet 650 mg, 650 mg, Oral, Q6H PRN **OR** acetaminophen (TYLENOL) suppository 650 mg, 650 mg, Rectal, Q6H PRN, Vashti Hey, MD, 650 mg at 07/17/21 1816   amLODipine (NORVASC) tablet 2.5 mg, 2.5 mg, Oral, Daily, Jamse Arn, Dewaine Oats Tublu, MD   atorvastatin (LIPITOR) tablet 20 mg, 20 mg, Oral, Daily, Jamse Arn, Kyra Searles, MD   bisacodyl (DULCOLAX) suppository 10 mg, 10 mg, Rectal, Daily PRN, Jamse Arn, Kyra Searles, MD   enoxaparin (LOVENOX) injection 40 mg, 40 mg, Subcutaneous, Q24H, Jamse Arn, Kyra Searles, MD, 40 mg at 07/17/21 1545   FLUoxetine (PROZAC) capsule 40 mg, 40 mg, Oral, Daily, Jamse Arn, Dewaine Oats Tublu, MD   metoprolol tartrate (LOPRESSOR) injection 2.5 mg, 2.5 mg, Intravenous, Q6H PRN, Hall, Carole N, DO   mirtazapine (REMERON) tablet 30 mg, 30 mg,  Oral, QHS, Chatterjee, Dewaine Oats Tublu, MD   potassium chloride SA (KLOR-CON) CR tablet 40 mEq, 40 mEq, Oral, Once, Vann, Jessica U, DO   sodium phosphate (FLEET) 7-19 GM/118ML enema 1 enema, 1 enema, Rectal, Once PRN, Jamse Arn, Kyra Searles, MD   valproate (DEPACON) 250 mg in dextrose 5 % 50 mL IVPB, 250 mg, Intravenous, Q6H, Bonnell Public Tublu, MD, Last Rate: 52.5 mL/hr at 07/18/21 0626, 250 mg at 07/18/21 0626  Pertinent Labs VA level 24 up to 29 today.   Imaging CTH-Cerebral atrophy with moderate similar ventriculomegaly. Ventriculomegaly could be secondary to atrophy or represent normal pressure hydrocephalus. Periventricular white matter hypoattenuation may be slightly progressive and could represent small vessel ischemic change or transependymal CSF resorption.  rEEG: This technically difficult study is suggestive of moderate diffuse encephalopathy, nonspecific etiology. No seizures or epileptiform discharges were seen throughout the recording.  Assessment: 75 yo male with seizure disorder who arrived yesterday after seizure activity at SNF. He was given Keppra load and placed on VPA '250mg'$  IV q6 hours. Because his level is still low today, we will raise his hs dose to '500mg'$  in AM and 750 QHS. Because he is not back to his mental baseline, we will check a cEEG with video. Given cachexia, will evaluate for vitamin deficiencies. Recheck VPA level tomorrow. Our concern is for nutrition if patient does not wake up enough to take po. Noticed his po meds have been held. This could be long post ictal state and secondary to Benzodiazepines given with seizures.  Impression: -Known  seizure history with breakthrough seizures likely due to low VA level.  -Encephalopathy likely prolonged post ictal phase and Benzo use.   Recommendations/Plan:  -Will continue VPA '250mg'$  IV QID with an additional '250mg'$  QHS. -Continue inpatient seizure precautions.  -Not taking po. Consider [facility] for  nutrition. Will defer to primary team. -Ativan '2mg'$  IV prn seizure lasting over 5 minutes and call neurology.  -Check TSH, Vitamin B12, ammonia.    NEUROHOSPITALIST ADDENDUM Performed a face to face diagnostic evaluation.   I have reviewed the contents of history and physical exam as documented by PA/ARNP/Resident and agree with above documentation.  I have discussed and formulated the above plan as documented. Edits to the note have been made as needed.  Impression 75M with hx of seizures on VPA, substance abuse, essential hypertension, type 2 diabetes mellitus, prostate cancer and depression, poor functional baseline and bed bound but conversant and oriented to self and place who presents with prolonged seizure at his SNF. Was seizing for 20 mins before EMS activated. Initially had R gaze deviation with facial twitching. Was given '2mg'$  Ativan and '3500mg'$  Keppra with resolution. VPA levels are low. He continues to be somnolent and encephalopathic and his mentation is not much changed compared to yesterday. Althou this could be post ictal, will get a cEEG to rule out subclinical seizures specially with low VPA levels. Will continue VPA at '250mg'$  QID with an additional '250mg'$  QHS. Will do IV until can transition to PO. Will check for encephalopathy labs including Thiamine and thiamine replacement in the meantime.  Donnetta Simpers, MD [facility] [phone]   If 7pm to 7am, please call on call as listed on AMION.

## 2021-07-18 NOTE — Progress Notes (Signed)
Started cEEG study.  Notified [facility] monitoring.  Tested patient event button. 

## 2021-07-18 NOTE — Evaluation (Signed)
Clinical/Bedside Swallow Evaluation Patient Details  Name: Brad [name] MRN: [id number] Date of Birth: 07-09-1946  Today's Date: 07/18/2021 Time: SLP Start Time (ACUTE ONLY): 1545 SLP Stop Time (ACUTE ONLY): 1600 SLP Time Calculation (min) (ACUTE ONLY): 15 min  Past Medical History:  Past Medical History:  Diagnosis Date   Anemia    Arthritis    Cancer ([place]) 2017   prostate   Depression    Diabetes mellitus without complication ([place])    History of stomach ulcers    Hypertension    Seizures ([place])    Substance abuse ([place])    Past Surgical History:  Past Surgical History:  Procedure Laterality Date   herniated disc     KNEE SURGERY Right    HPI:  Patient is a 75 y.o. male with PMH: dementia, HTN, DM2, prostate CA, seizure disorder, substance abuse who lives in a nursing home and is bedbound with multiple contractures who was sent in from a [facility] for seizures.  EMS was called and he was noted to have right gaze preference with facial twitching.  He was treated with Versed with some improvement and then seizures recurred again. He has h/o dysphagia and has been seen by ST services during previous admissions. MBS in 2020 revealed mild pharyngeal phase dysphagia but esophagram that same day revealed moderate esophageal dysmotility with no evidence of esophageal stricture or hiatal hernia.   Assessment / Plan / Recommendation Clinical Impression  Patient presents with a mild-moderate oropharyngeal dysphagia. He was alert but required cues to participate/engage in PO intake. SLP observed patient with three straw sips of thin liquids (water) and one bite of puree (applesauce). He exhibited oral transit delay with puree solids and suspected swallow initiation delay with thin liquids however no overt s/s aspiration or penetration observed and patient's voice remained clear. SLP is recommending to continue with NPO status but allow for meds crushed in puree and sips of  water PRN if patient alert. SLP will follow for readiness of full PO's. SLP Visit Diagnosis: Dysphagia, unspecified (R13.10)    Aspiration Risk  Moderate aspiration risk    Diet Recommendation NPO except meds;Other (Comment) (may have sips of water (straws ok) if alert)   Liquid Administration via: Straw;Cup Medication Administration: Crushed with puree Supervision: Staff to assist with self feeding;Full supervision/cueing for compensatory strategies    Other  Recommendations Oral Care Recommendations: Oral care QID;Staff/trained caregiver to provide oral care   Follow up Recommendations 24 hour supervision/assistance;Skilled Nursing facility      Frequency and Duration min 2x/week  1 week       Prognosis Prognosis for Safe Diet Advancement: Good      Swallow Study   General Date of Onset: 07/17/21 HPI: Patient is a 75 y.o. male with PMH: dementia, HTN, DM2, prostate CA, seizure disorder, substance abuse who lives in a nursing home and is bedbound with multiple contractures who was sent in from a [facility] for seizures.  EMS was called and he was noted to have right gaze preference with facial twitching.  He was treated with Versed with some improvement and then seizures recurred again. He has h/o dysphagia and has been seen by ST services during previous admissions. MBS in 2020 revealed mild pharyngeal phase dysphagia but esophagram that same day revealed moderate esophageal dysmotility with no evidence of esophageal stricture or hiatal hernia. Type of Study: Bedside Swallow Evaluation Previous Swallow Assessment: during previous admissions Diet Prior to this Study: NPO Temperature Spikes  Noted: No Respiratory Status: Room air History of Recent Intubation: No Behavior/Cognition: Alert;Requires cueing Oral Cavity Assessment: Within Functional Limits Oral Care Completed by SLP: Yes Oral Cavity - Dentition: Edentulous Self-Feeding Abilities: Total assist Patient  Positioning: Partially reclined Baseline Vocal Quality: Low vocal intensity Volitional Cough: Cognitively unable to elicit Volitional Swallow: Unable to elicit    Oral/Motor/Sensory Function Overall Oral Motor/Sensory Function: Other (comment) (patient unable to participate in full oral motor evaluation)   Ice Chips     Thin Liquid Thin Liquid: Impaired Presentation: Straw Pharyngeal  Phase Impairments: Suspected delayed Swallow Other Comments: No overt s/s aspiration or penetration observed    Nectar Thick     Honey Thick     Puree Puree: Impaired Oral Phase Functional Implications: Prolonged oral transit   Solid           Sonia Baller, MA, CCC-SLP Speech Therapy

## 2021-07-18 NOTE — Plan of Care (Signed)

## 2021-07-18 NOTE — Progress Notes (Signed)
Progress Note    Brad [name]  [id number] DOB: November 28, 1946  DOA: 07/17/2021 PCP: [name], DO    Brief Narrative:     Medical records reviewed and are as summarized below:  Brad [name] is an 75 y.o. male with history of dementia, HTN, DM2, prostate CA, seizure disorder, substance abuse who lives in a nursing home and is bedbound with multiple contractures who was sent in from SNF for seizures.  Assessment/Plan:   Principal Problem:   Seizures ([place]) Active Problems:   Hypertension   Diabetes ([place])   Pressure injury of skin   Dementia ([facility])   Bedbound  Altered Mental status Most likely secondary to postictal state after multiple repeated seizures at baseline he is able to talk to family and staff but is limited in his memory. -see below re: seizures   Seizure disorder  Patient being actively followed by neurology Patient has been treated with Ativan, Keppra and valproate Further management of seizure disorder per neurology -EEG pending   Goals for care Per admitter: Given his multiple contractures and dementia, discussed goals for care with with his sister [name] with whom he used to live with. We discussed issues around quality of life and effects of ongoing seizures on his mental functioning. She notes this was brought up at last hospitalization but was not ready to discuss that at that time. She states she will think about it but is not ready for palliative care consultation as yet. -will revisit once neurology recommendations are finalized   HTN Continue amlodipine   Anxiety and depression Continue mirtazapine and fluoxetine   Chronic pain D/c tramadol as lowers seizure threshold   Hypokalemia -replete IV as NPO  Hypoglycemia -monitor blood sugars  Family Communication/Anticipated D/C date and plan/Code Status   DVT prophylaxis: Lovenox ordered. Code Status: dnr  Disposition Plan: Status is: Inpatient  Remains inpatient  appropriate because:Inpatient level of care appropriate due to severity of illness  Dispo: The patient is from: SNF              Anticipated d/c is to: SNF              Patient currently is not medically stable to d/c.   Difficult to place patient No         Medical Consultants:   neurology  Subjective:   Minimally responsive   Objective:    Vitals:   07/17/21 2331 07/18/21 0335 07/18/21 0753 07/18/21 1131  BP: 129/67 134/72 140/79 (!) 149/102  Pulse: 82 73 71 72  Resp: '17 19 16 20  '$ Temp: 98.8 F (37.1 C) 98.4 F (36.9 C) 97.9 F (36.6 C) 98.3 F (36.8 C)  TempSrc: Oral Oral Oral   SpO2: 100% 100% 100% 100%    Intake/Output Summary (Last 24 hours) at 07/18/2021 1308 Last data filed at 07/17/2021 1700 Gross per 24 hour  Intake 99.7 ml  Output --  Net 99.7 ml   There were no vitals filed for this visit.  Exam: In bed with multiple contractures of joints Rrr Does not follow commands No increase work of breathing  Data Reviewed:   I have personally reviewed following labs and imaging studies:  Labs: Labs show the following:   Basic Metabolic Panel: Recent Labs  Lab 07/17/21 0659 07/17/21 1418 07/18/21 0237  NA 134*  --  135  K 4.2  --  3.4*  CL 99  --  100  CO2 26  --  27  GLUCOSE 75  --  67*  BUN 13  --  15  CREATININE 0.59* 0.66 0.64  CALCIUM 8.9  --  8.7*   GFR CrCl cannot be calculated (Unknown ideal weight.). Liver Function Tests: Recent Labs  Lab 07/17/21 0659  AST 20  ALT 14  ALKPHOS 62  BILITOT 0.8  PROT 6.7  ALBUMIN 2.6*   No results for input(s): LIPASE, AMYLASE in the last 168 hours. Recent Labs  Lab 07/18/21 0954  AMMONIA 16   Coagulation profile No results for input(s): INR, PROTIME in the last 168 hours.  CBC: Recent Labs  Lab 07/17/21 0659 07/17/21 1418 07/18/21 0237  WBC 8.3 12.5* 8.4  NEUTROABS 6.2  --   --   HGB 10.8* 10.1* 8.9*  HCT 34.6* 31.6* 28.4*  MCV 96.9 95.2 95.9  PLT 289 283 261   Cardiac  Enzymes: Recent Labs  Lab 07/18/21 0237  CKTOTAL 101   BNP (last 3 results) No results for input(s): PROBNP in the last 8760 hours. CBG: No results for input(s): GLUCAP in the last 168 hours. D-Dimer: No results for input(s): DDIMER in the last 72 hours. Hgb A1c: No results for input(s): HGBA1C in the last 72 hours. Lipid Profile: No results for input(s): CHOL, HDL, LDLCALC, TRIG, CHOLHDL, LDLDIRECT in the last 72 hours. Thyroid function studies: Recent Labs    07/18/21 0954  TSH 4.976*   Anemia work up: Recent Labs    07/18/21 0954  VITAMINB12 1,081*   Sepsis Labs: Recent Labs  Lab 07/17/21 0659 07/17/21 1418 07/18/21 0237  WBC 8.3 12.5* 8.4    Microbiology Recent Results (from the past 240 hour(s))  Resp Panel by RT-PCR (Flu A&B, Covid) Nasopharyngeal Swab     Status: None   Collection Time: 07/17/21  6:59 AM   Specimen: Nasopharyngeal Swab; Nasopharyngeal(NP) swabs in vial transport medium  Result Value Ref Range Status   SARS Coronavirus 2 by RT PCR NEGATIVE NEGATIVE Final    Comment: (NOTE) SARS-CoV-2 target nucleic acids are NOT DETECTED.  The SARS-CoV-2 RNA is generally detectable in upper respiratory specimens during the acute phase of infection. The lowest concentration of SARS-CoV-2 viral copies this assay can detect is 138 copies/mL. A negative result does not preclude SARS-Cov-2 infection and should not be used as the sole basis for treatment or other patient management decisions. A negative result may occur with  improper specimen collection/handling, submission of specimen other than nasopharyngeal swab, presence of viral mutation(s) within the areas targeted by this assay, and inadequate number of viral copies(<138 copies/mL). A negative result must be combined with clinical observations, patient history, and epidemiological information. The expected result is Negative.  Fact Sheet for Patients:   EntrepreneurPulse.com.au  Fact Sheet for Healthcare Providers:  IncredibleEmployment.be  This test is no t yet approved or cleared by the [place] FDA and  has been authorized for detection and/or diagnosis of SARS-CoV-2 by FDA under an Emergency Use Authorization (EUA). This EUA will remain  in effect (meaning this test can be used) for the duration of the COVID-19 declaration under Section 564(b)(1) of the Act, 21 U.S.C.section 360bbb-3(b)(1), unless the authorization is terminated  or revoked sooner.       Influenza A by PCR NEGATIVE NEGATIVE Final   Influenza B by PCR NEGATIVE NEGATIVE Final    Comment: (NOTE) The Xpert Xpress SARS-CoV-2/FLU/RSV plus assay is intended as an aid in the diagnosis of influenza from Nasopharyngeal swab specimens and should not be used as a sole  basis for treatment. Nasal washings and aspirates are unacceptable for Xpert Xpress SARS-CoV-2/FLU/RSV testing.  Fact Sheet for Patients: EntrepreneurPulse.com.au  Fact Sheet for Healthcare Providers: IncredibleEmployment.be  This test is not yet approved or cleared by the [place] FDA and has been authorized for detection and/or diagnosis of SARS-CoV-2 by FDA under an Emergency Use Authorization (EUA). This EUA will remain in effect (meaning this test can be used) for the duration of the COVID-19 declaration under Section 564(b)(1) of the Act, 21 U.S.C. section 360bbb-3(b)(1), unless the authorization is terminated or revoked.  Performed at [facility], [place] [address], [place], [place] [address]     Procedures and diagnostic studies:  CT Head Wo Contrast  Result Date: 07/17/2021 CLINICAL DATA:  Altered mental status.  Head trauma. EXAM: CT HEAD WITHOUT CONTRAST TECHNIQUE: Contiguous axial images were obtained from the base of the skull through the vertex without intravenous contrast. COMPARISON:  02/28/2021  FINDINGS: Brain: Age advanced cerebral atrophy. Moderate ventriculomegaly is similar. Moderate periventricular white matter hypoattenuation may be slightly progressive. Suspect remote right basal ganglia lacunar infarcts. No hemorrhage, intra-axial, or extra-axial fluid collection. Vascular: No hyperdense vessel or unexpected calcification. Intracranial atherosclerosis. Skull: No significant soft tissue swelling.  No skull fracture. Sinuses/Orbits: Normal imaged portions of the orbits and globes. Clear paranasal sinuses and mastoid air cells. Other: None. IMPRESSION: 1. Cerebral atrophy with moderate similar ventriculomegaly. Ventriculomegaly could be secondary to atrophy or represent normal pressure hydrocephalus. Periventricular white matter hypoattenuation may be slightly progressive and could represent small vessel ischemic change or transependymal CSF resorption. 2. Otherwise, no acute intracranial abnormality. Electronically Signed   By: Abigail Miyamoto M.D.   On: 07/17/2021 11:45   DG Chest Portable 1 View  Result Date: 07/17/2021 CLINICAL DATA:  Acute mental status change.  Seizure. EXAM: PORTABLE CHEST 1 VIEW COMPARISON:  February 28, 2021 FINDINGS: No pneumothorax. The cardiomediastinal silhouette is stable. The lungs are clear. No acute abnormalities are identified. IMPRESSION: No active disease. Electronically Signed   By: Dorise Bullion III M.D   On: 07/17/2021 09:16   EEG adult  Result Date: 07/17/2021 Lora Havens, MD     07/17/2021 11:00 AM Patient Name: AQUAN BARNHARD MRN: [id number] Epilepsy Attending: Lora Havens Referring Physician/Provider: Anibal Henderson, NP Date: 07/17/2021 Duration: 27.20 mins Patient history:  75yo M with h/o epilepsy presented with seizure described as gaze to right and facial twitching upon EMS arrival. EEG to evaluate for seizure Level of alertness:  awake AEDs during EEG study: LEV, VPA, Versed, Ativan Technical aspects: This EEG study was done with scalp  electrodes positioned according to the 10-20 International system of electrode placement. Electrical activity was acquired at a sampling rate of '500Hz'$  and reviewed with a high frequency filter of '70Hz'$  and a low frequency filter of '1Hz'$ . EEG data were recorded continuously and digitally stored. Description: EEG showed continuous generalized  3 to 6 Hz theta-delta slowing. Physiologic photic driving was not seen during photic stimulation.  Hyperventilation was not performed.   Of note, eeg was technically difficult due to significant myogenic artifact. ABNORMALITY - Continuous slow, generalized IMPRESSION: This technically difficult study is suggestive of moderate diffuse encephalopathy, nonspecific etiology. No seizures or epileptiform discharges were seen throughout the recording. If suspicion for interictal activity remains a concern, a prolonged study can be considered. Priyanka Barbra Sarks    Medications:    amLODipine  2.5 mg Oral Daily   atorvastatin  20 mg Oral Daily   enoxaparin (  LOVENOX) injection  40 mg Subcutaneous Q24H   FLUoxetine  40 mg Oral Daily   mirtazapine  30 mg Oral QHS   thiamine injection  100 mg Intravenous Daily   Or   thiamine  100 mg Oral Daily   Continuous Infusions:  potassium chloride 10 mEq (07/18/21 1216)   valproate sodium 250 mg (07/18/21 1110)   valproate sodium       LOS: 1 day   Geradine Girt  [facility]   How to contact the [facility] Attending or Consulting provider [place] or covering provider during after hours [place], for this patient?  Check the care team in [facility] and look for a) attending/consulting TRH provider listed and b) the [facility] team listed Log into [email or web address] and use [place]'s universal password to access. If you do not have the password, please contact the hospital operator. Locate the [facility] provider you are looking for under [facility] and page to a number that you can be directly reached. If you still have difficulty reaching  the provider, please page the [facility] (Director on Call) for the Hospitalists listed on amion for assistance.  07/18/2021, 1:08 PM

## 2021-07-19 DIAGNOSIS — I10 Essential (primary) hypertension: Secondary | ICD-10-CM | POA: Diagnosis not present

## 2021-07-19 DIAGNOSIS — R569 Unspecified convulsions: Secondary | ICD-10-CM | POA: Diagnosis not present

## 2021-07-19 LAB — GLUCOSE, CAPILLARY
Glucose-Capillary: 139 mg/dL — ABNORMAL HIGH (ref 70–99)
Glucose-Capillary: 149 mg/dL — ABNORMAL HIGH (ref 70–99)
Glucose-Capillary: 61 mg/dL — ABNORMAL LOW (ref 70–99)
Glucose-Capillary: 91 mg/dL (ref 70–99)
Glucose-Capillary: 99 mg/dL (ref 70–99)

## 2021-07-19 LAB — VALPROIC ACID LEVEL: Valproic Acid Lvl: 48 ug/mL — ABNORMAL LOW (ref 50.0–100.0)

## 2021-07-19 MED ORDER — DEXTROSE 50 % IV SOLN
INTRAVENOUS | Status: AC
  Filled 2021-07-19: qty 50

## 2021-07-19 MED ORDER — VALPROATE SODIUM 100 MG/ML IV SOLN
250.0000 mg | Freq: Two times a day (BID) | INTRAVENOUS | Status: DC
  Administered 2021-07-19 – 2021-07-20 (×2): 250 mg via INTRAVENOUS
  Filled 2021-07-19 (×3): qty 2.5

## 2021-07-19 MED ORDER — DEXTROSE 50 % IV SOLN
1.0000 | Freq: Four times a day (QID) | INTRAVENOUS | Status: DC | PRN
  Administered 2021-07-22 – 2021-07-24 (×5): 50 mL via INTRAVENOUS
  Filled 2021-07-19 (×6): qty 50

## 2021-07-19 MED ORDER — VALPROATE SODIUM 100 MG/ML IV SOLN
500.0000 mg | Freq: Two times a day (BID) | INTRAVENOUS | Status: DC
  Administered 2021-07-19 – 2021-07-20 (×2): 500 mg via INTRAVENOUS
  Filled 2021-07-19 (×3): qty 5

## 2021-07-19 MED ORDER — LEVETIRACETAM IN NACL 1500 MG/100ML IV SOLN: 1500.0000 mg | Freq: Once | INTRAVENOUS | Status: DC

## 2021-07-19 MED ORDER — DEXTROSE 50 % IV SOLN
1.0000 | Freq: Once | INTRAVENOUS | Status: AC
  Administered 2021-07-19: 50 mL via INTRAVENOUS

## 2021-07-19 NOTE — Progress Notes (Signed)
LTM maint complete - no skin breakdown under: Fp2 F4 F8 reddness under A2  , A2 relocated with in range.[facility] monitored, Event button test confirmed by [facility].

## 2021-07-19 NOTE — Progress Notes (Addendum)
Progress Note    Brad [name]  [id number] DOB: 28-Jun-1946  DOA: 07/17/2021 PCP: [name], DO    Brief Narrative:     Medical records reviewed and are as summarized below:  Brad [name] is an 75 y.o. male with history of dementia, HTN, DM2, prostate CA, seizure disorder, substance abuse who lives in a nursing home and is bedbound with multiple contractures who was sent in from SNF for seizures.   Assessment/Plan:   Principal Problem:   Seizures ([place]) Active Problems:   Hypertension   Diabetes ([place])   Dementia ([place])   Bedbound    Altered Mental status Most likely secondary to postictal state after multiple repeated seizures at baseline he is able to talk to family and staff but is limited in his memory. -see below re: seizures   Seizure disorder  Patient being actively followed by neurology Patient has been treated with Ativan, Keppra and valproate Further management of seizure disorder per neurology -EEG: This study showed seizures without clinical signs arising from left frontal region, on 07/18/2021 at 2128, 2216 and on 07/19/2021 at 0657.  There is also evidence of cortical dysfunction in left hemisphere likely secondary to underlying structural abnormality, postictal state.  Additionally there is moderate diffuse encephalopathy, nonspecific etiology but likely secondary to seizures.   Goals for care Per admitter: Given his multiple contractures and dementia, discussed goals for care with with his sister Brad [name] with whom he used to live with. We discussed issues around quality of life and effects of ongoing seizures on his mental functioning. She notes this was brought up at last hospitalization but was not ready to discuss that at that time. She states she will think about it but is not ready for palliative care consultation as yet. -will revisit once neurology recommendations are finalized   HTN Continue amlodipine   Anxiety and  depression Continue mirtazapine and fluoxetine   Chronic pain D/c tramadol as lowers seizure threshold   Hypokalemia -replete   High TSH -check free t4  Hypoglycemia -monitor blood sugars -IV d50-- if continues to not eat, will add dextrose IVF   Family Communication/Anticipated D/C date and plan/Code Status   DVT prophylaxis: Lovenox ordered. Code Status: dnr  Disposition Plan: Status is: Inpatient Was able to speak with sister at [place]-- she is agreeable for palliative care consult-- she was updated on his condition   Remains inpatient appropriate because:Inpatient level of care appropriate due to severity of illness  Dispo: The patient is from: SNF              Anticipated d/c is to: SNF              Patient currently is not medically stable to d/c.   Difficult to place patient No         Medical Consultants:   neurology  Subjective:   Yelling out  Objective:    Vitals:   07/19/21 0900 07/19/21 1002 07/19/21 1154 07/19/21 1526  BP:  (!) 148/88 140/70 (!) 160/84  Pulse:  70 69 75  Resp:   16 18  Temp:   97.7 F (36.5 C) 98.3 F (36.8 C)  TempSrc:   Oral Oral  SpO2:   100% 100%  Weight: 57 kg       Intake/Output Summary (Last 24 hours) at 07/19/2021 1600 Last data filed at 07/19/2021 1329 Gross per 24 hour  Intake 465.5 ml  Output --  Net 465.5 ml   Brad [name]  Weights   07/19/21 0900  Weight: 57 kg    Exam: In bed, yelling out, not following commands Rrr No increased work of breathing  Data Reviewed:   I have personally reviewed following labs and imaging studies:  Labs: Labs show the following:   Basic Metabolic Panel: Recent Labs  Lab 07/17/21 0659 07/17/21 1418 07/18/21 0237  NA 134*  --  135  K 4.2  --  3.4*  CL 99  --  100  CO2 26  --  27  GLUCOSE 75  --  67*  BUN 13  --  15  CREATININE 0.59* 0.66 0.64  CALCIUM 8.9  --  8.7*   GFR Estimated Creatinine Clearance: 66.3 mL/min (by C-G formula based on SCr of 0.64  mg/dL). Liver Function Tests: Recent Labs  Lab 07/17/21 0659  AST 20  ALT 14  ALKPHOS 62  BILITOT 0.8  PROT 6.7  ALBUMIN 2.6*   No results for input(s): LIPASE, AMYLASE in the last 168 hours. Recent Labs  Lab 07/18/21 0954  AMMONIA 16   Coagulation profile No results for input(s): INR, PROTIME in the last 168 hours.  CBC: Recent Labs  Lab 07/17/21 0659 07/17/21 1418 07/18/21 0237  WBC 8.3 12.5* 8.4  NEUTROABS 6.2  --   --   HGB 10.8* 10.1* 8.9*  HCT 34.6* 31.6* 28.4*  MCV 96.9 95.2 95.9  PLT 289 283 261   Cardiac Enzymes: Recent Labs  Lab 07/18/21 0237  CKTOTAL 101   BNP (last 3 results) No results for input(s): PROBNP in the last 8760 hours. CBG: Recent Labs  Lab 07/19/21 1005 07/19/21 1047 07/19/21 1154  GLUCAP 61* 149* 139*   D-Dimer: No results for input(s): DDIMER in the last 72 hours. Hgb A1c: No results for input(s): HGBA1C in the last 72 hours. Lipid Profile: No results for input(s): CHOL, HDL, LDLCALC, TRIG, CHOLHDL, LDLDIRECT in the last 72 hours. Thyroid function studies: Recent Labs    07/18/21 0954  TSH 4.976*   Anemia work up: Recent Labs    07/18/21 0954  VITAMINB12 1,081*   Sepsis Labs: Recent Labs  Lab 07/17/21 0659 07/17/21 1418 07/18/21 0237  WBC 8.3 12.5* 8.4    Microbiology Recent Results (from the past 240 hour(s))  Resp Panel by RT-PCR (Flu A&B, Covid) Nasopharyngeal Swab     Status: None   Collection Time: 07/17/21  6:59 AM   Specimen: Nasopharyngeal Swab; Nasopharyngeal(NP) swabs in vial transport medium  Result Value Ref Range Status   SARS Coronavirus 2 by RT PCR NEGATIVE NEGATIVE Final    Comment: (NOTE) SARS-CoV-2 target nucleic acids are NOT DETECTED.  The SARS-CoV-2 RNA is generally detectable in upper respiratory specimens during the acute phase of infection. The lowest concentration of SARS-CoV-2 viral copies this assay can detect is 138 copies/mL. A negative result does not preclude  SARS-Cov-2 infection and should not be used as the sole basis for treatment or other patient management decisions. A negative result may occur with  improper specimen collection/handling, submission of specimen other than nasopharyngeal swab, presence of viral mutation(s) within the areas targeted by this assay, and inadequate number of viral copies(<138 copies/mL). A negative result must be combined with clinical observations, patient history, and epidemiological information. The expected result is Negative.  Fact Sheet for Patients:  EntrepreneurPulse.com.au  Fact Sheet for Healthcare Providers:  IncredibleEmployment.be  This test is no t yet approved or cleared by the [place] FDA and  has been authorized for detection and/or  diagnosis of SARS-CoV-2 by FDA under an Emergency Use Authorization (EUA). This EUA will remain  in effect (meaning this test can be used) for the duration of the COVID-19 declaration under Section 564(b)(1) of the Act, 21 U.S.C.section 360bbb-3(b)(1), unless the authorization is terminated  or revoked sooner.       Influenza A by PCR NEGATIVE NEGATIVE Final   Influenza B by PCR NEGATIVE NEGATIVE Final    Comment: (NOTE) The Xpert Xpress SARS-CoV-2/FLU/RSV plus assay is intended as an aid in the diagnosis of influenza from Nasopharyngeal swab specimens and should not be used as a sole basis for treatment. Nasal washings and aspirates are unacceptable for Xpert Xpress SARS-CoV-2/FLU/RSV testing.  Fact Sheet for Patients: EntrepreneurPulse.com.au  Fact Sheet for Healthcare Providers: IncredibleEmployment.be  This test is not yet approved or cleared by the [place] FDA and has been authorized for detection and/or diagnosis of SARS-CoV-2 by FDA under an Emergency Use Authorization (EUA). This EUA will remain in effect (meaning this test can be used) for the duration of  the COVID-19 declaration under Section 564(b)(1) of the Act, 21 U.S.C. section 360bbb-3(b)(1), unless the authorization is terminated or revoked.  Performed at [facility], [place] [address] [place], [place] [address]     Procedures and diagnostic studies:  Overnight EEG with video  Result Date: 07/19/2021 Lora Havens, MD     07/19/2021  9:53 AM Patient Name: Brad [name] MRN: [id number] Epilepsy Attending: Lora Havens Referring Physician/Provider: Clance Boll, NP Duration: 8/1/20221158 to 07/19/2021 0945  Patient history:  75yo M with h/o epilepsy presented with seizure described as gaze to right and facial twitching upon EMS arrival. EEG to evaluate for seizure  Level of alertness:  awake asleep  AEDs during EEG study: VPA  Technical aspects: This EEG study was done with scalp electrodes positioned according to the 10-20 International system of electrode placement. Electrical activity was acquired at a sampling rate of '500Hz'$  and reviewed with a high frequency filter of '70Hz'$  and a low frequency filter of '1Hz'$ . EEG data were recorded continuously and digitally stored.  Description: During awake state, no clear posterior dominant rhythm was seen. Sleep was characterized by sleep spindles (12 to '14Hz'$ ) EEG showed continuous generalized and lateralized left hemisphere 3 to 6 Hz theta-delta slowing.  Frequent spikes and polyspikes were noted in left frontal region.  Seizures without clinical signs were noted originating from left frontal region on 07/18/2021 at 2128, 2216 and on 07/19/2021 at 0657.  ABNORMALITY - Seizure without clinical sign, left frontal region - Continuous slow, generalized and lateralized left hemisphere  IMPRESSION: This study showed seizures without clinical signs arising from left frontal region, on 07/18/2021 at 2128, 2216 and on 07/19/2021 at 0657.  There is also evidence of cortical dysfunction in left hemisphere likely secondary to underlying structural abnormality,  postictal state.  Additionally there is moderate diffuse encephalopathy, nonspecific etiology but likely secondary to seizures.  Priyanka Barbra Sarks    Medications:    amLODipine  2.5 mg Oral Daily   atorvastatin  20 mg Oral Daily   enoxaparin (LOVENOX) injection  40 mg Subcutaneous Q24H   FLUoxetine  40 mg Oral Daily   mirtazapine  30 mg Oral QHS   thiamine injection  100 mg Intravenous Daily   Or   thiamine  100 mg Oral Daily   Continuous Infusions:  valproate sodium     valproate sodium 500 mg (07/19/21 1453)     LOS: 2 days  Geradine Girt  [facility]   How to contact the [facility] Attending or Consulting provider [place] or covering provider during after hours [place], for this patient?  Check the care team in [facility] and look for a) attending/consulting TRH provider listed and b) the [facility] team listed Log into [email or web address] and use [place]'s universal password to access. If you do not have the password, please contact the hospital operator. Locate the [facility] provider you are looking for under [facility] and page to a number that you can be directly reached. If you still have difficulty reaching the provider, please page the [facility] (Director on Call) for the Hospitalists listed on amion for assistance.  07/19/2021, 4:00 PM

## 2021-07-19 NOTE — Progress Notes (Signed)
Neurology Progress Note  S: Patient can not participate in ROS due to mental status. SLP note states that patient was awake for evaluation, but still needs to be NPO, but crush meds in puree. RN states he is awake, but speech is unintelligible.   O: Current vital signs: BP (!) 152/89 (BP Location: Left Arm)   Pulse 74   Temp 97.7 F (36.5 C) (Oral)   Resp 20   Wt 57 kg   SpO2 97%   BMI 16.58 kg/m  Vital signs in last 24 hours: Temp:  [97.7 F (36.5 C)-98.4 F (36.9 C)] 97.7 F (36.5 C) (08/02 0747) Pulse Rate:  [72-98] 74 (08/02 0747) Resp:  [18-20] 20 (08/02 0747) BP: (149-178)/(63-102) 152/89 (08/02 0747) SpO2:  [97 %-100 %] 97 % (08/02 0747) Weight:  [57 kg] 57 kg (08/02 0900)  GENERAL: Frail, chronically ill appearing male in NAD. Awake.  HEENT: Normocephalic and atraumatic. LUNGS: Normal respiratory effort.  CV: RRR. Ext: warm. Contacted x 4 extremitis.  Psych: calm.   NEURO:  Mental Status: awake, speech is unintelligible. Does not follow commands. He withdraws BLE to noxious stimuli, but only slight movement. No spontaneous purposeful movements.   Cranial Nerves:  PERRL with arcus senilis. Contractures noted to all extremities with rigid tone.   Medications  Current Facility-Administered Medications:    acetaminophen (TYLENOL) tablet 650 mg, 650 mg, Oral, Q6H PRN **OR** acetaminophen (TYLENOL) suppository 650 mg, 650 mg, Rectal, Q6H PRN, Vashti Hey, MD, 650 mg at 07/17/21 1816   amLODipine (NORVASC) tablet 2.5 mg, 2.5 mg, Oral, Daily, Jamse Arn, Dewaine Oats Tublu, MD   atorvastatin (LIPITOR) tablet 20 mg, 20 mg, Oral, Daily, Jamse Arn, Kyra Searles, MD   bisacodyl (DULCOLAX) suppository 10 mg, 10 mg, Rectal, Daily PRN, Jamse Arn, Kyra Searles, MD   enoxaparin (LOVENOX) injection 40 mg, 40 mg, Subcutaneous, Q24H, Jamse Arn, Kyra Searles, MD, 40 mg at 07/18/21 1336   FLUoxetine (PROZAC) capsule 40 mg, 40 mg, Oral, Daily, Jamse Arn, Dewaine Oats Tublu,  MD   metoprolol tartrate (LOPRESSOR) injection 2.5 mg, 2.5 mg, Intravenous, Q6H PRN, [place] Crane, Carole N, DO, 2.5 mg at 07/19/21 [id number]   mirtazapine (REMERON) tablet 30 mg, 30 mg, Oral, QHS, Chatterjee, Dewaine Oats Tublu, MD   sodium phosphate (FLEET) 7-19 GM/118ML enema 1 enema, 1 enema, Rectal, Once PRN, Bonnell Public Tublu, MD   thiamine (B-1) injection 100 mg, 100 mg, Intravenous, Daily, 100 mg at 07/19/21 0907 **OR** thiamine tablet 100 mg, 100 mg, Oral, Daily, Donnetta Simpers, MD   valproate (DEPACON) 250 mg in dextrose 5 % 50 mL IVPB, 250 mg, Intravenous, TID AC, Kirby-Graham, Karsten Fells, NP, Last Rate: 52.5 mL/hr at 07/19/21 0745, 250 mg at 07/19/21 0745   valproate (DEPACON) 500 mg in dextrose 5 % 50 mL IVPB, 500 mg, Intravenous, QHS, Kirby-Graham, Karsten Fells, NP, Stopped at 07/18/21 2302  Pertinent Labs Vit B12 1081.   TSH 4.976.    VA level 48 (39).  No new imaging.   EEG 07/17/21. This technically difficult study is suggestive of moderate diffuse encephalopathy, nonspecific etiology. No seizures or epileptiform discharges were seen throughout the recording.  cEEG 07/19/21: This study showed seizures without clinical signs arising from left frontal region, on 07/18/2021 at 2128, 2216 and on 07/19/2021 at 0657.  There is also evidence of cortical dysfunction in left hemisphere likely secondary to underlying structural abnormality, postictal state.  Additionally there is moderate diffuse encephalopathy, nonspecific etiology but likely secondary to seizures.  Assessment: 76M with hx of seizures on VPA, substance  abuse, essential hypertension, type 2 diabetes mellitus, prostate cancer and depression, poor functional baseline and bed bound but conversant and oriented to self and place at baseline who presents with prolonged seizure at his SNF. Was seizing for 20 mins before EMS activated. Initially had R gaze deviation with facial twitching. Was given '2mg'$  Ativan and '3500mg'$  Keppra with resolution. His VPA  levels were low. He was started on cEEG due to persistent somnolence. Was found to have seizures without clinical signs arising from left frontal region, on 07/18/2021 at 2128, 2216 and on 07/19/2021 at 0657. VPA levels are still low despite uptitration of dosing so room to go up until seizure control is achieved.  He is more interactive today and vocalizing but speech is difficult to understand. He does not track my face or look at me. He does not follow commands. His infectious workup has been negative.   Impression: -Seizure disorder.  -VPA level low but improving.  -Dementia.   Recommendations/Plan:  - Discussed with pharmacy, will increase VPA to a total of '1500mg'$ /day divided into Q6 hours dosing as follows. VPA '250mg'$  IV @ 1000 and 2200, VPA '500mg'$  IV @ 1400 and 0200. - Check VPA level in am.  - Continue cEEG - Seizure precautions.  -Ativan '2mg'$  IV prn seizure lasting over 5 mins and call Neurologist.   Pt seen by Clance Boll, MSN, APN-BC/Nurse Practitioner/Neuro and later by MD. Note and plan to be edited as needed by MD.  Pager: [phone]

## 2021-07-19 NOTE — Progress Notes (Addendum)
Hypoglycemic Event  CBG: 61  Treatment: D50 50 mL (25 gm)  Symptoms: None  Follow-up CBG: Time: 1047  CBG Result: 149  Possible Reasons for Event: NPO  Comments/MD notified: Dr. Eliseo Squires aware, ordered D50. CBG's ordered Q6.    Brad [name]

## 2021-07-19 NOTE — Procedures (Addendum)
Patient Name: Brad [name]  MRN: [id number]  Epilepsy Attending: Lora Havens  Referring Physician/Provider: Clance Boll, NP Duration: 8/1/20221158 to 07/19/2021 1158   Patient history:  75yo M with h/o epilepsy presented with seizure described as gaze to right and facial twitching upon EMS arrival. EEG to evaluate for seizure   Level of alertness:  awake asleep   AEDs during EEG study: VPA   Technical aspects: This EEG study was done with scalp electrodes positioned according to the 10-20 International system of electrode placement. Electrical activity was acquired at a sampling rate of '500Hz'$  and reviewed with a high frequency filter of '70Hz'$  and a low frequency filter of '1Hz'$ . EEG data were recorded continuously and digitally stored.   Description: During awake state, no clear posterior dominant rhythm was seen. Sleep was characterized by sleep spindles (12 to '14Hz'$ ) EEG showed continuous generalized and lateralized left hemisphere 3 to 6 Hz theta-delta slowing.  Frequent spikes and polyspikes were noted in left frontal region.  Seizures without clinical signs were noted originating from left frontal region on 07/18/2021 at 2128, 2216 and on 07/19/2021 at 0657.    ABNORMALITY - Seizure without clinical sign, left frontal region - Continuous slow, generalized and lateralized left hemisphere   IMPRESSION: This study showed seizures without clinical signs arising from left frontal region, on 07/18/2021 at 2128, 2216 and on 07/19/2021 at 0657.  There is also evidence of cortical dysfunction in left hemisphere likely secondary to underlying structural abnormality, postictal state.  Additionally there is moderate diffuse encephalopathy, nonspecific etiology but likely secondary to seizures.   Lucette Kratz Barbra Sarks

## 2021-07-19 NOTE — Progress Notes (Signed)
Performed EEG maintenance.  No skin breakdown observed at electrode sites F4, Fp1.

## 2021-07-19 NOTE — Progress Notes (Signed)
Change valproic acid to specific schedule per neurology:  Valproic acid '250mg'$  IV q8a8p Valproic acid '500mg'$  IV q2a2p  Onnie Boer, PharmD, BCIDP, AAHIVP, CPP Infectious Disease Pharmacist 07/19/2021 11:28 AM

## 2021-07-19 NOTE — Progress Notes (Signed)
  Speech Language Pathology Treatment: Dysphagia  Patient Details Name: Brad [name] MRN: [id number] DOB: 10-31-1946 Today's Date: 07/19/2021 Time: [id number] SLP Time Calculation (min) (ACUTE ONLY): 11 min  Assessment / Plan / Recommendation Clinical Impression  Pt is alert today and verbalizing throughout session, although most of what he is saying is difficult to decipher. His mentation still seems to be his biggest barrier to adequate oral nutrition, but he did consume more today than he did during the eval per note. He needs cues for bolus awareness when he first starts taking POs and also when he makes transitions between purees and thin liquids. When focusing just on liquids his automaticity improves as trials progress. Frequent eructation is noted, perhaps consistent with h/o esophageal dysmotility, but only a single cough is noted. Recommend starting with a full liquid diet with full supervision. Will f/u to see if he can at least return to pureed diet that has been recommended in the past as his mentation clears.    HPI HPI: Patient is a 75 y.o. male with PMH: dementia, HTN, DM2, prostate CA, seizure disorder, substance abuse who lives in a nursing home and is bedbound with multiple contractures who was sent in from a [facility] for seizures.  EMS was called and he was noted to have right gaze preference with facial twitching.  He was treated with Versed with some improvement and then seizures recurred again. He has h/o dysphagia and has been seen by ST services during previous admissions. MBS in 2020 revealed mild pharyngeal phase dysphagia but esophagram that same day revealed moderate esophageal dysmotility with no evidence of esophageal stricture or hiatal hernia.      SLP Plan  Continue with current plan of care       Recommendations  Diet recommendations: Thin liquid (full liquid diet) Liquids provided via: Cup;Straw Medication Administration: Crushed with  puree Supervision: Full supervision/cueing for compensatory strategies;Staff to assist with self feeding Compensations: Minimize environmental distractions;Slow rate;Small sips/bites Postural Changes and/or Swallow Maneuvers: Seated upright 90 degrees;Upright 30-60 min after meal                Oral Care Recommendations: Oral care BID Follow up Recommendations: Skilled Nursing facility SLP Visit Diagnosis: Dysphagia, unspecified (R13.10) Plan: Continue with current plan of care       GO                Osie Bond., M.A. [facility] Pager [phone] Office [phone]  07/19/2021, 11:31 AM

## 2021-07-19 NOTE — Progress Notes (Signed)
   07/19/21 0429  Vitals  Temp 98.2 F (36.8 C)  Temp Source Oral  BP (!) 164/63  MAP (mmHg) 95  BP Location Left Arm  BP Method Automatic  Patient Position (if appropriate) Lying  Pulse Rate 77  Resp 20  Level of Consciousness  Level of Consciousness Alert  MEWS COLOR  MEWS Score Color Green  Oxygen Therapy  SpO2 100 %  O2 Device Room Air  MEWS Score  MEWS Temp 0  MEWS Systolic 0  MEWS Pulse 0  MEWS RR 0  MEWS LOC 0  MEWS Score 0  Provider Notification  Provider Name/Title Dr. Marlowe Sax  Date Provider Notified 07/19/21  Time Provider Notified [phone]  Notification Type Page  Notification Reason Other (Comment) (prn not effective)  Provider response No new orders (but to monitor pt and give prn at when pt can receive dose if needed.)  Date of Provider Response 07/19/21  Time of Provider Response [phone]

## 2021-07-20 DIAGNOSIS — R569 Unspecified convulsions: Secondary | ICD-10-CM | POA: Diagnosis not present

## 2021-07-20 DIAGNOSIS — Z7189 Other specified counseling: Secondary | ICD-10-CM | POA: Diagnosis not present

## 2021-07-20 DIAGNOSIS — Z66 Do not resuscitate: Secondary | ICD-10-CM | POA: Diagnosis not present

## 2021-07-20 DIAGNOSIS — E119 Type 2 diabetes mellitus without complications: Secondary | ICD-10-CM

## 2021-07-20 DIAGNOSIS — I10 Essential (primary) hypertension: Secondary | ICD-10-CM | POA: Diagnosis not present

## 2021-07-20 DIAGNOSIS — Z515 Encounter for palliative care: Secondary | ICD-10-CM | POA: Diagnosis not present

## 2021-07-20 DIAGNOSIS — Z7401 Bed confinement status: Secondary | ICD-10-CM

## 2021-07-20 LAB — BASIC METABOLIC PANEL
Anion gap: 11 (ref 5–15)
BUN: 8 mg/dL (ref 8–23)
CO2: 24 mmol/L (ref 22–32)
Calcium: 8.8 mg/dL — ABNORMAL LOW (ref 8.9–10.3)
Chloride: 101 mmol/L (ref 98–111)
Creatinine, Ser: 0.5 mg/dL — ABNORMAL LOW (ref 0.61–1.24)
GFR, Estimated: >60 mL/min (ref >60)
Glucose, Bld: 113 mg/dL — ABNORMAL HIGH (ref 70–99)
Potassium: 3.5 mmol/L (ref 3.5–5.1)
Sodium: 136 mmol/L (ref 135–145)

## 2021-07-20 LAB — GLUCOSE, CAPILLARY
Glucose-Capillary: 89 mg/dL (ref 70–99)
Glucose-Capillary: 181 mg/dL — ABNORMAL HIGH (ref 70–99)
Glucose-Capillary: 157 mg/dL — ABNORMAL HIGH (ref 70–99)

## 2021-07-20 LAB — VALPROIC ACID LEVEL: Valproic Acid Lvl: 60 ug/mL (ref 50.0–100.0)

## 2021-07-20 LAB — AMMONIA: Ammonia: 36 umol/L — ABNORMAL HIGH (ref 9–35)

## 2021-07-20 LAB — T4, FREE: Free T4: 1.01 ng/dL (ref 0.61–1.12)

## 2021-07-20 MED ORDER — ACETAMINOPHEN 500 MG PO TABS
500.0000 mg | ORAL_TABLET | Freq: Three times a day (TID) | ORAL | Status: DC
  Administered 2021-07-20 – 2021-07-28 (×24): 500 mg via ORAL
  Filled 2021-07-20 (×24): qty 1

## 2021-07-20 MED ORDER — SODIUM CHLORIDE 0.9 % IV SOLN
2000.0000 mg | Freq: Once | INTRAVENOUS | Status: AC
  Administered 2021-07-20: 2000 mg via INTRAVENOUS
  Filled 2021-07-20: qty 20

## 2021-07-20 MED ORDER — DIVALPROEX SODIUM 125 MG PO CSDR
500.0000 mg | DELAYED_RELEASE_CAPSULE | Freq: Three times a day (TID) | ORAL | Status: DC
  Administered 2021-07-20 – 2021-07-28 (×24): 500 mg via ORAL
  Filled 2021-07-20 (×24): qty 4

## 2021-07-20 MED ORDER — NYSTATIN 100000 UNIT/ML MT SUSP
5.0000 mL | Freq: Four times a day (QID) | OROMUCOSAL | Status: DC
  Administered 2021-07-20 – 2021-07-28 (×31): 500000 [IU] via ORAL
  Filled 2021-07-20 (×28): qty 5

## 2021-07-20 MED ORDER — LEVETIRACETAM IN NACL 1000 MG/100ML IV SOLN: 1000.0000 mg | Freq: Once | INTRAVENOUS | Status: DC

## 2021-07-20 MED ORDER — LEVETIRACETAM 500 MG PO TABS
500.0000 mg | ORAL_TABLET | Freq: Two times a day (BID) | ORAL | Status: DC
  Administered 2021-07-20 – 2021-07-25 (×10): 500 mg via ORAL
  Filled 2021-07-20 (×10): qty 1

## 2021-07-20 MED ORDER — DIVALPROEX SODIUM 250 MG PO DR TAB
500.0000 mg | DELAYED_RELEASE_TABLET | Freq: Three times a day (TID) | ORAL | Status: DC
  Filled 2021-07-20: qty 2

## 2021-07-20 NOTE — NC FL2 (Signed)
[place] LEVEL OF CARE SCREENING TOOL     IDENTIFICATION  Patient Name: Brad [name] Birthdate: 10-28-1946 Sex: male Admission Date (Current Location): 07/17/2021  [facility] and [place] Number:  Herbalist and Address:  [facility], [place] [address], [place], [place] [address]      Provider Number: [id number]  Attending Physician Name and Address:  Flora Lipps, MD  Relative Name and Phone Number:       Current Level of Care: Hospital Recommended Level of Care: [place] Prior Approval Number:    Date Approved/Denied:   PASRR Number:    Discharge Plan: SNF    Current Diagnoses: Patient Active Problem List   Diagnosis Date Noted   Seizures ([place]) 07/17/2021   Bedbound 07/17/2021   Dementia ([place]) 03/01/2021   COVID-19 03/01/2021   Femur neck fracture ([place]) 02/28/2021   Protein-calorie malnutrition, severe 12/30/2020   Cachexia ([place]) 12/25/2020   Pressure injury of skin 12/06/2020   Seizure ([place]) 12/05/2020   Diabetes ([place]) 12/05/2020   Breakthrough seizure ([place]) 12/05/2020   Cervical spinal stenosis 12/14/2017   Abnormal CT scan, neck 12/14/2017   History of stroke 12/12/2017   Hyperlipidemia 12/12/2017   Seizure disorder ([place]) 12/12/2017   KNEE PAIN, RIGHT 06/11/2009   NECK PAIN 06/11/2009   Hypertension 02/13/2008   DENTAL CARIES 02/11/2008    Orientation RESPIRATION BLADDER Height & Weight      (disoriented x 4)  Normal Incontinent Weight: 125 lb 10.6 oz (57 kg) Height:   (pt contracted and not good historian to ask)  BEHAVIORAL SYMPTOMS/MOOD NEUROLOGICAL BOWEL NUTRITION STATUS    Convulsions/Seizures Incontinent Diet (see DC summary)  AMBULATORY STATUS COMMUNICATION OF NEEDS Skin   Total Care Non-Verbally PU Stage and Appropriate Care     PU Stage 3 Dressing:  (right heel, wet to dry dressing, change PRN)                 Personal Care Assistance Level of Assistance  Bathing,  Feeding, Dressing Bathing Assistance: Maximum assistance Feeding assistance: Maximum assistance Dressing Assistance: Maximum assistance     Functional Limitations Info  Speech     Speech Info: Impaired (incomprehensible)    SPECIAL CARE FACTORS FREQUENCY                       Contractures Contractures Info: Not present    Additional Factors Info  Code Status, Allergies, Psychotropic Code Status Info: DNR Allergies Info: NKA Psychotropic Info: Prozac '40mg'$  daily, Remeron '30mg'$  daily at bed         Current Medications (07/20/2021):  This is the current hospital active medication list Current Facility-Administered Medications  Medication Dose Route Frequency Provider Last Rate Last Admin   acetaminophen (TYLENOL) tablet 650 mg  650 mg Oral Q6H PRN Bonnell Public Tublu, MD   650 mg at 07/19/21 2052   Or   acetaminophen (TYLENOL) suppository 650 mg  650 mg Rectal Q6H PRN Bonnell Public Tublu, MD   650 mg at 07/17/21 1816   amLODipine (NORVASC) tablet 2.5 mg  2.5 mg Oral Daily Bonnell Public Tublu, MD   2.5 mg at 07/20/21 1112   atorvastatin (LIPITOR) tablet 20 mg  20 mg Oral Daily Bonnell Public Tublu, MD   20 mg at 07/20/21 1111   bisacodyl (DULCOLAX) suppository 10 mg  10 mg Rectal Daily PRN Vashti Hey, MD       dextrose 50 % solution  50 mL  1 ampule Intravenous Q6H PRN Eliseo Squires, Jessica U, DO       divalproex (DEPAKOTE) DR tablet 500 mg  500 mg Oral Q8H Donnetta Simpers, MD       enoxaparin (LOVENOX) injection 40 mg  40 mg Subcutaneous Q24H Bonnell Public Tublu, MD   40 mg at 07/19/21 1500   FLUoxetine (PROZAC) capsule 40 mg  40 mg Oral Daily Bonnell Public Tublu, MD   40 mg at 07/20/21 1112   levETIRAcetam (KEPPRA) IVPB 1000 mg/100 mL premix  1,000 mg Intravenous Once Rikki Spearing, NP       levETIRAcetam (KEPPRA) tablet 500 mg  500 mg Oral BID Rikki Spearing, NP       metoprolol tartrate (LOPRESSOR) injection 2.5 mg  2.5 mg  Intravenous Q6H PRN Irene Pap N, DO   2.5 mg at 07/19/21 1630   mirtazapine (REMERON) tablet 30 mg  30 mg Oral QHS Bonnell Public Tublu, MD   30 mg at 07/19/21 2052   sodium phosphate (FLEET) 7-19 GM/118ML enema 1 enema  1 enema Rectal Once PRN Bonnell Public Tublu, MD       thiamine (B-1) injection 100 mg  100 mg Intravenous Daily Donnetta Simpers, MD   100 mg at 07/19/21 [id number]   Or   thiamine tablet 100 mg  100 mg Oral Daily Donnetta Simpers, MD   100 mg at 07/20/21 1112     Discharge Medications: Please see discharge summary for a list of discharge medications.  Relevant Imaging Results:  Relevant Lab Results:   Additional Information SS#: [id number]  Geralynn Ochs, LCSW

## 2021-07-20 NOTE — Care Management Important Message (Signed)
Important Message  Patient Details  Name: Brad [name] MRN: [id number] Date of Birth: 1946-04-03   Medicare Important Message Given:  Yes     Orbie Pyo 07/20/2021, 2:01 PM

## 2021-07-20 NOTE — Progress Notes (Signed)
Neurology Progress Note  Subjective: No acute overnight events He remains on cEEG with evidence of seizure at 07:40 this morning.  He is eating breakfast on evaluation this morning, speech remains unintelligible  Exam: Vitals:   07/20/21 0300 07/20/21 0807  BP: (!) 166/84 129/87  Pulse: 69 86  Resp: 20 18  Temp: 97.9 F (36.6 C) 97.8 F (36.6 C)  SpO2: 100% 100%   Gen: Frail appearing male sitting up in bed eating breakfast with assistance. He is in no acute distress Resp: non-labored breathing, no respiratory distress on room air Abd: soft, non-distended  Neuro: Mental Status: Awake and alert. Speech is unintelligible.  He does not follow commands for examiner but seems to attempt to give a "high-five" within the limitation of his flexed contractures throughout. He does open his mouth and loudly vocalizes unintelligibly to signify he wanted more food to staff assisting him with feeding at bedside. No evidence of neglect.  Cranial Nerves: PERRL, EOMI will fixate on examiner in lateral eye fields but does not track examiner movements throughout, face appears symmetric with movement, head appears grossly midline, symmetrical palate elevation.  Motor: Flexed contractures with rigidity throughout each extremity without spontaneous movement noted. Bulk is significantly decreased throughout.  Gait: Deferred  Pertinent Labs: CBC    Component Value Date/Time   WBC 8.4 07/18/2021 0237   RBC 2.96 (L) 07/18/2021 0237   HGB 8.9 (L) 07/18/2021 0237   HGB 10.1 (L) 04/19/2018 1629   HCT 28.4 (L) 07/18/2021 0237   HCT 31.6 (L) 04/19/2018 1629   PLT 261 07/18/2021 0237   PLT 168 04/19/2018 1629   MCV 95.9 07/18/2021 0237   MCV 93.1 05/22/2018 1105   MCV 95 04/19/2018 1629   MCH 30.1 07/18/2021 0237   MCHC 31.3 07/18/2021 0237   RDW 12.4 07/18/2021 0237   RDW 13.3 04/19/2018 1629   LYMPHSABS 1.6 07/17/2021 0659   MONOABS 0.4 07/17/2021 0659   EOSABS 0.1 07/17/2021 0659   BASOSABS  0.0 07/17/2021 0659   CMP     Component Value Date/Time   NA 136 07/20/2021 0223   NA 142 04/19/2018 1629   K 3.5 07/20/2021 0223   CL 101 07/20/2021 0223   CO2 24 07/20/2021 0223   GLUCOSE 113 (H) 07/20/2021 0223   BUN 8 07/20/2021 0223   BUN 21 04/19/2018 1629   CREATININE 0.50 (L) 07/20/2021 0223   CALCIUM 8.8 (L) 07/20/2021 0223   PROT 6.7 07/17/2021 0659   PROT 6.9 04/19/2018 1629   ALBUMIN 2.6 (L) 07/17/2021 0659   ALBUMIN 4.2 04/19/2018 1629   AST 20 07/17/2021 0659   ALT 14 07/17/2021 0659   ALKPHOS 62 07/17/2021 0659   BILITOT 0.8 07/17/2021 0659   BILITOT 0.5 04/19/2018 1629   GFRNONAA >60 07/20/2021 0223   GFRAA >60 09/17/2020 1346   Lab Results  Component Value Date   VALPROATE 48 (L) 07/19/2021   Imaging Reviewed:  CT Head without contrast 07/17/2021: 1. Cerebral atrophy with moderate similar ventriculomegaly. Ventriculomegaly could be secondary to atrophy or represent normal pressure hydrocephalus. Periventricular white matter hypoattenuation may be slightly progressive and could represent small vessel ischemic change or transependymal CSF resorption. 2. Otherwise, no acute intracranial abnormality.  EEG 07/17/2021: "This technically difficult study is suggestive of moderate diffuse encephalopathy, nonspecific etiology. No seizures or epileptiform discharges were seen throughout the recording."   cEEG 07/19/2021: "This study showed seizures without clinical signs arising from left frontal region, on 07/18/2021 at 2128, 2216 and on  07/19/2021 at 0657.  There is also evidence of cortical dysfunction in left hemisphere likely secondary to underlying structural abnormality, postictal state.  Additionally there is moderate diffuse encephalopathy, nonspecific etiology but likely secondary to seizures."  cEEG 07/20/2021 formal read pending  Assessment: 79M with hx of seizures on VPA, substance abuse, HTN, DM2, prostate cancer and depression, poor functional baseline and bed  bound but conversant and oriented to self and place at baseline who presented initially with prolonged seizure at his SNF. Was seizing for 20 mins before EMS activated. Initially had R gaze deviation with facial twitching s/p '2mg'$  Ativan and '3500mg'$  Keppra with resolution in the ED with subtherapeutic VPA levels.  - He was started on cEEG due to persistent somnolence with evidence of seizures without clinical signs arising from left frontal region, on 07/18/2021 at 2128, 2216 and on 07/19/2021 at 0657.  - VPA levels remained low despite uptitration of dosing so room to go up until seizure control is achieved. 07/20/2021 VPA level at goal but with borderline high ammonia at 36. I would favor a second antiepileptic agent at this time due to ammonia increase with increasing VPA dosing. Keppra initiated for seizure control; 1,000 mg once followed by 500 mg BID maintenance dosing. - He is more interactive today and vocalizing but speech is difficult to understand. He does fixate on examiner in lateral eye fields but does not track examiner. He does not follow commands. His infectious workup has been negative.  Impression:  - Seizure disorder - VPA level subtherapeutic on arrival- therapeutic level achieved 07/20/2021 - Dementia  Recommendations: - Continue cEEG for 24 hours - VPA level therapeutic at 60, ammonia borderline high at 36 - Due to borderline high ammonia with recent VPA increases, favor adding second agent for seizure control - Keppra 1,000 mg load x 1 IV  - Maintenance Keppra 500 mg BID  - Maintain inpatient seizure precautions - Ativan '2mg'$  IV PRN seizure > 5 minutes and notify neurology   Anibal [place], AGACNP-BC [facility] [phone]  NEUROHOSPITALIST ADDENDUM Performed a face to face diagnostic evaluation.   I have reviewed the contents of history and physical exam as documented by PA/ARNP/Resident and agree with above documentation.  I have discussed and formulated the  above plan as documented. Edits to the note have been made as needed.  Had a seizure this AM, lasted about 7 mins on cEEG. He is on higher dose of VPA with levels now just getting therapeutic. There is room to go up on VPA but now his Ammonia is borderline high. Would rather add another AED at this time. Started on Keppra '500mg'$  BID. Will keep cEEG on for one more day. Want him to be seizure free for 24 hours before we send him back to his facility. He is mentating well now, will track my face on the left. Speech is unintelligible but can communicate his needs. He was being fed oat meal and water by team in AM when we saw him.  Donnetta Simpers, MD [facility] [id number]   If 7pm to 7am, please call on call as listed on AMION.

## 2021-07-20 NOTE — Procedures (Addendum)
Patient Name: Brad [name]  MRN: [id number]  Epilepsy Attending: Lora Havens  Referring Physician/Provider: Clance Boll, NP Duration: 8/2/20221158 to 07/20/2021 1158   Patient history:  75yo M with h/o epilepsy presented with seizure described as gaze to right and facial twitching upon EMS arrival. EEG to evaluate for seizure   Level of alertness:  awake asleep   AEDs during EEG study: VPA   Technical aspects: This EEG study was done with scalp electrodes positioned according to the 10-20 International system of electrode placement. Electrical activity was acquired at a sampling rate of '500Hz'$  and reviewed with a high frequency filter of '70Hz'$  and a low frequency filter of '1Hz'$ . EEG data were recorded continuously and digitally stored.   Description: During awake state, no clear posterior dominant rhythm was seen. Sleep was characterized by sleep spindles (12 to '14Hz'$ ) EEG showed continuous generalized and lateralized left hemisphere 3 to 6 Hz theta-delta slowing. Frequent spikes and polyspikes were noted in left frontal region.   On 07/20/2021 at 0740, patient was noted to have right facial twitching followed by non versive head deviation to right. Concomitant eeg showed spikes in left frontal region which gradually spread to right frontal region and eventually involved all of left and right hemisphere. This was consistent with focal seizure. Seizure lasted for about 7 minutes.  On 07/20/2021 at 1134, patient was noted to have right facial twitching followed by non versive head deviation to right. Concomitant eeg showed spikes in left frontal region which gradually spread to right frontal region and eventually involved all of left and right hemisphere. This was consistent with focal seizure. Seizure lasted for about 1.5 minutes.  ABNORMALITY - Focal seizure, left frontal region - Spikes, left frontal region - Continuous slow, generalized and lateralized left hemisphere    IMPRESSION: This study showed one focal motor seizure on 07/20/2021 at 0740 and 1134 during which patient had right face twitching arising from left frontal region, lasting about 7 and 1.5 minutes consecutively. There is also cortical dysfunction in left hemisphere likely secondary to underlying structural abnormality, postictal state.  Additionally there is moderate diffuse encephalopathy, nonspecific etiology.    Kaeden Mester Barbra Sarks

## 2021-07-20 NOTE — Progress Notes (Addendum)
CM called and left voicemail for pts sister X 2. TOC following.

## 2021-07-20 NOTE — Progress Notes (Signed)
Maint complete. 

## 2021-07-20 NOTE — Progress Notes (Signed)
  Speech Language Pathology Treatment: Dysphagia  Patient Details Name: Brad [name] MRN: [id number] DOB: Apr 19, 1946 Today's Date: 07/20/2021 Time: [id number] SLP Time Calculation (min) (ACUTE ONLY): 8 min  Assessment / Plan / Recommendation Clinical Impression  Pt's swallowing remains relatively consistent with trials from previous date, in which his awareness impacts bolus acceptance. He has more oral residue with purees today, although residue is mild in quantity. He also had some coughing with purees. With thin liquids he has eructation and some additional subswallows but does not show overt s/s of aspiration. Recommend continuing with full liquid diet for now. Note that family is also having ongoing conversations about overall GOC.    HPI HPI: Patient is a 75 y.o. male with PMH: dementia, HTN, DM2, prostate CA, seizure disorder, substance abuse who lives in a nursing home and is bedbound with multiple contractures who was sent in from a [facility] for seizures.  EMS was called and he was noted to have right gaze preference with facial twitching.  He was treated with Versed with some improvement and then seizures recurred again. He has h/o dysphagia and has been seen by ST services during previous admissions. MBS in 2020 revealed mild pharyngeal phase dysphagia but esophagram that same day revealed moderate esophageal dysmotility with no evidence of esophageal stricture or hiatal hernia.      SLP Plan  Continue with current plan of care       Recommendations  Diet recommendations: Thin liquid (full liquid diet) Liquids provided via: Cup;Straw Medication Administration: Crushed with puree Supervision: Full supervision/cueing for compensatory strategies;Staff to assist with self feeding Compensations: Minimize environmental distractions;Slow rate;Small sips/bites Postural Changes and/or Swallow Maneuvers: Seated upright 90 degrees;Upright 30-60 min after meal                 Oral Care Recommendations: Oral care BID Follow up Recommendations: Skilled Nursing facility SLP Visit Diagnosis: Dysphagia, unspecified (R13.10) Plan: Continue with current plan of care       GO                Osie Bond., M.A. [facility] Pager [phone] Office [phone]  07/20/2021, 3:29 PM

## 2021-07-20 NOTE — Progress Notes (Signed)
Progress Note   [name]  [id number] DOB: November 30, 1946  DOA: 07/17/2021 PCP: [name], DO   Brief Narrative:    Brad [name] is an 75 y.o. male with history of dementia, HTN, DM2, prostate CA, seizure disorder, history of substance abuse from skilled nursing facility, bedbound with multiple contractures was sent to the hospital with concerns for seizures.    Assessment/Plan:   Principal Problem:   Seizures ([place]) Active Problems:   Hypertension   Diabetes ([place])   Dementia ([facility])   Bedbound  Altered mental status likely metabolic encephalopathy likely secondary to seizures and postictal state. Patient does have baseline dementia but at baseline, able to communicate.   Seizure disorder  Neurology is following.  Valproic acid was subtherapeutic on presentation.  Therapeutic levels achieved on 07/20/2021.  Patient has been treated with Ativan Keppra and valproate.  EEG showed seizures with underlying encephalopathy.  Seizures this morning.  On continuous EEG for next 24 hours.  On maintenance Keppra 500 twice daily.  Will be given Keppra 1 g load today.  Neurology recommends seizure-free for at least 24 hours prior to discharge.     Essential HTN Continue amlodipine.  Closely monitor blood pressure.   Anxiety and depression Continue mirtazapine and fluoxetine   Chronic pain Tramadol has been discontinued  Hypokalemia Will replenish as necessary.  At this time potassium was 3.5.  High TSH But normal T4.  Check later on in few weeks.  Hypoglycemia Will need to monitor.  Was eating some this morning.  Aspiration precautions.  Seizure precautions.  As needed glucose if needed.  Goals for care Palliative care has been consulted.  We will continue to discuss goals of care.  DVT prophylaxis: Lovenox   Code Status: DNR  Disposition Plan:  Status is: Inpatient  Remains inpatient appropriate because:Inpatient level of care appropriate due to severity of  illness  Dispo: The patient is from: SNF              Anticipated d/c is to: SNF/palliative care              Patient currently is not medically stable to d/c.   Difficult to place patient No   Medical Consultants:   Neurology  Subjective:   Today patient was seen at bedside.  Was getting continuous EEG.  Patient is stating Yeah but not making much sense.  Objective:    Vitals:   07/19/21 2300 07/20/21 0300 07/20/21 0807 07/20/21 1106  BP: (!) 164/58 (!) 166/84 129/87 (!) 126/97  Pulse: 70 69 86 87  Resp: '20 20 18 16  '$ Temp: 98.3 F (36.8 C) 97.9 F (36.6 C) 97.8 F (36.6 C) (!) 97.5 F (36.4 C)  TempSrc: Oral Oral Oral   SpO2: 100% 100% 100% 100%  Weight:        Intake/Output Summary (Last 24 hours) at 07/20/2021 1334 Last data filed at 07/20/2021 0815 Gross per 24 hour  Intake 402.5 ml  Output 1300 ml  Net -897.5 ml    Filed Weights   07/19/21 0900  Weight: 57 kg    Physical exam General: Thinly built, contracted, mildly verbal, frail male. HENT:   No scleral pallor or icterus noted. Oral mucosa with whitish patches Chest:  Clear breath sounds.  Diminished breath sounds bilaterally. No crackles or wheezes.  CVS: S1 &S2 heard. No murmur.  Regular rate and rhythm. Abdomen: Soft, nontender, nondistended.  Bowel sounds are heard.   Extremities: No cyanosis, clubbing or  edema.  Peripheral pulses are palpable. Psych: Difficult to understand speech. CNS: Contracted extremities.  Difficult to understand speech.  Alert awake. Skin: Warm and dry.  No rashes noted.   Data Reviewed:   I have reviewed the following labs and imaging studies.  Labs: Basic Metabolic Panel: Recent Labs  Lab 07/17/21 0659 07/17/21 1418 07/18/21 0237 07/20/21 0223  NA 134*  --  135 136  K 4.2  --  3.4* 3.5  CL 99  --  100 101  CO2 26  --  27 24  GLUCOSE 75  --  67* 113*  BUN 13  --  15 8  CREATININE 0.59* 0.66 0.64 0.50*  CALCIUM 8.9  --  8.7* 8.8*    GFR Estimated  Creatinine Clearance: 66.3 mL/min (A) (by C-G formula based on SCr of 0.5 mg/dL (L)). Liver Function Tests: Recent Labs  Lab 07/17/21 0659  AST 20  ALT 14  ALKPHOS 62  BILITOT 0.8  PROT 6.7  ALBUMIN 2.6*    No results for input(s): LIPASE, AMYLASE in the last 168 hours. Recent Labs  Lab 07/18/21 0954 07/20/21 0813  AMMONIA 16 36*    Coagulation profile No results for input(s): INR, PROTIME in the last 168 hours.  CBC: Recent Labs  Lab 07/17/21 0659 07/17/21 1418 07/18/21 0237  WBC 8.3 12.5* 8.4  NEUTROABS 6.2  --   --   HGB 10.8* 10.1* 8.9*  HCT 34.6* 31.6* 28.4*  MCV 96.9 95.2 95.9  PLT 289 283 261    Cardiac Enzymes: Recent Labs  Lab 07/18/21 0237  CKTOTAL 101    BNP (last 3 results) No results for input(s): PROBNP in the last 8760 hours. CBG: Recent Labs  Lab 07/19/21 1154 07/19/21 1657 07/19/21 2343 07/20/21 0642 07/20/21 1114  GLUCAP 139* 99 91 89 181*    D-Dimer: No results for input(s): DDIMER in the last 72 hours. Hgb A1c: No results for input(s): HGBA1C in the last 72 hours. Lipid Profile: No results for input(s): CHOL, HDL, LDLCALC, TRIG, CHOLHDL, LDLDIRECT in the last 72 hours. Thyroid function studies: Recent Labs    07/18/21 0954  TSH 4.976*    Anemia work up: Recent Labs    07/18/21 0954  VITAMINB12 1,081*    Sepsis Labs: Recent Labs  Lab 07/17/21 0659 07/17/21 1418 07/18/21 0237  WBC 8.3 12.5* 8.4     Microbiology Recent Results (from the past 240 hour(s))  Resp Panel by RT-PCR (Flu A&B, Covid) Nasopharyngeal Swab     Status: None   Collection Time: 07/17/21  6:59 AM   Specimen: Nasopharyngeal Swab; Nasopharyngeal(NP) swabs in vial transport medium  Result Value Ref Range Status   SARS Coronavirus 2 by RT PCR NEGATIVE NEGATIVE Final    Comment: (NOTE) SARS-CoV-2 target nucleic acids are NOT DETECTED.  The SARS-CoV-2 RNA is generally detectable in upper respiratory specimens during the acute phase of  infection. The lowest concentration of SARS-CoV-2 viral copies this assay can detect is 138 copies/mL. A negative result does not preclude SARS-Cov-2 infection and should not be used as the sole basis for treatment or other patient management decisions. A negative result may occur with  improper specimen collection/handling, submission of specimen other than nasopharyngeal swab, presence of viral mutation(s) within the areas targeted by this assay, and inadequate number of viral copies(<138 copies/mL). A negative result must be combined with clinical observations, patient history, and epidemiological information. The expected result is Negative.  Fact Sheet for Patients:  EntrepreneurPulse.com.au  Fact  Sheet for Healthcare Providers:  IncredibleEmployment.be  This test is no t yet approved or cleared by the [place] FDA and  has been authorized for detection and/or diagnosis of SARS-CoV-2 by FDA under an Emergency Use Authorization (EUA). This EUA will remain  in effect (meaning this test can be used) for the duration of the COVID-19 declaration under Section 564(b)(1) of the Act, 21 U.S.C.section 360bbb-3(b)(1), unless the authorization is terminated  or revoked sooner.       Influenza A by PCR NEGATIVE NEGATIVE Final   Influenza B by PCR NEGATIVE NEGATIVE Final    Comment: (NOTE) The Xpert Xpress SARS-CoV-2/FLU/RSV plus assay is intended as an aid in the diagnosis of influenza from Nasopharyngeal swab specimens and should not be used as a sole basis for treatment. Nasal washings and aspirates are unacceptable for Xpert Xpress SARS-CoV-2/FLU/RSV testing.  Fact Sheet for Patients: EntrepreneurPulse.com.au  Fact Sheet for Healthcare Providers: IncredibleEmployment.be  This test is not yet approved or cleared by the [place] FDA and has been authorized for detection and/or diagnosis of SARS-CoV-2  by FDA under an Emergency Use Authorization (EUA). This EUA will remain in effect (meaning this test can be used) for the duration of the COVID-19 declaration under Section 564(b)(1) of the Act, 21 U.S.C. section 360bbb-3(b)(1), unless the authorization is terminated or revoked.  Performed at [facility], [place] [address] [place], [place] [address]     Procedures and diagnostic studies:  Overnight EEG with video  Result Date: 07/19/2021 Lora Havens, MD     07/20/2021  8:23 AM Patient Name: Brad [name] MRN: [id number] Epilepsy Attending: Lora Havens Referring Physician/Provider: Clance Boll, NP Duration: 8/1/20221158 to 07/19/2021 1158  Patient history:  75yo M with h/o epilepsy presented with seizure described as gaze to right and facial twitching upon EMS arrival. EEG to evaluate for seizure  Level of alertness:  awake asleep  AEDs during EEG study: VPA  Technical aspects: This EEG study was done with scalp electrodes positioned according to the 10-20 International system of electrode placement. Electrical activity was acquired at a sampling rate of '500Hz'$  and reviewed with a high frequency filter of '70Hz'$  and a low frequency filter of '1Hz'$ . EEG data were recorded continuously and digitally stored.  Description: During awake state, no clear posterior dominant rhythm was seen. Sleep was characterized by sleep spindles (12 to '14Hz'$ ) EEG showed continuous generalized and lateralized left hemisphere 3 to 6 Hz theta-delta slowing.  Frequent spikes and polyspikes were noted in left frontal region.  Seizures without clinical signs were noted originating from left frontal region on 07/18/2021 at 2128, 2216 and on 07/19/2021 at 0657.  ABNORMALITY - Seizure without clinical sign, left frontal region - Continuous slow, generalized and lateralized left hemisphere  IMPRESSION: This study showed seizures without clinical signs arising from left frontal region, on 07/18/2021 at 2128, 2216 and on  07/19/2021 at 0657.  There is also evidence of cortical dysfunction in left hemisphere likely secondary to underlying structural abnormality, postictal state.  Additionally there is moderate diffuse encephalopathy, nonspecific etiology but likely secondary to seizures.  Priyanka Barbra Sarks    Medications:    acetaminophen  500 mg Oral TID   amLODipine  2.5 mg Oral Daily   atorvastatin  20 mg Oral Daily   divalproex  500 mg Oral Q8H   enoxaparin (LOVENOX) injection  40 mg Subcutaneous Q24H   FLUoxetine  40 mg Oral Daily   levETIRAcetam  500 mg Oral  BID   mirtazapine  30 mg Oral QHS   nystatin  5 mL Oral QID   thiamine injection  100 mg Intravenous Daily   Or   thiamine  100 mg Oral Daily   Continuous Infusions:  levETIRAcetam 2,000 mg (07/20/21 1321)     LOS: 3 days   Andrell Bergeson  [facility] 07/20/2021, 1:34 PM

## 2021-07-20 NOTE — Consult Note (Signed)
Palliative Medicine Inpatient Consult Note  Consulting Provider: Geradine Girt, DO  Reason for consult:   [facility] Palliative Medicine Consult  Reason for Consult? goc   HPI:  Per intake H&P --> Brad [name] is an 75 y.o. male with history of dementia, HTN, DM2, prostate CA, seizure disorder, substance abuse who lives in a nursing home and is bedbound with multiple contractures who was sent in from SNF for seizures.  Clinical Assessment/Goals of Care:  *Please note that this is a verbal dictation therefore any spelling or grammatical errors are due to the "[employer]" system interpretation.  I have reviewed medical records including EPIC notes, labs and imaging, received report from bedside RN, assessed the patient who is lying in bed with EEG monitor in place, he is speaking though nonsenically.    I called Brad [name], sister to further discuss diagnosis prognosis, GOC, EOL wishes, disposition and options.  Brad [name] and I reviewed Garner's past medical history inclusive of his significant history of dementia which is progressed to the point of him having total body contractures, loss of continence of both urine and bowel, loss of ability to hold his head up independently.  Also reviewed his history of hypertension diabetes and seizures.   I introduced Palliative Medicine as specialized medical care for people living with serious illness. It focuses on providing relief from the symptoms and stress of a serious illness. The goal is to improve quality of life for both the patient and the family.  Brad [name] is originally from [place], [place]. He relocated to [place] in 1969 to "get away from the country" and "have a better way of life". Over the years, he worked as a Horticulturist, commercial, a Retail buyer, and at a Medical sales representative. He never married or had children. He moved back to [place] in 2004.   Patient has lived with his sister Brad [name] from 2018 to 12/21. When  he first came to live with her, he was mostly independent. Brad [name] reports after his hospitalization in December of this year he has lived at at [facility] skilled nursing facility. He has declined significantly since placement there. He has gone from being somewhat mobile to completely dependent with full body contractures. He has fallen out of bed four times and staff has at times been unable to get his medications in him.   A detailed discussion was had today regarding advanced directives- Brad [name] is his Media planner.    Concepts specific to code status, artifical feeding and hydration, continued IV antibiotics and rehospitalization was had.  We verified patients is DNAR/DNI.  The difference between a aggressive medical intervention path  and a palliative comfort care path for this patient at this time was had. [place] attests that Brad [name] has been unhappy for some time now and is having constant unremitting pain in the setting of his contractures.  We reviewed that he is truly yet end-stage dementia and it might be reasonable to sitter allowing him to have comfort and quality at the end of his life.  We reviewed what comfort measures are and would look like in the hospital.  We reviewed what inpatient hospice is and how this may be better able to facilitate [facility] end-of-life journey than any other medical setting.  That expresses that she is feeling "tired" today and is not sure what to do but she would like to think over these ideas and reconvene on Friday at 10:30 AM.  In the meanwhile she is willing to  have conversations with the hospice liaison's to better understand what inpatient hospice looks like.  Discussed the importance of continued conversation with family and their  medical providers regarding overall plan of care and treatment options, ensuring decisions are within the context of the patients values and GOCs.  Decision Maker: Brad [name] (sister) [phone]  SUMMARY  OF RECOMMENDATIONS   DNAR/DNI  I was able to approach the difficult topics of comfort based in hospice care.  Patient's sister shares that she is feeling tired today though she would like to think about these concepts more fully over the next day and meet with the palliative care team again Friday morning.  Patient's sister would like to discuss with case management and the hospice team what inpatient hospice care looks like as she is very clear on not wanting Brad [name] to go back to [facility] and acknowledging that he is at the end stage of his disease and "suffering"  Patient appears to have oral thrush and will be initiated on nystatin  Will add around-the-clock Tylenol for pain from contractures  Need of care will continue to offer ongoing support  Code Status/Advance Care Planning: DNAR/DNI   Palliative Prophylaxis:  Oral Care, Turn Q2H  Additional Recommendations (Limitations, Scope, Preferences): Continue current scope of care   Psycho-social/Spiritual:  Desire for further Chaplaincy support: No Additional Recommendations: Education on progressive nature of dementia   Prognosis: End-stage dementia fast score 19F.  Hospice is appropriate presently  Discharge Planning: Discharge plan unclear  Vitals:   07/20/21 0807 07/20/21 1106  BP: 129/87 (!) 126/97  Pulse: 86 87  Resp: 18 16  Temp: 97.8 F (36.6 C) (!) 97.5 F (36.4 C)  SpO2: 100% 100%    Intake/Output Summary (Last 24 hours) at 07/20/2021 1222 Last data filed at 07/20/2021 0815 Gross per 24 hour  Intake 760.5 ml  Output 1300 ml  Net -539.5 ml   Last Weight  Most recent update: 07/19/2021  9:10 AM    Weight  57 kg (125 lb 10.6 oz)            Gen:  Elderly M in mild distress HEENT: (+) Oral thrush CV: Regular rate and rhythm  PULM: On RA ABD: soft/nontender  EXT: (+) pedal edema Neuro: Disoriented, parroting words  PPS: 10%   This conversation/these recommendations were discussed with patient primary  care team, Dr. Louanne Belton  Time In:  1310 Time Out: 1420 Total Time: 70 Greater than 50%  of this time was spent counseling and coordinating care related to the above assessment and plan.  [place] Team Team Cell Phone: [phone] Please utilize secure chat with additional questions, if there is no response within 30 minutes please call the above phone number  Palliative Medicine Team providers are available by phone from 7am to 7pm daily and can be reached through the team cell phone.  Should this patient require assistance outside of these hours, please call the patient's attending physician.

## 2021-07-21 DIAGNOSIS — Z7401 Bed confinement status: Secondary | ICD-10-CM | POA: Diagnosis not present

## 2021-07-21 DIAGNOSIS — R569 Unspecified convulsions: Secondary | ICD-10-CM | POA: Diagnosis not present

## 2021-07-21 DIAGNOSIS — E119 Type 2 diabetes mellitus without complications: Secondary | ICD-10-CM | POA: Diagnosis not present

## 2021-07-21 DIAGNOSIS — I10 Essential (primary) hypertension: Secondary | ICD-10-CM | POA: Diagnosis not present

## 2021-07-21 LAB — GLUCOSE, CAPILLARY
Glucose-Capillary: 105 mg/dL — ABNORMAL HIGH (ref 70–99)
Glucose-Capillary: 87 mg/dL (ref 70–99)
Glucose-Capillary: 89 mg/dL (ref 70–99)

## 2021-07-21 LAB — BASIC METABOLIC PANEL
Anion gap: 13 (ref 5–15)
BUN: 6 mg/dL — ABNORMAL LOW (ref 8–23)
CO2: 24 mmol/L (ref 22–32)
Calcium: 8.9 mg/dL (ref 8.9–10.3)
Chloride: 96 mmol/L — ABNORMAL LOW (ref 98–111)
Creatinine, Ser: 0.45 mg/dL — ABNORMAL LOW (ref 0.61–1.24)
GFR, Estimated: >60 mL/min (ref >60)
Glucose, Bld: 91 mg/dL (ref 70–99)
Potassium: 3.3 mmol/L — ABNORMAL LOW (ref 3.5–5.1)
Sodium: 133 mmol/L — ABNORMAL LOW (ref 135–145)

## 2021-07-21 LAB — CBC
HCT: 36 % — ABNORMAL LOW (ref 39.0–52.0)
Hemoglobin: 11.7 g/dL — ABNORMAL LOW (ref 13.0–17.0)
MCH: 29.8 pg (ref 26.0–34.0)
MCHC: 32.5 g/dL (ref 30.0–36.0)
MCV: 91.8 fL (ref 80.0–100.0)
Platelets: 294 10*3/uL (ref 150–400)
RBC: 3.92 MIL/uL — ABNORMAL LOW (ref 4.22–5.81)
RDW: 12.1 % (ref 11.5–15.5)
WBC: 12.1 10*3/uL — ABNORMAL HIGH (ref 4.0–10.5)
nRBC: 0 % (ref 0.0–0.2)

## 2021-07-21 LAB — MAGNESIUM: Magnesium: 1.1 mg/dL — ABNORMAL LOW (ref 1.7–2.4)

## 2021-07-21 MED ORDER — MAGNESIUM SULFATE 2 GM/50ML IV SOLN
2.0000 g | Freq: Once | INTRAVENOUS | Status: DC
  Filled 2021-07-21: qty 50

## 2021-07-21 MED ORDER — POTASSIUM CHLORIDE 10 MEQ/100ML IV SOLN
10.0000 meq | INTRAVENOUS | Status: AC
  Administered 2021-07-21 (×4): 10 meq via INTRAVENOUS
  Filled 2021-07-21: qty 100

## 2021-07-21 NOTE — Procedures (Signed)
Patient Name: Brad [name]  MRN: [id number]  Epilepsy Attending: Lora Havens  Referring Physician/Provider: Clance Boll, NP Duration: 8/3/20221158 to 07/21/2021 1158   Patient history:  75yo M with h/o epilepsy presented with seizure described as gaze to right and facial twitching upon EMS arrival. EEG to evaluate for seizure   Level of alertness:  awake asleep   AEDs during EEG study: VPA, LEV   Technical aspects: This EEG study was done with scalp electrodes positioned according to the 10-20 International system of electrode placement. Electrical activity was acquired at a sampling rate of '500Hz'$  and reviewed with a high frequency filter of '70Hz'$  and a low frequency filter of '1Hz'$ . EEG data were recorded continuously and digitally stored.   Description: During awake state, no clear posterior dominant rhythm was seen. Sleep was characterized by sleep spindles (12 to '14Hz'$ ) EEG showed continuous generalized and lateralized left hemisphere 3 to 6 Hz theta-delta slowing. Frequent spikes and polyspikes were noted in left frontal region.     ABNORMALITY - Spikes, left frontal region - Continuous slow, generalized and lateralized left hemisphere   IMPRESSION: This study showed evidence of epileptogenicity arising from left frontal region. There is also cortical dysfunction in left hemisphere likely secondary to underlying structural abnormality, postictal state.  Additionally there is moderate diffuse encephalopathy, nonspecific etiology.  No seizures were seen during the study   [place]

## 2021-07-21 NOTE — TOC Initial Note (Signed)
Transition of Care [facility]) - Initial/Assessment Note    Patient Details  Name: Brad [name] MRN: [id number] Date of Birth: Jan 24, 1946  Transition of Care [facility]) CM/SW Contact:    Pollie Friar, RN Phone Number: 07/21/2021, 10:10 AM  Clinical Narrative:                 CM consulted by palliative for potential hospice services at d/c. CM spoke with patients sister on the phone last night and she wants to talk with hospice services. She lives in [place] so is interested in [place]. CM has reached out to Bevely Palmer with [employer] and they are going to call his sister to arrange a meeting.  TOC following.  Expected Discharge Plan: Skilled Nursing Facility Barriers to Discharge: Continued Medical Work up   Patient Goals and CMS Choice   CMS Medicare.gov Compare Post Acute Care list provided to:: Patient Represenative (must comment) Choice offered to / list presented to : Sibling  Expected Discharge Plan and Services Expected Discharge Plan: [place] In-house Referral: Clinical Social Work Discharge Planning Services: CM Consult Post Acute Care Choice: [place] arrangements for the past 2 months: [place]                                      Prior Living Arrangements/Services Living arrangements for the past 2 months: [place] Lives with:: Facility Resident Patient language and need for interpreter reviewed:: Yes Do you feel safe going back to the place where you live?: Yes        Care giver support system in place?: No (comment)   Criminal Activity/Legal Involvement Pertinent to Current Situation/Hospitalization: No - Comment as needed  Activities of Daily Living      Permission Sought/Granted                  Emotional Assessment           Psych Involvement: No (comment)  Admission diagnosis:  Seizures ([place]) [R56.9] Patient Active Problem List   Diagnosis Date Noted    Seizures ([place]) 07/17/2021   Bedbound 07/17/2021   Dementia ([place]) 03/01/2021   COVID-19 03/01/2021   Femur neck fracture ([place]) 02/28/2021   Protein-calorie malnutrition, severe 12/30/2020   Cachexia ([place]) 12/25/2020   Pressure injury of skin 12/06/2020   Seizure ([place]) 12/05/2020   Diabetes ([place]) 12/05/2020   Breakthrough seizure ([place]) 12/05/2020   Cervical spinal stenosis 12/14/2017   Abnormal CT scan, neck 12/14/2017   History of stroke 12/12/2017   Hyperlipidemia 12/12/2017   Seizure disorder ([place]) 12/12/2017   KNEE PAIN, RIGHT 06/11/2009   NECK PAIN 06/11/2009   Hypertension 02/13/2008   DENTAL CARIES 02/11/2008   PCP:  [name], DO Pharmacy:  No Pharmacies Listed    Social Determinants of Health (SDOH) Interventions    Readmission Risk Interventions No flowsheet data found.

## 2021-07-21 NOTE — Progress Notes (Addendum)
Subjective: No seizures noted overnight.  [id number] to obtain as patient is nonverbal, only repeating his name  Examination  Vital signs in last 24 hours: Temp:  [97.6 F (36.4 C)-98.5 F (36.9 C)] 97.8 F (36.6 C) (08/04 0753) Pulse Rate:  [77-97] 97 (08/04 0753) Resp:  [17-20] 18 (08/04 0753) BP: (94-134)/(62-90) 94/62 (08/04 0753) SpO2:  [95 %-100 %] 100 % (08/04 0753)  General: lying in bed, NAD CVS: pulse-normal rate and rhythm RS: breathing comfortably, symmetric chest expansion bilaterally Extremities: normal, contractures Neuro: Awake, alert, does not follow commands, keeps saying his name, unable to answer other orientation questions, PERRLA, no forced gaze deviation, blinks to threat bilaterally, Flexed contractures with rigidity throughout each extremity without spontaneous movement noted. Bulk is significantly decreased throughout.   Basic Metabolic Panel: Recent Labs  Lab 07/17/21 0659 07/17/21 1418 07/18/21 0237 07/20/21 0223 07/21/21 0518  NA 134*  --  135 136 133*  K 4.2  --  3.4* 3.5 3.3*  CL 99  --  100 101 96*  CO2 26  --  '27 24 24  '$ GLUCOSE 75  --  67* 113* 91  BUN 13  --  15 8 6*  CREATININE 0.59* 0.66 0.64 0.50* 0.45*  CALCIUM 8.9  --  8.7* 8.8* 8.9  MG  --   --   --   --  1.1*    CBC: Recent Labs  Lab 07/17/21 0659 07/17/21 1418 07/18/21 0237 07/21/21 0518  WBC 8.3 12.5* 8.4 12.1*  NEUTROABS 6.2  --   --   --   HGB 10.8* 10.1* 8.9* 11.7*  HCT 34.6* 31.6* 28.4* 36.0*  MCV 96.9 95.2 95.9 91.8  PLT 289 283 261 294     Coagulation Studies: No results for input(s): LABPROT, INR in the last 72 hours.  Imaging CT head without contrast 07/17/2021: Cerebral atrophy with moderate similar ventriculomegaly. Ventriculomegaly could be secondary to atrophy or represent normal pressure hydrocephalus. Periventricular white matter hypoattenuation may be slightly progressive and could represent small vessel ischemic change or transependymal CSF  resorption. Otherwise, no acute intracranial abnormality.  ASSESSMENT AND PLAN: 75 year old male with history of seizures on Depakote, polysubstance abuse, hypertension, diabetes, prostate cancer and depression, poor functional baseline and bedbound but conversant and oriented to self and place at baseline who presented initially with prolonged seizure at [facility].  Epilepsy with breakthrough seizure Acute encephalopathy, postictal Hyperammonemia Hyponatremia Hypokalemia Leukocytosis Microcytic anemia Subclinical hypothyroidism Dementia -Breakthrough seizure in the setting of subtherapeutic Depakote levels  Recommendations -Will likely DC LTM EEG tomorrow if no seizures overnight -Continue Depakote 500 mg every 8 hours, Keppra 500 mg twice daily -Patient is able to say his name today but perseverating and still not oriented to place and time.  Also still not following commands which is not his baseline.  It is possible that this is prolonged postictal encephalopathy in a patient with poor neurological reserve and dementia -Continue seizure precautions -as needed IV Ativan 2 mg for clinical seizure-like activity -Management of rest of comorbidities per primary team  I have spent a total of  25  minutes with the patient reviewing hospital notes,  test results, labs and examining the patient as well as establishing an assessment and plan.  > 50% of time was spent in direct patient care.   Zeb Comfort Epilepsy [facility] For questions after 5pm please refer to AMION to reach the Neurologist on call

## 2021-07-21 NOTE — Progress Notes (Signed)
Progress Note   Brad [name]  [id number] DOB: [birth year]  DOA: 07/17/2021 PCP: [name], DO   Brief Narrative:   Brad [name] is an 75 y.o. male with history of dementia, HTN, DM2, prostate CA, seizure disorder, history of substance abuse from skilled nursing facility, bedbound with multiple contractures was sent to the hospital with concerns for seizures.    Assessment/Plan:   Principal Problem:   Seizures ([place]) Active Problems:   Hypertension   Diabetes ([place])   Dementia (H[facility])   Bedbound  Altered mental status likely metabolic encephalopathy likely secondary to seizures and postictal state. Patient does have baseline dementia but at baseline able to communicate.  Has contractures.   Seizure disorder  Neurology is following.  Valproic acid was subtherapeutic on presentation.  Therapeutic levels achieved on 07/20/2021.   EEG showed seizures with underlying encephalopathy. On continuous EEG as per neurology recommendation.  On maintenance Keppra 500 twice daily.  Received Keppra load yesterday.  Neurology recommends seizure-free for at least 24 hours prior to discharge.    Oral thrush.  We will continue nystatin.   Essential HTN Continue amlodipine.  Closely monitor blood pressure.  Blood pressure seems to be stable   Anxiety and depression Continue mirtazapine and fluoxetine   Chronic pain Tramadol has been discontinued  Hypokalemia Mildly low.  We will continue replacement.  Significant hypomagnesia.  We will replenish.  Will replace again in p.m.  High TSH But normal T4.  Check thyroid profile on in few weeks.  Hypoglycemia Initially.  Improved at this time.  Goals for care Palliative care has been consulted.  We will continue to discuss goals of care.  DVT prophylaxis:  Lovenox subcu  Code Status:  DNR  Disposition Plan:  Status is: Inpatient  Remains inpatient appropriate because:Inpatient level of care appropriate due to severity of  illness  Dispo: The patient is from: SNF              Anticipated d/c is to: SNF/palliative care when bed available.              Patient currently is not medically stable to d/c.   Difficult to place patient No   Medical Consultants:   Neurology  Subjective:   Today, patient was seen and examined at bedside.  Not much verbal.  Objective:    Vitals:   07/20/21 2032 07/20/21 2342 07/21/21 0354 07/21/21 0753  BP: 120/73 134/83 109/75 94/62  Pulse: 89 92 77 97  Resp: '17 18 17 18  '$ Temp: 98.5 F (36.9 C) 97.6 F (36.4 C) 97.9 F (36.6 C) 97.8 F (36.6 C)  TempSrc: Oral Oral Oral Oral  SpO2: 95% 100% 98% 100%  Weight:        Intake/Output Summary (Last 24 hours) at 07/21/2021 1115 Last data filed at 07/21/2021 [id number] Gross per 24 hour  Intake 610 ml  Output 800 ml  Net -190 ml    Filed Weights   07/19/21 0900  Weight: 57 kg   Physical exam General: thinly built, deconditioned and debilitated contracted male mildly verbal frail looking HENT:   No scleral pallor or icterus noted. Oral mucosa with whitish patches Chest:  Clear breath sounds.  Diminished breath sounds bilaterally. No crackles or wheezes.  CVS: S1 &S2 heard. No murmur.  Regular rate and rhythm. Abdomen: Soft, nontender, nondistended.  Bowel sounds are heard.   Extremities: No cyanosis, clubbing or edema.  Peripheral pulses are palpable. Psych: Unintelligible speech.  Alert awake  CNS: Contracted extremities.  Alert awake but unintelligible speech. Skin: Warm and dry.  No rashes noted.   Data Reviewed:   I have reviewed the following labs and imaging studies.  Labs: Basic Metabolic Panel: Recent Labs  Lab 07/17/21 0659 07/17/21 1418 07/18/21 0237 07/20/21 0223 07/21/21 0518  NA 134*  --  135 136 133*  K 4.2  --  3.4* 3.5 3.3*  CL 99  --  100 101 96*  CO2 26  --  '27 24 24  '$ GLUCOSE 75  --  67* 113* 91  BUN 13  --  15 8 6*  CREATININE 0.59* 0.66 0.64 0.50* 0.45*  CALCIUM 8.9  --  8.7* 8.8* 8.9   MG  --   --   --   --  1.1*    GFR Estimated Creatinine Clearance: 66.3 mL/min (A) (by C-G formula based on SCr of 0.45 mg/dL (L)). Liver Function Tests: Recent Labs  Lab 07/17/21 0659  AST 20  ALT 14  ALKPHOS 62  BILITOT 0.8  PROT 6.7  ALBUMIN 2.6*    No results for input(s): LIPASE, AMYLASE in the last 168 hours. Recent Labs  Lab 07/18/21 0954 07/20/21 0813  AMMONIA 16 36*    Coagulation profile No results for input(s): INR, PROTIME in the last 168 hours.  CBC: Recent Labs  Lab 07/17/21 0659 07/17/21 1418 07/18/21 0237 07/21/21 0518  WBC 8.3 12.5* 8.4 12.1*  NEUTROABS 6.2  --   --   --   HGB 10.8* 10.1* 8.9* 11.7*  HCT 34.6* 31.6* 28.4* 36.0*  MCV 96.9 95.2 95.9 91.8  PLT 289 283 261 294    Cardiac Enzymes: Recent Labs  Lab 07/18/21 0237  CKTOTAL 101    BNP (last 3 results) No results for input(s): PROBNP in the last 8760 hours. CBG: Recent Labs  Lab 07/20/21 0642 07/20/21 1114 07/20/21 1755 07/21/21 0034 07/21/21 0617  GLUCAP 89 181* 157* 105* 87    D-Dimer: No results for input(s): DDIMER in the last 72 hours. Hgb A1c: No results for input(s): HGBA1C in the last 72 hours. Lipid Profile: No results for input(s): CHOL, HDL, LDLCALC, TRIG, CHOLHDL, LDLDIRECT in the last 72 hours. Thyroid function studies: No results for input(s): TSH, T4TOTAL, T3FREE, THYROIDAB in the last 72 hours.  Invalid input(s): FREET3  Anemia work up: No results for input(s): VITAMINB12, FOLATE, FERRITIN, TIBC, IRON, RETICCTPCT in the last 72 hours.  Sepsis Labs: Recent Labs  Lab 07/17/21 0659 07/17/21 1418 07/18/21 0237 07/21/21 0518  WBC 8.3 12.5* 8.4 12.1*     Microbiology Recent Results (from the past 240 hour(s))  Resp Panel by RT-PCR (Flu A&B, Covid) Nasopharyngeal Swab     Status: None   Collection Time: 07/17/21  6:59 AM   Specimen: Nasopharyngeal Swab; Nasopharyngeal(NP) swabs in vial transport medium  Result Value Ref Range Status   SARS  Coronavirus 2 by RT PCR NEGATIVE NEGATIVE Final    Comment: (NOTE) SARS-CoV-2 target nucleic acids are NOT DETECTED.  The SARS-CoV-2 RNA is generally detectable in upper respiratory specimens during the acute phase of infection. The lowest concentration of SARS-CoV-2 viral copies this assay can detect is 138 copies/mL. A negative result does not preclude SARS-Cov-2 infection and should not be used as the sole basis for treatment or other patient management decisions. A negative result may occur with  improper specimen collection/handling, submission of specimen other than nasopharyngeal swab, presence of viral mutation(s) within the areas targeted by this assay, and inadequate  number of viral copies(<138 copies/mL). A negative result must be combined with clinical observations, patient history, and epidemiological information. The expected result is Negative.  Fact Sheet for Patients:  EntrepreneurPulse.com.au  Fact Sheet for Healthcare Providers:  IncredibleEmployment.be  This test is no t yet approved or cleared by the [place] FDA and  has been authorized for detection and/or diagnosis of SARS-CoV-2 by FDA under an Emergency Use Authorization (EUA). This EUA will remain  in effect (meaning this test can be used) for the duration of the COVID-19 declaration under Section 564(b)(1) of the Act, 21 U.S.C.section 360bbb-3(b)(1), unless the authorization is terminated  or revoked sooner.       Influenza A by PCR NEGATIVE NEGATIVE Final   Influenza B by PCR NEGATIVE NEGATIVE Final    Comment: (NOTE) The Xpert Xpress SARS-CoV-2/FLU/RSV plus assay is intended as an aid in the diagnosis of influenza from Nasopharyngeal swab specimens and should not be used as a sole basis for treatment. Nasal washings and aspirates are unacceptable for Xpert Xpress SARS-CoV-2/FLU/RSV testing.  Fact Sheet for  Patients: EntrepreneurPulse.com.au  Fact Sheet for Healthcare Providers: IncredibleEmployment.be  This test is not yet approved or cleared by the [place] FDA and has been authorized for detection and/or diagnosis of SARS-CoV-2 by FDA under an Emergency Use Authorization (EUA). This EUA will remain in effect (meaning this test can be used) for the duration of the COVID-19 declaration under Section 564(b)(1) of the Act, 21 U.S.C. section 360bbb-3(b)(1), unless the authorization is terminated or revoked.  Performed at [facility], [place] [address] [place], [place] [address]     Procedures and diagnostic studies:  No results found.  Medications:    acetaminophen  500 mg Oral TID   amLODipine  2.5 mg Oral Daily   atorvastatin  20 mg Oral Daily   divalproex  500 mg Oral Q8H   enoxaparin (LOVENOX) injection  40 mg Subcutaneous Q24H   FLUoxetine  40 mg Oral Daily   levETIRAcetam  500 mg Oral BID   mirtazapine  30 mg Oral QHS   nystatin  5 mL Oral QID   thiamine injection  100 mg Intravenous Daily   Or   thiamine  100 mg Oral Daily   Continuous Infusions:  potassium chloride 10 mEq (07/21/21 0948)     LOS: 4 days   Tian Davison  [facility] 07/21/2021, 11:15 AM

## 2021-07-21 NOTE — Progress Notes (Addendum)
LTM maint complete - no skin breakdown under:  Fp1 F3 F7 A1.  Aforementioned leads were adjusted to provide relief. T8 was reapplied  [facility] monitored, Event button test confirmed by [facility].

## 2021-07-21 NOTE — Plan of Care (Signed)
  Problem: Education: Goal: Expressions of having a comfortable level of knowledge regarding the disease process will increase Outcome: Progressing   Problem: Coping: Goal: Ability to adjust to condition or change in health will improve Outcome: Progressing Goal: Ability to identify appropriate support needs will improve Outcome: Progressing   Problem: Health Behavior/Discharge Planning: Goal: Compliance with prescribed medication regimen will improve Outcome: Progressing   Problem: Medication: Goal: Risk for medication side effects will decrease Outcome: Progressing   Problem: Clinical Measurements: Goal: Complications related to the disease process, condition or treatment will be avoided or minimized Outcome: Progressing Goal: Diagnostic test results will improve Outcome: Progressing   Problem: Safety: Goal: Verbalization of understanding the information provided will improve Outcome: Progressing

## 2021-07-22 DIAGNOSIS — R569 Unspecified convulsions: Secondary | ICD-10-CM | POA: Diagnosis not present

## 2021-07-22 DIAGNOSIS — Z7189 Other specified counseling: Secondary | ICD-10-CM | POA: Diagnosis not present

## 2021-07-22 DIAGNOSIS — I10 Essential (primary) hypertension: Secondary | ICD-10-CM | POA: Diagnosis not present

## 2021-07-22 DIAGNOSIS — Z7401 Bed confinement status: Secondary | ICD-10-CM | POA: Diagnosis not present

## 2021-07-22 DIAGNOSIS — E119 Type 2 diabetes mellitus without complications: Secondary | ICD-10-CM | POA: Diagnosis not present

## 2021-07-22 DIAGNOSIS — Z515 Encounter for palliative care: Secondary | ICD-10-CM | POA: Diagnosis not present

## 2021-07-22 LAB — CBC
HCT: 34.4 % — ABNORMAL LOW (ref 39.0–52.0)
Hemoglobin: 11.3 g/dL — ABNORMAL LOW (ref 13.0–17.0)
MCH: 30.6 pg (ref 26.0–34.0)
MCHC: 32.8 g/dL (ref 30.0–36.0)
MCV: 93.2 fL (ref 80.0–100.0)
Platelets: 264 10*3/uL (ref 150–400)
RBC: 3.69 MIL/uL — ABNORMAL LOW (ref 4.22–5.81)
RDW: 12.3 % (ref 11.5–15.5)
WBC: 11.7 10*3/uL — ABNORMAL HIGH (ref 4.0–10.5)
nRBC: 0 % (ref 0.0–0.2)

## 2021-07-22 LAB — GLUCOSE, CAPILLARY
Glucose-Capillary: 109 mg/dL — ABNORMAL HIGH (ref 70–99)
Glucose-Capillary: 68 mg/dL — ABNORMAL LOW (ref 70–99)
Glucose-Capillary: 78 mg/dL (ref 70–99)
Glucose-Capillary: 89 mg/dL (ref 70–99)
Glucose-Capillary: 64 mg/dL — ABNORMAL LOW (ref 70–99)
Glucose-Capillary: 103 mg/dL — ABNORMAL HIGH (ref 70–99)

## 2021-07-22 LAB — BASIC METABOLIC PANEL
Anion gap: 8 (ref 5–15)
BUN: 7 mg/dL — ABNORMAL LOW (ref 8–23)
CO2: 26 mmol/L (ref 22–32)
Calcium: 8.6 mg/dL — ABNORMAL LOW (ref 8.9–10.3)
Chloride: 103 mmol/L (ref 98–111)
Creatinine, Ser: 0.52 mg/dL — ABNORMAL LOW (ref 0.61–1.24)
GFR, Estimated: >60 mL/min (ref >60)
Glucose, Bld: 89 mg/dL (ref 70–99)
Potassium: 4.1 mmol/L (ref 3.5–5.1)
Sodium: 137 mmol/L (ref 135–145)

## 2021-07-22 LAB — PHOSPHORUS: Phosphorus: 2.5 mg/dL (ref 2.5–4.6)

## 2021-07-22 LAB — MAGNESIUM: Magnesium: 1.5 mg/dL — ABNORMAL LOW (ref 1.7–2.4)

## 2021-07-22 MED ORDER — MAGNESIUM SULFATE 2 GM/50ML IV SOLN
2.0000 g | Freq: Once | INTRAVENOUS | Status: AC
  Administered 2021-07-22: 2 g via INTRAVENOUS
  Filled 2021-07-22: qty 50

## 2021-07-22 MED ORDER — MUPIROCIN 2 % EX OINT
TOPICAL_OINTMENT | Freq: Two times a day (BID) | CUTANEOUS | Status: DC
  Filled 2021-07-22: qty 22

## 2021-07-22 MED ORDER — MAGNESIUM OXIDE -MG SUPPLEMENT 400 (240 MG) MG PO TABS
400.0000 mg | ORAL_TABLET | Freq: Two times a day (BID) | ORAL | Status: DC
  Administered 2021-07-22 – 2021-07-26 (×10): 400 mg via ORAL
  Filled 2021-07-22 (×10): qty 1

## 2021-07-22 NOTE — Progress Notes (Signed)
Hypoglycemic Event  CBG: 68  Treatment: 4 oz juice/soda  Symptoms: None  Follow-up CBG: Time:0651 CBG Result:78  Possible Reasons for Event: Inadequate meal intake and Unknown  Comments/MD notified: notified of pt CBG of 68 at 0620 by NT. Gave Juice at [phone]. Activated standing orders. Zierle-Ghosh MD was notified.     Philomena Course

## 2021-07-22 NOTE — Progress Notes (Signed)
Palliative Medicine Inpatient Follow Up Note  Consulting Provider: Geradine Girt, DO   Reason for consult:   [place] Palliative Medicine Consult  Reason for Consult? goc    HPI:  Per intake H&P --> Brad [name] is an 75 y.o. male with history of dementia, HTN, DM2, prostate CA, seizure disorder, substance abuse who lives in a nursing home and is bedbound with multiple contractures who was sent in from SNF for seizures. Palliative care was requested to get involved in the setting of end stage dementia to have additional goals of care conversations.  Today's Discussion (07/22/2021):  *Please note that this is a verbal dictation therefore any spelling or grammatical errors are due to the "[place] [employer]" system interpretation.  Family Meeting Participants: Brad [name] (sister)  Provider(s) Present: Tacey Ruiz, [facility]  I met with Brad [name] at bedside this morning.  She shares with me that she has given Brad [name] situation quite a bit of thought over the past 2 days.  We were able to review his past medical history inclusive of his progressive dementia in great detail.  I shared my worry that Brad [name] is at the end stage of dementia being that he cannot lift his head independently.  I shared that this indicates that he is presently encroaching his end-of-life journey.   Brad [name] expresses to me that I am seeing what I am seeing but she also is able to identify as Brad [name] sister a different assumption about his situation.  She shares that she is the most into in person with him and that healthcare professionals often do not understand him like she does. Brad [name] right upper prior experiences with palliative care providers and how she is felt "pushed" into making a decision.  I offered her the opportunity to express what her fears are through the input involvement of the palliative care team. Brad [name] expresses that she worries she will feel pressure to make  a decision regarding his care which would impact him tremendously.  She shares that as it is presently she is not ready to stop hoping for improvements. She feels that Brad [name]'s contractures are purely related to his arthritis and his "laziness" as opposed to worsening disease processes. I provided some education on late stages of dementia. We reviewed that everything Brad [name] is presently experiencing does indicate that he is in the inal stages and again that his time will be short.  We focused a great deal on oral intake and I shared health care professionals commonly rely on feeding tubes to supply nutrition to these severely demented patients. However, various studies have not shown use of feeding tubes to be effective in preventing malnutrition. Furthermore, they have not been demonstrated to prevent the occurrence or increase the healing of pressure sores, prevent aspiration pneumonia, provide comfort, improve functional status, or extend life. High complication rates, increased use of restraints, and other adverse effects further increase the burden of feeding tubes in severely demented patients. I stated that these will not be an option in Brad [name] case which Brad [name] understands.  Brad [name] expresses that she understands but does with to give him some more time to ideally be able to "wake up" and speak to her. She shares that today she cannot make decisions regarding end of life and she herself does not feel that these are her decisions to make. Provided support through therapeutic listening. She requests that our team remain "present". I stated that our goals are never to push patients or their  familles rather to provide a greater understanding of the big picture.  Brad [name] requests to speak to the social worker regarding placement options. I shared I don't think this will be the focus for sometime but she is clear on not wanting Brad [name] to go back to [facility].   Questions and concerns addressed    Objective Assessment: Vital Signs Vitals:   07/22/21 0418 07/22/21 0827  BP: 104/68 118/62  Pulse: 74 65  Resp: 18 16  Temp: 97.8 F (36.6 C) 97.6 F (36.4 C)  SpO2:  100%   No intake or output data in the 24 hours ending 07/22/21 1106 Last Weight  Most recent update: 07/19/2021  9:10 AM    Weight  57 kg (125 lb 10.6 oz)            Gen:  Elderly M in mild distress HEENT: (+) Oral thrush CV: Regular rate and rhythm PULM: On RA ABD: soft/nontender EXT: (+) pedal edema Neuro: Somnolent, non-verbal today  SUMMARY OF RECOMMENDATIONS   DNAR/DNI  Patients sister wishes to give the patient more time to see if he may become coherent   oral thrush: Continue on nystatin   Generalized arthritic pain: Tylenol  ATC  TOC team aware sister does not want to go back to [facility]   Ongoing incremental PMT support - Sister does not wish to feel "pushed" into decisions  Time Spent: 24 Greater than 50% of the time was spent in counseling and coordination of care ______________________________________________________________________________________ [place] Team Team Cell Phone: [phone] Please utilize secure chat with additional questions, if there is no response within 30 minutes please call the above phone number  Palliative Medicine Team providers are available by phone from 7am to 7pm daily and can be reached through the team cell phone.  Should this patient require assistance outside of these hours, please call the patient's attending physician.

## 2021-07-22 NOTE — Progress Notes (Signed)
PROGRESS NOTE  Brad [name] [id number] DOB: 26-Jun-1946 DOA: 07/17/2021 PCP: [name], DO   LOS: 5 days   Brief narrative: Brad [name] is an 75 y.o. male with history of dementia, hypertension, diabetes mellitus type 2, prostate CA, seizure disorder, history of substance abuse from skilled nursing facility, bedbound with multiple contractures was sent to the hospital with concerns for seizures.  During hospitalization, patient was noted to have ongoing seizures and neurology followed the patient.  Antiepileptic medications were adjusted.  Palliative care was consulted to discuss about goals of care.  Assessment/Plan:  Principal Problem:   Seizures ([place]) Active Problems:   Hypertension   Diabetes ([place])   Dementia ([facility])   Bedbound   Altered mental status likely metabolic encephalopathy likely secondary to seizures and postictal state. Patient does have baseline dementia but at baseline able to communicate.  Has contractures.  Palliative care on board.  Seizure disorder  Neurology is following.  Valproic acid was subtherapeutic on presentation.  Therapeutic levels achieved on 07/20/2021.   EEG showed seizures with underlying encephalopathy.  Was on continuous EEG as per neurology recommendation.  Continuous EEG will be discontinued today.  On maintenance Keppra 500 twice daily, Depakote 500 mg 3 times daily.  Neurology recommends outpatient follow-up in 10 to 12 weeks.   Oral thrush.  We will continue nystatin.   Essential HTN Continue amlodipine.  Closely monitor blood pressure.  Blood pressure seems to be stable  Failure to thrive, severe malnourishment.Overall poor prognosis..  Increase oral nutrition.   Anxiety and depression Continue mirtazapine and fluoxetine   Chronic pain Tramadol has been discontinued in view of seizures.   Hypokalemia Replenished.  Potassium has improved to 4.1 today.   Significant hypomagnesemia.  Plan is to replenish, will replace  with 2 g of magnesium sulfate again today.   High TSH But normal T4.  Check thyroid profile on in few weeks.   Hypoglycemia Initially.  Improved at this time.   Goals for care Palliative care on board.  We will continue to discuss goals of care.  Patient has overall poor prognosis.  Updated the patient's sister about it.  DVT prophylaxis: enoxaparin (LOVENOX) injection 40 mg Start: 07/17/21 1430  Code Status: DNR DNI  Family Communication: I spoke with the patient's sister and updated her about the clinical condition of the patient.    Status is: Inpatient  Remains inpatient appropriate because:Unsafe d/c plan and IV treatments appropriate due to intensity of illness or inability to take PO  Dispo: The patient is from: Home              Anticipated d/c is to: SNF with question palliative care              Patient currently is not medically stable to d/c.   Difficult to place patient No  Consultants: Neurology Palliative care  Procedures: EEG  Anti-infectives:  None  Anti-infectives (From admission, onward)    None      Subjective: Today, patient was seen and examined at bedside.  Patient saying yeah only.  No significant improvement noted.  Objective: Vitals:   07/22/21 0418 07/22/21 0827  BP: 104/68 118/62  Pulse: 74 65  Resp: 18 16  Temp: 97.8 F (36.6 C) 97.6 F (36.4 C)  SpO2:  100%   No intake or output data in the 24 hours ending 07/22/21 1232 Filed Weights   07/19/21 0900  Weight: 57 kg   Body mass index is 16.58 kg/m.  Physical Exam:  General: Thinly built, alert awake, minimally verbal but unintelligible speech, disoriented, deconditioned and debilitated, contracted male appears frail.   HENT:   No scleral pallor or icterus noted. Oral mucosa with white discharge. Chest: Diminished breath sounds bilaterally. CVS: S1 &S2 heard. No murmur.  Regular rate and rhythm. Abdomen: Soft, nontender, nondistended.  Bowel sounds are heard.    Extremities: Contracted extremities. Psych: Alert awake but unintelligible speech.  Contracted extremities. CNS:  No cranial nerve deficits.  Power equal in all extremities.   Skin: Warm and dry.  No rashes noted.  Data Review: I have personally reviewed the following laboratory data and studies,  CBC: Recent Labs  Lab 07/17/21 0659 07/17/21 1418 07/18/21 0237 07/21/21 0518 07/22/21 0401  WBC 8.3 12.5* 8.4 12.1* 11.7*  NEUTROABS 6.2  --   --   --   --   HGB 10.8* 10.1* 8.9* 11.7* 11.3*  HCT 34.6* 31.6* 28.4* 36.0* 34.4*  MCV 96.9 95.2 95.9 91.8 93.2  PLT 289 283 261 294 [id number]   Basic Metabolic Panel: Recent Labs  Lab 07/17/21 0659 07/17/21 1418 07/18/21 0237 07/20/21 0223 07/21/21 0518 07/22/21 0401  NA 134*  --  135 136 133* 137  K 4.2  --  3.4* 3.5 3.3* 4.1  CL 99  --  100 101 96* 103  CO2 26  --  '27 24 24 26  '$ GLUCOSE 75  --  67* 113* 91 89  BUN 13  --  15 8 6* 7*  CREATININE 0.59* 0.66 0.64 0.50* 0.45* 0.52*  CALCIUM 8.9  --  8.7* 8.8* 8.9 8.6*  MG  --   --   --   --  1.1* 1.5*  PHOS  --   --   --   --   --  2.5   Liver Function Tests: Recent Labs  Lab 07/17/21 0659  AST 20  ALT 14  ALKPHOS 62  BILITOT 0.8  PROT 6.7  ALBUMIN 2.6*   No results for input(s): LIPASE, AMYLASE in the last 168 hours. Recent Labs  Lab 07/18/21 0954 07/20/21 0813  AMMONIA 16 36*   Cardiac Enzymes: Recent Labs  Lab 07/18/21 0237  CKTOTAL 101   BNP (last 3 results) No results for input(s): BNP in the last 8760 hours.  ProBNP (last 3 results) No results for input(s): PROBNP in the last 8760 hours.  CBG: Recent Labs  Lab 07/21/21 1129 07/22/21 0146 07/22/21 0615 07/22/21 0650 07/22/21 0828  GLUCAP 89 109* 68* 78 89   Recent Results (from the past 240 hour(s))  Resp Panel by RT-PCR (Flu A&B, Covid) Nasopharyngeal Swab     Status: None   Collection Time: 07/17/21  6:59 AM   Specimen: Nasopharyngeal Swab; Nasopharyngeal(NP) swabs in vial transport medium   Result Value Ref Range Status   SARS Coronavirus 2 by RT PCR NEGATIVE NEGATIVE Final    Comment: (NOTE) SARS-CoV-2 target nucleic acids are NOT DETECTED.  The SARS-CoV-2 RNA is generally detectable in upper respiratory specimens during the acute phase of infection. The lowest concentration of SARS-CoV-2 viral copies this assay can detect is 138 copies/mL. A negative result does not preclude SARS-Cov-2 infection and should not be used as the sole basis for treatment or other patient management decisions. A negative result may occur with  improper specimen collection/handling, submission of specimen other than nasopharyngeal swab, presence of viral mutation(s) within the areas targeted by this assay, and inadequate number of viral copies(<138 copies/mL). A negative result must  be combined with clinical observations, patient history, and epidemiological information. The expected result is Negative.  Fact Sheet for Patients:  EntrepreneurPulse.com.au  Fact Sheet for Healthcare Providers:  IncredibleEmployment.be  This test is no t yet approved or cleared by the [place] FDA and  has been authorized for detection and/or diagnosis of SARS-CoV-2 by FDA under an Emergency Use Authorization (EUA). This EUA will remain  in effect (meaning this test can be used) for the duration of the COVID-19 declaration under Section 564(b)(1) of the Act, 21 U.S.C.section 360bbb-3(b)(1), unless the authorization is terminated  or revoked sooner.       Influenza A by PCR NEGATIVE NEGATIVE Final   Influenza B by PCR NEGATIVE NEGATIVE Final    Comment: (NOTE) The Xpert Xpress SARS-CoV-2/FLU/RSV plus assay is intended as an aid in the diagnosis of influenza from Nasopharyngeal swab specimens and should not be used as a sole basis for treatment. Nasal washings and aspirates are unacceptable for Xpert Xpress SARS-CoV-2/FLU/RSV testing.  Fact Sheet for  Patients: EntrepreneurPulse.com.au  Fact Sheet for Healthcare Providers: IncredibleEmployment.be  This test is not yet approved or cleared by the [place] FDA and has been authorized for detection and/or diagnosis of SARS-CoV-2 by FDA under an Emergency Use Authorization (EUA). This EUA will remain in effect (meaning this test can be used) for the duration of the COVID-19 declaration under Section 564(b)(1) of the Act, 21 U.S.C. section 360bbb-3(b)(1), unless the authorization is terminated or revoked.  Performed at [facility], [place] [address] [place], [place] [address]      Studies: No results found.   Flora Lipps, MD  [facility] 07/22/2021  If 7PM-7AM, please contact night-coverage

## 2021-07-22 NOTE — Consult Note (Signed)
WOC Nurse Consult Note: Patient receiving care in [facility] [phone]. Patient's sister and niece present at time of skin assessment. Reason for Consult: multiple wounds Wound type: Right first metatarsal head with a small abrasion area that measures 0.5 cm x 1.1 cm x no measureable depth, pink in color.  There was a piece of xeroform over the area with brown, dry drainage.  For this area: Wash right first metatarsal head wound along the great toe area with soap and water, pat dry. Place a small piece of xerforom over it and secure with a few turns of kerlix. Change daily.  The right trochanter area has a linear DTPI that measures 0.3 cm x 6 cm and is maroon in  color.  A foam dressing every 3 days and prn soilage will be useful for this area.  The right knee has 2 small, superficial abrasions pink in color.  For these areas: Wash the abrasions on the right knee with soap and water, pat dry. Place foam dressing over the areas. Right lateral 5th toe has a DTPI that measures 1 cm x 0.5 cm that is maroon in color.  It does not need a dressing at this time.  Right lateral heel has a wound that measures 1.2 cm x 1.6 cm x 0.3 cm and is red, yellow and green, and has green drainage.  This is a stage 3 PI.  For this: Wash with soap and water, then apply Mupirocin, cover with small foam dressing. Pressure Injury POA: No Measurement: Wound bed: Drainage (amount, consistency, odor)  Periwound: The patient has dry, scaling skin over the total body. Dressing procedure/placement/frequency: I have also asked the [place] to order a standard size bed with air mattress and 2 Prevalon boots.

## 2021-07-22 NOTE — Progress Notes (Signed)
Hypoglycemic Event   CBG: 64   Treatment: 12.5 mL dextrose 50% solution    Symptoms: None   Follow-up CBG: Time:1419   CBG Result: 103   Possible Reasons for Event: Inadequate meal intake and Unknown   Comments/MD notified: notified of pt CBG of 64 at 1505 by RN. Gave dextrose at 1358 Activated standing orders. Pokhrel MD was notified.

## 2021-07-22 NOTE — Progress Notes (Addendum)
Subjective: No clinical seizures overnight.  Patient's Sister Oren Binet and niece Miracle at bedside  ROS: Unable to obtain due to poor mental status  Examination  Vital signs in last 24 hours: Temp:  [97.5 F (36.4 C)-98.1 F (36.7 C)] 97.6 F (36.4 C) (08/05 0827) Pulse Rate:  [65-89] 65 (08/05 0827) Resp:  [16-20] 16 (08/05 0827) BP: (104-122)/(61-73) 118/62 (08/05 0827) SpO2:  [93 %-100 %] 100 % (08/05 0827)  General: lying in bed, NAD CVS: pulse-normal rate and rhythm RS: breathing comfortably, symmetric chest expansion bilaterally Extremities: normal, contractures Neuro: Asleep but did wake up to repeated tactile stimulation, able to tell me his name, not oriented to time or place, was unable to recognize his sister, able to follow simple commands like sticking out his tongue once, did not name objects, perseverated on his name, PERRLA, no forced gaze deviation, blinks to threat bilaterally, Flexed contractures with rigidity throughout each extremity with minimal spontaneous movement noted. Bulk is significantly decreased throughout.   Basic Metabolic Panel: Recent Labs  Lab 07/17/21 0659 07/17/21 1418 07/18/21 0237 07/20/21 0223 07/21/21 0518 07/22/21 0401  NA 134*  --  135 136 133* 137  K 4.2  --  3.4* 3.5 3.3* 4.1  CL 99  --  100 101 96* 103  CO2 26  --  '27 24 24 26  '$ GLUCOSE 75  --  67* 113* 91 89  BUN 13  --  15 8 6* 7*  CREATININE 0.59* 0.66 0.64 0.50* 0.45* 0.52*  CALCIUM 8.9  --  8.7* 8.8* 8.9 8.6*  MG  --   --   --   --  1.1* 1.5*  PHOS  --   --   --   --   --  2.5    CBC: Recent Labs  Lab 07/17/21 0659 07/17/21 1418 07/18/21 0237 07/21/21 0518 07/22/21 0401  WBC 8.3 12.5* 8.4 12.1* 11.7*  NEUTROABS 6.2  --   --   --   --   HGB 10.8* 10.1* 8.9* 11.7* 11.3*  HCT 34.6* 31.6* 28.4* 36.0* 34.4*  MCV 96.9 95.2 95.9 91.8 93.2  PLT 289 283 261 294 264     Coagulation Studies: No results for input(s): LABPROT, INR in the last 72 hours.  Imaging No  new brain imaging overnight  ASSESSMENT AND PLAN29 year old male with history of seizures on Depakote, polysubstance abuse, hypertension, diabetes, prostate cancer and depression, poor functional baseline and bedbound but conversant and oriented to self and place at baseline who presented initially with prolonged seizure at [facility].   Epilepsy with breakthrough seizure Acute encephalopathy, improving -Breakthrough seizure in the setting of subtherapeutic Depakote levels -Encephalopathy likely due to postictal state, medications, worsening dementia in a patient with poor neurological   Recommendations -Discontinue LTM EEG -Continue Depakote 500 mg every 8 hours, Keppra 500 mg twice daily -If patient has any further clinical seizure, Keppra can be increased to 750 mg twice daily.  Avoid increasing Depakote as patient had hyperammonemia at higher doses -Patient appears malnourished, had subtherapeutic Depakote levels on presentation and has had gradual worsening pain overall health.  I discussed my concerns with patient sister at bedside that he has poor neurological baseline and therefore would likely have prolonged recovery ( weeks to months).  I also shared that it is very likely that he will NOT recover to his pre- admission baseline.  I recommended discussing further goals of care with palliative care, medicine team and social worker -PT/OT, nutrition evaluation -Continue seizure precautions -  as needed IV Ativan 2 mg for clinical seizure-like activity -Management of rest of comorbidities per primary team -Follow-up with neurology in 10 to 12 weeks after discharge  Seizure precautions: Per [facility] statutes, patients with seizures are not allowed to drive until they have been seizure-free for six months and cleared by a physician    Use caution when using heavy equipment or power tools. Avoid working on ladders or at heights. Take showers instead of baths. Ensure the water temperature  is not too high on the home water heater. Do not go swimming alone. Do not lock yourself in a room alone (i.e. bathroom). When caring for infants or small children, sit down when holding, feeding, or changing them to minimize risk of injury to the child in the event you have a seizure. Maintain good sleep hygiene. Avoid alcohol.    If patient has another seizure, call 911 and bring them back to the ED if: A.  The seizure lasts longer than 5 minutes.      B.  The patient doesn't wake shortly after the seizure or has new problems such as difficulty seeing, speaking or moving following the seizure C.  The patient was injured during the seizure D.  The patient has a temperature over 102 F (39C) E.  The patient vomited during the seizure and now is having trouble breathing    During the Seizure   - First, ensure adequate ventilation and place patients on the floor on their left side  Loosen clothing around the neck and ensure the airway is patent. If the patient is clenching the teeth, do not force the mouth open with any object as this can cause severe damage - Remove all items from the surrounding that can be hazardous. The patient may be oblivious to what's happening and may not even know what he or she is doing. If the patient is confused and wandering, either gently guide him/her away and block access to outside areas - Reassure the individual and be comforting - Call 911. In most cases, the seizure ends before EMS arrives. However, there are cases when seizures may last over 3 to 5 minutes. Or the individual may have developed breathing difficulties or severe injuries. If a pregnant patient or a person with diabetes develops a seizure, it is prudent to call an ambulance. - Finally, if the patient does not regain full consciousness, then call EMS. Most patients will remain confused for about 45 to 90 minutes after a seizure, so you must use judgment in calling for help. - Avoid restraints but make  sure the patient is in a bed with padded side rails - Place the individual in a lateral position with the neck slightly flexed; this will help the saliva drain from the mouth and prevent the tongue from falling backward - Remove all nearby furniture and other hazards from the area - Provide verbal assurance as the individual is regaining consciousness - Provide the patient with privacy if possible - Call for help and start treatment as ordered by the caregiver    After the Seizure (Postictal Stage)   After a seizure, most patients experience confusion, fatigue, muscle pain and/or a headache. Thus, one should permit the individual to sleep. For the next few days, reassurance is essential. Being calm and helping reorient the person is also of importance.   Most seizures are painless and end spontaneously. Seizures are not harmful to others but can lead to complications such as stress on  the lungs, brain and the heart. Individuals with prior lung problems may develop labored breathing and respiratory distress.    Thank you for allowing [place] to participate in the care of this patient.  Neurology will sign off.  Please call us for any further questions.   I have spent a total of  35  minutes with the patient reviewing hospital notes,  test results, labs and examining the patient as well as establishing an assessment and plan that was discussed with patient's sister at bedside.  > 50% of time was spent in direct patient care.    Zeb Comfort Epilepsy [facility] For questions after 5pm please refer to AMION to reach the Neurologist on call

## 2021-07-22 NOTE — Progress Notes (Signed)
LTM EEG discontinued - no skin breakdown at unhook.   

## 2021-07-22 NOTE — Progress Notes (Signed)
  Speech Language Pathology Treatment: Dysphagia  Patient Details Name: Brad [name] MRN: [id number] DOB: 1946/01/05 Today's Date: 07/22/2021 Time: [id number] SLP Time Calculation (min) (ACUTE ONLY): 21 min  Assessment / Plan / Recommendation Clinical Impression  Pt very lethargic upon SLP arrival with RN reporting low blood sugar, which she was monitoring. With verbal and tactile stimulation cues with damp washcloth to face, pt awakened for PO intake and remained alert and responsive to simple yes/no questions. NT reports pt doing well with purees included on full liquid tray during mealtimes. She notes that with liquid washes he is able to clear his oral cavity of any residuals. Trials of pudding, jello and thin liquids administered this date for skilled observation with RN at bedside. Pt spontaneously performed x1 repeat dry swallows after each bite and sip without evidence of oral residuals after intake. In x1 instance when time for a repeat swallow was not given, pt demonstrated overt coughing with bite of jello. Suspect repeat swallows are functional for clearance of oral cavity and possible pharyngeal residuals. He exhibited belching after larger sips of thin liquids via straw, but otherwise no overt s/sx noted with thin liquids. Recommend dysphagia 1 (puree), thin liquid diet with SLP to f/u for tolerance.   HPI HPI: Patient is a 75 y.o. male with PMH: dementia, HTN, DM2, prostate CA, seizure disorder, substance abuse who lives in a nursing home and is bedbound with multiple contractures who was sent in from a [facility] for seizures.  EMS was called and he was noted to have right gaze preference with facial twitching.  He was treated with Versed with some improvement and then seizures recurred again. He has h/o dysphagia and has been seen by ST services during previous admissions. MBS in 2020 revealed mild pharyngeal phase dysphagia but esophagram that same day revealed moderate  esophageal dysmotility with no evidence of esophageal stricture or hiatal hernia.      SLP Plan  Continue with current plan of care       Recommendations  Diet recommendations: Dysphagia 1 (puree);Thin liquid Liquids provided via: Cup;Straw Medication Administration: Crushed with puree Supervision: Full supervision/cueing for compensatory strategies;Staff to assist with self feeding Compensations: Minimize environmental distractions;Slow rate;Small sips/bites;Other (Comment) (allow pt to perform spontaneous repeat dry swallow after bites/sips) Postural Changes and/or Swallow Maneuvers: Seated upright 90 degrees;Upright 30-60 min after meal                Oral Care Recommendations: Oral care BID;Staff/trained caregiver to provide oral care Follow up Recommendations: Skilled Nursing facility SLP Visit Diagnosis: Dysphagia, unspecified (R13.10) Plan: Continue with current plan of care       [place], [place], [place] Office Number: [phone]   Brad [name] 07/22/2021, 2:18 PM

## 2021-07-22 NOTE — Plan of Care (Signed)

## 2021-07-22 NOTE — Procedures (Addendum)
Patient Name: Brad [name]  MRN: [id number]  Epilepsy Attending: Lora Havens  Referring Physician/Provider: Clance Boll, NP Duration: 8/4/20221158 to 07/22/2021 1239   Patient history:  75yo M with h/o epilepsy presented with seizure described as gaze to right and facial twitching upon EMS arrival. EEG to evaluate for seizure   Level of alertness:  awake asleep   AEDs during EEG study: VPA, LEV   Technical aspects: This EEG study was done with scalp electrodes positioned according to the 10-20 International system of electrode placement. Electrical activity was acquired at a sampling rate of '500Hz'$  and reviewed with a high frequency filter of '70Hz'$  and a low frequency filter of '1Hz'$ . EEG data were recorded continuously and digitally stored.   Description: The posterior dominant rhythm consists of 8 Hz activity of moderate voltage (25-35 uV) seen predominantly in posterior head regions, asymmetric ( left<right)  and reactive to eye opening and eye closing. Sleep was characterized by sleep spindles (12 to '14Hz'$ ) EEG showed continuous generalized and lateralized left hemisphere 3 to 6 Hz theta-delta slowing. Frequent spikes and polyspikes were noted in left frontal region.     ABNORMALITY - Spikes, left frontal region - Continuous slow, generalized and lateralized left hemisphere   IMPRESSION: This study showed evidence of epileptogenicity arising from left frontal region. There is also cortical dysfunction in left hemisphere likely secondary to underlying structural abnormality, postictal state.  Additionally there is moderate diffuse encephalopathy, nonspecific etiology.  No seizures were seen during the study   [place]

## 2021-07-23 DIAGNOSIS — R569 Unspecified convulsions: Secondary | ICD-10-CM

## 2021-07-23 DIAGNOSIS — Z515 Encounter for palliative care: Secondary | ICD-10-CM | POA: Diagnosis not present

## 2021-07-23 LAB — BASIC METABOLIC PANEL
Anion gap: 8 (ref 5–15)
BUN: 6 mg/dL — ABNORMAL LOW (ref 8–23)
CO2: 26 mmol/L (ref 22–32)
Calcium: 8.6 mg/dL — ABNORMAL LOW (ref 8.9–10.3)
Chloride: 103 mmol/L (ref 98–111)
Creatinine, Ser: 0.52 mg/dL — ABNORMAL LOW (ref 0.61–1.24)
GFR, Estimated: >60 mL/min (ref >60)
Glucose, Bld: 75 mg/dL (ref 70–99)
Potassium: 4.1 mmol/L (ref 3.5–5.1)
Sodium: 137 mmol/L (ref 135–145)

## 2021-07-23 LAB — GLUCOSE, CAPILLARY
Glucose-Capillary: 68 mg/dL — ABNORMAL LOW (ref 70–99)
Glucose-Capillary: 135 mg/dL — ABNORMAL HIGH (ref 70–99)
Glucose-Capillary: 89 mg/dL (ref 70–99)
Glucose-Capillary: 73 mg/dL (ref 70–99)
Glucose-Capillary: 72 mg/dL (ref 70–99)

## 2021-07-23 LAB — MAGNESIUM: Magnesium: 1.5 mg/dL — ABNORMAL LOW (ref 1.7–2.4)

## 2021-07-23 MED ORDER — MAGNESIUM SULFATE 2 GM/50ML IV SOLN
2.0000 g | Freq: Once | INTRAVENOUS | Status: AC
  Administered 2021-07-23: 2 g via INTRAVENOUS
  Filled 2021-07-23: qty 50

## 2021-07-23 MED ORDER — FENTANYL CITRATE (PF) 100 MCG/2ML IJ SOLN
12.5000 ug | INTRAMUSCULAR | Status: AC | PRN
  Administered 2021-07-23 – 2021-07-25 (×5): 12.5 ug via INTRAVENOUS
  Filled 2021-07-23 (×5): qty 2

## 2021-07-23 NOTE — Progress Notes (Signed)
PROGRESS NOTE    Brad [name]  [id number] DOB: [birth year] DOA: 07/17/2021 PCP: [name], DO    Brief Narrative:  Brad [name] is an 75 y.o. male with history of dementia, hypertension, diabetes mellitus type 2, prostate CA, seizure disorder, history of substance abuse from skilled nursing facility, bedbound with multiple contractures was sent to the hospital with concerns for seizures.   During hospitalization, patient was noted to have ongoing seizures and neurology followed the patient.  Antiepileptic medications were adjusted.  Palliative care was consulted to discuss about goals of care.  Assessment & Plan:   Principal Problem:   Seizures ([place]) Active Problems:   Hypertension   Diabetes ([place])   Dementia ([facility])   Bedbound  Altered mental status likely metabolic encephalopathy likely secondary to seizures and postictal state. Patient does have baseline dementia but at baseline able to communicate.  Has contractures.  Palliative care on board.   Seizure disorder  Neurology is following.  Valproic acid was subtherapeutic on presentation.  Therapeutic levels achieved on 07/20/2021.   EEG showed seizures with underlying encephalopathy.  Was on continuous EEG as per neurology recommendation.  On maintenance Keppra 500 twice daily, Depakote 500 mg 3 times daily.  Neurology recommends outpatient follow-up in 10 to 12 weeks.   Oral thrush.  We will continue nystatin.   Essential HTN Continue amlodipine.  Closely monitor blood pressure.  Blood pressure seems to be stable   Failure to thrive, severe malnourishment.Overall poor prognosis..  Increase oral nutrition.   Anxiety and depression Continue mirtazapine and fluoxetine   Chronic pain Tramadol has been discontinued in view of seizures.   Hypokalemia Replenished.  Potassium has improved to 4.1 today.   Significant hypomagnesemia.  Plan is to replenish, will replace with 2 g of magnesium sulfate again today.   High  TSH But normal T4.  Check thyroid profile on in few weeks.   Hypoglycemia Initially.  Improved at this time.   Goals for care Palliative care on board.  We will continue to discuss goals of care.  Patient has overall poor prognosis.  Updated the patient's sister about it.   DVT prophylaxis: Lovenox SQ Code Status: DNR  Family Communication: None at bedside Disposition Plan: SNF   Consultants:  Neurology Palliative care  Procedures: EEG  Antimicrobials: Anti-infectives (From admission, onward)    None        Subjective: Patient remains minimally responsive and interactive.  Objective: Vitals:   07/23/21 0745 07/23/21 0921 07/23/21 1205 07/23/21 1207  BP: 129/78 103/62 115/78   Pulse: 85  89   Resp: 16  16   Temp: 97.8 F (36.6 C)   (!) 97.5 F (36.4 C)  TempSrc: Oral   Oral  SpO2: 98%  92%   Weight:        Intake/Output Summary (Last 24 hours) at 07/23/2021 1436 Last data filed at 07/23/2021 0800 Gross per 24 hour  Intake 589 ml  Output 750 ml  Net -161 ml   Filed Weights   07/19/21 0900  Weight: 57 kg    Examination:  General exam: Appears calm, quite thin, contractures noted Respiratory system: Clear to auscultation. Respiratory effort normal. Cardiovascular system: S1 & S2 heard, RRR.  Gastrointestinal system: Abdomen is nondistended, soft and nontender.  Central nervous system: Awake not necessarily oriented contractures noted Extremities: Symmetric  Skin: No rashes  Data Reviewed: I have personally reviewed following labs and imaging studies  CBC: Recent Labs  Lab 07/17/21 0659 07/17/21  1418 07/18/21 0237 07/21/21 0518 07/22/21 0401  WBC 8.3 12.5* 8.4 12.1* 11.7*  NEUTROABS 6.2  --   --   --   --   HGB 10.8* 10.1* 8.9* 11.7* 11.3*  HCT 34.6* 31.6* 28.4* 36.0* 34.4*  MCV 96.9 95.2 95.9 91.8 93.2  PLT 289 283 261 294 [id number]   Basic Metabolic Panel: Recent Labs  Lab 07/18/21 0237 07/20/21 0223 07/21/21 0518 07/22/21 0401  07/23/21 0333  NA 135 136 133* 137 137  K 3.4* 3.5 3.3* 4.1 4.1  CL 100 101 96* 103 103  CO2 '27 24 24 26 26  '$ GLUCOSE 67* 113* 91 89 75  BUN 15 8 6* 7* 6*  CREATININE 0.64 0.50* 0.45* 0.52* 0.52*  CALCIUM 8.7* 8.8* 8.9 8.6* 8.6*  MG  --   --  1.1* 1.5* 1.5*  PHOS  --   --   --  2.5  --    GFR: Estimated Creatinine Clearance: 66.3 mL/min (A) (by C-G formula based on SCr of 0.52 mg/dL (L)). Liver Function Tests: Recent Labs  Lab 07/17/21 0659  AST 20  ALT 14  ALKPHOS 62  BILITOT 0.8  PROT 6.7  ALBUMIN 2.6*    Recent Labs  Lab 07/18/21 0954 07/20/21 0813  AMMONIA 16 36*    Cardiac Enzymes: Recent Labs  Lab 07/18/21 0237  CKTOTAL 101    CBG: Recent Labs  Lab 07/22/21 1418 07/23/21 0333 07/23/21 0440 07/23/21 0619 07/23/21 1206  GLUCAP 103* 68* 135* 89 73    Sepsis Labs:   Recent Results (from the past 240 hour(s))  Resp Panel by RT-PCR (Flu A&B, Covid) Nasopharyngeal Swab     Status: None   Collection Time: 07/17/21  6:59 AM   Specimen: Nasopharyngeal Swab; Nasopharyngeal(NP) swabs in vial transport medium  Result Value Ref Range Status   SARS Coronavirus 2 by RT PCR NEGATIVE NEGATIVE Final    Comment: (NOTE) SARS-CoV-2 target nucleic acids are NOT DETECTED.  The SARS-CoV-2 RNA is generally detectable in upper respiratory specimens during the acute phase of infection. The lowest concentration of SARS-CoV-2 viral copies this assay can detect is 138 copies/mL. A negative result does not preclude SARS-Cov-2 infection and should not be used as the sole basis for treatment or other patient management decisions. A negative result may occur with  improper specimen collection/handling, submission of specimen other than nasopharyngeal swab, presence of viral mutation(s) within the areas targeted by this assay, and inadequate number of viral copies(<138 copies/mL). A negative result must be combined with clinical observations, patient history, and  epidemiological information. The expected result is Negative.  Fact Sheet for Patients:  EntrepreneurPulse.com.au  Fact Sheet for Healthcare Providers:  IncredibleEmployment.be  This test is no t yet approved or cleared by the [place] FDA and  has been authorized for detection and/or diagnosis of SARS-CoV-2 by FDA under an Emergency Use Authorization (EUA). This EUA will remain  in effect (meaning this test can be used) for the duration of the COVID-19 declaration under Section 564(b)(1) of the Act, 21 U.S.C.section 360bbb-3(b)(1), unless the authorization is terminated  or revoked sooner.       Influenza A by PCR NEGATIVE NEGATIVE Final   Influenza B by PCR NEGATIVE NEGATIVE Final    Comment: (NOTE) The Xpert Xpress SARS-CoV-2/FLU/RSV plus assay is intended as an aid in the diagnosis of influenza from Nasopharyngeal swab specimens and should not be used as a sole basis for treatment. Nasal washings and aspirates are unacceptable for Xpert  Xpress SARS-CoV-2/FLU/RSV testing.  Fact Sheet for Patients: EntrepreneurPulse.com.au  Fact Sheet for Healthcare Providers: IncredibleEmployment.be  This test is not yet approved or cleared by the [place] FDA and has been authorized for detection and/or diagnosis of SARS-CoV-2 by FDA under an Emergency Use Authorization (EUA). This EUA will remain in effect (meaning this test can be used) for the duration of the COVID-19 declaration under Section 564(b)(1) of the Act, 21 U.S.C. section 360bbb-3(b)(1), unless the authorization is terminated or revoked.  Performed at [facility], [place] [address] [place], [place] [address]       Radiology Studies: No results found.   Scheduled Meds:  acetaminophen  500 mg Oral TID   amLODipine  2.5 mg Oral Daily   atorvastatin  20 mg Oral Daily   divalproex  500 mg Oral Q8H   enoxaparin (LOVENOX) injection   40 mg Subcutaneous Q24H   FLUoxetine  40 mg Oral Daily   levETIRAcetam  500 mg Oral BID   magnesium oxide  400 mg Oral BID   mirtazapine  30 mg Oral QHS   mupirocin ointment   Topical BID   nystatin  5 mL Oral QID   thiamine injection  100 mg Intravenous Daily   Or   thiamine  100 mg Oral Daily   Continuous Infusions:   LOS: 6 days    Donnamae Jude, MD 07/23/2021 2:36 PM [phone] [facility] If 7PM-7AM, please contact night-coverage 07/23/2021, 2:36 PM

## 2021-07-23 NOTE — Progress Notes (Signed)
   Palliative Medicine Inpatient Follow Up Note  Consulting Provider: Geradine Girt, DO   Reason for consult:   [place] Palliative Medicine Consult  Reason for Consult? goc    HPI:  Per intake H&P --> Brad [name] is an 75 y.o. male with history of dementia, HTN, DM2, prostate CA, seizure disorder, substance abuse who lives in a nursing home and is bedbound with multiple contractures who was sent in from SNF for seizures. Palliative care was requested to get involved in the setting of end stage dementia to have additional goals of care conversations.  Today's Discussion (07/23/2021):  *Please note that this is a verbal dictation therefore any spelling or grammatical errors are due to the "[place] [facility]" system interpretation.  Chart reviewed.   I met this morning with Brad [name], Brad [name]. She shares that Melvern was in a little pain this morning requiring tylenol and repositioning which does seem to have helped. She shares that she were able to feed him some of his breakfast.  Upon assessment, Lewi appears more alert and is able to respond to me when I asked if he was hurting. He shared at that time that he was.  I spoke to nursing regarding a plan to add low dose fentanyl Q3H PRN for severe pain.   No family is present at bedside.   Objective Assessment: Vital Signs Vitals:   07/23/21 1207 07/23/21 1553  BP:  (!) 104/54  Pulse:  76  Resp:  18  Temp: (!) 97.5 F (36.4 C) 97.9 F (36.6 C)  SpO2:  91%    Intake/Output Summary (Last 24 hours) at 07/23/2021 1601 Last data filed at 07/23/2021 0800 Gross per 24 hour  Intake 589 ml  Output 750 ml  Net -161 ml   Last Weight  Most recent update: 07/19/2021  9:10 AM    Weight  57 kg (125 lb 10.6 oz)            Gen:  Elderly M in mild distress HEENT: (+) Oral thrush CV: Regular rate and rhythm PULM: On RA ABD: soft/nontender EXT: (+) pedal edema Neuro: Somnolent, non-verbal today  SUMMARY  OF RECOMMENDATIONS   DNAR/DNI  Patients sister wishes to give the patient more time to see if he may become more coherent   oral thrush: Improving, Continue on nystatin   Generalized arthritic pain: Tylenol  ATC; Fentanyl 12.34mcg Q3H PRN   TOC team aware sister does not want to go back to [facility]   Ongoing incremental PMT support - Sister does not wish to feel "pushed" into decisions  Time Spent: 25 Greater than 50% of the time was spent in counseling and coordination of care ______________________________________________________________________________________ [place] Team Team Cell Phone: [phone] Please utilize secure chat with additional questions, if there is no response within 30 minutes please call the above phone number  Palliative Medicine Team providers are available by phone from 7am to 7pm daily and can be reached through the team cell phone.  Should this patient require assistance outside of these hours, please call the patient's attending physician.

## 2021-07-24 DIAGNOSIS — I10 Essential (primary) hypertension: Secondary | ICD-10-CM | POA: Diagnosis not present

## 2021-07-24 LAB — GLUCOSE, CAPILLARY
Glucose-Capillary: 128 mg/dL — ABNORMAL HIGH (ref 70–99)
Glucose-Capillary: 135 mg/dL — ABNORMAL HIGH (ref 70–99)
Glucose-Capillary: 54 mg/dL — ABNORMAL LOW (ref 70–99)
Glucose-Capillary: 54 mg/dL — ABNORMAL LOW (ref 70–99)
Glucose-Capillary: 58 mg/dL — ABNORMAL LOW (ref 70–99)
Glucose-Capillary: 67 mg/dL — ABNORMAL LOW (ref 70–99)
Glucose-Capillary: 67 mg/dL — ABNORMAL LOW (ref 70–99)
Glucose-Capillary: 67 mg/dL — ABNORMAL LOW (ref 70–99)
Glucose-Capillary: 89 mg/dL (ref 70–99)

## 2021-07-24 MED ORDER — LACTATED RINGERS IV SOLN: INTRAVENOUS | Status: DC

## 2021-07-24 NOTE — Progress Notes (Signed)
PROGRESS NOTE    Brad [name]  [id number] DOB: [birth year] DOA: 07/17/2021 PCP: [name], DO    No chief complaint on file.   Brief Narrative:  Brad [name] is an 75 y.o. male with history of dementia, hypertension, diabetes mellitus type 2, prostate CA, seizure disorder, history of substance abuse from skilled nursing facility, bedbound with multiple contractures was sent to the hospital with concerns for seizures.   During hospitalization, patient was noted to have ongoing seizures and neurology followed the patient.  Antiepileptic medications were adjusted.  Palliative care was consulted to discuss about goals of care.    Subjective:  He is sleeping, open eyes to voice, he does  not follow commands He has very poor oral intake No documented seizures last 24hrs   Assessment & Plan:   Principal Problem:   Seizures ([place]) Active Problems:   Hypertension   Diabetes ([place])   Dementia ([place])   Bedbound   Metabolic encephalopathy secondary to seizures and postictal state Evaluated by neurology, antibiotic medication adjusted, no documented seizure in last 24 hours -He remains lethargic, with poor oral intake -Palliative care continue following, patient is DNR/DNI, but family is not ready for comfort measures   Essential hypertension Blood pressure low normal Discontinue Norvasc , start hydration due to poor oral intake    Failure to thrive, [place] malnourishment, underlying dementia Continue goals of care discussion   Body mass index is 16.58 kg/m.[place]   Skin Assessment:  I have examined the patient's skin and I agree with the wound assessment as performed by the wound care RN as outlined below:  Pressure Injury 12/05/20 Coccyx Medial Stage 2 -  Partial thickness loss of dermis presenting as a shallow open injury with a red, pink wound bed without slough. 67m diameter open circular abrasion, top layer skin not intact, red and blanchable, superior (Active)   12/05/20 2145  Location: Coccyx  Location Orientation: Medial  Staging: Stage 2 -  Partial thickness loss of dermis presenting as a shallow open injury with a red, pink wound bed without slough.  Wound Description (Comments): 312mdiameter open circular abrasion, top layer skin not intact, red and blanchable, superior to medial coccyx  Present on Admission: Yes     Pressure Injury 12/22/20 Ankle Anterior;Right Deep Tissue Pressure Injury - Purple or maroon localized area of discolored intact skin or blood-filled blister due to damage of underlying soft tissue from pressure and/or shear. purple ecchymosis 1.8x1.5cm  (Active)  12/22/20 1720  Location: Ankle  Location Orientation: Anterior;Right  Staging: Deep Tissue Pressure Injury - Purple or maroon localized area of discolored intact skin or blood-filled blister due to damage of underlying soft tissue from pressure and/or shear.  Wound Description (Comments): purple ecchymosis 1.8x1.5cm near maleolus  Present on Admission: No     Pressure Injury 12/22/20 Heel Left Deep Tissue Pressure Injury - Purple or maroon localized area of discolored intact skin or blood-filled blister due to damage of underlying soft tissue from pressure and/or shear. 9x6cm dark pink/ red area across heel (Active)  12/22/20 1720  Location: Heel  Location Orientation: Left  Staging: Deep Tissue Pressure Injury - Purple or maroon localized area of discolored intact skin or blood-filled blister due to damage of underlying soft tissue from pressure and/or shear.  Wound Description (Comments): 9x6cm dark pink/ red area across heel  Present on Admission: No     Pressure Injury 12/22/20 Heel Right Deep Tissue Pressure Injury - Purple or maroon localized area of  discolored intact skin or blood-filled blister due to damage of underlying soft tissue from pressure and/or shear. 4x7cm dark purple area across heel (Active)  12/22/20 1720  Location: Heel  Location Orientation:  Right  Staging: Deep Tissue Pressure Injury - Purple or maroon localized area of discolored intact skin or blood-filled blister due to damage of underlying soft tissue from pressure and/or shear.  Wound Description (Comments): 4x7cm dark purple area across heel  Present on Admission: No     Pressure Injury 12/26/20 Foot Anterior;Left (Active)  12/26/20 1953  Location: Foot  Location Orientation: Anterior;Left  Staging:   Wound Description (Comments):   Present on Admission: No     Pressure Injury 02/28/21 Heel Right Stage 3 -  Full thickness tissue loss. Subcutaneous fat may be visible but bone, tendon or muscle are NOT exposed. (Active)  02/28/21 1830  Location: Heel  Location Orientation: Right  Staging: Stage 3 -  Full thickness tissue loss. Subcutaneous fat may be visible but bone, tendon or muscle are NOT exposed.  Wound Description (Comments):   Present on Admission: Yes     Pressure Injury 02/28/21 Thigh Left;Posterior;Proximal light pink round ulcer (Active)  02/28/21 1854  Location: Thigh  Location Orientation: Left;Posterior;Proximal  Staging:   Wound Description (Comments): light pink round ulcer  Present on Admission: Yes    Unresulted Labs (From admission, onward)     Start     Ordered   07/25/21 0500  CBC with Differential/Platelet  Tomorrow morning,   R       Question:  Specimen collection method  Answer:  Lab=Lab collect   07/24/21 1135   07/25/21 [id number]  Basic metabolic panel  Tomorrow morning,   R       Question:  Specimen collection method  Answer:  Lab=Lab collect   07/24/21 1135   07/25/21 0500  Magnesium  Tomorrow morning,   R       Question:  Specimen collection method  Answer:  Lab=Lab collect   07/24/21 1135   07/24/21 0500  Creatinine, serum  (enoxaparin (LOVENOX)    CrCl >/= 30 ml/min)  Weekly,   R (with TIMED occurrences)     Comments: while on enoxaparin therapy    07/17/21 1421   07/18/21 1139  Vitamin B1  ONCE - STAT,   STAT       Comments:  Please obtain prior to administering any IV or PO thiamine   Question:  Release to patient  Answer:  Immediate   07/18/21 1138              DVT prophylaxis: enoxaparin (LOVENOX) injection 40 mg Start: 07/17/21 1430   Code Status: DNR Family Communication: Not at bedside Disposition:   Status is: Inpatient   Dispo: The patient is from: SNF              Anticipated d/c is to: SNF with palliative care              Anticipated d/c date is: Monday or Tuesday pending bed availability                Consultants:  Neurology Palliative care  Procedures:  EEG  Antimicrobials:   Anti-infectives (From admission, onward)    None           Objective: Vitals:   07/24/21 0010 07/24/21 0400 07/24/21 0819 07/24/21 1006  BP: 140/84 130/79 121/60 131/88  Pulse: 67 66 82   Resp: '15 17 16   '$ Temp:  97.8 F (36.6 C) (!) 97.5 F (36.4 C) (!) 97.5 F (36.4 C)   TempSrc: Oral Oral Oral   SpO2: 94% 91% (!) 70%   Weight:        Intake/Output Summary (Last 24 hours) at 07/24/2021 1136 Last data filed at 07/24/2021 1015 Gross per 24 hour  Intake 300 ml  Output 400 ml  Net -100 ml   Filed Weights   07/19/21 0900  Weight: 57 kg    Examination:  General exam: lethargic, open eyes to voice, does not follow command, chronically ill appearing ,does not appear in acute distress, temporal wasting, muscle and fat depletion, chronic contractures Respiratory system: Clear to auscultation. Respiratory effort normal. Cardiovascular system: S1 & S2 heard, RRR. No JVD, no murmur, No pedal edema. Gastrointestinal system: Abdomen is nondistended, soft and nontender.  Normal bowel sounds heard. Central nervous system: lethargic, does not follow commands, chronic contractures Extremities: chronic contractures, no edema, dry skin Skin: multiple pressure ulcers as documented above  Psychiatry: no agitation      Data Reviewed: I have personally reviewed following labs and imaging  studies  CBC: Recent Labs  Lab 07/17/21 1418 07/18/21 0237 07/21/21 0518 07/22/21 0401  WBC 12.5* 8.4 12.1* 11.7*  HGB 10.1* 8.9* 11.7* 11.3*  HCT 31.6* 28.4* 36.0* 34.4*  MCV 95.2 95.9 91.8 93.2  PLT 283 261 294 [id number]    Basic Metabolic Panel: Recent Labs  Lab 07/18/21 0237 07/20/21 0223 07/21/21 0518 07/22/21 0401 07/23/21 0333  NA 135 136 133* 137 137  K 3.4* 3.5 3.3* 4.1 4.1  CL 100 101 96* 103 103  CO2 '27 24 24 26 26  '$ GLUCOSE 67* 113* 91 89 75  BUN 15 8 6* 7* 6*  CREATININE 0.64 0.50* 0.45* 0.52* 0.52*  CALCIUM 8.7* 8.8* 8.9 8.6* 8.6*  MG  --   --  1.1* 1.5* 1.5*  PHOS  --   --   --  2.5  --     GFR: Estimated Creatinine Clearance: 66.3 mL/min (A) (by C-G formula based on SCr of 0.52 mg/dL (L)).  Liver Function Tests: No results for input(s): AST, ALT, ALKPHOS, BILITOT, PROT, ALBUMIN in the last 168 hours.  CBG: Recent Labs  Lab 07/24/21 0049 07/24/21 0127 07/24/21 0621 07/24/21 0653 07/24/21 0745  GLUCAP 54* 128* 58* 67* 135*     Recent Results (from the past 240 hour(s))  Resp Panel by RT-PCR (Flu A&B, Covid) Nasopharyngeal Swab     Status: None   Collection Time: 07/17/21  6:59 AM   Specimen: Nasopharyngeal Swab; Nasopharyngeal(NP) swabs in vial transport medium  Result Value Ref Range Status   SARS Coronavirus 2 by RT PCR NEGATIVE NEGATIVE Final    Comment: (NOTE) SARS-CoV-2 target nucleic acids are NOT DETECTED.  The SARS-CoV-2 RNA is generally detectable in upper respiratory specimens during the acute phase of infection. The lowest concentration of SARS-CoV-2 viral copies this assay can detect is 138 copies/mL. A negative result does not preclude SARS-Cov-2 infection and should not be used as the sole basis for treatment or other patient management decisions. A negative result may occur with  improper specimen collection/handling, submission of specimen other than nasopharyngeal swab, presence of viral mutation(s) within the areas  targeted by this assay, and inadequate number of viral copies(<138 copies/mL). A negative result must be combined with clinical observations, patient history, and epidemiological information. The expected result is Negative.  Fact Sheet for Patients:  EntrepreneurPulse.com.au  Fact Sheet for Healthcare Providers:  IncredibleEmployment.be  This test is no t yet approved or cleared by the [place] and  has been authorized for detection and/or diagnosis of SARS-CoV-2 by FDA under an Emergency Use Authorization (EUA). This EUA will remain  in effect (meaning this test can be used) for the duration of the COVID-19 declaration under Section 564(b)(1) of the Act, 21 U.S.C.section 360bbb-3(b)(1), unless the authorization is terminated  or revoked sooner.       Influenza A by PCR NEGATIVE NEGATIVE Final   Influenza B by PCR NEGATIVE NEGATIVE Final    Comment: (NOTE) The Xpert Xpress SARS-CoV-2/FLU/RSV plus assay is intended as an aid in the diagnosis of influenza from Nasopharyngeal swab specimens and should not be used as a sole basis for treatment. Nasal washings and aspirates are unacceptable for Xpert Xpress SARS-CoV-2/FLU/RSV testing.  Fact Sheet for Patients: EntrepreneurPulse.com.au  Fact Sheet for Healthcare Providers: IncredibleEmployment.be  This test is not yet approved or cleared by the [place] FDA and has been authorized for detection and/or diagnosis of SARS-CoV-2 by FDA under an Emergency Use Authorization (EUA). This EUA will remain in effect (meaning this test can be used) for the duration of the COVID-19 declaration under Section 564(b)(1) of the Act, 21 U.S.C. section 360bbb-3(b)(1), unless the authorization is terminated or revoked.  Performed at [facility], [place] [address] [place], [place] [address]          Radiology Studies: No results  found.      Scheduled Meds:  acetaminophen  500 mg Oral TID   amLODipine  2.5 mg Oral Daily   atorvastatin  20 mg Oral Daily   divalproex  500 mg Oral Q8H   enoxaparin (LOVENOX) injection  40 mg Subcutaneous Q24H   FLUoxetine  40 mg Oral Daily   levETIRAcetam  500 mg Oral BID   magnesium oxide  400 mg Oral BID   mirtazapine  30 mg Oral QHS   mupirocin ointment   Topical BID   nystatin  5 mL Oral QID   thiamine injection  100 mg Intravenous Daily   Or   thiamine  100 mg Oral Daily   Continuous Infusions:   LOS: 7 days   Time spent: 29mns Greater than 50% of this time was spent in counseling, explanation of diagnosis, planning of further management, and coordination of care.   Voice Recognition /Viviann Sparedictation system was used to create this note, attempts have been made to correct errors. Please contact the author with questions and/or clarifications.   FFlorencia Reasons MD PhD FACP [facility]  Available via Epic secure chat 7am-7pm for nonurgent issues Please page for urgent issues To page the attending provider between 7A-7P or the covering provider during after hours 7P-7A, please log into the web site [email or web address] and access using universal [place] password for that web site. If you do not have the password, please call the hospital operator.    07/24/2021, 11:36 AM

## 2021-07-24 NOTE — TOC Progression Note (Signed)
Transition of Care [facility]) - Progression Note    Patient Details  Name: Brad [name] MRN: [id number] Date of Birth: 03-Apr-1946  Transition of Care [facility]) CM/SW [place], [place] Phone Number: 07/24/2021, 10:25 AM  Clinical Narrative:     CSW was notified that pt is progressing back to baseline and will not be pursuing comfort care. Pt is from [place] and sister is adamant he not return. CSW attempted to call sister to discuss barriers to new placement, a voicemail was left. SW will continue to follow for DC needs.  Expected Discharge Plan: [place] Barriers to Discharge: Continued Medical Work up  Expected Discharge Plan and Services Expected Discharge Plan: [place] In-house Referral: Clinical Social Work Discharge Planning Services: CM Consult Post Acute Care Choice: [place] arrangements for the past 2 months: [place]                                       Social Determinants of Health (SDOH) Interventions    Readmission Risk Interventions No flowsheet data found.

## 2021-07-25 DIAGNOSIS — I10 Essential (primary) hypertension: Secondary | ICD-10-CM | POA: Diagnosis not present

## 2021-07-25 DIAGNOSIS — Z515 Encounter for palliative care: Secondary | ICD-10-CM | POA: Diagnosis not present

## 2021-07-25 DIAGNOSIS — Z7189 Other specified counseling: Secondary | ICD-10-CM | POA: Diagnosis not present

## 2021-07-25 LAB — CBC WITH DIFFERENTIAL/PLATELET
Abs Immature Granulocytes: 0.04 10*3/uL (ref 0.00–0.07)
Basophils Absolute: 0 10*3/uL (ref 0.0–0.1)
Basophils Relative: 0 %
Eosinophils Absolute: 0.2 10*3/uL (ref 0.0–0.5)
Eosinophils Relative: 2 %
HCT: 33.9 % — ABNORMAL LOW (ref 39.0–52.0)
Hemoglobin: 11 g/dL — ABNORMAL LOW (ref 13.0–17.0)
Immature Granulocytes: 1 %
Lymphocytes Relative: 29 %
Lymphs Abs: 2.2 10*3/uL (ref 0.7–4.0)
MCH: 31 pg (ref 26.0–34.0)
MCHC: 32.4 g/dL (ref 30.0–36.0)
MCV: 95.5 fL (ref 80.0–100.0)
Monocytes Absolute: 0.7 10*3/uL (ref 0.1–1.0)
Monocytes Relative: 9 %
Neutro Abs: 4.5 10*3/uL (ref 1.7–7.7)
Neutrophils Relative %: 59 %
Platelets: 266 10*3/uL (ref 150–400)
RBC: 3.55 MIL/uL — ABNORMAL LOW (ref 4.22–5.81)
RDW: 12.4 % (ref 11.5–15.5)
WBC: 7.6 10*3/uL (ref 4.0–10.5)
nRBC: 0 % (ref 0.0–0.2)

## 2021-07-25 LAB — BASIC METABOLIC PANEL
Anion gap: 7 (ref 5–15)
BUN: 7 mg/dL — ABNORMAL LOW (ref 8–23)
CO2: 30 mmol/L (ref 22–32)
Calcium: 8.6 mg/dL — ABNORMAL LOW (ref 8.9–10.3)
Chloride: 101 mmol/L (ref 98–111)
Creatinine, Ser: 0.5 mg/dL — ABNORMAL LOW (ref 0.61–1.24)
GFR, Estimated: >60 mL/min (ref >60)
Glucose, Bld: 83 mg/dL (ref 70–99)
Potassium: 4.1 mmol/L (ref 3.5–5.1)
Sodium: 138 mmol/L (ref 135–145)

## 2021-07-25 LAB — GLUCOSE, CAPILLARY
Glucose-Capillary: 100 mg/dL — ABNORMAL HIGH (ref 70–99)
Glucose-Capillary: 113 mg/dL — ABNORMAL HIGH (ref 70–99)
Glucose-Capillary: 53 mg/dL — ABNORMAL LOW (ref 70–99)
Glucose-Capillary: 73 mg/dL (ref 70–99)
Glucose-Capillary: 74 mg/dL (ref 70–99)

## 2021-07-25 LAB — MAGNESIUM: Magnesium: 1.5 mg/dL — ABNORMAL LOW (ref 1.7–2.4)

## 2021-07-25 MED ORDER — MAGNESIUM SULFATE 2 GM/50ML IV SOLN
2.0000 g | Freq: Once | INTRAVENOUS | Status: AC
  Administered 2021-07-25: 2 g via INTRAVENOUS
  Filled 2021-07-25: qty 50

## 2021-07-25 MED ORDER — DEXTROSE 5 % IV SOLN: INTRAVENOUS | Status: DC

## 2021-07-25 MED ORDER — LEVETIRACETAM 100 MG/ML PO SOLN
500.0000 mg | Freq: Two times a day (BID) | ORAL | Status: DC
  Administered 2021-07-25 – 2021-07-28 (×6): 500 mg via ORAL
  Filled 2021-07-25 (×6): qty 5

## 2021-07-25 NOTE — Progress Notes (Addendum)
PROGRESS NOTE    Brad [name]  [id number] DOB: 08-26-1946 DOA: 07/17/2021 PCP: [name], DO   Chief Complain:  Brief Narrative: Brad [name] is an 75 y.o. male with history of dementia, hypertension, diabetes mellitus type 2, prostate CA, seizure disorder, history of substance abuse from skilled nursing facility, bedbound with multiple contractures was sent to the hospital with concerns for seizures. During this hospitalization,patient was noted to have ongoing seizures and neurology followed the patient.  Antiepileptic medications were adjusted.  Palliative care was consulted to discuss about goals of care.Waiting on improvement in mental status.  We will wait for palliative care conversation with family before discharge planning .  Assessment & Plan:   Principal Problem:   Seizures ([place]) Active Problems:   Hypertension   Diabetes ([place])   Dementia (HCC)   Bedbound   Metabolic encephalopathy: Secondary to seizures, postictal state.  Patient also has underlying dementia.  Neurology was consulted and following here.  Currently seizure-free.  Remains lethargic with poor oral intake. Oriented to place Speech therapy planning for MBS,currently on dysphagia 1 diet  Seizure disorder: Neurology was following.  Valproic acid was subtherapeutic on presentation.  Was on continuous EEG here.  Currently on Keppra, Depakote.  We recommend to follow-up with neurology as an outpatient in 10 to 12 weeks.  Oral thrush: Continue nystatin  Hypertension: Blood pressure soft but stable.  Antihypertensives on hold.  Started on gentle hydration.  Failure to thrive: Severely malnourished.  Failure to thrive.  Palliative care following.  Anxiety/depression: Mirtazapine, fluoxetine  Chronic pain syndrome: Was on tramadol at home.  Has been discontinued in the setting of seizures.  Hypomagnesemia: Continue to monitor and supplement.  High TSH: Normal T4.  Check TFT in few weeks.  Goals  of care: Multiple comorbidities, underlying dementia.  Has poor oral intake, confusion.  Palliative care has been involved.  He might be candidate for hospice due to his poor oral intake.  Family still not ready, anticipating improvement in the mental status.  Pressure Injury 12/05/20 Coccyx Medial Stage 2 -  Partial thickness loss of dermis presenting as a shallow open injury with a red, pink wound bed without slough. 15m diameter open circular abrasion, top layer skin not intact, red and blanchable, superior (Active)  12/05/20 2145  Location: Coccyx  Location Orientation: Medial  Staging: Stage 2 -  Partial thickness loss of dermis presenting as a shallow open injury with a red, pink wound bed without slough.  Wound Description (Comments): 343mdiameter open circular abrasion, top layer skin not intact, red and blanchable, superior to medial coccyx  Present on Admission: Yes     Pressure Injury 12/22/20 Ankle Anterior;Right Deep Tissue Pressure Injury - Purple or maroon localized area of discolored intact skin or blood-filled blister due to damage of underlying soft tissue from pressure and/or shear. purple ecchymosis 1.8x1.5cm  (Active)  12/22/20 1720  Location: Ankle  Location Orientation: Anterior;Right  Staging: Deep Tissue Pressure Injury - Purple or maroon localized area of discolored intact skin or blood-filled blister due to damage of underlying soft tissue from pressure and/or shear.  Wound Description (Comments): purple ecchymosis 1.8x1.5cm near maleolus  Present on Admission: No     Pressure Injury 12/22/20 Heel Left Deep Tissue Pressure Injury - Purple or maroon localized area of discolored intact skin or blood-filled blister due to damage of underlying soft tissue from pressure and/or shear. 9x6cm dark pink/ red area across heel (Active)  12/22/20 1720  Location: Heel  Location Orientation: Left  Staging: Deep Tissue Pressure Injury - Purple or maroon localized area of discolored  intact skin or blood-filled blister due to damage of underlying soft tissue from pressure and/or shear.  Wound Description (Comments): 9x6cm dark pink/ red area across heel  Present on Admission: No     Pressure Injury 12/22/20 Heel Right Deep Tissue Pressure Injury - Purple or maroon localized area of discolored intact skin or blood-filled blister due to damage of underlying soft tissue from pressure and/or shear. 4x7cm dark purple area across heel (Active)  12/22/20 1720  Location: Heel  Location Orientation: Right  Staging: Deep Tissue Pressure Injury - Purple or maroon localized area of discolored intact skin or blood-filled blister due to damage of underlying soft tissue from pressure and/or shear.  Wound Description (Comments): 4x7cm dark purple area across heel  Present on Admission: No     Pressure Injury 12/26/20 Foot Anterior;Left (Active)  12/26/20 1953  Location: Foot  Location Orientation: Anterior;Left  Staging:   Wound Description (Comments):   Present on Admission: No     Pressure Injury 02/28/21 Heel Right Stage 3 -  Full thickness tissue loss. Subcutaneous fat may be visible but bone, tendon or muscle are NOT exposed. (Active)  02/28/21 1830  Location: Heel  Location Orientation: Right  Staging: Stage 3 -  Full thickness tissue loss. Subcutaneous fat may be visible but bone, tendon or muscle are NOT exposed.  Wound Description (Comments):   Present on Admission: Yes     Pressure Injury 02/28/21 Thigh Left;Posterior;Proximal light pink round ulcer (Active)  02/28/21 1854  Location: Thigh  Location Orientation: Left;Posterior;Proximal  Staging:   Wound Description (Comments): light pink round ulcer  Present on Admission: Yes                 DVT prophylaxis:Lovenox Code Status: DNR Family Communication: Talked to sister on phone on 07/25/20 Status is: Inpatient  Remains inpatient appropriate because:Unsafe d/c plan  Dispo: The patient is from: Home               Anticipated d/c is to: SNF               Patient currently is not medically stable to d/c.   Difficult to place patient No     Consultants: Neurology,palliative care  Procedures:EEG  Antimicrobials:  Anti-infectives (From admission, onward)    None       Subjective: Patient seen and examined the bedside this morning.  Hemodynamically stable.  Was alert and awake and was oriented to place.  Mental status might have improved.  Coherent answer to my questions but looks very deconditioned, debilitated   Objective: Vitals:   07/24/21 1959 07/25/21 0034 07/25/21 0424 07/25/21 0754  BP: 138/68 135/67 127/88 (!) 143/75  Pulse: 76 68 (!) 58 68  Resp: '20 18 17 20  '$ Temp: (!) 97.4 F (36.3 C) 98 F (36.7 C) (!) 97.4 F (36.3 C) 97.8 F (36.6 C)  TempSrc: Oral  Oral Oral  SpO2: 94%  (!) 83%   Weight:        Intake/Output Summary (Last 24 hours) at 07/25/2021 1015 Last data filed at 07/25/2021 0424 Gross per 24 hour  Intake 300 ml  Output 1400 ml  Net -1100 ml   Filed Weights   07/19/21 0900  Weight: 57 kg    Examination:  General exam: Very deconditioned, chronically ill looking, bedbound, not in distress HEENT: PERRL Respiratory system:  no wheezes or crackles  Cardiovascular system: S1 & S2 heard, RRR.  Gastrointestinal system: Abdomen is nondistended, soft and nontender. Central nervous system: Alert and awake, oriented to place only Extremities: Strictures/contractures of bilateral lower extremities Skin: Pressure ulcers as above.    Data Reviewed: I have personally reviewed following labs and imaging studies  CBC: Recent Labs  Lab 07/21/21 0518 07/22/21 0401 07/25/21 0706  WBC 12.1* 11.7* 7.6  NEUTROABS  --   --  4.5  HGB 11.7* 11.3* 11.0*  HCT 36.0* 34.4* 33.9*  MCV 91.8 93.2 95.5  PLT 294 264 [id number]   Basic Metabolic Panel: Recent Labs  Lab 07/20/21 0223 07/21/21 0518 07/22/21 0401 07/23/21 0333 07/25/21 0706  NA 136 133* 137 137  138  K 3.5 3.3* 4.1 4.1 4.1  CL 101 96* 103 103 101  CO2 '24 24 26 26 30  '$ GLUCOSE 113* 91 89 75 83  BUN 8 6* 7* 6* 7*  CREATININE 0.50* 0.45* 0.52* 0.52* 0.50*  CALCIUM 8.8* 8.9 8.6* 8.6* 8.6*  MG  --  1.1* 1.5* 1.5* 1.5*  PHOS  --   --  2.5  --   --    GFR: Estimated Creatinine Clearance: 66.3 mL/min (A) (by C-G formula based on SCr of 0.5 mg/dL (L)). Liver Function Tests: No results for input(s): AST, ALT, ALKPHOS, BILITOT, PROT, ALBUMIN in the last 168 hours. No results for input(s): LIPASE, AMYLASE in the last 168 hours. Recent Labs  Lab 07/20/21 0813  AMMONIA 36*   Coagulation Profile: No results for input(s): INR, PROTIME in the last 168 hours. Cardiac Enzymes: No results for input(s): CKTOTAL, CKMB, CKMBINDEX, TROPONINI in the last 168 hours. BNP (last 3 results) No results for input(s): PROBNP in the last 8760 hours. HbA1C: No results for input(s): HGBA1C in the last 72 hours. CBG: Recent Labs  Lab 07/24/21 1913 07/24/21 1930 07/25/21 0013 07/25/21 0121 07/25/21 0614  GLUCAP 67* 54* 53* 113* 73   Lipid Profile: No results for input(s): CHOL, HDL, LDLCALC, TRIG, CHOLHDL, LDLDIRECT in the last 72 hours. Thyroid Function Tests: No results for input(s): TSH, T4TOTAL, FREET4, T3FREE, THYROIDAB in the last 72 hours. Anemia Panel: No results for input(s): VITAMINB12, FOLATE, FERRITIN, TIBC, IRON, RETICCTPCT in the last 72 hours. Sepsis Labs: No results for input(s): PROCALCITON, LATICACIDVEN in the last 168 hours.  Recent Results (from the past 240 hour(s))  Resp Panel by RT-PCR (Flu A&B, Covid) Nasopharyngeal Swab     Status: None   Collection Time: 07/17/21  6:59 AM   Specimen: Nasopharyngeal Swab; Nasopharyngeal(NP) swabs in vial transport medium  Result Value Ref Range Status   SARS Coronavirus 2 by RT PCR NEGATIVE NEGATIVE Final    Comment: (NOTE) SARS-CoV-2 target nucleic acids are NOT DETECTED.  The SARS-CoV-2 RNA is generally detectable in upper  respiratory specimens during the acute phase of infection. The lowest concentration of SARS-CoV-2 viral copies this assay can detect is 138 copies/mL. A negative result does not preclude SARS-Cov-2 infection and should not be used as the sole basis for treatment or other patient management decisions. A negative result may occur with  improper specimen collection/handling, submission of specimen other than nasopharyngeal swab, presence of viral mutation(s) within the areas targeted by this assay, and inadequate number of viral copies(<138 copies/mL). A negative result must be combined with clinical observations, patient history, and epidemiological information. The expected result is Negative.  Fact Sheet for Patients:  EntrepreneurPulse.com.au  Fact Sheet for Healthcare Providers:  IncredibleEmployment.be  This test is no t  yet approved or cleared by the [place] and  has been authorized for detection and/or diagnosis of SARS-CoV-2 by FDA under an Emergency Use Authorization (EUA). This EUA will remain  in effect (meaning this test can be used) for the duration of the COVID-19 declaration under Section 564(b)(1) of the Act, 21 U.S.C.section 360bbb-3(b)(1), unless the authorization is terminated  or revoked sooner.       Influenza A by PCR NEGATIVE NEGATIVE Final   Influenza B by PCR NEGATIVE NEGATIVE Final    Comment: (NOTE) The Xpert Xpress SARS-CoV-2/FLU/RSV plus assay is intended as an aid in the diagnosis of influenza from Nasopharyngeal swab specimens and should not be used as a sole basis for treatment. Nasal washings and aspirates are unacceptable for Xpert Xpress SARS-CoV-2/FLU/RSV testing.  Fact Sheet for Patients: EntrepreneurPulse.com.au  Fact Sheet for Healthcare Providers: IncredibleEmployment.be  This test is not yet approved or cleared by the [place] FDA and has been  authorized for detection and/or diagnosis of SARS-CoV-2 by FDA under an Emergency Use Authorization (EUA). This EUA will remain in effect (meaning this test can be used) for the duration of the COVID-19 declaration under Section 564(b)(1) of the Act, 21 U.S.C. section 360bbb-3(b)(1), unless the authorization is terminated or revoked.  Performed at [facility], [place] [address] [place], [place] [address]          Radiology Studies: No results found.      Scheduled Meds:  acetaminophen  500 mg Oral TID   atorvastatin  20 mg Oral Daily   divalproex  500 mg Oral Q8H   enoxaparin (LOVENOX) injection  40 mg Subcutaneous Q24H   FLUoxetine  40 mg Oral Daily   levETIRAcetam  500 mg Oral BID   magnesium oxide  400 mg Oral BID   mirtazapine  30 mg Oral QHS   mupirocin ointment   Topical BID   nystatin  5 mL Oral QID   thiamine injection  100 mg Intravenous Daily   Or   thiamine  100 mg Oral Daily   Continuous Infusions:  dextrose     magnesium sulfate bolus IVPB 2 g (07/25/21 0927)     LOS: 8 days    Time spent: 35 mins.More than 50% of that time was spent in counseling and/or coordination of care.      Shelly Coss, MD [facility] P8/07/2021, 10:15 AM

## 2021-07-25 NOTE — Progress Notes (Addendum)
Palliative Medicine Inpatient Follow Up Note  Consulting Provider: Joseph Art, DO   Reason for consult:   Palliative Care Consult Services Palliative Medicine Consult  Reason for Consult? goc    HPI:  Per intake H&P --> Brad [name] is an 75 y.o. male with history of dementia, HTN, DM2, prostate CA, seizure disorder, substance abuse who lives in a nursing home and is bedbound with multiple contractures who was sent in from SNF for seizures. Palliative care was requested to get involved in the setting of end stage dementia to have additional goals of care conversations.  Today's Discussion (07/25/2021):  *Please note that this is a verbal dictation therefore any spelling or grammatical errors are due to the "Dragon Medical One" system interpretation.  Chart reviewed.   I met this morning with Chad Cordial, Zollie Scale.She expresses that he did not pass the swallow evaluation. Asks if the patients sister would want [place] to proceed with an MBSS.  I reached out to [facility] this morning. We reviewed that presently it appears [place] is exemplifying signs of aspiration per the bedside evaluation. I asked [name] if we should proceed with an MBSS to gain clarity on the severity of this. She was in favor of [place] going forward with this. She requests to meet with the PMT today at 3PM to further discuss his situation.   I messaged the team regarding the above.   Objective Assessment: Vital Signs Vitals:   07/25/21 0424 07/25/21 0754  BP: 127/88 (!) 143/75  Pulse: (!) 58 68  Resp: 17 20  Temp: (!) 97.4 F (36.3 C) 97.8 F (36.6 C)  SpO2: (!) 83%     Intake/Output Summary (Last 24 hours) at 07/25/2021 1041 Last data filed at 07/25/2021 0800 Gross per 24 hour  Intake 540 ml  Output 1400 ml  Net -860 ml    Last Weight  Most recent update: 07/19/2021  9:10 AM    Weight  57 kg (125 lb 10.6 oz)            Gen:  Elderly M in mild distress HEENT: (+) Oral thrush CV: Regular rate and  rhythm PULM: On RA ABD: soft/nontender EXT: (+) pedal edema Neuro: Somnolent, non-verbal today  SUMMARY OF RECOMMENDATIONS   DNAR/DNI  Patients sister wishes to give the patient more time to see if he may become more coherent   TOC team aware sister does not want to go back to [facility] --> Have asked MSW to reach out  Ongoing incremental PMT support - Sister does not wish to feel "pushed" into decisions  Symptoms: oral thrush: Improving, Continue on nystatin   Generalized arthritic pain: Tylenol  ATC; Fentanyl 12.32mcg Q3H PRN   Time Spent: 25 Greater than 50% of the time was spent in counseling and coordination of care ______________________________________________________________________________________ Addendum: I met with patients sister, [name] at bedside. We reviewed Roberts swallow evaluation and his risk for aspiration. I shared that per review of speech therapy notes he has been a known moderate aspiration risk and he has been fed anyway. We discussed the plan for an MBSS tomorrow for further clarity. We reviewed that a g-tube in [place] case is not advisable given the degree of his dementia. Veartnette is clear about not wishing to place an NGT. She wants the results of the swallow evaluation for informational purposed.  I stayed at bedside while feeding was offered.I educated [name] on how to minimize aspiration risk during feedings.  We discussed that [name] had  spoke to the Education officer, museum and plans to review other placement options. She remains to hold [facility] responsible for [place] failing health. I tried again to explain his disease severity is the cause of his declining state.   Time in 1500 Time Out 1540 Time Spent: 40 Greater than 50% of the time was spent in counseling and coordination of care  [place] Team Team Cell Phone: [phone] Please utilize secure chat with additional questions, if there is no  response within 30 minutes please call the above phone number  Palliative Medicine Team providers are available by phone from 7am to 7pm daily and can be reached through the team cell phone.  Should this patient require assistance outside of these hours, please call the patient's attending physician.

## 2021-07-25 NOTE — Progress Notes (Signed)
  Speech Language Pathology Treatment: Dysphagia  Patient Details Name: NICOLA FEHLMAN MRN: [id number] DOB: [birth year] Today's Date: 07/25/2021 Time: [id number] SLP Time Calculation (min) (ACUTE ONLY): 20 min  Assessment / Plan / Recommendation Clinical Impression  Mr. Lorek was seen with recently upgraded PO of purees and thin liquids. RN reports he did well with breakfast tray, but does endorse some mild throat clearing and eructations. He has frequent eructations, noted today especially after liquid intake. He has known history of esophageal deficits and is in end-stage dementia. Per discussion with palliative NP, family wants to have all the information possible prior to making decisions regarding aggressive measures/feeding tubes, etc. He demonstrated multiple swallows, audible swallows, wet vocal quality, eructations, and intermittent weak cough after thin liquid intake. He has multiple high risk factors for aspiration/aspiration pneumonia including dementia, recent neurological events(seizures), bed-bound/non-ambulatory, and inability to complete his own oral care.  Although he has advanced dementia, he was able to state he would be amenable to thickened liquids if indicated.   Based on high risk factors, s/s aspiration, and discussion with family and palliative NP, plan to proceed with MBSS. Study to be completed next date based on radiology availability.   HPI HPI: Patient is a 75 y.o. male with PMH: dementia, HTN, DM2, prostate CA, seizure disorder, substance abuse who lives in a nursing home and is bedbound with multiple contractures who was sent in from a [facility] for seizures.  EMS was called and he was noted to have right gaze preference with facial twitching.  He was treated with Versed with some improvement and then seizures recurred again. He has h/o dysphagia and has been seen by ST services during previous admissions. MBS in 2020 revealed mild pharyngeal phase  dysphagia but esophagram that same day revealed moderate esophageal dysmotility with no evidence of esophageal stricture or hiatal hernia.      SLP Plan  Continue with current plan of care;MBS       Recommendations  Diet recommendations: Dysphagia 1 (puree);Thin liquid Liquids provided via: Cup;Straw Medication Administration: Crushed with puree Supervision: Full supervision/cueing for compensatory strategies;Staff to assist with self feeding Compensations: Minimize environmental distractions;Slow rate;Small sips/bites;Other (Comment) (allow pt to perform spontaneous repeat dry swallow after bites/sips) Postural Changes and/or Swallow Maneuvers: Seated upright 90 degrees;Upright 30-60 min after meal                Oral Care Recommendations: Oral care BID;Staff/trained caregiver to provide oral care Follow up Recommendations: Skilled Nursing facility SLP Visit Diagnosis: Dysphagia, unspecified (R13.10) Plan: Continue with current plan of care;MBS                   Abrish Erny P. Qusai Kem, M.S., CCC-SLP Speech-Language Pathologist Acute Rehabilitation Services Pager: [place] 07/25/2021, 10:37 AM

## 2021-07-25 NOTE — TOC Progression Note (Signed)
Transition of Care [facility]) - Progression Note    Patient Details  Name: Brad [name] MRN: [id number] Date of Birth: 1946-05-15  [facility]) CM/SW [place], [place] Phone Number: 07/25/2021, 11:24 AM  Clinical Narrative:   CSW spoke with patient's sister about requesting a different SNF for patient at discharge. CSW explained difficulty in finding a Medicaid bed for patient, and provided only other bed offer at [facility]. CSW discussed possibility of looking outside of [place], and sister in agreement with looking outside of town. CSW faxed patient out further, and sister will look into [place]. CSW to continue to follow.    Expected Discharge Plan: [place] Barriers to Discharge: Continued Medical Work up  Expected Discharge Plan and Services Expected Discharge Plan: [place] In-house Referral: Clinical Social Work Discharge Planning Services: CM Consult Post Acute Care Choice: [place] arrangements for the past 2 months: [place]                                       Social Determinants of Health (SDOH) Interventions    Readmission Risk Interventions No flowsheet data found.

## 2021-07-26 ENCOUNTER — Inpatient Hospital Stay (HOSPITAL_COMMUNITY): Payer: Medicare Other

## 2021-07-26 LAB — GLUCOSE, CAPILLARY
Glucose-Capillary: 77 mg/dL (ref 70–99)
Glucose-Capillary: 99 mg/dL (ref 70–99)

## 2021-07-26 MED ORDER — DEXTROSE-NACL 5-0.9 % IV SOLN: INTRAVENOUS | Status: DC

## 2021-07-26 MED ORDER — ENSURE ENLIVE PO LIQD
237.0000 mL | Freq: Two times a day (BID) | ORAL | Status: DC
  Administered 2021-07-26 – 2021-07-28 (×5): 237 mL via ORAL

## 2021-07-26 MED ORDER — TRAMADOL HCL 50 MG PO TABS: 50.0000 mg | ORAL_TABLET | Freq: Four times a day (QID) | ORAL | 0 refills | Status: DC | PRN

## 2021-07-26 NOTE — Plan of Care (Signed)

## 2021-07-26 NOTE — Progress Notes (Signed)
Presents with PROGRESS NOTE    Brad [name]  [id number] DOB: October 02, 1946 DOA: 07/17/2021 PCP: [name], DO   Chief Complain:  Brief Narrative: Brad [name] is an 75 y.o. male with history of dementia, hypertension, diabetes mellitus type 2, prostate CA, seizure disorder, history of substance abuse from skilled nursing facility, bedbound with multiple contractures was sent to the hospital with concerns for seizures. During this hospitalization,patient was noted to have ongoing seizures and neurology followed the patient.  Antiepileptic medications were adjusted.  Palliative care was consulted to discuss about goals of care.  Brad [name] was quite encephalopathic on presentation, mental status is currently at baseline. Palliative care was also following during this admission.  Plan is to discharge him back to skilled nursing facility.  TOC following.  Medically stable for discharge whenever possible.  Assessment & Plan:   Principal Problem:   Seizures ([place]) Active Problems:   Hypertension   Diabetes ([place])   Dementia ([facility])   Bedbound   Metabolic encephalopathy: Secondary to seizures, postictal state.  Patient also has underlying dementia.  Neurology was consulted and following here.  Currently seizure-free.  Mental status is improving and now probably at baseline.    Dysphagia/poor oral intake: Underwent MBS, recommended dysphagia 1 diet.  Brad [name] has poor oral intake.  Also ordered ensure  Seizure disorder: Neurology was following.  Valproic acid was subtherapeutic on presentation.  Was on continuous EEG here.  Currently on Keppra, Depakote.  We recommend to follow-up with neurology as an outpatient in 10 to 12 weeks.  Oral thrush: Continue nystatin  Hypertension: Blood pressure  stable.  Antihypertensives on hold.    Failure to thrive: Severely malnourished.  Failure to thrive.  Palliative care following.  Anxiety/depression: Mirtazapine, fluoxetine  Chronic pain syndrome: Was on  tramadol at home.  Has been discontinued in the setting of seizures.  Hypomagnesemia: Continue to monitor and supplement.  High TSH: Normal T4.  Check TFT in few weeks.  Goals of care: Multiple comorbidities, underlying dementia.  Has poor oral intake very poor quality of life due to bilateral lower extremity strictures/atrophy, patient is nonambulatory.  Lives in a skilled nursing facility.  Palliative care has been involved.  Brad [name] might be candidate for hospice due to his poor oral intake.  Family still not ready for that. Pressure Injury 12/05/20 Coccyx Medial Stage 2 -  Partial thickness loss of dermis presenting as a shallow open injury with a red, pink wound bed without slough. 22m diameter open circular abrasion, top layer skin not intact, red and blanchable, superior (Active)  12/05/20 2145  Location: Coccyx  Location Orientation: Medial  Staging: Stage 2 -  Partial thickness loss of dermis presenting as a shallow open injury with a red, pink wound bed without slough.  Wound Description (Comments): 369mdiameter open circular abrasion, top layer skin not intact, red and blanchable, superior to medial coccyx  Present on Admission: Yes     Pressure Injury 12/22/20 Ankle Anterior;Right Deep Tissue Pressure Injury - Purple or maroon localized area of discolored intact skin or blood-filled blister due to damage of underlying soft tissue from pressure and/or shear. purple ecchymosis 1.8x1.5cm  (Active)  12/22/20 1720  Location: Ankle  Location Orientation: Anterior;Right  Staging: Deep Tissue Pressure Injury - Purple or maroon localized area of discolored intact skin or blood-filled blister due to damage of underlying soft tissue from pressure and/or shear.  Wound Description (Comments): purple ecchymosis 1.8x1.5cm near maleolus  Present on Admission: No  Pressure Injury 12/22/20 Heel Left Deep Tissue Pressure Injury - Purple or maroon localized area of discolored intact skin or blood-filled  blister due to damage of underlying soft tissue from pressure and/or shear. 9x6cm dark pink/ red area across heel (Active)  12/22/20 1720  Location: Heel  Location Orientation: Left  Staging: Deep Tissue Pressure Injury - Purple or maroon localized area of discolored intact skin or blood-filled blister due to damage of underlying soft tissue from pressure and/or shear.  Wound Description (Comments): 9x6cm dark pink/ red area across heel  Present on Admission: No     Pressure Injury 12/22/20 Heel Right Deep Tissue Pressure Injury - Purple or maroon localized area of discolored intact skin or blood-filled blister due to damage of underlying soft tissue from pressure and/or shear. 4x7cm dark purple area across heel (Active)  12/22/20 1720  Location: Heel  Location Orientation: Right  Staging: Deep Tissue Pressure Injury - Purple or maroon localized area of discolored intact skin or blood-filled blister due to damage of underlying soft tissue from pressure and/or shear.  Wound Description (Comments): 4x7cm dark purple area across heel  Present on Admission: No     Pressure Injury 12/26/20 Foot Anterior;Left (Active)  12/26/20 1953  Location: Foot  Location Orientation: Anterior;Left  Staging:   Wound Description (Comments):   Present on Admission: No     Pressure Injury 02/28/21 Heel Right Stage 3 -  Full thickness tissue loss. Subcutaneous fat may be visible but bone, tendon or muscle are NOT exposed. (Active)  02/28/21 1830  Location: Heel  Location Orientation: Right  Staging: Stage 3 -  Full thickness tissue loss. Subcutaneous fat may be visible but bone, tendon or muscle are NOT exposed.  Wound Description (Comments):   Present on Admission: Yes     Pressure Injury 02/28/21 Thigh Left;Posterior;Proximal light pink round ulcer (Active)  02/28/21 1854  Location: Thigh  Location Orientation: Left;Posterior;Proximal  Staging:   Wound Description (Comments): light pink round ulcer   Present on Admission: Yes              DVT prophylaxis:Lovenox Code Status: DNR Family Communication: Talked to sister on phone on 07/25/20 Status is: Inpatient  Remains inpatient appropriate because:Unsafe d/c plan  Dispo: The patient is from: Home              Anticipated d/c is to: SNF               Patient currently is medically stable for dc   Difficult to place patient No     Consultants: Neurology,palliative care  Procedures:EEG  Antimicrobials:  Anti-infectives (From admission, onward)    None       Subjective: Patient seen and examined at the bedside this morning.  Hemodynamically stable.  Brad [name] was being fed.  Brad [name] just returned from [facility].  Brad [name] looks overall comfortable.  Alert, awake and participates his conversation.  Oriented to place only.  Denies any complaints   Objective: Vitals:   07/25/21 1610 07/25/21 1957 07/25/21 2307 07/26/21 0343  BP: 130/78 135/65 135/71 (!) 143/85  Pulse: 71 70 80 75  Resp: '20 17 17 18  '$ Temp: 98.6 F (37 C) 99 F (37.2 C) 98.9 F (37.2 C) 98 F (36.7 C)  TempSrc: Oral Oral Oral Oral  SpO2:  99% 96% 92%  Weight:        Intake/Output Summary (Last 24 hours) at 07/26/2021 0749 Last data filed at 07/26/2021 0555 Gross per 24 hour  Intake 1580.39 ml  Output 1500 ml  Net 80.39 ml   Filed Weights   07/19/21 0900  Weight: 57 kg    Examination:  General exam: Very deconditioned, chronically ill looking, bedbound HEENT: PERRL Respiratory system:  no wheezes or crackles  Cardiovascular system: S1 & S2 heard, RRR.  Gastrointestinal system: Abdomen is nondistended, soft and nontender. Central nervous system: Alert and awake, oriented to place only  extremities: Strictures/contractures of bilateral lower extremities  skin: Pressure ulcers as above  Data Reviewed: I have personally reviewed following labs and imaging studies  CBC: Recent Labs  Lab 07/21/21 0518 07/22/21 0401 07/25/21 0706  WBC 12.1* 11.7* 7.6   NEUTROABS  --   --  4.5  HGB 11.7* 11.3* 11.0*  HCT 36.0* 34.4* 33.9*  MCV 91.8 93.2 95.5  PLT 294 264 [id number]   Basic Metabolic Panel: Recent Labs  Lab 07/20/21 0223 07/21/21 0518 07/22/21 0401 07/23/21 0333 07/25/21 0706  NA 136 133* 137 137 138  K 3.5 3.3* 4.1 4.1 4.1  CL 101 96* 103 103 101  CO2 '24 24 26 26 30  '$ GLUCOSE 113* 91 89 75 83  BUN 8 6* 7* 6* 7*  CREATININE 0.50* 0.45* 0.52* 0.52* 0.50*  CALCIUM 8.8* 8.9 8.6* 8.6* 8.6*  MG  --  1.1* 1.5* 1.5* 1.5*  PHOS  --   --  2.5  --   --    GFR: Estimated Creatinine Clearance: 66.3 mL/min (A) (by C-G formula based on SCr of 0.5 mg/dL (L)). Liver Function Tests: No results for input(s): AST, ALT, ALKPHOS, BILITOT, PROT, ALBUMIN in the last 168 hours. No results for input(s): LIPASE, AMYLASE in the last 168 hours. Recent Labs  Lab 07/20/21 0813  AMMONIA 36*   Coagulation Profile: No results for input(s): INR, PROTIME in the last 168 hours. Cardiac Enzymes: No results for input(s): CKTOTAL, CKMB, CKMBINDEX, TROPONINI in the last 168 hours. BNP (last 3 results) No results for input(s): PROBNP in the last 8760 hours. HbA1C: No results for input(s): HGBA1C in the last 72 hours. CBG: Recent Labs  Lab 07/25/21 0121 07/25/21 0614 07/25/21 1854 07/25/21 2349 07/26/21 0631  GLUCAP 113* 73 100* 74 77   Lipid Profile: No results for input(s): CHOL, HDL, LDLCALC, TRIG, CHOLHDL, LDLDIRECT in the last 72 hours. Thyroid Function Tests: No results for input(s): TSH, T4TOTAL, FREET4, T3FREE, THYROIDAB in the last 72 hours. Anemia Panel: No results for input(s): VITAMINB12, FOLATE, FERRITIN, TIBC, IRON, RETICCTPCT in the last 72 hours. Sepsis Labs: No results for input(s): PROCALCITON, LATICACIDVEN in the last 168 hours.  Recent Results (from the past 240 hour(s))  Resp Panel by RT-PCR (Flu A&B, Covid) Nasopharyngeal Swab     Status: None   Collection Time: 07/17/21  6:59 AM   Specimen: Nasopharyngeal Swab;  Nasopharyngeal(NP) swabs in vial transport medium  Result Value Ref Range Status   SARS Coronavirus 2 by RT PCR NEGATIVE NEGATIVE Final    Comment: (NOTE) SARS-CoV-2 target nucleic acids are NOT DETECTED.  The SARS-CoV-2 RNA is generally detectable in upper respiratory specimens during the acute phase of infection. The lowest concentration of SARS-CoV-2 viral copies this assay can detect is 138 copies/mL. A negative result does not preclude SARS-Cov-2 infection and should not be used as the sole basis for treatment or other patient management decisions. A negative result may occur with  improper specimen collection/handling, submission of specimen other than nasopharyngeal swab, presence of viral mutation(s) within the areas targeted by this assay, and inadequate number of viral  copies(<138 copies/mL). A negative result must be combined with clinical observations, patient history, and epidemiological information. The expected result is Negative.  Fact Sheet for Patients:  EntrepreneurPulse.com.au  Fact Sheet for Healthcare Providers:  IncredibleEmployment.be  This test is no t yet approved or cleared by the [place] FDA and  has been authorized for detection and/or diagnosis of SARS-CoV-2 by FDA under an Emergency Use Authorization (EUA). This EUA will remain  in effect (meaning this test can be used) for the duration of the COVID-19 declaration under Section 564(b)(1) of the Act, 21 U.S.C.section 360bbb-3(b)(1), unless the authorization is terminated  or revoked sooner.       Influenza A by PCR NEGATIVE NEGATIVE Final   Influenza B by PCR NEGATIVE NEGATIVE Final    Comment: (NOTE) The Xpert Xpress SARS-CoV-2/FLU/RSV plus assay is intended as an aid in the diagnosis of influenza from Nasopharyngeal swab specimens and should not be used as a sole basis for treatment. Nasal washings and aspirates are unacceptable for Xpert Xpress  SARS-CoV-2/FLU/RSV testing.  Fact Sheet for Patients: EntrepreneurPulse.com.au  Fact Sheet for Healthcare Providers: IncredibleEmployment.be  This test is not yet approved or cleared by the [place] FDA and has been authorized for detection and/or diagnosis of SARS-CoV-2 by FDA under an Emergency Use Authorization (EUA). This EUA will remain in effect (meaning this test can be used) for the duration of the COVID-19 declaration under Section 564(b)(1) of the Act, 21 U.S.C. section 360bbb-3(b)(1), unless the authorization is terminated or revoked.  Performed at [facility], [place] [address] [place], [place] [address]          Radiology Studies: No results found.      Scheduled Meds:  acetaminophen  500 mg Oral TID   atorvastatin  20 mg Oral Daily   divalproex  500 mg Oral Q8H   enoxaparin (LOVENOX) injection  40 mg Subcutaneous Q24H   FLUoxetine  40 mg Oral Daily   levETIRAcetam  500 mg Oral BID   magnesium oxide  400 mg Oral BID   mirtazapine  30 mg Oral QHS   mupirocin ointment   Topical BID   nystatin  5 mL Oral QID   thiamine injection  100 mg Intravenous Daily   Or   thiamine  100 mg Oral Daily   Continuous Infusions:  dextrose 75 mL/hr at 07/26/21 0103     LOS: 9 days    Time spent: 35 mins.More than 50% of that time was spent in counseling and/or coordination of care.      Shelly Coss, MD [facility] P8/08/2021, 7:49 AM

## 2021-07-26 NOTE — Progress Notes (Signed)
  Speech Language Pathology Treatment: Dysphagia  Patient Details Name: Brad [name] MRN: [id number] DOB: 1946-03-05 Today's Date: 07/26/2021 Time: [id number] SLP Time Calculation (min) (ACUTE ONLY): 16 min  Assessment / Plan / Recommendation Clinical Impression  SLP called pt's sister and educated her and the pt to results of his MBS.  Advised oral more than pharyngeal dyphagia present and suspect this is contributing to his FTT.  Reviewed aspiration precautions and advise to give pt po when he is accepting/tolerating.  Discussed gustatory changes with dementia.  Sister, Brad [name], reports MD advising hospice and she questioned this recommendation, SLP advised sister to follow MD recommendations.  Posted new swallow precautions.    HPI HPI: Patient is a 75 y.o. male with PMH: dementia, HTN, DM2, prostate CA, seizure disorder, substance abuse who lives in a nursing home and is bedbound with multiple contractures who was sent in from a [place] for seizures.  EMS was called and he was noted to have right gaze preference with facial twitching.  He was treated with Versed with some improvement and then seizures recurred again. He has h/o dysphagia and has been seen by ST services during previous admissions. MBS in 2020 revealed mild pharyngeal phase dysphagia but esophagram that same day revealed moderate esophageal dysmotility with no evidence of esophageal stricture or hiatal hernia. MBS ordered      SLP Plan  Continue with current plan of care;MBS       Recommendations  Diet recommendations: Dysphagia 1 (puree);Thin liquid Liquids provided via: Straw;Cup Medication Administration: Crushed with puree Supervision: Full supervision/cueing for compensatory strategies;Staff to assist with self feeding Compensations: Minimize environmental distractions;Slow rate;Small sips/bites;Other (Comment) Postural Changes and/or Swallow Maneuvers: Seated upright 90 degrees;Upright 30-60 min  after meal                Oral Care Recommendations: Oral care BID;Staff/trained caregiver to provide oral care Follow up Recommendations: Skilled Nursing facility SLP Visit Diagnosis: Dysphagia, oropharyngeal phase (R13.12) Plan: Continue with current plan of care;MBS       GO               Brad Lime, MS [facility] SLP Acute Rehab Services Office [phone] Pager [phone]  Brad [name] 07/26/2021, 10:15 AM

## 2021-07-26 NOTE — Progress Notes (Signed)
Modified Barium Swallow Progress Note  Patient Details  Name: Brad [name] MRN: [id number] Date of Birth: Sep 19, 1946  Today's Date: 07/26/2021  Modified Barium Swallow completed.  Full report located under Chart Review in the Imaging Section.  Brief recommendations include the following:  Clinical Impression  Suboptimal view due to shoulder elevated. Pt was tested with ultrathin barium - thin barium mixed with 50% water.  Pt presents with mild oropharyngeal dysphagia characteried by impaired oral coordination resulting in delayed oral transiting, premaure spillage of liquids into phayrnx and oral retention.  Pharyngeal swallow marked by minimal delay in swallow reflex with liquid swallow triggering at pyriform sinus.  Trace penetration of thin liquids noted that did not clear - but even with sequential swallows, penetration was trace. Pharyngeal swallow is strong without retention noted fortunately. Suspect his oral deficits may contributing to decreased intake.  Pt admits to getting"choked" sometimes when eating - reporting occuring more with food than drink. He does require time for second swallows to clear oral retention of solids/puree.  Do not recommend solids requiring mastication due to level of deficits.  Recommend continue diet with strict precautions. Quick esophageal scan, did not show any overt indications of deficits - Radiologist not present to confirm and the MBS does not diagnosis in this area.  Educated pt to findings/recommendations.  Will follow up briefly for family/pt education and tolerance. Recommend maximize liquid nutrition given improved efficiency with swallow.   Swallow Evaluation Recommendations       SLP Diet Recommendations: Dysphagia 1 (Puree) solids;Thin liquid           Supervision: Staff to assist with self feeding;Full supervision/cueing for compensatory strategies   Compensations: Minimize environmental distractions;Slow rate;Small sips/bites;Other  (Comment)   Postural Changes: Remain semi-upright after after feeds/meals (Comment);Seated upright at 90 degrees   Oral Care Recommendations: Other (Comment) (oral care AFTER MEALS)      Kathleen Lime, MS [facility] SLP Acute Rehab Services Office [phone] Pager [phone]   Macario Golds 07/26/2021,9:21 AM

## 2021-07-27 LAB — BASIC METABOLIC PANEL
Anion gap: 7 (ref 5–15)
BUN: 5 mg/dL — ABNORMAL LOW (ref 8–23)
CO2: 29 mmol/L (ref 22–32)
Calcium: 8.4 mg/dL — ABNORMAL LOW (ref 8.9–10.3)
Chloride: 101 mmol/L (ref 98–111)
Creatinine, Ser: 0.45 mg/dL — ABNORMAL LOW (ref 0.61–1.24)
GFR, Estimated: >60 mL/min (ref >60)
Glucose, Bld: 91 mg/dL (ref 70–99)
Potassium: 3.7 mmol/L (ref 3.5–5.1)
Sodium: 137 mmol/L (ref 135–145)

## 2021-07-27 LAB — RESP PANEL BY RT-PCR (FLU A&B, COVID) ARPGX2
Influenza A by PCR: NEGATIVE
Influenza B by PCR: NEGATIVE
SARS Coronavirus 2 by RT PCR: NEGATIVE

## 2021-07-27 LAB — GLUCOSE, CAPILLARY
Glucose-Capillary: 72 mg/dL (ref 70–99)
Glucose-Capillary: 75 mg/dL (ref 70–99)
Glucose-Capillary: 90 mg/dL (ref 70–99)
Glucose-Capillary: 99 mg/dL (ref 70–99)

## 2021-07-27 LAB — VITAMIN B1: Vitamin B1 (Thiamine): 109.3 nmol/L (ref 66.5–200.0)

## 2021-07-27 LAB — MAGNESIUM: Magnesium: 1.5 mg/dL — ABNORMAL LOW (ref 1.7–2.4)

## 2021-07-27 MED ORDER — THIAMINE HCL 100 MG PO TABS: 100.0000 mg | ORAL_TABLET | Freq: Every day | ORAL | Status: AC

## 2021-07-27 MED ORDER — MAGNESIUM OXIDE -MG SUPPLEMENT 400 (240 MG) MG PO TABS
800.0000 mg | ORAL_TABLET | Freq: Two times a day (BID) | ORAL | Status: DC
  Administered 2021-07-27 – 2021-07-28 (×3): 800 mg via ORAL
  Filled 2021-07-27 (×3): qty 2

## 2021-07-27 MED ORDER — NYSTATIN 100000 UNIT/ML MT SUSP
5.0000 mL | Freq: Four times a day (QID) | OROMUCOSAL | 0 refills | Status: DC
Start: 2021-07-27 — End: 2021-07-28

## 2021-07-27 MED ORDER — DIVALPROEX SODIUM 125 MG PO CSDR: 500.0000 mg | DELAYED_RELEASE_CAPSULE | Freq: Three times a day (TID) | ORAL | Status: AC

## 2021-07-27 MED ORDER — LEVETIRACETAM 100 MG/ML PO SOLN: 500.0000 mg | Freq: Two times a day (BID) | ORAL | 12 refills | Status: AC

## 2021-07-27 MED ORDER — MAGNESIUM OXIDE -MG SUPPLEMENT 400 (240 MG) MG PO TABS: 800.0000 mg | ORAL_TABLET | Freq: Two times a day (BID) | ORAL | Status: AC

## 2021-07-27 NOTE — Progress Notes (Signed)
Presents with PROGRESS NOTE    Brad [name]  [id number] DOB: [birth year] DOA: 07/17/2021 PCP: [name], DO   Chief Complain:  Brief Narrative: Brad [name] is an 75 y.o. male with history of dementia, hypertension, diabetes mellitus type 2, prostate CA, seizure disorder, history of substance abuse from skilled nursing facility, bedbound with multiple contractures was sent to the hospital with concerns for seizures. During this hospitalization,patient was noted to have ongoing seizures and neurology followed the patient.  Antiepileptic medications were adjusted.  Palliative care was consulted to discuss about goals of care.  He was quite encephalopathic on presentation, mental status is currently at baseline. Palliative care was also following during this admission.  Plan is to discharge him back to skilled nursing facility.  TOC following.  Medically stable for discharge whenever possible.  Assessment & Plan:   Principal Problem:   Seizures ([place]) Active Problems:   Hypertension   Diabetes ([place])   Dementia ([facility])   Bedbound   Metabolic encephalopathy: Secondary to seizures, postictal state.  Patient also has underlying dementia.  Neurology was consulted and following here.  Currently seizure-free.  Mental status improved and now probably at baseline.    Dysphagia/poor oral intake: Underwent MBS, recommended dysphagia 1 diet.  He has poor oral intake.  Also ordered ensure  Seizure disorder: Neurology was following.  Valproic acid was subtherapeutic on presentation.  Was on continuous EEG here.  Currently on Keppra, Depakote.  We recommend to follow-up with neurology as an outpatient in 10 to 12 weeks.  Oral thrush: Continue nystatin  Hypertension: Blood pressure  stable.  Antihypertensives on hold.    Failure to thrive: Severely malnourished.  Failure to thrive.  Palliative care following.  Anxiety/depression:On  Mirtazapine, fluoxetine  Chronic pain syndrome: Was on  tramadol at home.  Has been discontinued in the setting of seizures.  Hypomagnesemia: Continue to monitor and supplement.  High TSH: Normal T4.  Check TFT in few weeks.  Goals of care: Multiple comorbidities, underlying dementia.  Has poor oral intake very poor quality of life due to bilateral lower extremity strictures/atrophy, patient is nonambulatory.  Lives in a skilled nursing facility.  Palliative care has been involved.  He might be candidate for hospice due to his poor oral intake.  Family still not ready for that. Pressure Injury 12/05/20 Coccyx Medial Stage 2 -  Partial thickness loss of dermis presenting as a shallow open injury with a red, pink wound bed without slough. 21m diameter open circular abrasion, top layer skin not intact, red and blanchable, superior (Active)  12/05/20 2145  Location: Coccyx  Location Orientation: Medial  Staging: Stage 2 -  Partial thickness loss of dermis presenting as a shallow open injury with a red, pink wound bed without slough.  Wound Description (Comments): 359mdiameter open circular abrasion, top layer skin not intact, red and blanchable, superior to medial coccyx  Present on Admission: Yes     Pressure Injury 12/22/20 Ankle Anterior;Right Deep Tissue Pressure Injury - Purple or maroon localized area of discolored intact skin or blood-filled blister due to damage of underlying soft tissue from pressure and/or shear. purple ecchymosis 1.8x1.5cm  (Active)  12/22/20 1720  Location: Ankle  Location Orientation: Anterior;Right  Staging: Deep Tissue Pressure Injury - Purple or maroon localized area of discolored intact skin or blood-filled blister due to damage of underlying soft tissue from pressure and/or shear.  Wound Description (Comments): purple ecchymosis 1.8x1.5cm near maleolus  Present on Admission: No  Pressure Injury 12/22/20 Heel Left Deep Tissue Pressure Injury - Purple or maroon localized area of discolored intact skin or blood-filled  blister due to damage of underlying soft tissue from pressure and/or shear. 9x6cm dark pink/ red area across heel (Active)  12/22/20 1720  Location: Heel  Location Orientation: Left  Staging: Deep Tissue Pressure Injury - Purple or maroon localized area of discolored intact skin or blood-filled blister due to damage of underlying soft tissue from pressure and/or shear.  Wound Description (Comments): 9x6cm dark pink/ red area across heel  Present on Admission: No     Pressure Injury 12/22/20 Heel Right Deep Tissue Pressure Injury - Purple or maroon localized area of discolored intact skin or blood-filled blister due to damage of underlying soft tissue from pressure and/or shear. 4x7cm dark purple area across heel (Active)  12/22/20 1720  Location: Heel  Location Orientation: Right  Staging: Deep Tissue Pressure Injury - Purple or maroon localized area of discolored intact skin or blood-filled blister due to damage of underlying soft tissue from pressure and/or shear.  Wound Description (Comments): 4x7cm dark purple area across heel  Present on Admission: No     Pressure Injury 12/26/20 Foot Anterior;Left (Active)  12/26/20 1953  Location: Foot  Location Orientation: Anterior;Left  Staging:   Wound Description (Comments):   Present on Admission: No     Pressure Injury 02/28/21 Heel Right Stage 3 -  Full thickness tissue loss. Subcutaneous fat may be visible but bone, tendon or muscle are NOT exposed. (Active)  02/28/21 1830  Location: Heel  Location Orientation: Right  Staging: Stage 3 -  Full thickness tissue loss. Subcutaneous fat may be visible but bone, tendon or muscle are NOT exposed.  Wound Description (Comments):   Present on Admission: Yes     Pressure Injury 02/28/21 Thigh Left;Posterior;Proximal light pink round ulcer (Active)  02/28/21 1854  Location: Thigh  Location Orientation: Left;Posterior;Proximal  Staging:   Wound Description (Comments): light pink round ulcer   Present on Admission: Yes            DVT prophylaxis:Lovenox Code Status: DNR Family Communication: Talked to sister on phone on 07/25/20 Status is: Inpatient  Remains inpatient appropriate because:Unsafe d/c plan  Dispo: The patient is from: Home              Anticipated d/c is to: SNF               Patient currently is medically stable for dc   Difficult to place patient No     Consultants: Neurology,palliative care  Procedures:EEG  Antimicrobials:  Anti-infectives (From admission, onward)    None       Subjective: Patient seen and examined at the bedside this morning.  Slumped on the bed.  Opened his eyes and looked at me.  Denies any complaints today.  Overall looks comfortable.  Objective: Vitals:   07/26/21 1523 07/26/21 1942 07/27/21 0041 07/27/21 0351  BP: (!) 151/74 127/78 (!) 150/68 128/67  Pulse: 77 86 84 71  Resp: '20 17 17 16  '$ Temp: 98.3 F (36.8 C) 98 F (36.7 C) 97.9 F (36.6 C) 97.9 F (36.6 C)  TempSrc: Oral Oral Oral Oral  SpO2: 100% 95% 98% 100%  Weight:        Intake/Output Summary (Last 24 hours) at 07/27/2021 0742 Last data filed at 07/27/2021 0352 Gross per 24 hour  Intake 1817.03 ml  Output 1601 ml  Net 216.03 ml   Filed Weights   07/19/21  0900  Weight: 57 kg    Examination:   General exam: Extremely deconditioned, debilitated, chronically ill looking, bedbound  HEENT: PERRL Respiratory system:  no wheezes or crackles  Cardiovascular system: S1 & S2 heard, RRR.  Gastrointestinal system: Abdomen is nondistended, soft and nontender. Central nervous system: Alert and oriented Extremities: Strictures, contractures of bilateral lower extremities  skin: Pressure ulcers as above  Data Reviewed: I have personally reviewed following labs and imaging studies  CBC: Recent Labs  Lab 07/21/21 0518 07/22/21 0401 07/25/21 0706  WBC 12.1* 11.7* 7.6  NEUTROABS  --   --  4.5  HGB 11.7* 11.3* 11.0*  HCT 36.0* 34.4* 33.9*   MCV 91.8 93.2 95.5  PLT 294 264 [id number]   Basic Metabolic Panel: Recent Labs  Lab 07/21/21 0518 07/22/21 0401 07/23/21 0333 07/25/21 0706 07/27/21 0310  NA 133* 137 137 138 137  K 3.3* 4.1 4.1 4.1 3.7  CL 96* 103 103 101 101  CO2 '24 26 26 30 29  '$ GLUCOSE 91 89 75 83 91  BUN 6* 7* 6* 7* 5*  CREATININE 0.45* 0.52* 0.52* 0.50* 0.45*  CALCIUM 8.9 8.6* 8.6* 8.6* 8.4*  MG 1.1* 1.5* 1.5* 1.5* 1.5*  PHOS  --  2.5  --   --   --    GFR: Estimated Creatinine Clearance: 66.3 mL/min (A) (by C-G formula based on SCr of 0.45 mg/dL (L)). Liver Function Tests: No results for input(s): AST, ALT, ALKPHOS, BILITOT, PROT, ALBUMIN in the last 168 hours. No results for input(s): LIPASE, AMYLASE in the last 168 hours. Recent Labs  Lab 07/20/21 0813  AMMONIA 36*   Coagulation Profile: No results for input(s): INR, PROTIME in the last 168 hours. Cardiac Enzymes: No results for input(s): CKTOTAL, CKMB, CKMBINDEX, TROPONINI in the last 168 hours. BNP (last 3 results) No results for input(s): PROBNP in the last 8760 hours. HbA1C: No results for input(s): HGBA1C in the last 72 hours. CBG: Recent Labs  Lab 07/25/21 2349 07/26/21 0631 07/26/21 1315 07/27/21 0048 07/27/21 0623  GLUCAP 74 77 99 99 72   Lipid Profile: No results for input(s): CHOL, HDL, LDLCALC, TRIG, CHOLHDL, LDLDIRECT in the last 72 hours. Thyroid Function Tests: No results for input(s): TSH, T4TOTAL, FREET4, T3FREE, THYROIDAB in the last 72 hours. Anemia Panel: No results for input(s): VITAMINB12, FOLATE, FERRITIN, TIBC, IRON, RETICCTPCT in the last 72 hours. Sepsis Labs: No results for input(s): PROCALCITON, LATICACIDVEN in the last 168 hours.  No results found for this or any previous visit (from the past 240 hour(s)).        Radiology Studies: DG Swallowing Func-Speech Pathology  Result Date: 07/26/2021 Formatting of this result is different from the original. Objective Swallowing Evaluation: Type of Study:  MBS-Modified Barium Swallow Study  Patient Details Name: ARISTIDE SHEERIN MRN: [id number] Date of Birth: 07-May-1946 Today's Date: 07/26/2021 Time: SLP Start Time (ACUTE ONLY): [id number] -SLP Stop Time (ACUTE ONLY): 0835 SLP Time Calculation (min) (ACUTE ONLY): 24 min Past Medical History: Past Medical History: Diagnosis Date  Anemia   Arthritis   Cancer ([place]) 2017  prostate  Depression   Diabetes mellitus without complication ([place])   History of stomach ulcers   Hypertension   Seizures ([place])   Substance abuse ([place])  Past Surgical History: Past Surgical History: Procedure Laterality Date  herniated disc    KNEE SURGERY Right  HPI: Patient is a 75 y.o. male with PMH: dementia, HTN, DM2, prostate CA, seizure disorder, substance abuse who lives in a  nursing home and is bedbound with multiple contractures who was sent in from a [place] for seizures.  EMS was called and he was noted to have right gaze preference with facial twitching.  He was treated with Versed with some improvement and then seizures recurred again. He has h/o dysphagia and has been seen by ST services during previous admissions. MBS in 2020 revealed mild pharyngeal phase dysphagia but esophagram that same day revealed moderate esophageal dysmotility with no evidence of esophageal stricture or hiatal hernia.  Subjective: awake in flouro chair Assessment / Plan / Recommendation CHL IP CLINICAL IMPRESSIONS 07/26/2021 Clinical Impression Suboptimal view due to shoulder elevated. Pt was tested with ultrathin barium - thin barium mixed with 50% water.  Pt presents with mild oropharyngeal dysphagia characteried by impaired oral coordination resulting in delayed oral transiting, premaure spillage of liquids into phayrnx and oral retention.  Pharyngeal swallow marked by minimal delay in swallow reflex with liquid swallow triggering at pyriform sinus.  Trace penetration of thin liquids noted that did not clear - but even with sequential swallows,  penetration was trace. Pharyngeal swallow is strong without retention noted fortunately. Suspect his oral deficits may contributing to decreased intake.  Pt admits to getting"choked" sometimes when eating - reporting occuring more with food than drink. He does require time for second swallows to clear oral retention of solids/puree.  Do not recommend solids requiring mastication due to level of deficits.  Recommend continue diet with strict precautions. Quick esophageal scan, did not show any overt indications of deficits - Radiologist not present to confirm and the MBS does not diagnosis in this area.  Educated pt to findings/recommendations.  Will follow up briefly for family/pt education and tolerance. Recommend maximize liquid nutrition given improved efficiency with swallow. SLP Visit Diagnosis Dysphagia, oropharyngeal phase (R13.12) Attention and concentration deficit following -- Frontal lobe and executive function deficit following -- Impact on safety and function Moderate aspiration risk   CHL IP TREATMENT RECOMMENDATION 07/26/2021 Treatment Recommendations Therapy as outlined in treatment plan below   Prognosis 07/26/2021 Prognosis for Safe Diet Advancement Good Barriers to Reach Goals Cognitive deficits;Time post onset;Severity of deficits Barriers/Prognosis Comment -- CHL IP DIET RECOMMENDATION 07/26/2021 SLP Diet Recommendations Dysphagia 1 (Puree) solids;Thin liquid Liquid Administration via -- Medication Administration -- Compensations Minimize environmental distractions;Slow rate;Small sips/bites;Other (Comment) Postural Changes Remain semi-upright after after feeds/meals (Comment);Seated upright at 90 degrees   CHL IP OTHER RECOMMENDATIONS 07/26/2021 Recommended Consults -- Oral Care Recommendations Other (Comment) Other Recommendations --   CHL IP FOLLOW UP RECOMMENDATIONS 07/26/2021 Follow up Recommendations Skilled Nursing facility   [facility] IP FREQUENCY AND DURATION 07/26/2021 Speech Therapy Frequency (ACUTE  ONLY) min 1 x/week Treatment Duration 1 week      CHL IP ORAL PHASE 07/26/2021 Oral Phase Impaired Oral - Pudding Teaspoon -- Oral - Pudding Cup -- Oral - Honey Teaspoon -- Oral - Honey Cup -- Oral - Nectar Teaspoon Delayed oral transit;Premature spillage;Weak lingual manipulation Oral - Nectar Cup Delayed oral transit;Premature spillage;Weak lingual manipulation Oral - Nectar Straw Delayed oral transit;Premature spillage;Weak lingual manipulation Oral - Thin Teaspoon Weak lingual manipulation;Premature spillage;Delayed oral transit Oral - Thin Cup Weak lingual manipulation;Premature spillage;Delayed oral transit Oral - Thin Straw Delayed oral transit;Premature spillage;Weak lingual manipulation Oral - Puree Weak lingual manipulation;Delayed oral transit;Decreased bolus cohesion;Lingual/palatal residue Oral - Mech Soft Delayed oral transit;Weak lingual manipulation;Impaired mastication;Reduced posterior propulsion;Decreased bolus cohesion;Lingual/palatal residue Oral - Regular -- Oral - Multi-Consistency -- Oral - Pill -- Oral Phase - Comment --  CHL IP PHARYNGEAL PHASE 07/26/2021 Pharyngeal Phase Impaired Pharyngeal- Pudding Teaspoon -- Pharyngeal -- Pharyngeal- Pudding Cup -- Pharyngeal -- Pharyngeal- Honey Teaspoon -- Pharyngeal -- Pharyngeal- Honey Cup -- Pharyngeal -- Pharyngeal- Nectar Teaspoon Delayed swallow initiation-pyriform sinuses;Delayed swallow initiation-vallecula Pharyngeal Material does not enter airway Pharyngeal- Nectar Cup Delayed swallow initiation-pyriform sinuses Pharyngeal Material does not enter airway Pharyngeal- Nectar Straw Delayed swallow initiation-pyriform sinuses Pharyngeal Material does not enter airway Pharyngeal- Thin Teaspoon Delayed swallow initiation-pyriform sinuses Pharyngeal Material does not enter airway Pharyngeal- Thin Cup Delayed swallow initiation-pyriform sinuses;Penetration/Aspiration during swallow Pharyngeal Material enters airway, passes BELOW cords then ejected out  Pharyngeal- Thin Straw Delayed swallow initiation-pyriform sinuses Pharyngeal Material enters airway, remains ABOVE vocal cords and not ejected out Pharyngeal- Puree WFL;Delayed swallow initiation-vallecula Pharyngeal Material does not enter airway Pharyngeal- Mechanical Soft WFL;Delayed swallow initiation-vallecula Pharyngeal Material does not enter airway Pharyngeal- Regular -- Pharyngeal -- Pharyngeal- Multi-consistency -- Pharyngeal -- Pharyngeal- Pill -- Pharyngeal -- Pharyngeal Comment --  CHL IP CERVICAL ESOPHAGEAL PHASE 07/26/2021 Cervical Esophageal Phase WFL Pudding Teaspoon -- Pudding Cup -- Honey Teaspoon -- Honey Cup -- Nectar Teaspoon -- Nectar Cup -- Nectar Straw -- Thin Teaspoon -- Thin Cup -- Thin Straw -- Puree -- Mechanical Soft -- Regular -- Multi-consistency -- Pill -- Cervical Esophageal Comment -- Kathleen Lime, MS [facility] SLP Acute Rehab Services Office [phone] Pager [phone] Macario Golds 07/26/2021, 9:22 AM                   Scheduled Meds:  acetaminophen  500 mg Oral TID   atorvastatin  20 mg Oral Daily   divalproex  500 mg Oral Q8H   enoxaparin (LOVENOX) injection  40 mg Subcutaneous Q24H   feeding supplement  237 mL Oral BID BM   FLUoxetine  40 mg Oral Daily   levETIRAcetam  500 mg Oral BID   magnesium oxide  800 mg Oral BID   mirtazapine  30 mg Oral QHS   mupirocin ointment   Topical BID   nystatin  5 mL Oral QID   thiamine injection  100 mg Intravenous Daily   Or   thiamine  100 mg Oral Daily   Continuous Infusions:  dextrose 5 % and 0.9% NaCl 75 mL/hr at 07/27/21 0338     LOS: 10 days    Time spent: 35 mins.More than 50% of that time was spent in counseling and/or coordination of care.      Shelly Coss, MD [facility] P8/09/2021, 7:42 AM

## 2021-07-27 NOTE — Discharge Summary (Addendum)
Physician Discharge Summary  Brad [name] [id number] DOB: Feb 16, 1946 DOA: 07/17/2021  PCP: [name], DO  Admit date: 07/17/2021 Discharge date: 07/28/2021  Admitted From: SNF Disposition:  SNF  Discharge Condition:Stable CODE STATUS:DNR Diet recommendation:  Dysphagia 1  Brief/Interim Summary:  Brad [name] is an 75 y.o. male with history of dementia, hypertension, diabetes mellitus type 2, prostate CA, seizure disorder, history of substance abuse from skilled nursing facility, bedbound with multiple contractures was sent to the hospital with concerns for seizures. During this hospitalization,patient was noted to have ongoing seizures and neurology followed the patient.  Antiepileptic medications were adjusted.  Palliative care was consulted to discuss about goals of care.  He was quite encephalopathic on presentation, mental status is currently at baseline. Palliative care was also following during this admission.  Plan is to discharge him back to skilled nursing facility with palliative care follow up.  TOC following.  Medically stable for discharge .  Following problems were addressed during his hospitalization:  Metabolic encephalopathy: Secondary to seizures, postictal state.  Patient also has underlying dementia.  Neurology was consulted and following here.  Currently seizure-free.  Mental status improved and now probably at baseline.     Dysphagia/poor oral intake: Underwent MBS, recommended dysphagia 1 diet.  He has poor oral intake.  Continue ensure   Seizure disorder: Neurology was following.  Valproic acid was subtherapeutic on presentation.  Was on continuous EEG here.  Currently on Keppra, Depakote.  We recommend to follow-up with neurology as an outpatient in 10 to 12 weeks.   Oral thrush: Continue nystatin   Hypertension: Blood pressure  stable.      Failure to thrive: Severely malnourished.  Failure to thrive.  Palliative care  was following.    Anxiety/depression:On  Mirtazapine, fluoxetine   Chronic pain syndrome: Was on tramadol at home.  Has been discontinued in the setting of seizures.   Hypomagnesemia: Continue to monitor and supplement.   High TSH: Normal T4.  Check TFT in few weeks.  Pressure ulcers: Multiple.  Continue wound care   Goals of care: Multiple comorbidities, underlying dementia.  Has poor oral intake very poor quality of life due to bilateral lower extremity strictures/atrophy, patient is nonambulatory.  Lives in a skilled nursing facility.  Palliative care had been involved.  He is a candidate for hospice due to his poor oral intake.  We recommend palliative care/hospice  follow-up as an outpatient   Discharge Diagnoses:  Principal Problem:   Seizures ([place]) Active Problems:   Hypertension   Diabetes ([place])   Dementia ([place])   Bedbound    Discharge Instructions  Discharge Instructions     Ambulatory referral to Neurology   Complete by: As directed    An appointment is requested in approximately: 4 weeks   Diet general   Complete by: As directed    Dysphagia 1 diet   Discharge instructions   Complete by: As directed    1)Please take prescribed medications as instructed 2)Follow up with neurology as an outpatient.  Name and number of the provider group has been attached 3)Follow up with Palliaitve care as an outpatient 4)Do a CBC and Bmp tests in a week   Discharge wound care:   Complete by: As directed    As per wound nurse   Increase activity slowly   Complete by: As directed       Allergies as of 07/28/2021   No Known Allergies      Medication List  STOP taking these medications    traMADol 50 MG tablet Commonly known as: ULTRAM       TAKE these medications    acetaminophen 500 MG tablet Commonly known as: TYLENOL Take 1,000 mg by mouth every 6 (six) hours as needed for moderate pain.   amLODipine 2.5 MG tablet Commonly known as: NORVASC Take 1 tablet (2.5 mg  total) by mouth daily.   atorvastatin 20 MG tablet Commonly known as: LIPITOR Take 1 tablet (20 mg total) by mouth daily.   divalproex 125 MG capsule Commonly known as: DEPAKOTE SPRINKLE Take 4 capsules (500 mg total) by mouth every 8 (eight) hours. What changed: when to take this   Ensure Take 237 mLs by mouth in the morning, at noon, and at bedtime.   FLUoxetine 20 MG capsule Commonly known as: PROZAC Take 2 capsules (40 mg total) by mouth daily.   levETIRAcetam 100 MG/ML solution Commonly known as: KEPPRA Take 5 mLs (500 mg total) by mouth 2 (two) times daily.   magnesium oxide 400 (240 Mg) MG tablet Commonly known as: MAG-OX Take 2 tablets (800 mg total) by mouth 2 (two) times daily.   methocarbamol 500 MG tablet Commonly known as: ROBAXIN Take 500 mg by mouth every 8 (eight) hours as needed for muscle spasms.   mirtazapine 30 MG tablet Commonly known as: REMERON Take 30 mg by mouth at bedtime.   multivitamin with minerals Tabs tablet Take 1 tablet by mouth daily.   thiamine 100 MG tablet Take 1 tablet (100 mg total) by mouth daily.       ASK your doctor about these medications    Vitamin D2 50 MCG (2000 UT) Tabs Take 2,000 Units by mouth daily. For 30 days               Discharge Care Instructions  (From admission, onward)           Start     Ordered   07/27/21 0000  Discharge wound care:       Comments: As per wound nurse   07/27/21 1157            No Known Allergies  Consultations: Neurology,palliative care   Procedures/Studies: CT Head Wo Contrast  Result Date: 07/17/2021 CLINICAL DATA:  Altered mental status.  Head trauma. EXAM: CT HEAD WITHOUT CONTRAST TECHNIQUE: Contiguous axial images were obtained from the base of the skull through the vertex without intravenous contrast. COMPARISON:  02/28/2021 FINDINGS: Brain: Age advanced cerebral atrophy. Moderate ventriculomegaly is similar. Moderate periventricular white matter  hypoattenuation may be slightly progressive. Suspect remote right basal ganglia lacunar infarcts. No hemorrhage, intra-axial, or extra-axial fluid collection. Vascular: No hyperdense vessel or unexpected calcification. Intracranial atherosclerosis. Skull: No significant soft tissue swelling.  No skull fracture. Sinuses/Orbits: Normal imaged portions of the orbits and globes. Clear paranasal sinuses and mastoid air cells. Other: None. IMPRESSION: 1. Cerebral atrophy with moderate similar ventriculomegaly. Ventriculomegaly could be secondary to atrophy or represent normal pressure hydrocephalus. Periventricular white matter hypoattenuation may be slightly progressive and could represent small vessel ischemic change or transependymal CSF resorption. 2. Otherwise, no acute intracranial abnormality. Electronically Signed   By: Abigail Miyamoto M.D.   On: 07/17/2021 11:45   DG Chest Portable 1 View  Result Date: 07/17/2021 CLINICAL DATA:  Acute mental status change.  Seizure. EXAM: PORTABLE CHEST 1 VIEW COMPARISON:  February 28, 2021 FINDINGS: No pneumothorax. The cardiomediastinal silhouette is stable. The lungs are clear. No acute abnormalities are identified. IMPRESSION:  No active disease. Electronically Signed   By: Dorise Bullion III M.D   On: 07/17/2021 09:16   DG Swallowing Func-Speech Pathology  Result Date: 07/26/2021 Formatting of this result is different from the original. Objective Swallowing Evaluation: Type of Study: MBS-Modified Barium Swallow Study  Patient Details Name: RAYYAAN BEGGS MRN: [id number] Date of Birth: [birth year] Today's Date: 07/26/2021 Time: SLP Start Time (ACUTE ONLY): [id number] -SLP Stop Time (ACUTE ONLY): 0835 SLP Time Calculation (min) (ACUTE ONLY): 24 min Past Medical History: Past Medical History: Diagnosis Date  Anemia   Arthritis   Cancer ([place]) 2017  prostate  Depression   Diabetes mellitus without complication ([place])   History of stomach ulcers   Hypertension   Seizures ([place])   Substance  abuse ([place])  Past Surgical History: Past Surgical History: Procedure Laterality Date  herniated disc    KNEE SURGERY Right  HPI: Patient is a 75 y.o. male with PMH: dementia, HTN, DM2, prostate CA, seizure disorder, substance abuse who lives in a nursing home and is bedbound with multiple contractures who was sent in from a [place] for seizures.  EMS was called and he was noted to have right gaze preference with facial twitching.  He was treated with Versed with some improvement and then seizures recurred again. He has h/o dysphagia and has been seen by ST services during previous admissions. MBS in 2020 revealed mild pharyngeal phase dysphagia but esophagram that same day revealed moderate esophageal dysmotility with no evidence of esophageal stricture or hiatal hernia.  Subjective: awake in flouro chair Assessment / Plan / Recommendation CHL IP CLINICAL IMPRESSIONS 07/26/2021 Clinical Impression Suboptimal view due to shoulder elevated. Pt was tested with ultrathin barium - thin barium mixed with 50% water.  Pt presents with mild oropharyngeal dysphagia characteried by impaired oral coordination resulting in delayed oral transiting, premaure spillage of liquids into phayrnx and oral retention.  Pharyngeal swallow marked by minimal delay in swallow reflex with liquid swallow triggering at pyriform sinus.  Trace penetration of thin liquids noted that did not clear - but even with sequential swallows, penetration was trace. Pharyngeal swallow is strong without retention noted fortunately. Suspect his oral deficits may contributing to decreased intake.  Pt admits to getting"choked" sometimes when eating - reporting occuring more with food than drink. He does require time for second swallows to clear oral retention of solids/puree.  Do not recommend solids requiring mastication due to level of deficits.  Recommend continue diet with strict precautions. Quick esophageal scan, did not show any overt  indications of deficits - Radiologist not present to confirm and the MBS does not diagnosis in this area.  Educated pt to findings/recommendations.  Will follow up briefly for family/pt education and tolerance. Recommend maximize liquid nutrition given improved efficiency with swallow. SLP Visit Diagnosis Dysphagia, oropharyngeal phase (R13.12) Attention and concentration deficit following -- Frontal lobe and executive function deficit following -- Impact on safety and function Moderate aspiration risk   CHL IP TREATMENT RECOMMENDATION 07/26/2021 Treatment Recommendations Therapy as outlined in treatment plan below   Prognosis 07/26/2021 Prognosis for Safe Diet Advancement Good Barriers to Reach Goals Cognitive deficits;Time post onset;Severity of deficits Barriers/Prognosis Comment -- CHL IP DIET RECOMMENDATION 07/26/2021 SLP Diet Recommendations Dysphagia 1 (Puree) solids;Thin liquid Liquid Administration via -- Medication Administration -- Compensations Minimize environmental distractions;Slow rate;Small sips/bites;Other (Comment) Postural Changes Remain semi-upright after after feeds/meals (Comment);Seated upright at 90 degrees   CHL IP OTHER RECOMMENDATIONS 07/26/2021 Recommended Consults -- Oral Care Recommendations Other (  Comment) Other Recommendations --   CHL IP FOLLOW UP RECOMMENDATIONS 07/26/2021 Follow up Recommendations Skilled Nursing facility   [facility] IP FREQUENCY AND DURATION 07/26/2021 Speech Therapy Frequency (ACUTE ONLY) min 1 x/week Treatment Duration 1 week      CHL IP ORAL PHASE 07/26/2021 Oral Phase Impaired Oral - Pudding Teaspoon -- Oral - Pudding Cup -- Oral - Honey Teaspoon -- Oral - Honey Cup -- Oral - Nectar Teaspoon Delayed oral transit;Premature spillage;Weak lingual manipulation Oral - Nectar Cup Delayed oral transit;Premature spillage;Weak lingual manipulation Oral - Nectar Straw Delayed oral transit;Premature spillage;Weak lingual manipulation Oral - Thin Teaspoon Weak lingual manipulation;Premature  spillage;Delayed oral transit Oral - Thin Cup Weak lingual manipulation;Premature spillage;Delayed oral transit Oral - Thin Straw Delayed oral transit;Premature spillage;Weak lingual manipulation Oral - Puree Weak lingual manipulation;Delayed oral transit;Decreased bolus cohesion;Lingual/palatal residue Oral - Mech Soft Delayed oral transit;Weak lingual manipulation;Impaired mastication;Reduced posterior propulsion;Decreased bolus cohesion;Lingual/palatal residue Oral - Regular -- Oral - Multi-Consistency -- Oral - Pill -- Oral Phase - Comment --  CHL IP PHARYNGEAL PHASE 07/26/2021 Pharyngeal Phase Impaired Pharyngeal- Pudding Teaspoon -- Pharyngeal -- Pharyngeal- Pudding Cup -- Pharyngeal -- Pharyngeal- Honey Teaspoon -- Pharyngeal -- Pharyngeal- Honey Cup -- Pharyngeal -- Pharyngeal- Nectar Teaspoon Delayed swallow initiation-pyriform sinuses;Delayed swallow initiation-vallecula Pharyngeal Material does not enter airway Pharyngeal- Nectar Cup Delayed swallow initiation-pyriform sinuses Pharyngeal Material does not enter airway Pharyngeal- Nectar Straw Delayed swallow initiation-pyriform sinuses Pharyngeal Material does not enter airway Pharyngeal- Thin Teaspoon Delayed swallow initiation-pyriform sinuses Pharyngeal Material does not enter airway Pharyngeal- Thin Cup Delayed swallow initiation-pyriform sinuses;Penetration/Aspiration during swallow Pharyngeal Material enters airway, passes BELOW cords then ejected out Pharyngeal- Thin Straw Delayed swallow initiation-pyriform sinuses Pharyngeal Material enters airway, remains ABOVE vocal cords and not ejected out Pharyngeal- Puree WFL;Delayed swallow initiation-vallecula Pharyngeal Material does not enter airway Pharyngeal- Mechanical Soft WFL;Delayed swallow initiation-vallecula Pharyngeal Material does not enter airway Pharyngeal- Regular -- Pharyngeal -- Pharyngeal- Multi-consistency -- Pharyngeal -- Pharyngeal- Pill -- Pharyngeal -- Pharyngeal Comment --  CHL IP  CERVICAL ESOPHAGEAL PHASE 07/26/2021 Cervical Esophageal Phase WFL Pudding Teaspoon -- Pudding Cup -- Honey Teaspoon -- Honey Cup -- Nectar Teaspoon -- Nectar Cup -- Nectar Straw -- Thin Teaspoon -- Thin Cup -- Thin Straw -- Puree -- Mechanical Soft -- Regular -- Multi-consistency -- Pill -- Cervical Esophageal Comment -- Kathleen Lime, MS [facility] SLP Acute Rehab Services Office [phone] Pager [phone] Macario Golds 07/26/2021, 9:22 AM              EEG adult  Result Date: 07/17/2021 Lora Havens, MD     07/17/2021 11:00 AM Patient Name: Brad [name] MRN: [id number] Epilepsy Attending: Lora Havens Referring Physician/Provider: Anibal Henderson, NP Date: 07/17/2021 Duration: 27.20 mins Patient history:  75yo M with h/o epilepsy presented with seizure described as gaze to right and facial twitching upon EMS arrival. EEG to evaluate for seizure Level of alertness:  awake AEDs during EEG study: LEV, VPA, Versed, Ativan Technical aspects: This EEG study was done with scalp electrodes positioned according to the 10-20 International system of electrode placement. Electrical activity was acquired at a sampling rate of '500Hz'$  and reviewed with a high frequency filter of '70Hz'$  and a low frequency filter of '1Hz'$ . EEG data were recorded continuously and digitally stored. Description: EEG showed continuous generalized  3 to 6 Hz theta-delta slowing. Physiologic photic driving was not seen during photic stimulation.  Hyperventilation was not performed.   Of note, eeg was technically difficult due to significant myogenic  artifact. ABNORMALITY - Continuous slow, generalized IMPRESSION: This technically difficult study is suggestive of moderate diffuse encephalopathy, nonspecific etiology. No seizures or epileptiform discharges were seen throughout the recording. If suspicion for interictal activity remains a concern, a prolonged study can be considered. Priyanka Barbra Sarks   Overnight EEG with video  Result Date:  07/19/2021 Lora Havens, MD     07/20/2021  8:23 AM Patient Name: Brad [name] MRN: [id number] Epilepsy Attending: Lora Havens Referring Physician/Provider: Clance Boll, NP Duration: 8/1/20221158 to 07/19/2021 1158  Patient history:  75yo M with h/o epilepsy presented with seizure described as gaze to right and facial twitching upon EMS arrival. EEG to evaluate for seizure  Level of alertness:  awake asleep  AEDs during EEG study: VPA  Technical aspects: This EEG study was done with scalp electrodes positioned according to the 10-20 International system of electrode placement. Electrical activity was acquired at a sampling rate of '500Hz'$  and reviewed with a high frequency filter of '70Hz'$  and a low frequency filter of '1Hz'$ . EEG data were recorded continuously and digitally stored.  Description: During awake state, no clear posterior dominant rhythm was seen. Sleep was characterized by sleep spindles (12 to '14Hz'$ ) EEG showed continuous generalized and lateralized left hemisphere 3 to 6 Hz theta-delta slowing.  Frequent spikes and polyspikes were noted in left frontal region.  Seizures without clinical signs were noted originating from left frontal region on 07/18/2021 at 2128, 2216 and on 07/19/2021 at 0657.  ABNORMALITY - Seizure without clinical sign, left frontal region - Continuous slow, generalized and lateralized left hemisphere  IMPRESSION: This study showed seizures without clinical signs arising from left frontal region, on 07/18/2021 at 2128, 2216 and on 07/19/2021 at 0657.  There is also evidence of cortical dysfunction in left hemisphere likely secondary to underlying structural abnormality, postictal state.  Additionally there is moderate diffuse encephalopathy, nonspecific etiology but likely secondary to seizures.  Priyanka Barbra Sarks      Subjective:  Patient seen and examined at the bedside this morning.  Hemodynamically stable. Discharge Exam: Vitals:   07/28/21 0745 07/28/21 1207  BP: (!)  149/73 (!) 152/66  Pulse: 70 75  Resp: 18 20  Temp: 98 F (36.7 C) 97.9 F (36.6 C)  SpO2: 92% (!) 89%   Vitals:   07/28/21 0047 07/28/21 0317 07/28/21 0745 07/28/21 1207  BP: (!) 155/73 (!) 156/70 (!) 149/73 (!) 152/66  Pulse: 76 72 70 75  Resp: '18 16 18 20  '$ Temp: 98.1 F (36.7 C) 98.4 F (36.9 C) 98 F (36.7 C) 97.9 F (36.6 C)  TempSrc:   Oral Oral  SpO2: 100% 100% 92% (!) 89%  Weight:        General: Pt is not in acute distress deconditioned, chronically ill looking Cardiovascular: RRR, S1/S2 +, no rubs, no gallops Respiratory: CTA bilaterally, no wheezing, no rhonchi Abdominal: Soft, NT, ND, bowel sounds + Extremities: no edema, no cyanosis, chronic bilateral lower extremity contractures/strictures Skin: Pressure ulcers     The results of significant diagnostics from this hospitalization (including imaging, microbiology, ancillary and laboratory) are listed below for reference.     Microbiology: Recent Results (from the past 240 hour(s))  Resp Panel by RT-PCR (Flu A&B, Covid) Nasopharyngeal Swab     Status: None   Collection Time: 07/27/21  1:14 PM   Specimen: Nasopharyngeal Swab; Nasopharyngeal(NP) swabs in vial transport medium  Result Value Ref Range Status   SARS Coronavirus 2 by RT PCR NEGATIVE NEGATIVE Final  Comment: (NOTE) SARS-CoV-2 target nucleic acids are NOT DETECTED.  The SARS-CoV-2 RNA is generally detectable in upper respiratory specimens during the acute phase of infection. The lowest concentration of SARS-CoV-2 viral copies this assay can detect is 138 copies/mL. A negative result does not preclude SARS-Cov-2 infection and should not be used as the sole basis for treatment or other patient management decisions. A negative result may occur with  improper specimen collection/handling, submission of specimen other than nasopharyngeal swab, presence of viral mutation(s) within the areas targeted by this assay, and inadequate number of  viral copies(<138 copies/mL). A negative result must be combined with clinical observations, patient history, and epidemiological information. The expected result is Negative.  Fact Sheet for Patients:  EntrepreneurPulse.com.au  Fact Sheet for Healthcare Providers:  IncredibleEmployment.be  This test is no t yet approved or cleared by the [place] FDA and  has been authorized for detection and/or diagnosis of SARS-CoV-2 by FDA under an Emergency Use Authorization (EUA). This EUA will remain  in effect (meaning this test can be used) for the duration of the COVID-19 declaration under Section 564(b)(1) of the Act, 21 U.S.C.section 360bbb-3(b)(1), unless the authorization is terminated  or revoked sooner.       Influenza A by PCR NEGATIVE NEGATIVE Final   Influenza B by PCR NEGATIVE NEGATIVE Final    Comment: (NOTE) The Xpert Xpress SARS-CoV-2/FLU/RSV plus assay is intended as an aid in the diagnosis of influenza from Nasopharyngeal swab specimens and should not be used as a sole basis for treatment. Nasal washings and aspirates are unacceptable for Xpert Xpress SARS-CoV-2/FLU/RSV testing.  Fact Sheet for Patients: EntrepreneurPulse.com.au  Fact Sheet for Healthcare Providers: IncredibleEmployment.be  This test is not yet approved or cleared by the [place] FDA and has been authorized for detection and/or diagnosis of SARS-CoV-2 by FDA under an Emergency Use Authorization (EUA). This EUA will remain in effect (meaning this test can be used) for the duration of the COVID-19 declaration under Section 564(b)(1) of the Act, 21 U.S.C. section 360bbb-3(b)(1), unless the authorization is terminated or revoked.  Performed at [facility], [place] [address], [place], [place] [address]      Labs: BNP (last 3 results) No results for input(s): BNP in the last 8760 hours. Basic Metabolic  Panel: Recent Labs  Lab 07/22/21 0401 07/23/21 0333 07/25/21 0706 07/27/21 0310  NA 137 137 138 137  K 4.1 4.1 4.1 3.7  CL 103 103 101 101  CO2 '26 26 30 29  '$ GLUCOSE 89 75 83 91  BUN 7* 6* 7* 5*  CREATININE 0.52* 0.52* 0.50* 0.45*  CALCIUM 8.6* 8.6* 8.6* 8.4*  MG 1.5* 1.5* 1.5* 1.5*  PHOS 2.5  --   --   --    Liver Function Tests: No results for input(s): AST, ALT, ALKPHOS, BILITOT, PROT, ALBUMIN in the last 168 hours. No results for input(s): LIPASE, AMYLASE in the last 168 hours. No results for input(s): AMMONIA in the last 168 hours. CBC: Recent Labs  Lab 07/22/21 0401 07/25/21 0706  WBC 11.7* 7.6  NEUTROABS  --  4.5  HGB 11.3* 11.0*  HCT 34.4* 33.9*  MCV 93.2 95.5  PLT 264 266   Cardiac Enzymes: No results for input(s): CKTOTAL, CKMB, CKMBINDEX, TROPONINI in the last 168 hours. BNP: Invalid input(s): POCBNP CBG: Recent Labs  Lab 07/27/21 1820 07/28/21 0049 07/28/21 0635 07/28/21 0701 07/28/21 1209  GLUCAP 75 90 68* 94 72   D-Dimer No results for input(s): DDIMER in  the last 72 hours. Hgb A1c No results for input(s): HGBA1C in the last 72 hours. Lipid Profile No results for input(s): CHOL, HDL, LDLCALC, TRIG, CHOLHDL, LDLDIRECT in the last 72 hours. Thyroid function studies No results for input(s): TSH, T4TOTAL, T3FREE, THYROIDAB in the last 72 hours.  Invalid input(s): FREET3 Anemia work up No results for input(s): VITAMINB12, FOLATE, FERRITIN, TIBC, IRON, RETICCTPCT in the last 72 hours. Urinalysis    Component Value Date/Time   COLORURINE YELLOW 07/17/2021 0935   APPEARANCEUR CLEAR 07/17/2021 0935   LABSPEC 1.015 07/17/2021 0935   PHURINE 8.0 07/17/2021 0935   GLUCOSEU NEGATIVE 07/17/2021 0935   HGBUR NEGATIVE 07/17/2021 0935   HGBUR negative 05/29/2008 1402   BILIRUBINUR NEGATIVE 07/17/2021 0935   KETONESUR NEGATIVE 07/17/2021 0935   PROTEINUR NEGATIVE 07/17/2021 0935   UROBILINOGEN 2.0 (H) 09/17/2020 1417   NITRITE NEGATIVE 07/17/2021  0935   LEUKOCYTESUR NEGATIVE 07/17/2021 0935   Sepsis Labs Invalid input(s): PROCALCITONIN,  WBC,  LACTICIDVEN Microbiology Recent Results (from the past 240 hour(s))  Resp Panel by RT-PCR (Flu A&B, Covid) Nasopharyngeal Swab     Status: None   Collection Time: 07/27/21  1:14 PM   Specimen: Nasopharyngeal Swab; Nasopharyngeal(NP) swabs in vial transport medium  Result Value Ref Range Status   SARS Coronavirus 2 by RT PCR NEGATIVE NEGATIVE Final    Comment: (NOTE) SARS-CoV-2 target nucleic acids are NOT DETECTED.  The SARS-CoV-2 RNA is generally detectable in upper respiratory specimens during the acute phase of infection. The lowest concentration of SARS-CoV-2 viral copies this assay can detect is 138 copies/mL. A negative result does not preclude SARS-Cov-2 infection and should not be used as the sole basis for treatment or other patient management decisions. A negative result may occur with  improper specimen collection/handling, submission of specimen other than nasopharyngeal swab, presence of viral mutation(s) within the areas targeted by this assay, and inadequate number of viral copies(<138 copies/mL). A negative result must be combined with clinical observations, patient history, and epidemiological information. The expected result is Negative.  Fact Sheet for Patients:  EntrepreneurPulse.com.au  Fact Sheet for Healthcare Providers:  IncredibleEmployment.be  This test is no t yet approved or cleared by the [place] FDA and  has been authorized for detection and/or diagnosis of SARS-CoV-2 by FDA under an Emergency Use Authorization (EUA). This EUA will remain  in effect (meaning this test can be used) for the duration of the COVID-19 declaration under Section 564(b)(1) of the Act, 21 U.S.C.section 360bbb-3(b)(1), unless the authorization is terminated  or revoked sooner.       Influenza A by PCR NEGATIVE NEGATIVE Final    Influenza B by PCR NEGATIVE NEGATIVE Final    Comment: (NOTE) The Xpert Xpress SARS-CoV-2/FLU/RSV plus assay is intended as an aid in the diagnosis of influenza from Nasopharyngeal swab specimens and should not be used as a sole basis for treatment. Nasal washings and aspirates are unacceptable for Xpert Xpress SARS-CoV-2/FLU/RSV testing.  Fact Sheet for Patients: EntrepreneurPulse.com.au  Fact Sheet for Healthcare Providers: IncredibleEmployment.be  This test is not yet approved or cleared by the [place] FDA and has been authorized for detection and/or diagnosis of SARS-CoV-2 by FDA under an Emergency Use Authorization (EUA). This EUA will remain in effect (meaning this test can be used) for the duration of the COVID-19 declaration under Section 564(b)(1) of the Act, 21 U.S.C. section 360bbb-3(b)(1), unless the authorization is terminated or revoked.  Performed at [facility], [place],  [place] [address]     Please note: You were cared for by a hospitalist during your hospital stay. Once you are discharged, your primary care physician will handle any further medical issues. Please note that NO REFILLS for any discharge medications will be authorized once you are discharged, as it is imperative that you return to your primary care physician (or establish a relationship with a primary care physician if you do not have one) for your post hospital discharge needs so that they can reassess your need for medications and monitor your lab values.    Time coordinating discharge: 40 minutes  SIGNED:   Shelly Coss, MD  [facility] 07/28/2021, 12:48 PM Pager [phone]  If 7PM-7AM, please contact night-coverage [email or web address] Password [email or web address]

## 2021-07-27 NOTE — Progress Notes (Signed)
Manufacturing engineer [facility])  Referral received for EOL care at [facility].  Patient did not respond to voice when I entered room. He is contracted. Lunch tray with small bites missing.  Contacted his sister, advised referral had been made. She voiced that she understood [employer] was for EOL but she said no MD had told her that he was < 2 weeks and requests that someone speak to her to answer further questions about his life expectancy and his QOL.  There is not a bed to offer at [facility], and not clear if the sister is fully in agreement.  [facility] will continue to follow.  Venia Carbon RN, BSN, [facility] Liaison

## 2021-07-27 NOTE — Plan of Care (Signed)

## 2021-07-28 LAB — GLUCOSE, CAPILLARY
Glucose-Capillary: 90 mg/dL (ref 70–99)
Glucose-Capillary: 68 mg/dL — ABNORMAL LOW (ref 70–99)
Glucose-Capillary: 94 mg/dL (ref 70–99)
Glucose-Capillary: 72 mg/dL (ref 70–99)

## 2021-07-28 NOTE — Progress Notes (Signed)
Palliative Medicine RN Note: Rec'd a call from [place] yesterday afternoon asking for [place] to follow up with Mr Albrecht today, as his family is asking about hospice facility.  Before we had an available provider, I rec'd a call from [facility] with [place]. She reports that they have reviewed Mr Patak, and he is not appropriate for [facility]. They can support the family at home or at a nursing facility (if he is private pay/long term), but they cannot accept him to [facility]. Melissa will drop a note in the chart later.  At this time, there are no unmet palliative needs. Mr Lomanto requires placement at a facility, or his family can take him home with intermittent support from hospice; barrier to discharge seems to be that family does not like facility options. Our team will continue to shadow, but we have nothing further to offer the family, option-wise.  Marjie Skiff Hadriel Northup, RN, BSN, [facility] Palliative Medicine Team 07/28/2021 1:07 PM Office [phone]

## 2021-07-28 NOTE — Progress Notes (Addendum)
Manufacturing engineer [facility])  Referral originally received for [facility] but since has changed to discharge to [place] with our hospice services. Patient chart under review at this time.  Visited patient at bedside, he was alert and oriented x3 to person, place, and at times to situation. Eating few bites of meals when fed, drinking ensures and sips of drink at times during the day. He is very contracted but is in no pain. Also spoke to sister who is agreeable to hospice within [place].   Please send signed and completed DNR form home with patient/family. Patient will need prescriptions for discharge comfort medications.  [facility] aware of the above. Please notify [facility] when patient is ready to leave the unit at discharge. (Call [phone] or [phone] after 5pm.) [facility] information and contact numbers given to [name].      Please call with any hospice related questions.     Thank you for this referral.     Clementeen Hoof, BSN, [facility] (listed on AMION under [facility]ospice [facility] [place])  [phone]

## 2021-07-28 NOTE — Consult Note (Signed)
[place] Nurse wound follow up Patient receiving care in [facility] 3W19. From review of the chart I see the patient is to be transferred to the services of Hospice Care at [facility] for EOL needs. [facility] will not follow and I am signing off our service. Please re-consult the [place] team if needed.  Val Riles, RN, MSN, CWOCN, CNS-BC, pager [phone]

## 2021-07-28 NOTE — Progress Notes (Addendum)
Patient seen and examined at the bedside this morning.  Hemodynamically stable, same like yesterday.  Lying in bed, barely awake, has poor oral intake, very deconditioned.  Patient has already been discharged yesterday.  Discharge orders and summary are in.  Sister was interested to place him in residential hospice.  Hospice agency is meeting with her today. I talked to her today on phone. I agree with the residential hospice idea.  He has very poor quality of life and his life expectancy is very limited.  No change in the medical management.

## 2021-07-28 NOTE — Progress Notes (Signed)
Pt safely discharged with transport back to [place]. Report given to [employer]. All questions and concerns addressed.

## 2021-07-28 NOTE — TOC Transition Note (Signed)
Transition of Care [facility]) - CM/SW Discharge Note   Patient Details  Name: MERIT HERPEL MRN: [id number] Date of Birth: 06-06-1946  Transition of Care [facility]) CM/SW Contact:  Geralynn Ochs, LCSW Phone Number: 07/28/2021, 1:38 PM   Clinical Narrative:   Nurse to call report to [phone].  CSW coordinated with [facility] liaison that patient is not Optometrist appropriate at this time, but is hospice appropriate and they can follow at SNF if sister is interested. CSW spoke with [name] and explained that hospice can be additional support for the patient to ensure that he's comfortable, and Oren Binet is appreciative of having hospice assistance at [facility]. [name] would be interested in transition to [facility] when patient becomes appropriate, CSW updated hospice liaison. [facility] can accept patient back today. No other needs at this time.    Final next level of care: Skilled Nursing Facility Barriers to Discharge: Barriers Resolved   Patient Goals and CMS Choice   CMS Medicare.gov Compare Post Acute Care list provided to:: Patient Represenative (must comment) Choice offered to / list presented to : Sibling  Discharge Placement              Patient chooses bed at:  ([facility]) Patient to be transferred to facility by: [place] Name of family member notified: [name] Patient and family notified of of transfer: 07/28/21  Discharge Plan and Services In-house Referral: Clinical Social Work Discharge Planning Services: [employer] Consult Post Acute Care Choice: [place]                               Social Determinants of Health (SDOH) Interventions     Readmission Risk Interventions No flowsheet data found.

## 2021-07-28 NOTE — TOC Progression Note (Signed)
Transition of Care [facility]) - Progression Note    Patient Details  Name: Brad [name] MRN: [id number] Date of Birth: May 16, 1946  Transition of Care [facility]) CM/SW [place], [place] Phone Number: 07/28/2021, 1:35 PM  Clinical Narrative:   CSW following for discharge to SNF. CSW spoke with sister, she asked about transitioning to [place] instead of [place]. CSW spoke with [facility] admissions, and they are unable to admit the patient as he has a bill at [employer], so that will need to be settled first. CSW spoke with sister and discussed barrier, and she indicated that she really didn't want him to return to [place] but understood that was the only option. Sister asked about transition to hospice instead, if that would still be an option. CSW sent referral to [facility] to review for hospice admission. Hospice liaison discussed with CSW, and asked for palliative to meet with sister again to clarify goals and prognosis. CSW reached out to palliative medicine team, and asked if someone could follow up with sister again today to discuss. CSW updated MD on plan, and will hold discharge to see if patient qualifies for hospice house. CSW updated [facility] that patient will not admit today. CSW to follow.    Expected Discharge Plan: [place] Barriers to Discharge: Continued Medical Work up  Expected Discharge Plan and Services Expected Discharge Plan: [place] In-house Referral: Clinical Social Work Discharge Planning Services: CM Consult Post Acute Care Choice: [place] Living arrangements for the past 2 months: [place] Expected Discharge Date: 07/27/21                                     Social Determinants of Health (SDOH) Interventions    Readmission Risk Interventions No flowsheet data found.

## 2021-08-20 IMAGING — CT CT HEAD W/O CM
4 series · 16 of 47 positions shown, 18 images · non-contrast
Comparison: MR cervical spine 02/22/2019, CT head 12/04/2020

CLINICAL DATA: Neck trauma.

EXAM:
CT HEAD WITHOUT CONTRAST
CT CERVICAL SPINE WITHOUT CONTRAST
TECHNIQUE: Multidetector CT imaging of the head and cervical spine was
performed following the standard protocol without intravenous
contrast. Multiplanar CT image reconstructions of the cervical spine
were also generated.

[Series 3: head bone · axial · 0.43mm/px · z∈[-126,-94]mm · 3 of 80 slices shown]
[im 8/80  bone]
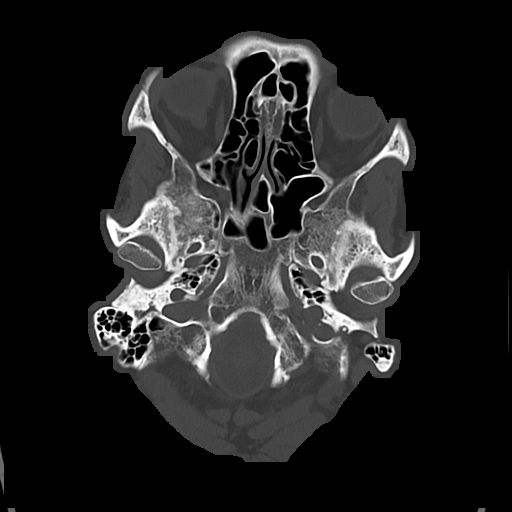
[im 16/80  bone]
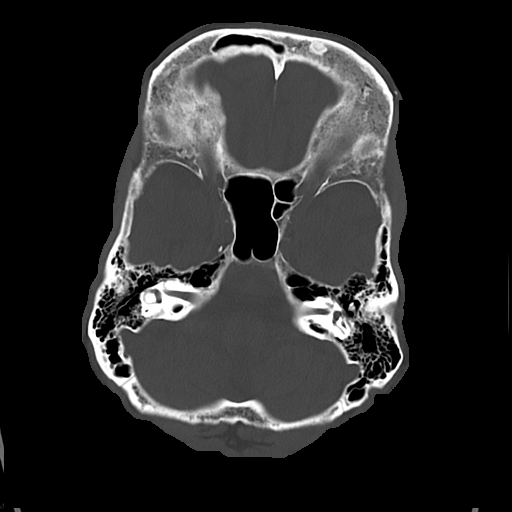
[im 24/80  bone]
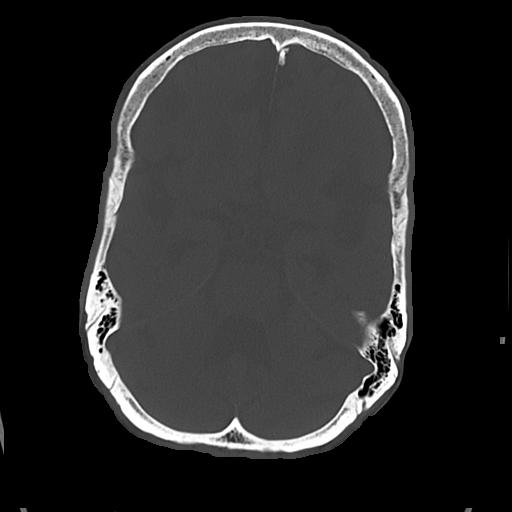

[Series 4: head wo · axial · 0.43mm/px · z∈[-125,-5]mm · 7 of 32 slices shown, 9 images]
[im 4/32  brain]
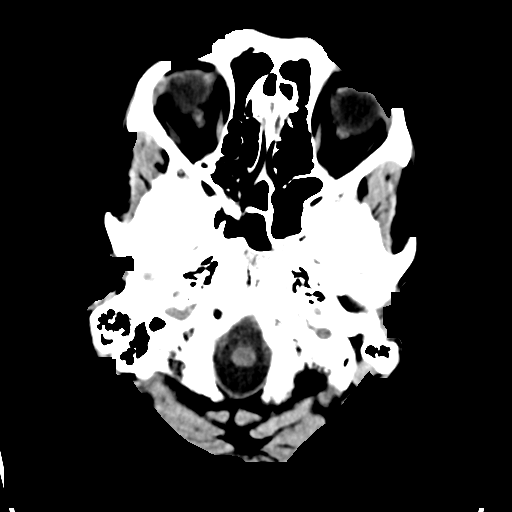
[im 4/32  bone]
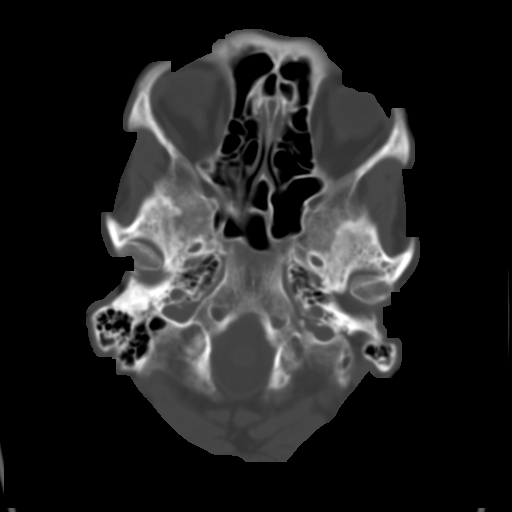
[im 8/32  brain]
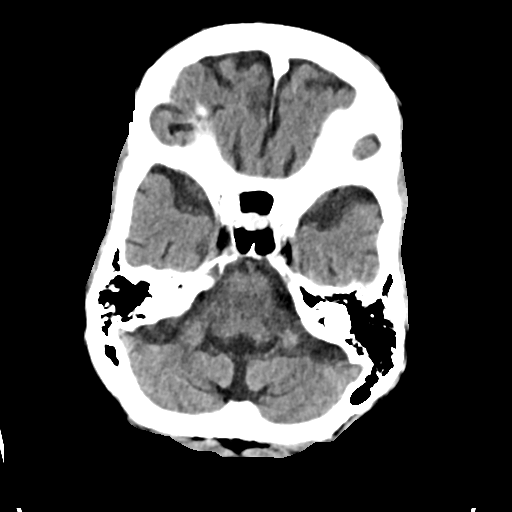
[im 12/32  brain]
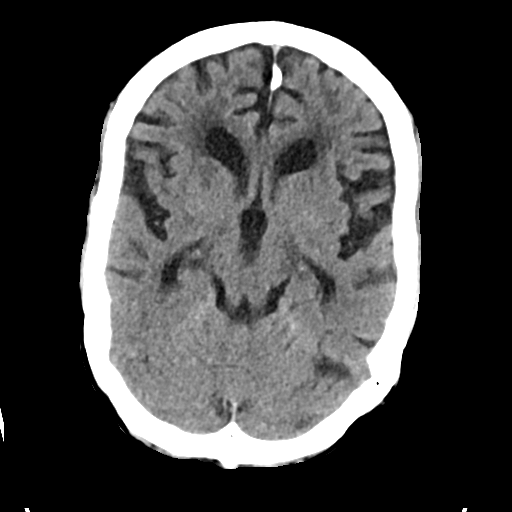
[im 16/32  brain]
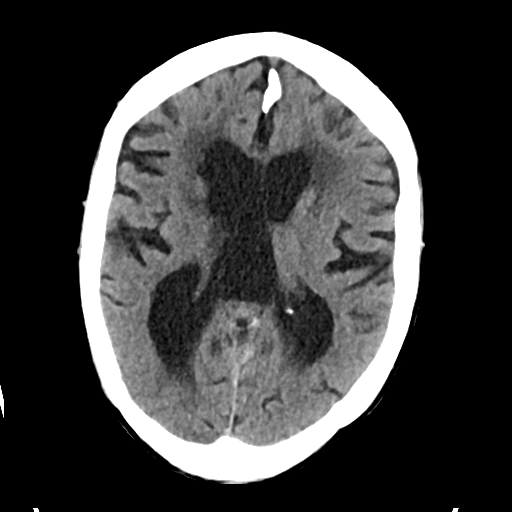
[im 20/32  brain]
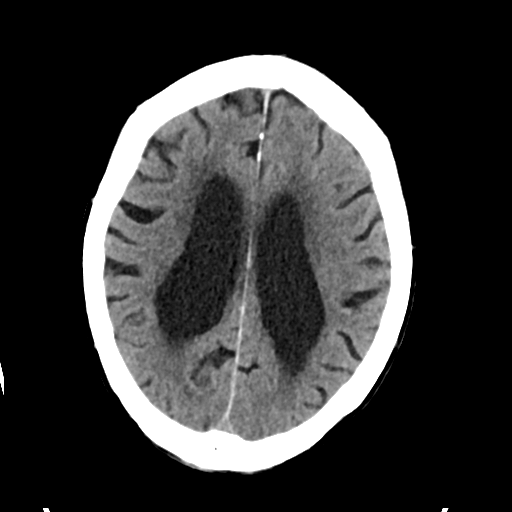
[im 20/32  bone]
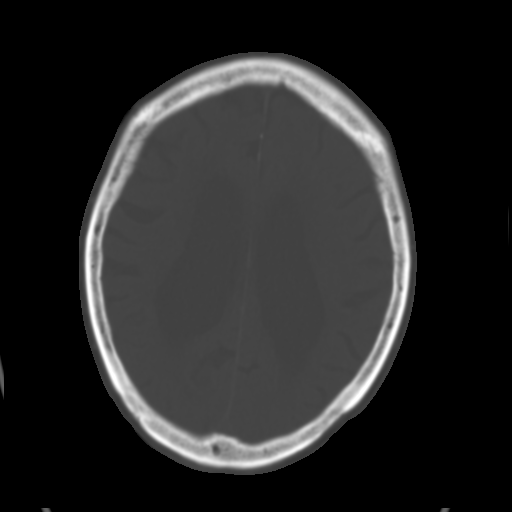
[im 24/32  brain]
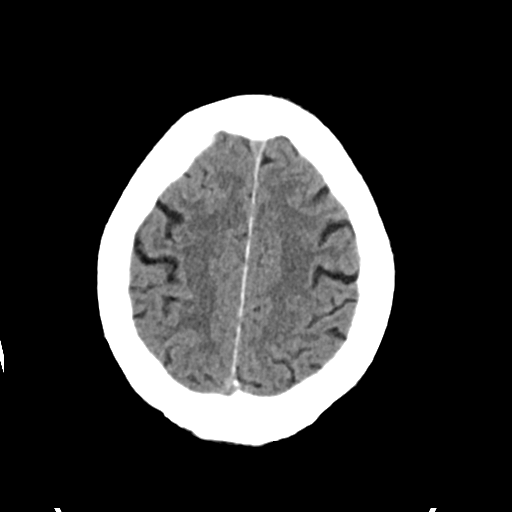
[im 28/32  brain]
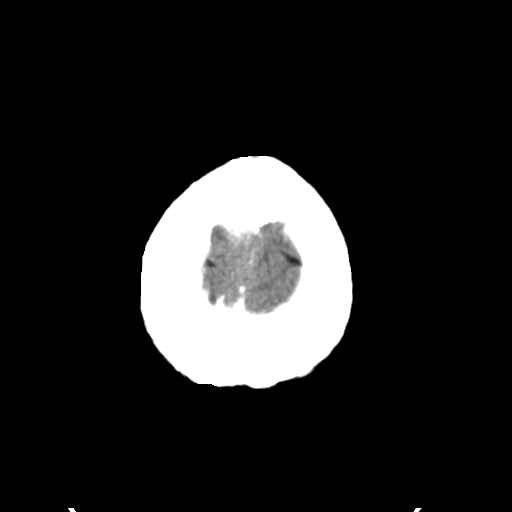

[Series 5: cor soft · coronal · 0.34mm/px · 3 of 72 slices shown]
[im 24/72  brain]
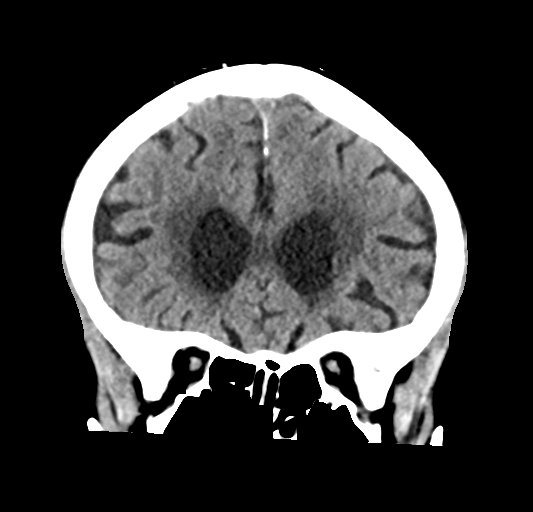
[im 32/72  brain]
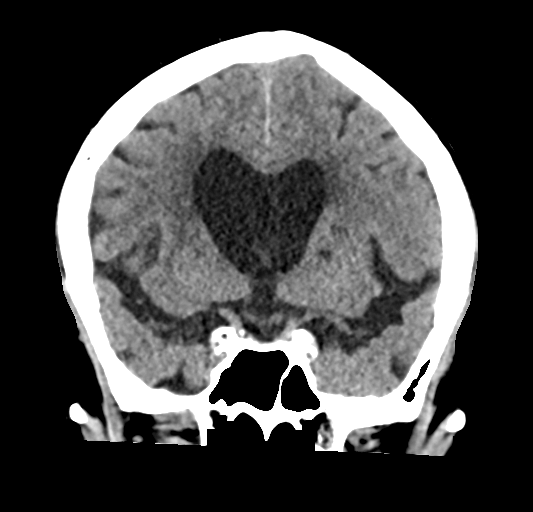
[im 40/72  brain]
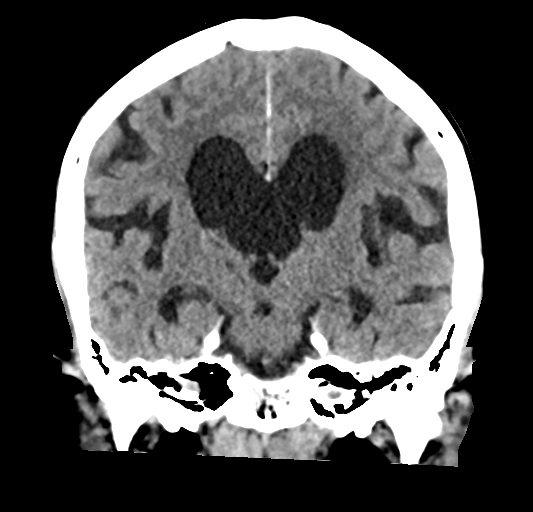

[Series 6: sag soft · sagittal · 0.34mm/px · 3 of 51 slices shown]
[im 17/51  brain]
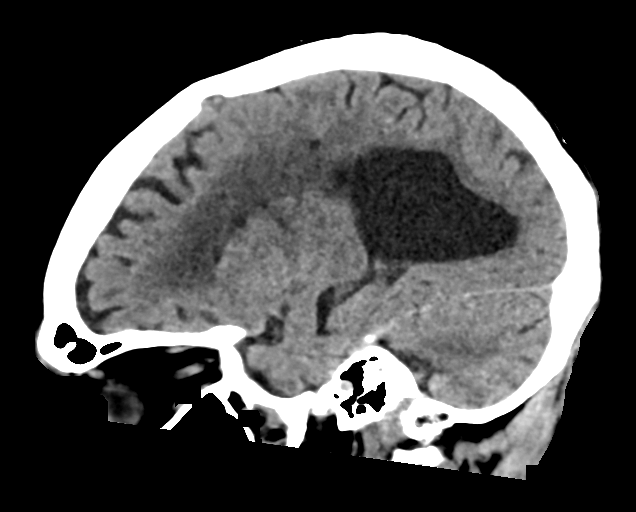
[im 26/51  brain]
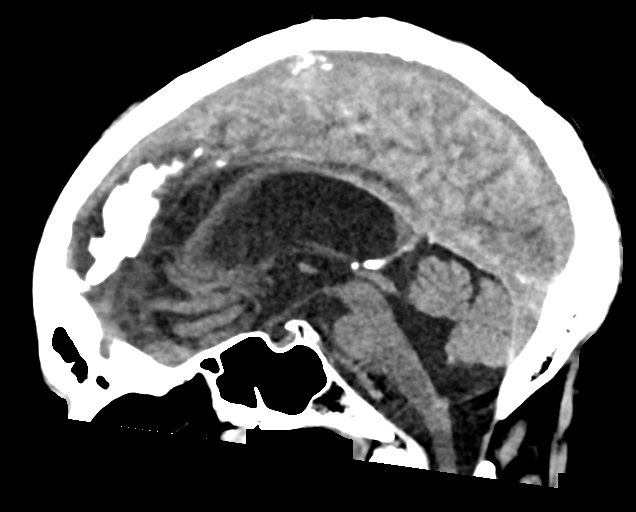
[im 34/51  brain]
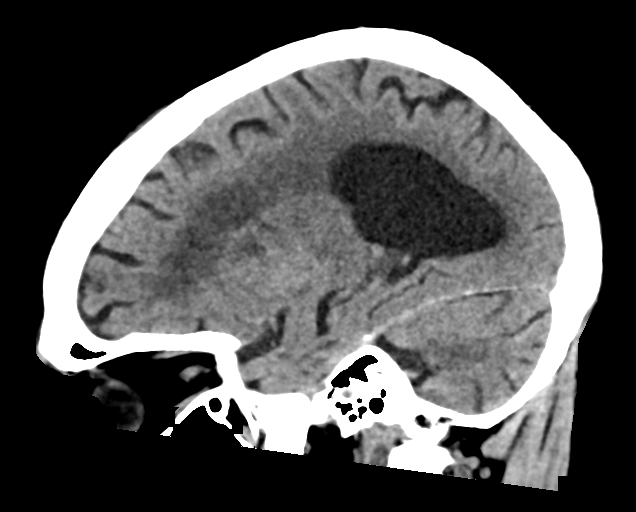

[16 of 47 positions shown; findings below may reference images not displayed]

FINDINGS: CT HEAD FINDINGS

Brain:

Stable prominence of the lateral ventricles may be related to
central predominant atrophy, although a component of normal
pressure/communicating hydrocephalus cannot be excluded.

Patchy and confluent areas of decreased attenuation are noted
throughout the deep and periventricular white matter of the cerebral
hemispheres bilaterally, compatible with chronic microvascular
ischemic disease.

No evidence of large-territorial acute infarction. No parenchymal
hemorrhage. No mass lesion. No extra-axial collection.

No mass effect or midline shift. No hydrocephalus. Basilar cisterns
are patent.

Vascular: No hyperdense vessel.

Skull: No acute fracture or focal lesion.

Sinuses/Orbits: Paranasal sinuses and mastoid air cells are clear.
The orbits are unremarkable.

Other: None.

CT CERVICAL SPINE FINDINGS

Alignment: Normal.

Skull base and vertebrae: Anterior fusion at the C3 through C6
levels. No CT findings to suggest surgical hardware complication.
Multilevel degenerative changes. No acute fracture. No aggressive
appearing focal osseous lesion or focal pathologic process.

Soft tissues and spinal canal: No prevertebral fluid or swelling. No
visible canal hematoma.

Upper chest: Trace biapical pleural/pulmonary scarring.

Other: Severe atherosclerotic plaque of the left carotid arteries in
the neck.
IMPRESSION: 1. No acute intracranial abnormality, in a patient with cerebral
atrophy although a component of normal pressure/communicating
hydrocephalus cannot be excluded.
2. No acute displaced fracture or traumatic listhesis of the
cervical spine in a patient status post anterior fusion at the C3
through C6 levels.
3. Severe atherosclerotic plaque of the left carotid arteries in the
neck. Recommend nonemergent ultrasound carotid arteries.

## 2021-09-17 DEATH — deceased

## 2021-10-26 ENCOUNTER — Encounter: Payer: Self-pay | Admitting: Neurology

## 2021-10-26 ENCOUNTER — Telehealth: Payer: Self-pay | Admitting: *Deleted

## 2021-10-26 ENCOUNTER — Ambulatory Visit: Payer: Self-pay | Admitting: Neurology

## 2021-10-26 NOTE — Telephone Encounter (Signed)
Pt no showed appt today
